# Patient Record
Sex: Female | Born: 1940
Health system: Southern US, Community
[De-identification: ages and names within clinical notes are randomized; demographics above are authoritative.]

## PROBLEM LIST (undated history)

## (undated) DIAGNOSIS — B019 Varicella without complication: Secondary | ICD-10-CM

## (undated) DIAGNOSIS — F411 Generalized anxiety disorder: Secondary | ICD-10-CM

## (undated) DIAGNOSIS — M329 Systemic lupus erythematosus, unspecified: Secondary | ICD-10-CM

## (undated) DIAGNOSIS — M1990 Unspecified osteoarthritis, unspecified site: Secondary | ICD-10-CM

## (undated) DIAGNOSIS — IMO0002 Reserved for concepts with insufficient information to code with codable children: Secondary | ICD-10-CM

## (undated) DIAGNOSIS — G43909 Migraine, unspecified, not intractable, without status migrainosus: Secondary | ICD-10-CM

## (undated) DIAGNOSIS — Z86718 Personal history of other venous thrombosis and embolism: Secondary | ICD-10-CM

## (undated) DIAGNOSIS — R7303 Prediabetes: Secondary | ICD-10-CM

## (undated) HISTORY — DX: Migraine, unspecified, not intractable, without status migrainosus: G43.909

## (undated) HISTORY — DX: Personal history of other venous thrombosis and embolism: Z86.718

## (undated) HISTORY — PX: CHOLECYSTECTOMY: SHX55

## (undated) HISTORY — DX: Varicella without complication: B01.9

## (undated) HISTORY — DX: Reserved for concepts with insufficient information to code with codable children: IMO0002

## (undated) HISTORY — DX: Generalized anxiety disorder: F41.1

## (undated) HISTORY — DX: Unspecified osteoarthritis, unspecified site: M19.90

## (undated) HISTORY — DX: Systemic lupus erythematosus, unspecified: M32.9

---

## 1971-03-09 HISTORY — PX: DILATION AND CURETTAGE OF UTERUS: SHX78

## 1972-03-08 HISTORY — PX: GALLBLADDER SURGERY: SHX652

## 1998-01-06 ENCOUNTER — Other Ambulatory Visit: Admission: RE | Admit: 1998-01-06 | Discharge: 1998-01-06 | Payer: Self-pay | Admitting: Obstetrics & Gynecology

## 1998-01-17 ENCOUNTER — Ambulatory Visit: Admission: RE | Admit: 1998-01-17 | Discharge: 1998-01-17 | Payer: Self-pay | Admitting: Internal Medicine

## 1998-03-08 HISTORY — PX: BACK SURGERY: SHX140

## 1998-04-08 ENCOUNTER — Ambulatory Visit: Admission: RE | Admit: 1998-04-08 | Discharge: 1998-04-09 | Payer: Self-pay | Admitting: Obstetrics & Gynecology

## 1998-07-01 ENCOUNTER — Other Ambulatory Visit: Admission: RE | Admit: 1998-07-01 | Discharge: 1998-07-01 | Payer: Self-pay | Admitting: Obstetrics & Gynecology

## 1998-12-22 ENCOUNTER — Ambulatory Visit: Admission: RE | Admit: 1998-12-22 | Discharge: 1998-12-22 | Payer: Self-pay | Admitting: Orthopaedic Surgery

## 1999-01-01 ENCOUNTER — Ambulatory Visit (HOSPITAL_COMMUNITY): Admission: RE | Admit: 1999-01-01 | Discharge: 1999-01-02 | Payer: Self-pay | Admitting: Orthopaedic Surgery

## 1999-04-07 ENCOUNTER — Other Ambulatory Visit: Admission: RE | Admit: 1999-04-07 | Discharge: 1999-04-07 | Payer: Self-pay | Admitting: Obstetrics & Gynecology

## 2000-05-23 ENCOUNTER — Other Ambulatory Visit: Admission: RE | Admit: 2000-05-23 | Discharge: 2000-05-23 | Payer: Self-pay | Admitting: Obstetrics & Gynecology

## 2001-07-06 ENCOUNTER — Encounter: Admission: RE | Admit: 2001-07-06 | Discharge: 2001-07-06 | Payer: Self-pay | Admitting: Internal Medicine

## 2001-07-06 ENCOUNTER — Encounter: Payer: Self-pay | Admitting: Internal Medicine

## 2001-07-13 ENCOUNTER — Encounter: Payer: Self-pay | Admitting: Internal Medicine

## 2001-07-13 ENCOUNTER — Encounter: Admission: RE | Admit: 2001-07-13 | Discharge: 2001-07-13 | Payer: Self-pay | Admitting: Internal Medicine

## 2001-10-27 ENCOUNTER — Other Ambulatory Visit: Admission: RE | Admit: 2001-10-27 | Discharge: 2001-10-27 | Payer: Self-pay | Admitting: Obstetrics & Gynecology

## 2002-01-01 ENCOUNTER — Encounter: Payer: Self-pay | Admitting: Rheumatology

## 2002-01-01 ENCOUNTER — Encounter: Admission: RE | Admit: 2002-01-01 | Discharge: 2002-01-01 | Payer: Self-pay | Admitting: Rheumatology

## 2002-04-30 ENCOUNTER — Emergency Department (HOSPITAL_COMMUNITY): Admission: EM | Admit: 2002-04-30 | Discharge: 2002-04-30 | Payer: Self-pay | Admitting: Emergency Medicine

## 2002-08-09 ENCOUNTER — Encounter: Admission: RE | Admit: 2002-08-09 | Discharge: 2002-08-09 | Payer: Self-pay | Admitting: Internal Medicine

## 2002-08-09 ENCOUNTER — Encounter: Payer: Self-pay | Admitting: Internal Medicine

## 2003-02-20 ENCOUNTER — Ambulatory Visit (HOSPITAL_COMMUNITY): Admission: RE | Admit: 2003-02-20 | Discharge: 2003-02-20 | Payer: Self-pay | Admitting: Gastroenterology

## 2003-03-21 ENCOUNTER — Other Ambulatory Visit: Admission: RE | Admit: 2003-03-21 | Discharge: 2003-03-21 | Payer: Self-pay | Admitting: Obstetrics & Gynecology

## 2004-08-11 ENCOUNTER — Other Ambulatory Visit: Admission: RE | Admit: 2004-08-11 | Discharge: 2004-08-11 | Payer: Self-pay | Admitting: Obstetrics & Gynecology

## 2004-08-14 ENCOUNTER — Ambulatory Visit: Payer: Self-pay | Admitting: Gastroenterology

## 2005-11-18 ENCOUNTER — Encounter: Admission: RE | Admit: 2005-11-18 | Discharge: 2005-11-18 | Payer: Self-pay | Admitting: Obstetrics & Gynecology

## 2005-11-25 ENCOUNTER — Ambulatory Visit: Payer: Self-pay | Admitting: Gastroenterology

## 2006-08-19 ENCOUNTER — Encounter: Admission: RE | Admit: 2006-08-19 | Discharge: 2006-08-19 | Payer: Self-pay | Admitting: *Deleted

## 2006-11-25 ENCOUNTER — Encounter: Admission: RE | Admit: 2006-11-25 | Discharge: 2006-11-25 | Payer: Self-pay | Admitting: Obstetrics & Gynecology

## 2007-12-11 ENCOUNTER — Encounter: Admission: RE | Admit: 2007-12-11 | Discharge: 2007-12-11 | Payer: Self-pay | Admitting: Obstetrics & Gynecology

## 2007-12-15 ENCOUNTER — Encounter: Admission: RE | Admit: 2007-12-15 | Discharge: 2007-12-15 | Payer: Self-pay | Admitting: Obstetrics & Gynecology

## 2007-12-20 ENCOUNTER — Emergency Department (HOSPITAL_COMMUNITY): Admission: EM | Admit: 2007-12-20 | Discharge: 2007-12-20 | Payer: Self-pay | Admitting: Internal Medicine

## 2009-01-20 ENCOUNTER — Encounter: Admission: RE | Admit: 2009-01-20 | Discharge: 2009-01-20 | Payer: Self-pay | Admitting: Obstetrics & Gynecology

## 2010-03-29 ENCOUNTER — Encounter: Payer: Self-pay | Admitting: Family Medicine

## 2010-03-29 ENCOUNTER — Encounter: Payer: Self-pay | Admitting: Obstetrics & Gynecology

## 2010-03-31 ENCOUNTER — Encounter
Admission: RE | Admit: 2010-03-31 | Discharge: 2010-03-31 | Payer: Self-pay | Source: Home / Self Care | Attending: Internal Medicine | Admitting: Internal Medicine

## 2010-04-01 ENCOUNTER — Ambulatory Visit: Admit: 2010-04-01 | Payer: Self-pay | Admitting: Family Medicine

## 2010-07-24 NOTE — Assessment & Plan Note (Signed)
[facility]                           GASTROENTEROLOGY OFFICE NOTE   Tara Martin, Tara Martin                     MRN:          [id number]  DATE:11/25/2005                            DOB:          13-Oct-1940    Tara Martin has gastroparesis which requires p.r.n. Reglan 5 mg before meals  and at bedtime use. She is doing extremely well and has no GI or general  medical complaints at this time. She is up to date on her colonoscopy and  does not need this done for at least 7-8 more years.  She is maintaining her  weight normally and does follow a gastroparesis diet.   Her weight today was 147 pounds.  Her blood pressure is 118/62.  Pulse was  72 and regular.   A general physical exam was not performed.   IMPRESSION:  Idiopathic gastroparesis well-controlled with p.r.n. prokinetic  therapy.  The patient is status post cholecystectomy.  Her gastric emptying  scan was last done in December 2004.   RECOMMENDATION:  Gastroparesis diet reviewed and Reglan renewed.  We will  see her on a p.r.n. basis as needed.  She has regular checkup and lab data  per Dr. Darius Bump.                                   Tara Rea. Jarold Motto, MD, [place], [place]   DRP/MedQ  DD:  11/25/2005  DT:  11/26/2005  Job #:  [id number]   cc:   Darius Bump, M.D.

## 2010-08-14 ENCOUNTER — Emergency Department (HOSPITAL_BASED_OUTPATIENT_CLINIC_OR_DEPARTMENT_OTHER)
Admission: EM | Admit: 2010-08-14 | Discharge: 2010-08-15 | Discharge status: Against Medical Advice | Payer: Medicare Other | Attending: Emergency Medicine | Admitting: Emergency Medicine

## 2010-08-14 DIAGNOSIS — R51 Headache: Secondary | ICD-10-CM | POA: Insufficient documentation

## 2010-08-20 ENCOUNTER — Inpatient Hospital Stay (INDEPENDENT_AMBULATORY_CARE_PROVIDER_SITE_OTHER)
Admission: RE | Admit: 2010-08-20 | Discharge: 2010-08-20 | Disposition: A | Discharge status: Home / Self Care | Payer: Medicare Other | Source: Ambulatory Visit | Attending: Emergency Medicine | Admitting: Emergency Medicine

## 2010-08-20 DIAGNOSIS — S0990XA Unspecified injury of head, initial encounter: Secondary | ICD-10-CM

## 2010-10-23 ENCOUNTER — Other Ambulatory Visit: Payer: Self-pay | Admitting: Family Medicine

## 2010-10-23 DIAGNOSIS — R51 Headache: Secondary | ICD-10-CM

## 2010-10-27 ENCOUNTER — Inpatient Hospital Stay: Admission: RE | Admit: 2010-10-27 | Payer: Medicare Other | Source: Ambulatory Visit

## 2010-10-27 ENCOUNTER — Ambulatory Visit
Admission: RE | Admit: 2010-10-27 | Discharge: 2010-10-27 | Disposition: A | Discharge status: Home / Self Care | Payer: PRIVATE HEALTH INSURANCE | Source: Ambulatory Visit | Attending: Family Medicine | Admitting: Family Medicine

## 2010-10-27 DIAGNOSIS — R51 Headache: Secondary | ICD-10-CM

## 2010-12-08 LAB — CBC
HCT: 38.8
MCHC: 34
MCV: 92
RBC: 4.21
RDW: 12.4
WBC: 4

## 2010-12-08 LAB — D-DIMER, QUANTITATIVE: D-Dimer, Quant: 0.29

## 2010-12-08 LAB — DIFFERENTIAL
Basophils Relative: 1
Eosinophils Relative: 3
Lymphocytes Relative: 31

## 2010-12-08 LAB — BASIC METABOLIC PANEL
BUN: 12
CO2: 22
Calcium: 9
GFR calc non Af Amer: >60
Glucose, Bld: 101 — ABNORMAL HIGH
Sodium: 136

## 2010-12-08 LAB — POCT CARDIAC MARKERS
CKMB, poc: <1 — ABNORMAL LOW
Myoglobin, poc: 49.8

## 2012-03-09 LAB — HM MAMMOGRAPHY

## 2012-03-09 LAB — HM PAP SMEAR

## 2012-04-08 LAB — HM COLONOSCOPY

## 2013-04-09 ENCOUNTER — Encounter: Payer: Self-pay | Admitting: Family Medicine

## 2013-04-09 ENCOUNTER — Ambulatory Visit (INDEPENDENT_AMBULATORY_CARE_PROVIDER_SITE_OTHER): Payer: Medicare HMO | Admitting: Family Medicine

## 2013-04-09 ENCOUNTER — Other Ambulatory Visit: Payer: Self-pay

## 2013-04-09 VITALS — BP 110/82 | Temp 97.8°F | Ht 63.75 in | Wt 147.0 lb

## 2013-04-09 DIAGNOSIS — L723 Sebaceous cyst: Secondary | ICD-10-CM

## 2013-04-09 DIAGNOSIS — F411 Generalized anxiety disorder: Secondary | ICD-10-CM

## 2013-04-09 DIAGNOSIS — L720 Epidermal cyst: Secondary | ICD-10-CM

## 2013-04-09 DIAGNOSIS — R82998 Other abnormal findings in urine: Secondary | ICD-10-CM

## 2013-04-09 DIAGNOSIS — Z7189 Other specified counseling: Secondary | ICD-10-CM

## 2013-04-09 DIAGNOSIS — R8290 Unspecified abnormal findings in urine: Secondary | ICD-10-CM

## 2013-04-09 DIAGNOSIS — Z7689 Persons encountering health services in other specified circumstances: Secondary | ICD-10-CM

## 2013-04-09 DIAGNOSIS — M329 Systemic lupus erythematosus, unspecified: Secondary | ICD-10-CM

## 2013-04-09 DIAGNOSIS — IMO0002 Reserved for concepts with insufficient information to code with codable children: Secondary | ICD-10-CM

## 2013-04-09 HISTORY — DX: Generalized anxiety disorder: F41.1

## 2013-04-09 MED ORDER — DOXYCYCLINE HYCLATE 100 MG PO TABS: 100.0000 mg | ORAL_TABLET | Freq: Two times a day (BID) | ORAL | Status: DC

## 2013-04-09 NOTE — Progress Notes (Addendum)
Chief Complaint  Patient presents with  . Establish Care  . lump on shoulder  . Urinary Tract Infection    HPI:  Tara Martin is here to establish care. Her prior PCP left [facility]. Last PCP and physical: 1 year ago  Bump on L shoulder: -[facility] at [facility] dermatology -started about 3 weeks ago, mild itching and pain  ? UTI: -had labs at rheum and told to recheck urine because had leuks and may have infection  Has the following chronic problems and concerns today:  Patient Active Problem List   Diagnosis Date Noted  . Lupus - followed by rheum 04/09/2013  . Anxiety state, unspecified - sees Dr. Nori Riis in gyn and benzo prescribed there 04/09/2013   ROS: negative for report of fevers, unintentional weight loss, vision changes, vision loss, hearing loss or change, chest pain, sob, hemoptysis, melena, hematochezia, hematuria, genital discharge or lesions, falls, bleeding or bruising, loc, thoughts of suicide or self harm, memory loss   Past Medical History  Diagnosis Date  . Chicken pox   . Arthritis   . Lupus   . Migraines     Family History  Problem Relation Age of Onset  . Alcoholism    . Arthritis Mother   . Arthritis    . Hypertension    . Sudden death    . Diabetes Mother     History   Social History  . Marital Status: Married    Spouse Name: N/A    Number of Children: N/A  . Years of Education: N/A   Social History Main Topics  . Smoking status: Never Smoker   . Smokeless tobacco: None  . Alcohol Use: Yes     Comment: wine occ   . Drug Use: None  . Sexual Activity: None   Other Topics Concern  . 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 Elease Hashimoto      Spiritual Beliefs:       Lifestyle: no regular exercise             Current outpatient prescriptions:ALPRAZolam (XANAX) 0.5 MG tablet, 1/2 tablet prn anxiety, Disp: , Rfl: ;  diclofenac sodium (VOLTAREN) 1  % GEL, Apply topically 4 (four) times daily., Disp: , Rfl: ;  FINACEA 15 % cream, , Disp: , Rfl: ;  hydroxychloroquine (PLAQUENIL) 200 MG tablet, Take 200 mg by mouth daily., Disp: , Rfl: ;  doxycycline (VIBRA-TABS) 100 MG tablet, Take 1 tablet (100 mg total) by mouth 2 (two) times daily., Disp: 20 tablet, Rfl: 0  EXAM:  Filed Vitals:   04/09/13 1124  BP: 110/82  Temp: 97.8 F (36.6 C)    Body mass index is 25.44 kg/(m^2).  GENERAL: vitals reviewed and listed above, alert, oriented, appears well hydrated and in no acute distress  HEENT: atraumatic, conjunttiva clear, no obvious abnormalities on inspection of external nose and ears  NECK: no obvious masses on inspection  LUNGS: clear to auscultation bilaterally, no wheezes, rales or rhonchi, good air movement  CV: HRRR, no peripheral edema  MS: moves all extremities without noticeable abnormality  SKIN: boil or cyst on back   PSYCH: pleasant and cooperative, no obvious depression or anxiety  ASSESSMENT AND PLAN:  Discussed the following assessment and plan:  Lupus - followed by rheum  Anxiety state, unspecified - sees Dr. Nori Riis in gyn and benzo prescribed there  Encounter to  establish care  Abnormal urinalysis - Plan: Culture, Urine  Epidermoid cyst - Plan: doxycycline (VIBRA-TABS) 100 MG tablet   -We reviewed the PMH, PSH, FH, SH, Meds and Allergies. -We provided refills for any medications we will prescribe as needed. -We addressed current concerns per orders and patient instructions. -We have asked for records for pertinent exams, studies, vaccines and notes from previous providers. -We have advised patient to follow up per instructions below. -for what looks like an inflamed epidermoid cysts advised compresses, doxy in case of infection though less likely and follow up with her dermatologist if worsens or persists -for abnormal urine dip will get culture to ensure no infection -follow up for annual preventive exam     -Patient advised to return or notify a doctor immediately if symptoms worsen or persist or new concerns arise.  Patient Instructions  -We have ordered labs or studies at this visit. It can take up to 1-2 weeks for results and processing. We will contact you with instructions IF your results are abnormal. Normal results will be released to your [facility]. If you have not heard from [place] or can not find your results in [facility] in 2 weeks please contact our office.  -PLEASE SIGN UP FOR MYCHART TODAY   We recommend the following healthy lifestyle measures: - eat a healthy diet consisting of lots of vegetables, fruits, beans, nuts, seeds, healthy meats such as white chicken and fish and whole grains.  - avoid fried foods, fast food, processed foods, sodas, red meet and other fattening foods.  - get a least 150 minutes of aerobic exercise per week.   Use warm compresses twice daily on cyst and take antibiotic. If does not resolve or worsens see your dermatologist.  Follow up in: next several months for your medicare preventive exam      Lucretia Kern.

## 2013-04-09 NOTE — Progress Notes (Signed)
Pre visit review using our clinic review tool, if applicable. No additional management support is needed unless otherwise documented below in the visit note. 

## 2013-04-09 NOTE — Patient Instructions (Signed)
-  We have ordered labs or studies at this visit. It can take up to 1-2 weeks for results and processing. We will contact you with instructions IF your results are abnormal. Normal results will be released to your [facility]. If you have not heard from [place] or can not find your results in [facility] in 2 weeks please contact our office.  -PLEASE SIGN UP FOR MYCHART TODAY   We recommend the following healthy lifestyle measures: - eat a healthy diet consisting of lots of vegetables, fruits, beans, nuts, seeds, healthy meats such as white chicken and fish and whole grains.  - avoid fried foods, fast food, processed foods, sodas, red meet and other fattening foods.  - get a least 150 minutes of aerobic exercise per week.   Use warm compresses twice daily on cyst and take antibiotic. If does not resolve or worsens see your dermatologist.  Follow up in: next several months for your medicare preventive exam

## 2013-04-10 LAB — URINE CULTURE
COLONY COUNT: NO GROWTH
ORGANISM ID, BACTERIA: NO GROWTH

## 2013-04-30 ENCOUNTER — Telehealth: Payer: Self-pay | Admitting: Family Medicine

## 2013-04-30 NOTE — Telephone Encounter (Signed)
Pt needs a receipts for her visit on 04/09/13 w/ Dr Maudie Mercury. P's needs a copy of the receipt for copay. fax copy  [phone] Or email to  [email or web address]

## 2013-05-21 NOTE — Telephone Encounter (Signed)
Emailed and faxed copy of receipt.

## 2013-09-12 ENCOUNTER — Encounter: Payer: Self-pay | Admitting: Gastroenterology

## 2013-09-18 ENCOUNTER — Encounter: Payer: Self-pay | Admitting: Family Medicine

## 2013-09-18 ENCOUNTER — Ambulatory Visit (INDEPENDENT_AMBULATORY_CARE_PROVIDER_SITE_OTHER): Payer: Medicare HMO | Admitting: Family Medicine

## 2013-09-18 VITALS — BP 102/70 | HR 66 | Temp 98.0°F | Ht 63.75 in | Wt 149.0 lb

## 2013-09-18 DIAGNOSIS — IMO0001 Reserved for inherently not codable concepts without codable children: Secondary | ICD-10-CM

## 2013-09-18 DIAGNOSIS — M791 Myalgia, unspecified site: Secondary | ICD-10-CM

## 2013-09-18 MED ORDER — CYCLOBENZAPRINE HCL 5 MG PO TABS: 5.0000 mg | ORAL_TABLET | Freq: Three times a day (TID) | ORAL | Status: DC | PRN

## 2013-09-18 NOTE — Progress Notes (Signed)
Pre visit review using our clinic review tool, if applicable. No additional management support is needed unless otherwise documented below in the visit note. 

## 2013-09-18 NOTE — Progress Notes (Signed)
No chief complaint on file.   HPI:  Acute visit for:  1) R shoulder pain: -started yesterday morning - just woke up with this after gardening a lot and shoveling -a new patient to me in 04/2013 with PMH lupus, arthritis and anxiety - she was supposed to follow up for her annual medicare exam after her last visit 5 months ago but did not - reports she does have a physical scheduled -she reports: pain is in R upper back and shoulder and did have some in R pec muscle yesterday too, intermittent, heat and stretching seemed to help it - mild now in upper R back -denies: SOB, L chest pain, cough, fevers  ROS: See pertinent positives and negatives per HPI.  Past Medical History  Diagnosis Date  . Chicken pox   . Arthritis   . Lupus   . Migraines   . Anxiety state, unspecified - sees Dr. Nori Riis in gyn and benzo prescribed there 04/09/2013    Past Surgical History  Procedure Laterality Date  . Gallbladder surgery  1974  . Back surgery  2000    Family History  Problem Relation Age of Onset  . Alcoholism    . Arthritis Mother   . Arthritis    . Hypertension    . Sudden death    . Diabetes Mother     History   Social History  . Marital Status: Married    Spouse Name: N/A    Number of Children: N/A  . Years of Education: N/A   Social History Main Topics  . Smoking status: Never Smoker   . Smokeless tobacco: Not on file  . Alcohol Use: Yes     Comment: wine occ   . Drug Use: Not on file  . Sexual Activity: Not on file   Other Topics Concern  . 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 Elease Hashimoto      Spiritual Beliefs:       Lifestyle: no regular exercise             Current outpatient prescriptions:ALPRAZolam (XANAX) 0.5 MG tablet, 1/2 tablet prn anxiety, Disp: , Rfl: ;  hydroxychloroquine (PLAQUENIL) 200 MG tablet, Take 200 mg by mouth daily., Disp: , Rfl: ;  cyclobenzaprine  (FLEXERIL) 5 MG tablet, Take 1 tablet (5 mg total) by mouth 3 (three) times daily as needed for muscle spasms., Disp: 15 tablet, Rfl: 0  EXAM:  Filed Vitals:   09/18/13 1617  BP: 102/70  Pulse: 66  Temp: 98 F (36.7 C)    Body mass index is 25.78 kg/(m^2).  GENERAL: vitals reviewed and listed above, alert, oriented, appears well hydrated and in no acute distress  HEENT: atraumatic, conjunttiva clear, no obvious abnormalities on inspection of external nose and ears  NECK: no obvious masses on inspection  LUNGS: clear to auscultation bilaterally, no wheezes, rales or rhonchi, good air movement  CV: HRRR, no peripheral edema  MS: moves all extremities without noticeable abnormality TTP in R rhomboid and trap and R pec muscles mild  PSYCH: pleasant and cooperative, no obvious depression or anxiety  ASSESSMENT AND PLAN:  Discussed the following assessment and plan:  Muscle pain - Plan: cyclobenzaprine (FLEXERIL) 5 MG tablet  -we discussed possible serious and likely etiologies, workup and treatment, treatment risks and return precautions - cardiac etiology very unlikely but offered ESE - she declined this -  after this discussion, Caelynn opted for flexeril, HEP, heat, tylenol -follow up as scheduled -of course, we advised Shelanda  to return or notify a doctor immediately if symptoms worsen or persist or new concerns arise.   -Patient advised to return or notify a doctor immediately if symptoms worsen or persist or new concerns arise.  There are no Patient Instructions on file for this visit.   Colin Benton R.

## 2013-10-02 ENCOUNTER — Encounter: Payer: Self-pay | Admitting: *Deleted

## 2013-10-11 ENCOUNTER — Encounter: Payer: Medicare HMO | Admitting: Gastroenterology

## 2013-10-15 ENCOUNTER — Other Ambulatory Visit: Payer: Medicare HMO

## 2013-10-22 ENCOUNTER — Encounter: Payer: Self-pay | Admitting: Family Medicine

## 2013-10-22 ENCOUNTER — Ambulatory Visit (INDEPENDENT_AMBULATORY_CARE_PROVIDER_SITE_OTHER): Payer: Medicare HMO | Admitting: Family Medicine

## 2013-10-22 VITALS — BP 112/70 | HR 65 | Temp 97.8°F | Ht 64.0 in | Wt 142.5 lb

## 2013-10-22 DIAGNOSIS — M329 Systemic lupus erythematosus, unspecified: Secondary | ICD-10-CM

## 2013-10-22 DIAGNOSIS — Z0000 Encounter for general adult medical examination without abnormal findings: Secondary | ICD-10-CM

## 2013-10-22 DIAGNOSIS — Z23 Encounter for immunization: Secondary | ICD-10-CM

## 2013-10-22 LAB — COMPREHENSIVE METABOLIC PANEL
ALT: 22 U/L (ref 0–35)
AST: 26 U/L (ref 0–37)
Albumin: 4 g/dL (ref 3.5–5.2)
Alkaline Phosphatase: 75 U/L (ref 39–117)
BUN: 11 mg/dL (ref 6–23)
CO2: 26 mEq/L (ref 19–32)
Calcium: 9.4 mg/dL (ref 8.4–10.5)
Chloride: 105 mEq/L (ref 96–112)
Creatinine, Ser: 1 mg/dL (ref 0.4–1.2)
GFR: 57.28 mL/min — ABNORMAL LOW (ref >60.00)
Glucose, Bld: 87 mg/dL (ref 70–99)
Potassium: 4.1 mEq/L (ref 3.5–5.1)
Sodium: 139 mEq/L (ref 135–145)
Total Bilirubin: 0.6 mg/dL (ref 0.2–1.2)
Total Protein: 7.2 g/dL (ref 6.0–8.3)

## 2013-10-22 LAB — URINALYSIS, ROUTINE W REFLEX MICROSCOPIC
Bilirubin Urine: NEGATIVE
HGB URINE DIPSTICK: NEGATIVE
KETONES UR: NEGATIVE
Leukocytes, UA: NEGATIVE
Nitrite: NEGATIVE
PH: 6.5 (ref 5.0–8.0)
RBC / HPF: NONE SEEN (ref 0–?)
SPECIFIC GRAVITY, URINE: 1.01 (ref 1.000–1.030)
Total Protein, Urine: NEGATIVE
URINE GLUCOSE: NEGATIVE
Urobilinogen, UA: 0.2 (ref 0.0–1.0)
WBC, UA: NONE SEEN (ref 0–?)

## 2013-10-22 LAB — CBC WITH DIFFERENTIAL/PLATELET
Basophils Absolute: 0 10*3/uL (ref 0.0–0.1)
Basophils Relative: 1 % (ref 0.0–3.0)
Eosinophils Absolute: 0 10*3/uL (ref 0.0–0.7)
Eosinophils Relative: 1.5 % (ref 0.0–5.0)
HCT: 37.8 % (ref 36.0–46.0)
HEMOGLOBIN: 12.5 g/dL (ref 12.0–15.0)
LYMPHS ABS: 1 10*3/uL (ref 0.7–4.0)
Lymphocytes Relative: 30.3 % (ref 12.0–46.0)
MCHC: 33.1 g/dL (ref 30.0–36.0)
MCV: 90.4 fl (ref 78.0–100.0)
Monocytes Absolute: 0.4 10*3/uL (ref 0.1–1.0)
Monocytes Relative: 11.1 % (ref 3.0–12.0)
NEUTROS ABS: 1.8 10*3/uL (ref 1.4–7.7)
Neutrophils Relative %: 56.1 % (ref 43.0–77.0)
Platelets: 185 10*3/uL (ref 150.0–400.0)
RBC: 4.18 Mil/uL (ref 3.87–5.11)
RDW: 12.6 % (ref 11.5–15.5)
WBC: 3.3 10*3/uL — ABNORMAL LOW (ref 4.0–10.5)

## 2013-10-22 LAB — LIPID PANEL
CHOLESTEROL: 181 mg/dL (ref 0–200)
HDL: 33.3 mg/dL — ABNORMAL LOW (ref >39.00)
LDL CALC: 122 mg/dL — AB (ref 0–99)
NonHDL: 147.7
Total CHOL/HDL Ratio: 5
Triglycerides: 128 mg/dL (ref 0.0–149.0)
VLDL: 25.6 mg/dL (ref 0.0–40.0)

## 2013-10-22 LAB — HEMOGLOBIN A1C: Hgb A1c MFr Bld: 6 % (ref 4.6–6.5)

## 2013-10-22 NOTE — Progress Notes (Signed)
Medicare Annual Preventive Care Visit  (initial annual wellness or annual wellness exam)  Arthritis/Lupus: -wants labs for rhuem drawn here - CBC, CMP, UA - fax to [facility], Dr. Luanna Salk -stable   ROS: negative for report of fevers, unintentional weight loss, vision changes, vision loss, hearing loss or change, chest pain, sob, hemoptysis, melena, hematochezia, hematuria, genital discharge or lesions, falls, bleeding or bruising, loc, thoughts of suicide or self harm, memory loss  1.) Patient-completed health risk assessment  - completed and reviewed, see scanned documentation  2.) Review of Medical History: -PMH, PSH, Family History and current specialty and care providers reviewed and updated and listed below  - see scanned in document in chart and below  Past Medical History  Diagnosis Date  . Chicken pox   . Arthritis   . Lupus   . Migraines   . Anxiety state, unspecified - sees Dr. Nori Riis in gyn and benzo prescribed there 04/09/2013    Past Surgical History  Procedure Laterality Date  . Gallbladder surgery  1974  . Back surgery  2000    History   Social History  . Marital Status: Married    Spouse Name: N/A    Number of Children: N/A  . Years of Education: N/A   Occupational History  . Not on file.   Social History Main Topics  . Smoking status: Never Smoker   . Smokeless tobacco: Not on file  . Alcohol Use: Yes     Comment: wine occ   . Drug Use: Not on file  . Sexual Activity: Not on file   Other Topics Concern  . 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 Elease Hashimoto      Spiritual Beliefs:       Lifestyle: no regular exercise             The patient has a family history of  3.) Review of functional ability and level of safety:  Any difficulty hearing? NO  History of falling?  NO  Any trouble with IADLs - using a phone, using transportation, grocery  shopping, preparing meals, doing housework, doing laundry, taking medications and managing money?  NO  Advance Directives? YES - living will  See summary of recommendations in Patient Instructions below.  4.) Physical Exam Filed Vitals:   10/22/13 0946  BP: 112/70  Pulse: 65  Temp: 97.8 F (36.6 C)   Estimated body mass index is 24.45 kg/(m^2) as calculated from the following:   Height as of this encounter: 5\' 4"  (1.626 m).   Weight as of this encounter: 142 lb 8 oz (64.638 kg).  EKG (optional): deferred  General: alert, appear well hydrated and in no acute distress  HEENT: visual acuity grossly intact  NECK: normal, no thyroid masses palpated  CV: HRRR  Lungs: CTA bilaterally  Psych: pleasant and cooperative, no obvious depression or anxiety  Mini Cog: 1. Patient instructed to listen carefully and repeat the following: [place]  2. Clock drawing test was administered: NORMAL       3. Recall of three words: 3/3  Number of words recalled? 3/3 Patient Score: NEG   See patient instructions for recommendations.  Education and counseling regarding the above review of health provided with a plan for the following: -see scanned patient completed form for further details -fall prevention strategies discussed  -healthy lifestyle discussed -  importance and resources for completing advanced directives discussed -see patient instructions below for any other recommendations provided  4)The following written screening schedule of preventive measures were reviewed with assessment and plan made per below, orders and patient instructions:      AAA screening: N/A     Alcohol screening: done     Obesity Screening and counseling: N/A     STI screening: declined     Tobacco Screening: done       Pneumococcal (PPSV23 -one dose after 64, one before if risk factors), influenza yearly and hepatitis B vaccines (if high risk - end stage renal disease, IV drugs,  homosexual men, live in home for mentally retarded, hemophilia receiving factors) ASSESSMENT/PLAN: done, prevnar 66 today      Screening mammograph (yearly if >40) ASSESSMENT/PLAN: done      Screening Pap smear/pelvic exam (q2 years) ASSESSMENT/PLAN: does with gyn      Prostate cancer screening ASSESSMENT/PLAN: N/A      Colorectal cancer screening (FOBT yearly or flex sig q4y or colonoscopy q10y or barium enema q4y) ASSESSMENT/PLAN: has appointment next week for her screening colonoscopy      Diabetes outpatient self-management training services ASSESSMENT/PLAN: n/a      Bone mass measurements(covered q2y if indicated - estrogen def, osteoporosis, hyperparathyroid, vertebral abnormalities, osteoporosis or steroids) ASSESSMENT/PLAN: DEXA - done with her Rheumatologist recently      Screening for glaucoma(q1y if high risk - diabetes, FH, AA and > 50 or hispanic and > 65) ASSESSMENT/PLAN: sees opthomologist      Medical nutritional therapy for individuals with diabetes or renal disease ASSESSMENT/PLAN: n/a      Cardiovascular screening blood tests (lipids q5y) ASSESSMENT/PLAN: doing today      Diabetes screening tests ASSESSMENT/PLAN: doing today   7.) Summary: -risk factors and conditions per above assessment were discussed and treatment, recommendations and referrals were offered per documentation above and orders and patient instructions.  Medicare annual wellness visit, initial - Plan: Lipid Panel, Hemoglobin A1c  Lupus - followed by rheum - Plan: CBC with Differential, CMP, Urinalysis with Reflex Microscopic -ordered per rheum orders and will fax results to her rheumatologist for management  Tdap today Has not had shingles vaccine - wants to check on this first with insurance prevnar 13 today    Patient Instructions  -We have ordered labs or studies at this visit. It can take up to 1-2 weeks for results and processing. We will contact you with instructions IF your  results are abnormal. Normal results will be released to your [facility]. If you have not heard from [place] or can not find your results in [facility] in 2 weeks please contact our office. -Please remind [place] to fax your labs per Dr. Marye Round orders to her -check on shingles vaccine  We recommend the following healthy lifestyle measures: - eat a healthy diet consisting of lots of vegetables, fruits, beans, nuts, seeds, healthy meats such as white chicken and fish and whole grains.  - avoid fried foods, fast food, processed foods, sodas, red meet and other fattening foods.  - get a least 150 minutes of aerobic exercise per week.   Follow up in: 1 year or as needed

## 2013-10-22 NOTE — Progress Notes (Signed)
Pre visit review using our clinic review tool, if applicable. No additional management support is needed unless otherwise documented below in the visit note. 

## 2013-10-22 NOTE — Addendum Note (Signed)
Addended by: Agnes Lawrence on: 10/22/2013 10:54 AM   Modules accepted: Orders

## 2013-10-22 NOTE — Patient Instructions (Signed)
-  We have ordered labs or studies at this visit. It can take up to 1-2 weeks for results and processing. We will contact you with instructions IF your results are abnormal. Normal results will be released to your [facility]. If you have not heard from [place] or can not find your results in [facility] in 2 weeks please contact our office. -Please remind [place] to fax your labs per Dr. Marye Round orders to her -check on shingles vaccine  We recommend the following healthy lifestyle measures: - eat a healthy diet consisting of lots of vegetables, fruits, beans, nuts, seeds, healthy meats such as white chicken and fish and whole grains.  - avoid fried foods, fast food, processed foods, sodas, red meet and other fattening foods.  - get a least 150 minutes of aerobic exercise per week.   Follow up in: 1 year or as needed

## 2013-10-23 ENCOUNTER — Ambulatory Visit (AMBULATORY_SURGERY_CENTER): Payer: Self-pay | Admitting: *Deleted

## 2013-10-23 VITALS — Ht 64.0 in | Wt 143.2 lb

## 2013-10-23 DIAGNOSIS — Z1211 Encounter for screening for malignant neoplasm of colon: Secondary | ICD-10-CM

## 2013-10-23 MED ORDER — MOVIPREP 100 G PO SOLR: ORAL | Status: DC

## 2013-10-23 NOTE — Progress Notes (Signed)
No allergies to eggs or soy. No problems with anesthesia.  Pt given Emmi instructions for colonoscopy  No oxygen use  No diet drug use  

## 2013-10-25 ENCOUNTER — Encounter: Payer: Self-pay | Admitting: *Deleted

## 2013-10-25 ENCOUNTER — Other Ambulatory Visit: Payer: Self-pay | Admitting: *Deleted

## 2013-10-25 DIAGNOSIS — D729 Disorder of white blood cells, unspecified: Secondary | ICD-10-CM

## 2013-10-30 ENCOUNTER — Encounter: Payer: Self-pay | Admitting: Gastroenterology

## 2013-11-13 ENCOUNTER — Ambulatory Visit (AMBULATORY_SURGERY_CENTER): Payer: Medicare HMO | Admitting: Gastroenterology

## 2013-11-13 ENCOUNTER — Encounter: Payer: Self-pay | Admitting: Gastroenterology

## 2013-11-13 VITALS — BP 109/62 | HR 57 | Temp 96.8°F | Resp 20 | Ht 64.0 in | Wt 143.0 lb

## 2013-11-13 DIAGNOSIS — Z1211 Encounter for screening for malignant neoplasm of colon: Secondary | ICD-10-CM

## 2013-11-13 MED ORDER — SODIUM CHLORIDE 0.9 % IV SOLN: 500.0000 mL | INTRAVENOUS | Status: DC

## 2013-11-13 NOTE — Patient Instructions (Signed)
YOU HAD AN ENDOSCOPIC PROCEDURE TODAY AT [facility]: Refer to the procedure report that was given to you for any specific questions about what was found during the examination.  If the procedure report does not answer your questions, please call your gastroenterologist to clarify.  If you requested that your care partner not be given the details of your procedure findings, then the procedure report has been included in a sealed envelope for you to review at your convenience later.  YOU SHOULD EXPECT: Some feelings of bloating in the abdomen. Passage of more gas than usual.  Walking can help get rid of the air that was put into your GI tract during the procedure and reduce the bloating. If you had a lower endoscopy (such as a colonoscopy or flexible sigmoidoscopy) you may notice spotting of blood in your stool or on the toilet paper. If you underwent a bowel prep for your procedure, then you may not have a normal bowel movement for a few days.  DIET: Your first meal following the procedure should be a light meal and then it is ok to progress to your normal diet.  A half-sandwich or bowl of soup is an example of a good first meal.  Heavy or fried foods are harder to digest and may make you feel nauseous or bloated.  Likewise meals heavy in dairy and vegetables can cause extra gas to form and this can also increase the bloating.  Drink plenty of fluids but you should avoid alcoholic beverages for 24 hours.  ACTIVITY: Your care partner should take you home directly after the procedure.  You should plan to take it easy, moving slowly for the rest of the day.  You can resume normal activity the day after the procedure however you should NOT DRIVE or use heavy machinery for 24 hours (because of the sedation medicines used during the test).    SYMPTOMS TO REPORT IMMEDIATELY: A gastroenterologist can be reached at any hour.  During normal business hours, 8:30 AM to 5:00 PM Monday through Friday,  call [phone].  After hours and on weekends, please call the GI answering service at [phone] who will take a message and have the physician on call contact you.   Following lower endoscopy (colonoscopy or flexible sigmoidoscopy):  Excessive amounts of blood in the stool  Significant tenderness or worsening of abdominal pains  Swelling of the abdomen that is new, acute  Fever of 100F or higher  FOLLOW UP: Our staff will call the home number listed on your records the next business day following your procedure to check on you and address any questions or concerns that you may have at that time regarding the information given to you following your procedure. This is a courtesy call and so if there is no answer at the home number and we have not heard from you through the emergency physician on call, we will assume that you have returned to your regular daily activities without incident.  SIGNATURES/CONFIDENTIALITY: You and/or your care partner have signed paperwork which will be entered into your electronic medical record.  These signatures attest to the fact that that the information above on your After Visit Summary has been reviewed and is understood.  Full responsibility of the confidentiality of this discharge information lies with you and/or your care-partner.  Continue your normal medications  Please read over handouts about diverticulosis and high fiber diets  Push fluids  Follow up colonoscopy only as needed

## 2013-11-13 NOTE — Progress Notes (Signed)
Report to PACU, RN, vss, BBS= Clear.  

## 2013-11-13 NOTE — Op Note (Signed)
[place]  [address]. [place], [address]   COLONOSCOPY PROCEDURE REPORT  PATIENT: Tara Martin, Tara Martin  MR#: [id number] BIRTHDATE: 16-Jun-1940 , 72  yrs. old GENDER: Female ENDOSCOPIST: Ladene Artist, MD, [facility] REFERRED GO:TLXBWI Maudie Mercury, D.O. PROCEDURE DATE:  11/13/2013 PROCEDURE:   Colonoscopy, screening First Screening Colonoscopy - Avg.  risk and is 50 yrs.  old or older - No.  Prior Negative Screening - Now for repeat screening. 10 or more years since last screening  History of Adenoma - Now for follow-up colonoscopy & has been > or = to 3 yrs.  N/A  Polyps Removed Today? No.  Recommend repeat exam, <10 yrs? No. ASA CLASS:   Class II INDICATIONS:average risk screening. MEDICATIONS: MAC sedation, administered by CRNA and propofol (Diprivan) 200mg  IV DESCRIPTION OF PROCEDURE:   After the risks benefits and alternatives of the procedure were thoroughly explained, informed consent was obtained.  A digital rectal exam revealed no abnormalities of the rectum.   The LB [id number]  endoscope was introduced through the anus and advanced to the cecum, which was identified by both the appendix and ileocecal valve. No adverse events experienced.   The quality of the prep was good, using MoviPrep  The instrument was then slowly withdrawn as the colon was fully examined.  COLON FINDINGS: Mild diverticulosis was noted in the sigmoid colon. The colon was otherwise normal.  There was no diverticulosis, inflammation, polyps or cancers unless previously stated. Retroflexed views revealed no abnormalities. The time to cecum=2 minutes 16 seconds.  Withdrawal time=8 minutes 38 seconds.  The scope was withdrawn and the procedure completed.  COMPLICATIONS: There were no complications.  ENDOSCOPIC IMPRESSION: 1.   Mild diverticulosis in the sigmoid colon 2.   The colon was otherwise normal  RECOMMENDATIONS: 1.  High fiber diet with liberal fluid intake. 2.  Given your  age, you will not need another colonoscopy for colon cancer screening or polyp surveillance.  These types of tests usually stop around the age 54.  eSigned:  Ladene Artist, MD, [facility] 11/13/2013 11:19 AM

## 2013-11-14 ENCOUNTER — Telehealth: Payer: Self-pay | Admitting: *Deleted

## 2013-11-14 NOTE — Telephone Encounter (Signed)
  Follow up Call-  Call back number 11/13/2013  Post procedure Call Back phone  # [phone]  Permission to leave phone message Yes     Patient questions:  Do you have a fever, pain , or abdominal swelling? Yes.   Pain Score  3 *  Have you tolerated food without any problems? Yes.    Have you been able to return to your normal activities? Yes.    Do you have any questions about your discharge instructions: Diet   No. Medications  No. Follow up visit  No.  Do you have questions or concerns about your Care? No.  Actions: * If pain score is 4 or above: No action needed, pain <4.  Pt. Feels that she is having discomfort related to trapped gas.  She is afebrile and able to doe her normal Activities.  Will try waklking to expel air. Wil also try lying on left side and drinking warm liquids.  Encouraged to call office if discomfort Increases if she feels the need.

## 2013-11-30 ENCOUNTER — Other Ambulatory Visit (INDEPENDENT_AMBULATORY_CARE_PROVIDER_SITE_OTHER): Payer: Medicare HMO

## 2013-11-30 ENCOUNTER — Encounter: Payer: Self-pay | Admitting: *Deleted

## 2013-11-30 DIAGNOSIS — D729 Disorder of white blood cells, unspecified: Secondary | ICD-10-CM

## 2013-11-30 LAB — CBC WITH DIFFERENTIAL/PLATELET
Basophils Absolute: 0 10*3/uL (ref 0.0–0.1)
Basophils Relative: 0.5 % (ref 0.0–3.0)
EOS PCT: 1.5 % (ref 0.0–5.0)
Eosinophils Absolute: 0.1 10*3/uL (ref 0.0–0.7)
HEMATOCRIT: 36.8 % (ref 36.0–46.0)
Hemoglobin: 12.4 g/dL (ref 12.0–15.0)
LYMPHS ABS: 1.2 10*3/uL (ref 0.7–4.0)
Lymphocytes Relative: 31.7 % (ref 12.0–46.0)
MCHC: 33.8 g/dL (ref 30.0–36.0)
MCV: 88.3 fl (ref 78.0–100.0)
MONOS PCT: 11.4 % (ref 3.0–12.0)
Monocytes Absolute: 0.4 10*3/uL (ref 0.1–1.0)
NEUTROS PCT: 54.9 % (ref 43.0–77.0)
Neutro Abs: 2 10*3/uL (ref 1.4–7.7)
Platelets: 191 10*3/uL (ref 150.0–400.0)
RBC: 4.16 Mil/uL (ref 3.87–5.11)
RDW: 12.9 % (ref 11.5–15.5)
WBC: 3.7 10*3/uL — AB (ref 4.0–10.5)

## 2014-01-01 ENCOUNTER — Telehealth: Payer: Self-pay | Admitting: Family Medicine

## 2014-01-01 NOTE — Telephone Encounter (Signed)
[employer] needs ndc code for tetnas. They will fax over request

## 2014-02-11 ENCOUNTER — Encounter (HOSPITAL_COMMUNITY): Payer: Self-pay | Admitting: *Deleted

## 2014-02-11 ENCOUNTER — Emergency Department (HOSPITAL_COMMUNITY): Payer: Medicare HMO

## 2014-02-11 ENCOUNTER — Inpatient Hospital Stay (HOSPITAL_COMMUNITY): Payer: Medicare HMO

## 2014-02-11 ENCOUNTER — Inpatient Hospital Stay (HOSPITAL_COMMUNITY)
Admission: EM | Admit: 2014-02-11 | Discharge: 2014-02-12 | DRG: 313 | Disposition: A | Discharge status: Home / Self Care | Payer: Medicare HMO | Attending: Cardiology | Admitting: Cardiology

## 2014-02-11 DIAGNOSIS — R208 Other disturbances of skin sensation: Secondary | ICD-10-CM

## 2014-02-11 DIAGNOSIS — R079 Chest pain, unspecified: Secondary | ICD-10-CM

## 2014-02-11 DIAGNOSIS — F419 Anxiety disorder, unspecified: Secondary | ICD-10-CM | POA: Diagnosis present

## 2014-02-11 DIAGNOSIS — Z885 Allergy status to narcotic agent status: Secondary | ICD-10-CM | POA: Diagnosis not present

## 2014-02-11 DIAGNOSIS — R7309 Other abnormal glucose: Secondary | ICD-10-CM

## 2014-02-11 DIAGNOSIS — F411 Generalized anxiety disorder: Secondary | ICD-10-CM | POA: Diagnosis present

## 2014-02-11 DIAGNOSIS — R0789 Other chest pain: Secondary | ICD-10-CM

## 2014-02-11 DIAGNOSIS — Z880 Allergy status to penicillin: Secondary | ICD-10-CM | POA: Diagnosis not present

## 2014-02-11 DIAGNOSIS — G43909 Migraine, unspecified, not intractable, without status migrainosus: Secondary | ICD-10-CM | POA: Diagnosis present

## 2014-02-11 DIAGNOSIS — M329 Systemic lupus erythematosus, unspecified: Secondary | ICD-10-CM | POA: Diagnosis present

## 2014-02-11 DIAGNOSIS — I209 Angina pectoris, unspecified: Secondary | ICD-10-CM | POA: Diagnosis present

## 2014-02-11 DIAGNOSIS — M1990 Unspecified osteoarthritis, unspecified site: Secondary | ICD-10-CM | POA: Diagnosis present

## 2014-02-11 DIAGNOSIS — Z882 Allergy status to sulfonamides status: Secondary | ICD-10-CM | POA: Diagnosis not present

## 2014-02-11 DIAGNOSIS — R6884 Jaw pain: Secondary | ICD-10-CM | POA: Diagnosis present

## 2014-02-11 DIAGNOSIS — R200 Anesthesia of skin: Secondary | ICD-10-CM

## 2014-02-11 DIAGNOSIS — Z9049 Acquired absence of other specified parts of digestive tract: Secondary | ICD-10-CM | POA: Diagnosis present

## 2014-02-11 DIAGNOSIS — M549 Dorsalgia, unspecified: Secondary | ICD-10-CM

## 2014-02-11 DIAGNOSIS — R7303 Prediabetes: Secondary | ICD-10-CM | POA: Diagnosis present

## 2014-02-11 HISTORY — DX: Prediabetes: R73.03

## 2014-02-11 LAB — COMPREHENSIVE METABOLIC PANEL
ALT: 23 U/L (ref 0–35)
ANION GAP: 13 (ref 5–15)
AST: 27 U/L (ref 0–37)
Albumin: 3.8 g/dL (ref 3.5–5.2)
Alkaline Phosphatase: 79 U/L (ref 39–117)
BUN: 11 mg/dL (ref 6–23)
CALCIUM: 9.5 mg/dL (ref 8.4–10.5)
CO2: 24 meq/L (ref 19–32)
CREATININE: 0.81 mg/dL (ref 0.50–1.10)
Chloride: 102 mEq/L (ref 96–112)
GFR calc non Af Amer: 71 mL/min — ABNORMAL LOW (ref >90)
GFR, EST AFRICAN AMERICAN: 82 mL/min — AB (ref >90)
Glucose, Bld: 106 mg/dL — ABNORMAL HIGH (ref 70–99)
Potassium: 4.2 mEq/L (ref 3.7–5.3)
Sodium: 139 mEq/L (ref 137–147)
Total Bilirubin: 0.3 mg/dL (ref 0.3–1.2)
Total Protein: 7.4 g/dL (ref 6.0–8.3)

## 2014-02-11 LAB — TROPONIN I
Troponin I: <0.3 ng/mL (ref <0.30)
Troponin I: <0.3 ng/mL (ref <0.30)
Troponin I: <0.3 ng/mL (ref <0.30)

## 2014-02-11 LAB — CBC
HCT: 36.8 % (ref 36.0–46.0)
Hemoglobin: 12.2 g/dL (ref 12.0–15.0)
MCH: 29.5 pg (ref 26.0–34.0)
MCHC: 33.2 g/dL (ref 30.0–36.0)
MCV: 89.1 fL (ref 78.0–100.0)
PLATELETS: 169 10*3/uL (ref 150–400)
RBC: 4.13 MIL/uL (ref 3.87–5.11)
RDW: 13.3 % (ref 11.5–15.5)
WBC: 3.7 10*3/uL — ABNORMAL LOW (ref 4.0–10.5)

## 2014-02-11 LAB — BASIC METABOLIC PANEL
ANION GAP: 13 (ref 5–15)
BUN: 12 mg/dL (ref 6–23)
CALCIUM: 9.3 mg/dL (ref 8.4–10.5)
CO2: 22 mEq/L (ref 19–32)
Chloride: 104 mEq/L (ref 96–112)
Creatinine, Ser: 0.92 mg/dL (ref 0.50–1.10)
GFR, EST AFRICAN AMERICAN: 70 mL/min — AB (ref >90)
GFR, EST NON AFRICAN AMERICAN: 61 mL/min — AB (ref >90)
Glucose, Bld: 102 mg/dL — ABNORMAL HIGH (ref 70–99)
POTASSIUM: 4.2 meq/L (ref 3.7–5.3)
SODIUM: 139 meq/L (ref 137–147)

## 2014-02-11 LAB — CBC WITH DIFFERENTIAL/PLATELET
BASOS ABS: 0 10*3/uL (ref 0.0–0.1)
Basophils Relative: 1 % (ref 0–1)
EOS PCT: 1 % (ref 0–5)
Eosinophils Absolute: 0 10*3/uL (ref 0.0–0.7)
HCT: 38 % (ref 36.0–46.0)
Hemoglobin: 12.8 g/dL (ref 12.0–15.0)
Lymphocytes Relative: 28 % (ref 12–46)
Lymphs Abs: 1.1 10*3/uL (ref 0.7–4.0)
MCH: 29.8 pg (ref 26.0–34.0)
MCHC: 33.7 g/dL (ref 30.0–36.0)
MCV: 88.4 fL (ref 78.0–100.0)
MONO ABS: 0.3 10*3/uL (ref 0.1–1.0)
Monocytes Relative: 6 % (ref 3–12)
Neutro Abs: 2.7 10*3/uL (ref 1.7–7.7)
Neutrophils Relative %: 64 % (ref 43–77)
Platelets: 184 10*3/uL (ref 150–400)
RBC: 4.3 MIL/uL (ref 3.87–5.11)
RDW: 13.6 % (ref 11.5–15.5)
WBC: 4.1 10*3/uL (ref 4.0–10.5)

## 2014-02-11 LAB — MAGNESIUM: MAGNESIUM: 2 mg/dL (ref 1.5–2.5)

## 2014-02-11 LAB — PROTIME-INR
INR: 1.06 (ref 0.00–1.49)
Prothrombin Time: 13.9 seconds (ref 11.6–15.2)

## 2014-02-11 LAB — TSH: TSH: 4.01 u[IU]/mL (ref 0.350–4.500)

## 2014-02-11 LAB — APTT: aPTT: 32 seconds (ref 24–37)

## 2014-02-11 MED ORDER — ACETAMINOPHEN 325 MG PO TABS: 650.0000 mg | ORAL_TABLET | ORAL | Status: DC | PRN

## 2014-02-11 MED ORDER — LABETALOL HCL 5 MG/ML IV SOLN: 10.0000 mg | Freq: Once | INTRAVENOUS | Status: DC

## 2014-02-11 MED ORDER — NITROGLYCERIN 0.4 MG SL SUBL: 0.4000 mg | SUBLINGUAL_TABLET | SUBLINGUAL | Status: DC | PRN

## 2014-02-11 MED ORDER — ALPRAZOLAM 0.25 MG PO TABS: 0.2500 mg | ORAL_TABLET | Freq: Two times a day (BID) | ORAL | Status: DC | PRN

## 2014-02-11 MED ORDER — ASPIRIN 81 MG PO TABS: 81.0000 mg | ORAL_TABLET | Freq: Every day | ORAL | Status: DC

## 2014-02-11 MED ORDER — ASPIRIN 81 MG PO CHEW: 324.0000 mg | CHEWABLE_TABLET | ORAL | Status: DC

## 2014-02-11 MED ORDER — ATORVASTATIN CALCIUM 40 MG PO TABS
40.0000 mg | ORAL_TABLET | Freq: Every day | ORAL | Status: DC
  Filled 2014-02-11: qty 1

## 2014-02-11 MED ORDER — HEPARIN (PORCINE) IN NACL 100-0.45 UNIT/ML-% IJ SOLN
800.0000 [IU]/h | INTRAMUSCULAR | Status: DC
  Administered 2014-02-11: 800 [IU]/h via INTRAVENOUS
  Filled 2014-02-11: qty 250

## 2014-02-11 MED ORDER — IOHEXOL 350 MG/ML SOLN
100.0000 mL | Freq: Once | INTRAVENOUS | Status: AC | PRN
  Administered 2014-02-11: 100 mL via INTRAVENOUS

## 2014-02-11 MED ORDER — HEPARIN BOLUS VIA INFUSION
3500.0000 [IU] | Freq: Once | INTRAVENOUS | Status: AC
  Administered 2014-02-11: 3500 [IU] via INTRAVENOUS
  Filled 2014-02-11: qty 3500

## 2014-02-11 MED ORDER — ASPIRIN EC 81 MG PO TBEC: 81.0000 mg | DELAYED_RELEASE_TABLET | Freq: Every day | ORAL | Status: DC

## 2014-02-11 MED ORDER — ASPIRIN 300 MG RE SUPP: 300.0000 mg | RECTAL | Status: DC

## 2014-02-11 MED ORDER — ONDANSETRON HCL 4 MG/2ML IJ SOLN: 4.0000 mg | Freq: Four times a day (QID) | INTRAMUSCULAR | Status: DC | PRN

## 2014-02-11 NOTE — ED Notes (Signed)
Dr. Turner at bedside.

## 2014-02-11 NOTE — Plan of Care (Signed)
Problem: Consults Goal: Chest Pain Patient Education (See Patient Education module for education specifics.) Outcome: Completed/Met Date Met:  02/11/14  Problem: Phase I Progression Outcomes Goal: Aspirin unless contraindicated Outcome: Completed/Met Date Met:  02/11/14 Goal: MD aware of Cardiac Marker results Outcome: Completed/Met Date Met:  02/11/14

## 2014-02-11 NOTE — H&P (Signed)
Admit date: 02/11/2014 Referring Physician:  Dr. Johnney Killian Primary Cardiologist: None Chief complaint/reason for admission:Chest pain  HPI: This is a 73yo WF with a history of lupus, migraine HA's and prediabetes who presents today to the ER with complains of left jaw pain and scapular pain.  She was in her USOH until yesterday when she developed left jaw pain that was initially mild but then became more severe this am.  This am around 7:30am she started having pain in her scapular area and started having numbness in her left face and mouth.  She presented to the ER.  She currently complains of 1/10 pain in her scapular area.  She has never had these symptoms before.  She was given SL NTG x1 and ASA with no relief.  She denies any SOB, DOE, diaphoresis or nausea.  She has had Chest pain in the past remotely with a normal stress test years ago. Cardiology is now asked to evaluate for back and jaw pain.      PMH:    Past Medical History  Diagnosis Date  . Chicken pox   . Arthritis   . Lupus   . Migraines   . Anxiety state, unspecified - sees Dr. Nori Riis in gyn and benzo prescribed there 04/09/2013  . Prediabetes     PSH:    Past Surgical History  Procedure Laterality Date  . Gallbladder surgery  1974  . Back surgery  2000  . Dilation and curettage of uterus  1973  . Cholecystectomy      ALLERGIES:   Codeine; Penicillins; and Sulfa antibiotics  Prior to Admit Meds:   (Not in a hospital admission) Family HX:    Family History  Problem Relation Age of Onset  . Alcoholism    . Arthritis    . Hypertension    . Sudden death    . Arthritis Mother   . Diabetes Mother   . Heart disease Mother   . Heart attack Mother   . Colon cancer Neg Hx   . Pneumonia Father   . COPD Sister   . Heart disease Brother   . Pulmonary embolism Brother   . Alzheimer's disease Brother   . Alzheimer's disease Brother    Social HX:    History   Social History  . Marital Status: Married    Spouse  Name: N/A    Number of Children: N/A  . Years of Education: N/A   Occupational History  . Not on file.   Social History Main Topics  . Smoking status: Never Smoker   . Smokeless tobacco: Never Used  . Alcohol Use: 1.2 oz/week    2 Glasses of wine per week  . Drug Use: No  . Sexual Activity: Not on file   Other Topics Concern  . 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 Elease Hashimoto      Spiritual Beliefs:       Lifestyle: no regular exercise              ROS:  All 11 ROS were addressed and are negative except what is stated in the HPI  PHYSICAL EXAM Filed Vitals:   02/11/14 1100  BP: 126/68  Pulse: 75  Temp:   Resp: 19   General: Well developed, well nourished, in no acute distress Head: Eyes PERRLA, No xanthomas.   Normal cephalic and atramatic  Lungs:   Clear bilaterally to auscultation and percussion. Heart:   HRRR S1 S2 Pulses are 2+ & equal.            No carotid bruit. No JVD.  No abdominal bruits. No femoral bruits. Abdomen: Bowel sounds are positive, abdomen soft and non-tender without masses Extremities:   No clubbing, cyanosis or edema.  DP +1 Neuro: Alert and oriented X 3. Psych:  Good affect, responds appropriately   Labs:   Lab Results  Component Value Date   WBC 3.7* 02/11/2014   HGB 12.2 02/11/2014   HCT 36.8 02/11/2014   MCV 89.1 02/11/2014   PLT 169 02/11/2014    Recent Labs Lab 02/11/14 0940  NA 139  K 4.2  CL 104  CO2 22  BUN 12  CREATININE 0.92  CALCIUM 9.3  GLUCOSE 102*   Lab Results  Component Value Date   TROPONINI <0.30 02/11/2014   No results found for: PTT No results found for: INR, PROTIME   Lab Results  Component Value Date   CHOL 181 10/22/2013   Lab Results  Component Value Date   HDL 33.30* 10/22/2013   Lab Results  Component Value Date   LDLCALC 122* 10/22/2013   Lab Results  Component Value Date   TRIG 128.0  10/22/2013   Lab Results  Component Value Date   CHOLHDL 5 10/22/2013   No results found for: LDLDIRECT    Radiology:  No results found.  EKG:  NSR with no ST changes  ASSESSMENT:  1.  Jaw and back pain worrisome for coronary ischemia although not improved with SL NTG.  She has equal pulses distally and BP is normal so unlikely to represent an aortic dissection.  Chest xray shows normal cardiac silhouette.  EKG is nonischemic.  She also has some facial and mouth numbness of ? Etiology.  She has no other associated symptoms of diaphoresis, nausea or SOB. 2.  Facial and mouth numbness ? Etiology. No neuro deficits noted 3.  SLE 4.  Migraine HA's  PLAN:   1.  Admit to tele bed 2.  Cycle cardiac enzymes 3.  Head CT without contrast given neuro symptoms of facial numbness 4.  Chest CT to rule out PE and assess aorta (pulses are equal distally) 5.  If head CT ok and chest CT neg for dissection then start IV Heparin gtt 6.  Protonix 40mg  daily 7.  Check 2D echo to assess LVF 8.  If enzymes neg and normal LVF on echo then NPO after MN for nuclear stress test in the am  Sueanne Margarita, MD  02/11/2014  11:55 AM

## 2014-02-11 NOTE — Progress Notes (Signed)
ANTICOAGULATION CONSULT NOTE - Initial Consult  Pharmacy Consult for heparin  Indication: chest pain/ACS  Allergies  Allergen Reactions  . Codeine     Per patient made her "flip out"  . Penicillins Hives  . Sulfa Antibiotics Hives    Patient Measurements: Height: 5\' 4"  (162.6 cm) Weight: 143 lb (64.864 kg) IBW/kg (Calculated) : 54.7 Heparin Dosing Weight: 65 kg  Vital Signs: Temp: 98 F (36.7 C) (12/07 1524) Temp Source: Oral (12/07 1524) BP: 107/81 mmHg (12/07 1524) Pulse Rate: 66 (12/07 1524)  Labs:  Recent Labs  02/11/14 0940  HGB 12.2  HCT 36.8  PLT 169  CREATININE 0.92  TROPONINI <0.30    Estimated Creatinine Clearance: 47.7 mL/min (by C-G formula based on Cr of 0.92).   Medical History: Past Medical History  Diagnosis Date  . Chicken pox   . Arthritis   . Lupus   . Migraines   . Anxiety state, unspecified - sees Dr. Nori Riis in gyn and benzo prescribed there 04/09/2013  . Prediabetes     Medications:  Prescriptions prior to admission  Medication Sig Dispense Refill Last Dose  . ALPRAZolam (XANAX) 0.5 MG tablet Take 0.5 mg by mouth as needed for anxiety.    02/09/2014 at Unknown time  . aspirin 81 MG tablet Take 81 mg by mouth daily.   02/10/2014 at Unknown time  . Cholecalciferol (VITAMIN D PO) Take by mouth daily.     Marland Kitchen co-enzyme Q-10 30 MG capsule Take 30 mg by mouth daily.   02/09/2014  . Multiple Vitamin (MULTIVITAMIN) tablet Take 1 tablet by mouth daily.   02/09/2014  . Omega-3 Fatty Acids (FISH OIL) 1200 MG CPDR Take by mouth daily.   02/09/2014    Assessment: 73 yo female with back and jaw pain. Initiating heparin gtt for rule out ACS.  CT head negative for hemorrhage or infarction, CT chest negative for dissection. CBC stable and wnl, eCrcl 40-50 ml/min. Pt is not on anticoagulants PTA.    Goal of Therapy:  Heparin level 0.3-0.7 units/ml Monitor platelets by anticoagulation protocol: Yes   Plan:  Heparin bolus 3500 units x1, followed by drip  at 800 units/hr Daily HL, CBC Heparin level this evening  Hughes Better, PharmD, BCPS Clinical Pharmacist Pager: [phone] 02/11/2014 3:44 PM

## 2014-02-11 NOTE — ED Notes (Signed)
Pt c/o L sided jaw pain that radiates to L neck since last night and back pain in-between shoulder blades since this am.  Per EMS ekg unremarkable and vs stable.  1 nitro given with no pain relief (dropped sbp from 130 - 104).  Pt also took 325 mg asa before ems arrived.

## 2014-02-11 NOTE — ED Notes (Signed)
Patient transported to CT 

## 2014-02-11 NOTE — ED Provider Notes (Signed)
CSN: [id number]     Arrival date & time 02/11/14  0901 History   First MD Initiated Contact with Patient 02/11/14 [phone]     Chief Complaint  Patient presents with  . Jaw Pain  . Back Pain     (Consider location/radiation/quality/duration/timing/severity/associated sxs/prior Treatment) HPI Comments: 73 year old female with a past medical history of anxiety, migraines, lupus and arthritis who presents to the emergency department via EMS from home complaining of pain between her shoulder blades beginning around 7:30 AM when she woke up from sleep. Pain in that area lasted about an hour and radiated towards the back of her left shoulder which is still present. Admits to associated left-sided jaw pain. Denies ever having pain like this in the past. She was given one sublingual nitroglycerin and 325 aspirin prior to arrival with no relief. States after receiving aspirin and nitroglycerin, her mouth became very dry. Denies nausea, vomiting, diaphoresis, chest pain, shortness of breath, fever or chills. Admits to having chest pain in the past, however all of her workups have been negative. She does not have a cardiologist. She has a significant family history for heart attacks. Nonsmoker.  Patient is a 73 y.o. female presenting with back pain. The history is provided by the patient, the spouse and the EMS personnel.  Back Pain   Past Medical History  Diagnosis Date  . Chicken pox   . Arthritis   . Lupus   . Migraines   . Anxiety state, unspecified - sees Dr. Nori Riis in gyn and benzo prescribed there 04/09/2013  . Prediabetes    Past Surgical History  Procedure Laterality Date  . Gallbladder surgery  1974  . Back surgery  2000  . Dilation and curettage of uterus  1973  . Cholecystectomy     Family History  Problem Relation Age of Onset  . Alcoholism    . Arthritis    . Hypertension    . Sudden death    . Arthritis Mother   . Diabetes Mother   . Heart disease Mother   . Heart attack Mother    . Colon cancer Neg Hx   . Pneumonia Father   . COPD Sister   . Heart disease Brother   . Pulmonary embolism Brother   . Alzheimer's disease Brother   . Alzheimer's disease Brother    History  Substance Use Topics  . Smoking status: Never Smoker   . Smokeless tobacco: Never Used  . Alcohol Use: 1.2 oz/week    2 Glasses of wine per week   OB History    No data available     Review of Systems  Musculoskeletal: Positive for back pain.      Allergies  Codeine; Penicillins; and Sulfa antibiotics  Home Medications   Prior to Admission medications   Medication Sig Start Date End Date Taking? Authorizing Provider  ALPRAZolam Duanne Moron) 0.5 MG tablet Take 0.5 mg by mouth as needed for anxiety.    Yes Maisie Fus, MD  aspirin 81 MG tablet Take 81 mg by mouth daily.   Yes Historical Provider, MD  Cholecalciferol (VITAMIN D PO) Take by mouth daily.   Yes Historical Provider, MD  co-enzyme Q-10 30 MG capsule Take 30 mg by mouth daily.   Yes Historical Provider, MD  Multiple Vitamin (MULTIVITAMIN) tablet Take 1 tablet by mouth daily.   Yes Historical Provider, MD  Omega-3 Fatty Acids (FISH OIL) 1200 MG CPDR Take by mouth daily.   Yes Historical Provider, MD  BP 130/71 mmHg  Pulse 66  Temp(Src) 98.5 F (36.9 C) (Oral)  Resp 15  Ht 5\' 4"  (1.626 m)  Wt 143 lb (64.864 kg)  BMI 24.53 kg/m2  SpO2 100% Physical Exam  Constitutional: She is oriented to person, place, and time. She appears well-developed and well-nourished. No distress.  HENT:  Head: Normocephalic and atraumatic.  Mouth/Throat: Oropharynx is clear and moist.  Eyes: Conjunctivae and EOM are normal. Pupils are equal, round, and reactive to light.  Neck: Normal range of motion. Neck supple. No JVD present.  Cardiovascular: Normal rate, regular rhythm, normal heart sounds and intact distal pulses.   No extremity edema.  Pulmonary/Chest: Effort normal and breath sounds normal. No respiratory distress.  Abdominal: Soft.  Bowel sounds are normal. There is no tenderness.  Musculoskeletal: Normal range of motion. She exhibits no edema.  Neurological: She is alert and oriented to person, place, and time. She has normal strength. No sensory deficit.  Speech fluent, goal oriented. Moves limbs without ataxia. Equal grip strength bilateral.  Skin: Skin is warm and dry. She is not diaphoretic.  Psychiatric: She has a normal mood and affect. Her behavior is normal.  Nursing note and vitals reviewed.   ED Course  Procedures (including critical care time) Labs Review Labs Reviewed  BASIC METABOLIC PANEL - Abnormal; Notable for the following:    Glucose, Bld 102 (*)    GFR calc non Af Amer 61 (*)    GFR calc Af Amer 70 (*)    All other components within normal limits  CBC - Abnormal; Notable for the following:    WBC 3.7 (*)    All other components within normal limits  TROPONIN I    Imaging Review Dg Chest 2 View  02/11/2014   CLINICAL DATA:  Chest pain radiating to shoulders and left jaw.  EXAM: CHEST  2 VIEW  COMPARISON:  None.  FINDINGS: The heart size and mediastinal contours are within normal limits. Both lungs are clear. No evidence of pleural effusion. Mild thoracic spine degenerative changes and dextroscoliosis appear stable.  IMPRESSION: No active cardiopulmonary disease.   Electronically Signed   By: Earle Gell M.D.   On: 02/11/2014 10:07     EKG Interpretation None      MDM   Final diagnoses:  Chest pain   Patient in no apparent distress. Vital signs stable. History of moderately suspicious for cardiac in origin. Workup negative. She does have a HEART score of 4. Recommended admission for observation/chest pain rule out, however pt would like to go home. Plan to check delta troponin and have cardiology evaluate patient.  Pt admitted by cardiology.  Carman Ching, PA-C 02/11/14 1219  Charlesetta Shanks, MD 02/12/14 [phone]

## 2014-02-12 ENCOUNTER — Other Ambulatory Visit: Payer: Self-pay | Admitting: Physician Assistant

## 2014-02-12 ENCOUNTER — Other Ambulatory Visit: Payer: Self-pay

## 2014-02-12 DIAGNOSIS — R6884 Jaw pain: Secondary | ICD-10-CM

## 2014-02-12 LAB — LIPID PANEL
Cholesterol: 180 mg/dL (ref 0–200)
HDL: 37 mg/dL — AB (ref >39)
LDL CALC: 119 mg/dL — AB (ref 0–99)
TRIGLYCERIDES: 119 mg/dL (ref <150)
Total CHOL/HDL Ratio: 4.9 RATIO
VLDL: 24 mg/dL (ref 0–40)

## 2014-02-12 LAB — BASIC METABOLIC PANEL
Anion gap: 14 (ref 5–15)
BUN: 11 mg/dL (ref 6–23)
CALCIUM: 8.9 mg/dL (ref 8.4–10.5)
CO2: 21 mEq/L (ref 19–32)
CREATININE: 0.78 mg/dL (ref 0.50–1.10)
Chloride: 104 mEq/L (ref 96–112)
GFR calc Af Amer: >90 mL/min (ref >90)
GFR, EST NON AFRICAN AMERICAN: 82 mL/min — AB (ref >90)
GLUCOSE: 94 mg/dL (ref 70–99)
Potassium: 3.9 mEq/L (ref 3.7–5.3)
Sodium: 139 mEq/L (ref 137–147)

## 2014-02-12 LAB — CBC
HEMATOCRIT: 37.4 % (ref 36.0–46.0)
HEMOGLOBIN: 12.2 g/dL (ref 12.0–15.0)
MCH: 29 pg (ref 26.0–34.0)
MCHC: 32.6 g/dL (ref 30.0–36.0)
MCV: 89 fL (ref 78.0–100.0)
Platelets: 180 10*3/uL (ref 150–400)
RBC: 4.2 MIL/uL (ref 3.87–5.11)
RDW: 13.6 % (ref 11.5–15.5)
WBC: 3.7 10*3/uL — ABNORMAL LOW (ref 4.0–10.5)

## 2014-02-12 LAB — TROPONIN I: Troponin I: <0.3 ng/mL (ref <0.30)

## 2014-02-12 LAB — HEPARIN LEVEL (UNFRACTIONATED)
HEPARIN UNFRACTIONATED: 0.57 [IU]/mL (ref 0.30–0.70)
HEPARIN UNFRACTIONATED: 0.58 [IU]/mL (ref 0.30–0.70)

## 2014-02-12 MED ORDER — ATORVASTATIN CALCIUM 40 MG PO TABS: 40.0000 mg | ORAL_TABLET | Freq: Every day | ORAL | Status: DC

## 2014-02-12 NOTE — Progress Notes (Signed)
1:35 AM   HL is therapeutic at 0.57 IU/ml. No bleeding noted. Continue at 800 units/hr and f/u am labs for adjustments if necessary  Curlene Dolphin

## 2014-02-12 NOTE — Discharge Summary (Signed)
Discharge Summary   Patient ID: Tara Martin,  MRN: [id number], DOB/AGE: 1940-12-28 73 y.o.  Admit date: 02/11/2014 Discharge date: 02/12/2014  Primary Care Provider: Lucretia Kern Primary Cardiologist: New- Dr. Radford Pax  Discharge Diagnoses Principal Problem:   Angina pectoris Active Problems:   Lupus - followed by rheum   Anxiety state   Prediabetes   Facial numbness   Allergies Allergies  Allergen Reactions  . Codeine     Per patient made her "flip out"  . Penicillins Hives  . Sulfa Antibiotics Hives    Procedures  CTA Chest and Abdomen 02/11/2014 IMPRESSION: 1. Normal appearing heart and thoracic and abdominal aorta. 2. Emphysematous blebs scattered throughout the lungs. 3. Benign appearing abdomen. Calcified fibroids in the uterus.    CT Head 12/7//2015 FINDINGS: The bony calvarium is intact. No gross soft tissue abnormality is noted. Mild atrophic changes are seen. No findings to suggest acute hemorrhage, acute infarction or space-occupying mass lesion are noted.  IMPRESSION: No acute abnormality noted.    Hospital Course  The patient is a 73 year old female with history of lupus, migraine headache, prediabetes presented to [facility] on 02/11/2014 with 1 day onset of left jaw pain and scapular pain. She was in her usual state of health prior to that. She also endorsed this having some numbness in her left face and mouth. She never had this type of symptom before and had no prior cardiac history. She took aspirin and nitroglycerin glycerin with no relief. She had chest pain in the past remotely with a normal stress test years ago. Cardiology was asked to evaluate for back and jaw pain.   Patient was admitted overnight for observation, serial troponin was negative. Given her left facial numbness, CT of the brain was obtained which was negative for acute process. CTA of the chest was negative for aortic dissection or PE however did note  emphysematous blebs scattered throughout the lung. CTA of abdomen and pelvis showed calcified fibroids in the uterus, otherwise no acute process in the abdomen. TSH wasn normal. Lipid panel was obtained on 12/80/2015. Her LDL was 119, HDL 37, cholesterol 180, triglycerides 119. She was placed on 40 mg of Lipitor. After discussing with the treatment options with the patient regarding either inpatient Myoview versus outpatient stress test, she wished to evaluate as outpatient. Outpatient treadmill myoview was scheduled on 02/16/2014. Prior to discharge, patient ambulated in the hallway without significant chest, back or jaw discomfort. She is deemed stable for discharge from cardiology perspective. If she has abnormal Myoview, she will need to follow-up with Dr. Radford Pax, otherwise if her Myoview is normal, she will Follow-up with her PCP regarding left jaw pain.   Discharge Vitals Blood pressure 97/43, pulse 63, temperature 97.9 F (36.6 C), temperature source Oral, resp. rate 16, height 5\' 4"  (1.626 m), weight 139 lb 14.4 oz (63.458 kg), SpO2 98 %.  Filed Weights   02/11/14 0905 02/12/14 0500  Weight: 143 lb (64.864 kg) 139 lb 14.4 oz (63.458 kg)    Labs  CBC  Recent Labs  02/11/14 1517 02/12/14 0326  WBC 4.1 3.7*  NEUTROABS 2.7  --   HGB 12.8 12.2  HCT 38.0 37.4  MCV 88.4 89.0  PLT 184 263   Basic Metabolic Panel  Recent Labs  02/11/14 1517 02/12/14 0326  NA 139 139  K 4.2 3.9  CL 102 104  CO2 24 21  GLUCOSE 106* 94  BUN 11 11  CREATININE 0.81 0.78  CALCIUM 9.5  8.9  MG 2.0  --    Liver Function Tests  Recent Labs  02/11/14 1517  AST 27  ALT 23  ALKPHOS 79  BILITOT 0.3  PROT 7.4  ALBUMIN 3.8   Cardiac Enzymes  Recent Labs  02/11/14 1517 02/11/14 2049 02/12/14 0326  TROPONINI <0.30 <0.30 <0.30   Fasting Lipid Panel  Recent Labs  02/12/14 0326  CHOL 180  HDL 37*  LDLCALC 119*  TRIG 119  CHOLHDL 4.9   Thyroid Function Tests  Recent Labs   02/11/14 1517  TSH 4.010    Disposition  Pt is being discharged home today in good condition.  Follow-up Plans & Appointments      Follow-up Information    Follow up with [facility] On 02/18/2014.   Specialty:  Cardiology   Why:  9:15am. For chemical stress test. Followup with Dr. Radford Pax only if result is abnormal. If normal, followup with your primary care physician. See Dr Theodosia Blender office phone # next our address   Contact information:   [address], [place] [phone]      Discharge Medications    Medication List    TAKE these medications        ALPRAZolam 0.5 MG tablet  Commonly known as:  XANAX  Take 0.5 mg by mouth as needed for anxiety.     aspirin 81 MG tablet  Take 81 mg by mouth daily.     atorvastatin 40 MG tablet  Commonly known as:  LIPITOR  Take 1 tablet (40 mg total) by mouth daily at 6 PM.     co-enzyme Q-10 30 MG capsule  Take 30 mg by mouth daily.     Fish Oil 1200 MG Cpdr  Take by mouth daily.     multivitamin tablet  Take 1 tablet by mouth daily.     VITAMIN D PO  Take by mouth daily.          Duration of Discharge Encounter   Greater than 30 minutes including physician time.  Hilbert Corrigan PA-C Pager: [phone] 02/12/2014, 12:27 PM

## 2014-02-12 NOTE — Progress Notes (Signed)
Pt. Ambulated in the hall approximately 750 ft. Pt remained asymptomatic with no complaints during ambulation. Pt tolerated well.

## 2014-02-12 NOTE — Progress Notes (Signed)
    Subjective:  The patient feels better today. No shortness of breath or chest pain. The pain in her scapular area is improved with movement and stretching. She has mild residual discomfort in the left TMJ area but no other associated symptoms.  Objective:  Vital Signs in the last 24 hours: Temp:  [97.9 F (36.6 C)-98.5 F (36.9 C)] 97.9 F (36.6 C) (12/08 0500) Pulse Rate:  [56-75] 63 (12/08 0500) Resp:  [13-20] 16 (12/08 0500) BP: (97-130)/(43-81) 97/43 mmHg (12/08 0500) SpO2:  [96 %-100 %] 98 % (12/08 0500) Weight:  [139 lb 14.4 oz (63.458 kg)] 139 lb 14.4 oz (63.458 kg) (12/08 0500)  Intake/Output from previous day: 12/07 0701 - 12/08 0700 In: 350 [P.O.:342; I.V.:8] Out: -   Physical Exam: Pt is alert and oriented, NAD HEENT: normal Neck: JVP - normal Lungs: CTA bilaterally CV: RRR without murmur or gallop Abd: soft, NT, Positive BS, no hepatomegaly Ext: no C/C/E, distal pulses intact and equal Skin: warm/dry no rash   Lab Results:  Recent Labs  02/11/14 1517 02/12/14 0326  WBC 4.1 3.7*  HGB 12.8 12.2  PLT 184 180    Recent Labs  02/11/14 1517 02/12/14 0326  NA 139 139  K 4.2 3.9  CL 102 104  CO2 24 21  GLUCOSE 106* 94  BUN 11 11  CREATININE 0.81 0.78    Recent Labs  02/11/14 2049 02/12/14 0326  TROPONINI <0.30 <0.30    Cardiac Studies: Twelve-lead EKG: Normal sinus rhythm, within normal limits.  Tele: Normal sinus rhythm, personally reviewed.  Assessment/Plan:  Jaw/back pain. CT angiogram of the chest, EKG, and serial troponins are all normal. Reviewed further diagnostic options with the patient, including an inpatient Myoview versus discharge home with outpatient follow-up. She prefers discharge home this morning and I think this is reasonable. Will arrange an outpatient stress nuclear scan. The patient will ambulate in the halls to make sure that she has no symptoms with exertion. As long as she is doing okay, will discharge her home  this morning. Other studies were reviewed including her CT scan of the brain and comprehensive lab work. The patient will f/u with Dr Radford Pax if any abnormalities are identified on her stress test.   Sherren Mocha, M.D. 02/12/2014, 9:58 AM

## 2014-02-14 ENCOUNTER — Encounter (HOSPITAL_COMMUNITY): Payer: Medicare HMO

## 2014-02-18 ENCOUNTER — Encounter (HOSPITAL_COMMUNITY): Payer: Medicare HMO

## 2014-02-19 ENCOUNTER — Ambulatory Visit (HOSPITAL_COMMUNITY): Payer: Medicare HMO | Attending: Cardiology | Admitting: Radiology

## 2014-02-19 VITALS — BP 125/70 | Ht 64.0 in | Wt 141.0 lb

## 2014-02-19 DIAGNOSIS — Z6824 Body mass index (BMI) 24.0-24.9, adult: Secondary | ICD-10-CM | POA: Diagnosis not present

## 2014-02-19 DIAGNOSIS — R079 Chest pain, unspecified: Secondary | ICD-10-CM

## 2014-02-19 DIAGNOSIS — R6884 Jaw pain: Secondary | ICD-10-CM | POA: Diagnosis present

## 2014-02-19 DIAGNOSIS — R7309 Other abnormal glucose: Secondary | ICD-10-CM | POA: Insufficient documentation

## 2014-02-19 DIAGNOSIS — R0602 Shortness of breath: Secondary | ICD-10-CM

## 2014-02-19 DIAGNOSIS — R52 Pain, unspecified: Secondary | ICD-10-CM | POA: Diagnosis present

## 2014-02-19 MED ORDER — TECHNETIUM TC 99M SESTAMIBI GENERIC - CARDIOLITE
11.0000 | Freq: Once | INTRAVENOUS | Status: AC | PRN
  Administered 2014-02-19: 11 via INTRAVENOUS

## 2014-02-19 MED ORDER — TECHNETIUM TC 99M SESTAMIBI GENERIC - CARDIOLITE
33.0000 | Freq: Once | INTRAVENOUS | Status: AC | PRN
  Administered 2014-02-19: 33 via INTRAVENOUS

## 2014-02-19 NOTE — Progress Notes (Signed)
[facility] [address] [place], [place] [address] [phone]    Cardiology Nuclear Med Study  Tara Martin is a 73 y.o. female     MRN : [id number]     DOB: 1940/10/17  Procedure Date: 02/19/2014  Nuclear Med Background Indication for Stress Test:  Evaluation for Ischemia and [facility] 02/11/14 scapular pain and left jaw pain,(-) enzymes History:  Normal stress test yrs ago Cardiac Risk Factors: Prediabetes  Symptoms:  Chest Pain   Nuclear Pre-Procedure Caffeine/Decaff Intake:  9:00pm NPO After: 9:00pm   Lungs:  clear O2 Sat: 97% on room air. IV 0.9% NS with Angio Cath:  22g  IV Site: R Antecubital x 1, tolerated well IV Started by:  Irven Baltimore, RN  Chest Size (in):  34 Cup Size: D  Height: 5\' 4"  (1.626 m)  Weight:  141 lb (63.957 kg)  BMI:  Body mass index is 24.19 kg/(m^2). Tech Comments:  n/a    Nuclear Med Study 1 or 2 day study: 1 day  Stress Test Type:  Stress  Reading MD: n/a  Order Authorizing Provider:  Fransico Him, MD  Resting Radionuclide: Technetium 55m Sestamibi  Resting Radionuclide Dose: 11.0 mCi   Stress Radionuclide:  Technetium 91m Sestamibi  Stress Radionuclide Dose: 33.0 mCi           Stress Protocol Rest HR: 60 Stress HR: 146  Rest BP: 125/70 Stress BP: 173/64  Exercise Time (min): 6:30 METS: 7.70   Predicted Max HR: 148 bpm % Max HR: 98.65 bpm Rate Pressure Product: 25258   Dose of Adenosine (mg):  n/a Dose of Lexiscan: n/a mg  Dose of Atropine (mg): n/a Dose of Dobutamine: n/a mcg/kg/min (at max HR)  Stress Test Technologist: Perrin Maltese, EMT-P  Nuclear Technologist:  Earl Many, CNMT     Rest Procedure:  Myocardial perfusion imaging was performed at rest 45 minutes following the intravenous administration of Technetium 72m Sestamibi. Rest ECG: NSR - Normal EKG  Stress Procedure:  The patient exercised on the treadmill utilizing the Bruce Protocol for 6:30 minutes. The patient stopped  due to fatigue and denied any chest pain.  Technetium 83m Sestamibi was injected at peak exercise and myocardial perfusion imaging was performed after a brief delay. Stress ECG: No significant ST segment change suggestive of ischemia.  QPS Raw Data Images:  Acquisition technically good; normal left ventricular size. Stress Images:  There is decreased uptake in the apex. Rest Images:  There is decreased uptake in the apex. Subtraction (SDS):  No evidence of ischemia. Transient Ischemic Dilatation (Normal <1.22):  1.05 Lung/Heart Ratio (Normal <0.45):  0.31  Quantitative Gated Spect Images QGS EDV:  69 ml QGS ESV:  22 ml  Impression Exercise Capacity:  Fair exercise capacity. BP Response:  Normal blood pressure response. Clinical Symptoms:  No chest pain or dyspnea. ECG Impression:  No significant ST segment change suggestive of ischemia. Comparison with Prior Nuclear Study: No previous nuclear study performed  Overall Impression:  Low risk stress nuclear study with a small, severe intensity, fixed apical defect consistent with thinning/soft tissue attenuation; no ischemia.  LV Ejection Fraction: 68%.  LV Wall Motion:  NL LV Function; NL Wall Motion  Kirk Ruths

## 2014-04-23 ENCOUNTER — Ambulatory Visit (INDEPENDENT_AMBULATORY_CARE_PROVIDER_SITE_OTHER): Payer: Medicare HMO | Admitting: Family Medicine

## 2014-04-23 ENCOUNTER — Encounter: Payer: Self-pay | Admitting: Family Medicine

## 2014-04-23 VITALS — BP 112/72 | HR 109 | Temp 97.4°F | Ht 64.0 in | Wt 141.0 lb

## 2014-04-23 DIAGNOSIS — R109 Unspecified abdominal pain: Secondary | ICD-10-CM

## 2014-04-23 DIAGNOSIS — R110 Nausea: Secondary | ICD-10-CM

## 2014-04-23 LAB — COMPREHENSIVE METABOLIC PANEL
ALBUMIN: 4.3 g/dL (ref 3.5–5.2)
ALT: 23 U/L (ref 0–35)
AST: 29 U/L (ref 0–37)
Alkaline Phosphatase: 78 U/L (ref 39–117)
BUN: 12 mg/dL (ref 6–23)
CALCIUM: 9.7 mg/dL (ref 8.4–10.5)
CHLORIDE: 100 meq/L (ref 96–112)
CO2: 27 mEq/L (ref 19–32)
Creatinine, Ser: 0.92 mg/dL (ref 0.40–1.20)
GFR: 63.7 mL/min (ref >60.00)
Glucose, Bld: 90 mg/dL (ref 70–99)
POTASSIUM: 4.2 meq/L (ref 3.5–5.1)
Sodium: 132 mEq/L — ABNORMAL LOW (ref 135–145)
Total Bilirubin: 0.5 mg/dL (ref 0.2–1.2)
Total Protein: 7.8 g/dL (ref 6.0–8.3)

## 2014-04-23 LAB — POCT URINALYSIS DIPSTICK
Bilirubin, UA: NEGATIVE
Glucose, UA: NEGATIVE
LEUKOCYTES UA: NEGATIVE
Nitrite, UA: NEGATIVE
PROTEIN UA: NEGATIVE
RBC UA: NEGATIVE
Spec Grav, UA: <=1.005
Urobilinogen, UA: 0.2
pH, UA: 6

## 2014-04-23 LAB — LIPASE: LIPASE: 36 U/L (ref 11.0–59.0)

## 2014-04-23 NOTE — Progress Notes (Signed)
Pre visit review using our clinic review tool, if applicable. No additional management support is needed unless otherwise documented below in the visit note. 

## 2014-04-23 NOTE — Progress Notes (Signed)
HPI:  Tara Martin is a 74 yo pleasant woman with a PMH sig for anxiety, migraines and lupus here for an acute visit for:  Abd pain: -started: 1 week ago -symptoms: soreness (mild in entire lower stomach), mild intermittent pain in bilat back, mild nausea yesterday -has tried: had been several days since BM so took laxative a few days ago nd had BM and felt a little better -on antibiotic (doxy) the last few weeks for her skin and probiotic the last week -denies: sig pain in abd, diarrhea, vomiting, dysuria, melena, blood in stools -hx of: mild constipation -hx fibroids, sees Dr. Nori Riis for this and bladder issues - CT abd and pelvis about 2 months ago   ROS: See pertinent positives and negatives per HPI.  Past Medical History  Diagnosis Date  . Chicken pox   . Arthritis   . Lupus   . Migraines   . Anxiety state, unspecified - sees Dr. Nori Riis in gyn and benzo prescribed there 04/09/2013  . Prediabetes     Past Surgical History  Procedure Laterality Date  . Gallbladder surgery  1974  . Back surgery  2000  . Dilation and curettage of uterus  1973  . Cholecystectomy      Family History  Problem Relation Age of Onset  . Alcoholism    . Arthritis    . Hypertension    . Sudden death    . Arthritis Mother   . Diabetes Mother   . Heart disease Mother   . Heart attack Mother   . Colon cancer Neg Hx   . Pneumonia Father   . COPD Sister   . Heart disease Brother   . Pulmonary embolism Brother   . Alzheimer's disease Brother   . Alzheimer's disease Brother     History   Social History  . Marital Status: Married    Spouse Name: N/A  . Number of Children: N/A  . Years of Education: N/A   Social History Main Topics  . Smoking status: Never Smoker   . Smokeless tobacco: Never Used  . Alcohol Use: 1.2 oz/week    2 Glasses of wine per week  . Drug Use: No  . Sexual Activity: Not on file   Other Topics Concern  . 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 Elease Hashimoto      Spiritual Beliefs:       Lifestyle: no regular exercise              Current outpatient prescriptions:  .  ALPRAZolam (XANAX) 0.5 MG tablet, Take 0.5 mg by mouth as needed for anxiety. , Disp: , Rfl:  .  aspirin 81 MG tablet, Take 81 mg by mouth daily., Disp: , Rfl:  .  Cholecalciferol (VITAMIN D PO), Take by mouth daily., Disp: , Rfl:  .  co-enzyme Q-10 30 MG capsule, Take 30 mg by mouth daily., Disp: , Rfl:  .  Multiple Vitamin (MULTIVITAMIN) tablet, Take 1 tablet by mouth daily., Disp: , Rfl:  .  Omega-3 Fatty Acids (FISH OIL) 1200 MG CPDR, Take by mouth daily., Disp: , Rfl:   EXAM:  Filed Vitals:   04/23/14 0926  BP: 112/72  Pulse: 109  Temp: 97.4 F (36.3 C)    Body mass index is 24.19 kg/(m^2).  GENERAL: vitals reviewed and listed above, alert, oriented, appears well hydrated and in no acute  distress  HEENT: atraumatic, conjunttiva clear, no obvious abnormalities on inspection of external nose and ears  NECK: no obvious masses on inspection  LUNGS: clear to auscultation bilaterally, no wheezes, rales or rhonchi, good air movement  CV: HRRR, no peripheral edema  ABD: BS+, soft, NTTP, no CVA TTP  MS: moves all extremities without noticeable abnormality  PSYCH: pleasant and cooperative, no obvious depression or anxiety  ASSESSMENT AND PLAN:  Discussed the following assessment and plan:  Abdominal pain, unspecified abdominal location - Plan: CMP, Lipase, CMP  Nausea without vomiting - Plan: CMP, Lipase, CMP   -benign exam, mild symptoms, query constipation vs abx side effect -discussed other potential etiologies -opted to do some basic labs and hold on imaging for now -close follow up, return and emergency precautions -Patient advised to return or notify a doctor immediately if symptoms worsen or persist or new concerns arise.  Patient Instructions  BEFORE YOU LEAVE:   -labs  Metameucil daily in liquid  If no bowel movement for > 1 days, straining with stool, hard stools --> mirilax 1 capful daily in liquid for several days until soft bowel movement daily  Avoid dairy  Stop probiotic  Follow up in 2 weeks if symptoms persist  If severe pain, unable to tolerate liquids, fevers seek care immediately     Colin Benton R.

## 2014-04-23 NOTE — Addendum Note (Signed)
Addended by: Agnes Lawrence on: 04/23/2014 10:17 AM   Modules accepted: Orders

## 2014-04-23 NOTE — Patient Instructions (Addendum)
BEFORE YOU LEAVE:  -urine dip -labs -follow up in 2 weeks  Metameucil daily in liquid  If no bowel movement for > 1 days, straining with stool, hard stools --> mirilax 1 capful daily in liquid for several days until soft bowel movement daily  Avoid dairy  Stop probiotic  Follow up in 2 weeks if symptoms persist; f/u with Dr. Nori Riis about your bladder and fibroids  If severe pain, unable to tolerate liquids, fevers seek care immediately

## 2014-04-24 ENCOUNTER — Telehealth: Payer: Self-pay

## 2014-04-24 NOTE — Telephone Encounter (Signed)
[facility] Night - Client TELEPHONE [facility] Patient Name: Tara Martin Gender: Female DOB: April 01, 1940 Age: 74 Y 9 M Return Phone Number: [phone] (Primary) Address: City/State/Zip: [place] [address] Client [facility] Night - Client Client Site [facility] - Night Physician Colin Benton Contact Type Call Call Type Triage / Clinical Relationship To Patient Self Return Phone Number [phone] (Primary) Chief Complaint URINATE - sudden inability to urinate Initial Comment Caller state she has only urinating once today. Has not be able to urinate since earlier, drank plenty of fluids not sure what is going on. PreDisposition Did not know what to do Nurse Assessment Nurse: Loletta Specter, RN, Wells Guiles Date/Time Eilene Ghazi Time): 04/23/2014 5:25:24 PM Confirm and document reason for call. If symptomatic, describe symptoms. ---Caller states last urine at noon and has been unable to urinate even after drinking lots of fluid. Seen in office today for abdominal pain and unable to give sample. She took sample back to office. Has the patient traveled out of the country within the last 30 days? ---Not Applicable Does the patient require triage? ---Yes Related visit to physician within the last 2 weeks? ---No Does the PT have any chronic conditions? (i.e. diabetes, asthma, etc.) ---No Guidelines Guideline Title Affirmed Question Affirmed Notes Nurse Date/Time Eilene Ghazi Time) Abdominal Pain - Female [1] MODERATE (e.g., interferes with normal activities) AND [2] pain comes and goes (cramps) AND [3] present > 24 hours (Exception: pain with Vomiting or Diarrhea - see that Guideline) Loletta Specter, RN, Wells Guiles 04/23/2014 5:27:18 PM Disp. Time Eilene Ghazi Time) Disposition Final User 04/23/2014 5:23:12 PM Send to Urgent Fonnie Jarvis 04/23/2014 5:30:14 PM See Physician within 24 Hours Yes Loletta Specter,  RN, Wells Guiles PLEASE NOTE: All timestamps contained within this report are represented as [place] Standard Time. CONFIDENTIALTY NOTICE: This fax transmission is intended only for the addressee. It contains information that is legally privileged, confidential or otherwise protected from use or disclosure. If you are not the intended recipient, you are strictly prohibited from reviewing, disclosing, copying using or disseminating any of this information or taking any action in reliance on or regarding this information. If you have received this fax in error, please notify us immediately by telephone so that we can arrange for its return to [place]. Phone: [phone], Toll-Free: [phone], Fax: [phone] Page: 2 of 2 Call Id: [id number] Caller Understands: Yes Disagree/Comply: Comply Care Advice Given Per Guideline SEE PHYSICIAN WITHIN 24 HOURS: * IF OFFICE WILL BE OPEN: You need to be examined within the next 24 hours. Call your doctor when the office opens, and make an appointment. CALL BACK IF: * Severe pain lasts over 1 hour * Constant pain lasts over 2 hours * You become worse. CARE ADVICE given per Abdominal Pain, Female (Adult) guideline. Advised caller to call back if no urine in 8-12 hours, or severe pain, or feels like she needs to urinate but is unable to. After Care

## 2014-05-07 ENCOUNTER — Encounter: Payer: Self-pay | Admitting: Family Medicine

## 2014-05-07 ENCOUNTER — Ambulatory Visit (INDEPENDENT_AMBULATORY_CARE_PROVIDER_SITE_OTHER): Payer: Medicare HMO | Admitting: Family Medicine

## 2014-05-07 VITALS — BP 108/80 | HR 82 | Temp 97.5°F | Ht 64.0 in | Wt 145.2 lb

## 2014-05-07 DIAGNOSIS — R1084 Generalized abdominal pain: Secondary | ICD-10-CM

## 2014-05-07 NOTE — Progress Notes (Signed)
HPI:  Follow up:  Abd pain: -seen 2 weeks ago -reports all symptoms have resolved -occ epigastric mild soreness, brief and rare - reports she remembers having eval in the past for this and told mild slow bowel transit -metameucil -denies: fevers, malaise, weight loss, nausea, vomiting, diarrhea, blood in bowels  ROS: See pertinent positives and negatives per HPI.  Past Medical History  Diagnosis Date  . Chicken pox   . Arthritis   . Lupus   . Migraines   . Anxiety state, unspecified - sees Dr. Nori Riis in gyn and benzo prescribed there 04/09/2013  . Prediabetes     Past Surgical History  Procedure Laterality Date  . Gallbladder surgery  1974  . Back surgery  2000  . Dilation and curettage of uterus  1973  . Cholecystectomy      Family History  Problem Relation Age of Onset  . Alcoholism    . Arthritis    . Hypertension    . Sudden death    . Arthritis Mother   . Diabetes Mother   . Heart disease Mother   . Heart attack Mother   . Colon cancer Neg Hx   . Pneumonia Father   . COPD Sister   . Heart disease Brother   . Pulmonary embolism Brother   . Alzheimer's disease Brother   . Alzheimer's disease Brother     History   Social History  . Marital Status: Married    Spouse Name: N/A  . Number of Children: N/A  . Years of Education: N/A   Social History Main Topics  . Smoking status: Never Smoker   . Smokeless tobacco: Never Used  . Alcohol Use: 1.2 oz/week    2 Glasses of wine per week  . Drug Use: No  . Sexual Activity: Not on file   Other Topics Concern  . 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 Elease Hashimoto      Spiritual Beliefs:       Lifestyle: no regular exercise              Current outpatient prescriptions:  .  ALPRAZolam (XANAX) 0.5 MG tablet, Take 0.5 mg by mouth as needed for anxiety. , Disp: , Rfl:  .  aspirin 81 MG tablet, Take 81 mg by mouth daily., Disp:  , Rfl:  .  Cholecalciferol (VITAMIN D PO), Take by mouth daily., Disp: , Rfl:  .  co-enzyme Q-10 30 MG capsule, Take 30 mg by mouth daily., Disp: , Rfl:  .  Multiple Vitamin (MULTIVITAMIN) tablet, Take 1 tablet by mouth daily., Disp: , Rfl:  .  Omega-3 Fatty Acids (FISH OIL) 1200 MG CPDR, Take by mouth daily., Disp: , Rfl:   EXAM:  Filed Vitals:   05/07/14 1027  BP: 108/80  Pulse: 82  Temp: 97.5 F (36.4 C)    Body mass index is 24.91 kg/(m^2).  GENERAL: vitals reviewed and listed above, alert, oriented, appears well hydrated and in no acute distress  HEENT: atraumatic, conjunttiva clear, no obvious abnormalities on inspection of external nose and ears  NECK: no obvious masses on inspection  LUNGS: clear to auscultation bilaterally, no wheezes, rales or rhonchi, good air movement  ABD: BS+, soft, NTTP - no rebound or guarding  CV: HRRR, no peripheral edema  MS: moves all extremities without noticeable abnormality  PSYCH: pleasant and cooperative, no obvious depression or anxiety  ASSESSMENT AND PLAN:  Discussed the following assessment and plan:  No diagnosis found.  -Patient advised to return or notify a doctor immediately if symptoms worsen or persist or new concerns arise.  Patient Instructions  Let us know if any persistent abdominal issues and would advise evaluation with the gastroenterologist.  Schedule your mammogram.  Schedule Medicare Wellness visit in August 2016     [place], [place] R.

## 2014-05-07 NOTE — Patient Instructions (Signed)
Let us know if any persistent abdominal issues and would advise evaluation with the gastroenterologist.  Schedule your mammogram.  Schedule Medicare Wellness visit in August 2016

## 2014-05-07 NOTE — Progress Notes (Signed)
Pre visit review using our clinic review tool, if applicable. No additional management support is needed unless otherwise documented below in the visit note. 

## 2014-11-05 ENCOUNTER — Ambulatory Visit (INDEPENDENT_AMBULATORY_CARE_PROVIDER_SITE_OTHER): Payer: Medicare HMO | Admitting: Family Medicine

## 2014-11-05 ENCOUNTER — Encounter: Payer: Self-pay | Admitting: Family Medicine

## 2014-11-05 VITALS — BP 108/78 | HR 74 | Temp 98.3°F | Ht 63.25 in | Wt 141.1 lb

## 2014-11-05 DIAGNOSIS — M329 Systemic lupus erythematosus, unspecified: Secondary | ICD-10-CM | POA: Diagnosis not present

## 2014-11-05 DIAGNOSIS — Z0000 Encounter for general adult medical examination without abnormal findings: Secondary | ICD-10-CM | POA: Diagnosis not present

## 2014-11-05 DIAGNOSIS — R7303 Prediabetes: Secondary | ICD-10-CM

## 2014-11-05 DIAGNOSIS — E785 Hyperlipidemia, unspecified: Secondary | ICD-10-CM

## 2014-11-05 DIAGNOSIS — R7309 Other abnormal glucose: Secondary | ICD-10-CM | POA: Diagnosis not present

## 2014-11-05 DIAGNOSIS — F411 Generalized anxiety disorder: Secondary | ICD-10-CM | POA: Diagnosis not present

## 2014-11-05 DIAGNOSIS — M5442 Lumbago with sciatica, left side: Secondary | ICD-10-CM | POA: Insufficient documentation

## 2014-11-05 LAB — LIPID PANEL
Cholesterol: 187 mg/dL (ref 0–200)
HDL: 35.2 mg/dL — AB (ref >39.00)
LDL CALC: 124 mg/dL — AB (ref 0–99)
NONHDL: 151.95
TRIGLYCERIDES: 138 mg/dL (ref 0.0–149.0)
Total CHOL/HDL Ratio: 5
VLDL: 27.6 mg/dL (ref 0.0–40.0)

## 2014-11-05 LAB — CBC WITH DIFFERENTIAL/PLATELET
Basophils Absolute: 0 10*3/uL (ref 0.0–0.1)
Basophils Relative: 0.7 % (ref 0.0–3.0)
Eosinophils Absolute: 0 10*3/uL (ref 0.0–0.7)
Eosinophils Relative: 1.5 % (ref 0.0–5.0)
HEMATOCRIT: 38.8 % (ref 36.0–46.0)
Hemoglobin: 13.2 g/dL (ref 12.0–15.0)
LYMPHS ABS: 1 10*3/uL (ref 0.7–4.0)
Lymphocytes Relative: 34.1 % (ref 12.0–46.0)
MCHC: 34.1 g/dL (ref 30.0–36.0)
MCV: 89.8 fl (ref 78.0–100.0)
MONOS PCT: 14.1 % — AB (ref 3.0–12.0)
Monocytes Absolute: 0.4 10*3/uL (ref 0.1–1.0)
NEUTROS ABS: 1.4 10*3/uL (ref 1.4–7.7)
Neutrophils Relative %: 49.6 % (ref 43.0–77.0)
PLATELETS: 193 10*3/uL (ref 150.0–400.0)
RBC: 4.32 Mil/uL (ref 3.87–5.11)
RDW: 13.4 % (ref 11.5–15.5)
WBC: 2.9 10*3/uL — ABNORMAL LOW (ref 4.0–10.5)

## 2014-11-05 LAB — POCT URINALYSIS DIPSTICK
BILIRUBIN UA: NEGATIVE
Blood, UA: NEGATIVE
GLUCOSE UA: NEGATIVE
KETONES UA: NEGATIVE
Leukocytes, UA: NEGATIVE
Nitrite, UA: NEGATIVE
Protein, UA: NEGATIVE
SPEC GRAV UA: 1.01
UROBILINOGEN UA: 0.2
pH, UA: 6

## 2014-11-05 LAB — COMPREHENSIVE METABOLIC PANEL
ALT: 25 U/L (ref 0–35)
AST: 26 U/L (ref 0–37)
Albumin: 4.2 g/dL (ref 3.5–5.2)
Alkaline Phosphatase: 74 U/L (ref 39–117)
BUN: 19 mg/dL (ref 6–23)
CALCIUM: 9.6 mg/dL (ref 8.4–10.5)
CHLORIDE: 105 meq/L (ref 96–112)
CO2: 27 mEq/L (ref 19–32)
Creatinine, Ser: 0.85 mg/dL (ref 0.40–1.20)
GFR: 69.69 mL/min (ref >60.00)
GLUCOSE: 93 mg/dL (ref 70–99)
POTASSIUM: 4.2 meq/L (ref 3.5–5.1)
Sodium: 137 mEq/L (ref 135–145)
Total Bilirubin: 0.4 mg/dL (ref 0.2–1.2)
Total Protein: 7.3 g/dL (ref 6.0–8.3)

## 2014-11-05 LAB — HEMOGLOBIN A1C: Hgb A1c MFr Bld: 6 % (ref 4.6–6.5)

## 2014-11-05 NOTE — Addendum Note (Signed)
Addended by: Agnes Lawrence on: 11/05/2014 10:22 AM   Modules accepted: Orders

## 2014-11-05 NOTE — Progress Notes (Signed)
Pre visit review using our clinic review tool, if applicable. No additional management support is needed unless otherwise documented below in the visit note. 

## 2014-11-05 NOTE — Progress Notes (Signed)
Medicare Annual Preventive Care Visit  (initial annual wellness or annual wellness exam)  Concerns and/or follow up today:  Moving to [place] to be near grandchildren.  Arthritis/Lupus: -managed by rheumatologist, Dr. Luanna Salk and seeing Dr. Ronnie Derby in ortho for hip and knee OA -stable - reports just saw Dr. Ples Specter and since coming here opted to do CBC, UA and CMP here and wants [place] to fax these labs to rheum -meds: none  Prediabetes/Mild Dyslipidemia: -denies: polyuria, polydipsia, vision changes -getting some regular exercise and trying to be careful with sodas and sweetened beverages -takes fish oil and asa daily  Anxiety: -sees her gynecologist for this -meds: xanax -denies:  ROS: negative for report of fevers, unintentional weight loss, vision changes, vision loss, hearing loss or change, chest pain, sob, hemoptysis, melena, hematochezia, hematuria, genital discharge or lesions, falls, bleeding or bruising, loc, thoughts of suicide or self harm, memory loss  1.) Patient-completed health risk assessment  - completed and reviewed, see scanned documentation  2.) Review of Medical History: -PMH, PSH, Family History and current specialty and care providers reviewed and updated and listed below  - see scanned in document in chart and below  Past Medical History  Diagnosis Date  . Chicken pox   . Arthritis   . Lupus   . Migraines   . Anxiety state, unspecified - sees Dr. Nori Riis in gyn and benzo prescribed there 04/09/2013  . Prediabetes     Past Surgical History  Procedure Laterality Date  . Gallbladder surgery  1974  . Back surgery  2000  . Dilation and curettage of uterus  1973  . Cholecystectomy      Social History   Social History  . Marital Status: Married    Spouse Name: N/A  . Number of Children: N/A  . Years of Education: N/A   Occupational History  . Not on file.   Social History Main Topics  . Smoking status: Never Smoker   . Smokeless tobacco:  Never Used  . Alcohol Use: 1.2 oz/week    2 Glasses of wine per week  . Drug Use: No  . Sexual Activity: Not on file   Other Topics Concern  . 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 Elease Hashimoto      Spiritual Beliefs:       Lifestyle: no regular exercise             The patient has a family history of  3.) Review of functional ability and level of safety:  Any difficulty hearing? NO  History of falling? NO  Any trouble with IADLs - using a phone, using transportation, grocery shopping, preparing meals, doing housework, doing laundry, taking medications and managing money?  NO  Advance Directives? YES  See summary of recommendations in Patient Instructions below.  4.) Physical Exam Filed Vitals:   11/05/14 0807  BP: 108/78  Pulse: 74  Temp: 98.3 F (36.8 C)   Estimated body mass index is 24.78 kg/(m^2) as calculated from the following:   Height as of this encounter: 5' 3.25" (1.607 m).   Weight as of this encounter: 141 lb 1.6 oz (64.003 kg).  EKG (optional): deferred  General: alert, appear well hydrated and in no acute distress  HEENT: visual acuity grossly intact  CV: HRRR, no peripheral edema  Lungs: CTA bilaterally  Psych: pleasant and cooperative, no obvious depression or anxiety  ABD: BS+, soft, NTTP  Cognitive function grossly intact  See patient instructions for recommendations.  Education and counseling regarding the above review of health provided with a plan for the following: -see scanned patient completed form for further details -fall prevention strategies discussed  -healthy lifestyle discussed -importance and resources for completing advanced directives discussed -see patient instructions below for any other recommendations provided  4)The following written screening schedule of preventive measures were reviewed with assessment and plan made per  below, orders and patient instructions:      AAA screening: n/a     Alcohol screening: done - see scanned     Obesity Screening and counseling: n/a     STI screening (Hep C if born [birth year]): declined     Tobacco Screening: done - see scanned       Pneumococcal: ASSESSMENT/PLAN: done      Screening mammograph (yearly if >40) ASSESSMENT/PLAN: sees gyn - mammo due      Screening Pap smear/pelvic exam (q2 years) ASSESSMENT/PLAN: sees gyn      Prostate cancer screening ASSESSMENT/PLAN: n/a      Colorectal cancer screening (FOBT yearly or flex sig q4y or colonoscopy q10y or barium enema q4y) ASSESSMENT/PLAN: done      Diabetes outpatient self-management training services ASSESSMENT/PLAN: n/a      Bone mass measurements(covered q2y if indicated - estrogen def, osteoporosis, hyperparathyroid, vertebral abnormalities, osteoporosis or steroids) ASSESSMENT/PLAN: done last year with rheumatologist per notes      Screening for glaucoma(q1y if high risk - diabetes, FH, AA and > 50 or hispanic and > 65) ASSESSMENT/PLAN: sees opthomologist      Medical nutritional therapy for individuals with diabetes or renal disease ASSESSMENT/PLAN: n/a      Cardiovascular screening blood tests (lipids q5y) ASSESSMENT/PLAN: done last year      Diabetes screening tests ASSESSMENT/PLAN: done last year   7.) Summary:   Medicare annual wellness visit, subsequent -see scanned documentation --risk factors and conditions per above assessment were discussed and treatment, recommendations and referrals were offered per documentation above and orders and patient instructions.  Lupus - followed by rheum - Plan: CBC with Differential, CMP - will fax labs to rheum  Prediabetes - Plan: Hemoglobin A1c Hyperlipemia - Plan: Lipid Panel -lifestyle recs, labs  Anxiety state  Patient Instructions  BEFORE YOU LEAVE: Labs Urine dip  Please get you flu shot before October  We recommend the following  healthy lifestyle measures: - eat a healthy diet consisting of lots of vegetables, fruits, beans, nuts, seeds, healthy meats such as white chicken and fish and whole grains.  - avoid sweets, starches, fried foods, fast food, processed foods, sodas, red meet and other fattening foods.  - get a least 150 minutes of aerobic exercise per week.   Please call us once you have moved and are setting up care with a new doctor

## 2014-11-05 NOTE — Patient Instructions (Addendum)
BEFORE YOU LEAVE: Labs Urine dip  Please get you flu shot before October  We recommend the following healthy lifestyle measures: - eat a healthy diet consisting of lots of vegetables, fruits, beans, nuts, seeds, healthy meats such as white chicken and fish and whole grains.  - avoid sweets, starches, fried foods, fast food, processed foods, sodas, red meet and other fattening foods.  - get a least 150 minutes of aerobic exercise per week.   Please call us once you have moved and are setting up care with a new doctor

## 2014-11-07 ENCOUNTER — Other Ambulatory Visit: Payer: Self-pay | Admitting: Orthopedic Surgery

## 2014-11-07 DIAGNOSIS — M545 Low back pain: Secondary | ICD-10-CM

## 2014-11-12 ENCOUNTER — Ambulatory Visit
Admission: RE | Admit: 2014-11-12 | Discharge: 2014-11-12 | Disposition: A | Discharge status: Home / Self Care | Payer: Medicare HMO | Source: Ambulatory Visit | Attending: Orthopedic Surgery | Admitting: Orthopedic Surgery

## 2014-11-12 DIAGNOSIS — M545 Low back pain: Secondary | ICD-10-CM

## 2014-11-13 ENCOUNTER — Encounter: Payer: Self-pay | Admitting: *Deleted

## 2014-11-16 ENCOUNTER — Other Ambulatory Visit: Payer: Medicare HMO

## 2014-11-29 ENCOUNTER — Telehealth: Payer: Self-pay | Admitting: Family Medicine

## 2014-11-29 ENCOUNTER — Other Ambulatory Visit: Payer: Self-pay | Admitting: *Deleted

## 2014-11-29 ENCOUNTER — Other Ambulatory Visit: Payer: Medicare HMO

## 2014-11-29 DIAGNOSIS — D729 Disorder of white blood cells, unspecified: Secondary | ICD-10-CM

## 2014-11-29 NOTE — Telephone Encounter (Signed)
I called the pt and informed her of the message below and scheduled a lab appt for today at 3:45pm.

## 2014-11-29 NOTE — Telephone Encounter (Signed)
I advise she start calling now to set up a new PCP as she needs ongoing care. Ok to recheck cbc here once - but she must follow with PCP in new location within 3 months.

## 2014-11-29 NOTE — Telephone Encounter (Signed)
Patient came in today stating that she was supposed to have a lab drawn 30 days from last appointment. Patient stated that she wanted to have the blood drawn here before moving away cause it could be a while before getting a new PCP. Patient states that she will be here for a few more days.

## 2014-12-02 ENCOUNTER — Other Ambulatory Visit (INDEPENDENT_AMBULATORY_CARE_PROVIDER_SITE_OTHER): Payer: Medicare HMO

## 2014-12-02 DIAGNOSIS — D729 Disorder of white blood cells, unspecified: Secondary | ICD-10-CM

## 2014-12-02 LAB — CBC WITH DIFFERENTIAL/PLATELET
Basophils Absolute: 0 10*3/uL (ref 0.0–0.1)
Basophils Relative: 0.6 % (ref 0.0–3.0)
EOS ABS: 0 10*3/uL (ref 0.0–0.7)
Eosinophils Relative: 0.7 % (ref 0.0–5.0)
HCT: 37.3 % (ref 36.0–46.0)
Hemoglobin: 12.4 g/dL (ref 12.0–15.0)
Lymphocytes Relative: 24 % (ref 12.0–46.0)
Lymphs Abs: 1.1 10*3/uL (ref 0.7–4.0)
MCHC: 33.1 g/dL (ref 30.0–36.0)
MCV: 90.6 fl (ref 78.0–100.0)
MONO ABS: 0.3 10*3/uL (ref 0.1–1.0)
Monocytes Relative: 7.8 % (ref 3.0–12.0)
NEUTROS ABS: 3 10*3/uL (ref 1.4–7.7)
Neutrophils Relative %: 66.9 % (ref 43.0–77.0)
PLATELETS: 189 10*3/uL (ref 150.0–400.0)
RBC: 4.12 Mil/uL (ref 3.87–5.11)
RDW: 13.6 % (ref 11.5–15.5)
WBC: 4.5 10*3/uL (ref 4.0–10.5)

## 2016-11-25 ENCOUNTER — Encounter: Payer: Self-pay | Admitting: Family Medicine

## 2017-01-07 ENCOUNTER — Ambulatory Visit (INDEPENDENT_AMBULATORY_CARE_PROVIDER_SITE_OTHER): Payer: Medicare HMO

## 2017-01-07 DIAGNOSIS — Z23 Encounter for immunization: Secondary | ICD-10-CM

## 2017-01-24 NOTE — Progress Notes (Signed)
HPI:  Here for CPE: Due for labs, mammogram  -Concerns and/or follow up today: Not seen in several years.  Seeing Manuela Schwartz for her annual wellness visit today.  Arthritis/Lupus: -managed by rheumatologist, Dr. Luanna Salk and seeing Dr. Ronnie Derby in ortho for hip and knee OA -plans to see rheumatologist soon for follow up - was out of state for a few years helping with grandchildren, now back in [place]  Prediabetes/Mild Dyslipidemia: -denies: polyuria, polydipsia, vision changes -takes fish oil and asa daily  Anxiety: -sees her gynecologist for this -meds: xanax - uses about 3 days per week for anxiety, tingling sensation in upper back that resolves with xanax - but wishes to stop as heard causes dementia -denies: SI, thoughts of self harm -not doing counseling  -Diet: variety of foods, balance and well rounded, larger portion sizes -Exercise: no regular exercise but stays active -Taking folic acid, vitamin D or calcium: no -Diabetes and Dyslipidemia Screening: fasting for labs -Vaccines: see vaccine section EPIC -pap history: sees gyn - plans to schedule visit -FDLMP: see nursing notes -sexual activity: yes, female partner, no new partners -wants STI testing (Hep C if born [birth year]): no -FH breast, colon or ovarian ca: see FH Last mammogram: sees gyn for this Last colon cancer screening: Colonoscopy was in September 2015, normal, Dr. Fuller Plan, no further screening advised Breast Ca Risk Assessment: see family history and pt history DEXA (>/= 84): see health coach notes  -Alcohol, Tobacco, drug use: see social history  Review of Systems - no fevers, unintentional weight loss, vision loss, hearing loss, chest pain, sob, hemoptysis, melena, hematochezia, hematuria, genital discharge, changing or concerning skin lesions, bleeding, bruising, loc, thoughts of self harm or SI  Past Medical History:  Diagnosis Date  . Anxiety state, unspecified - sees Dr. Nori Riis in gyn and benzo  prescribed there 04/09/2013  . Arthritis   . Chicken pox   . Lupus   . Migraines   . Prediabetes     Past Surgical History:  Procedure Laterality Date  . BACK SURGERY  2000  . CHOLECYSTECTOMY    . DILATION AND CURETTAGE OF UTERUS  1973  . GALLBLADDER SURGERY  1974    Family History  Problem Relation Age of Onset  . Alcoholism Unknown   . Arthritis Unknown   . Hypertension Unknown   . Sudden death Unknown   . Arthritis Mother   . Diabetes Mother   . Heart disease Mother   . Heart attack Mother   . Colon cancer Neg Hx   . Pneumonia Father   . COPD Sister   . Heart disease Brother   . Pulmonary embolism Brother   . Alzheimer's disease Brother   . Alzheimer's disease Brother     Social History   Socioeconomic History  . Marital status: Married    Spouse name: None  . Number of children: None  . Years of education: None  . Highest education level: None  Social Needs  . Financial resource strain: None  . Food insecurity - worry: None  . Food insecurity - inability: None  . Transportation needs - medical: None  . Transportation needs - non-medical: None  Occupational History  . None  Tobacco Use  . Smoking status: Never Smoker  . Smokeless tobacco: Never Used  Substance and Sexual Activity  . Alcohol use: Yes    Alcohol/week: 1.2 oz    Types: 2 Glasses of wine per week  . Drug use: No  . Sexual activity: None  Other Topics Concern  . None  Social History Narrative   Work or School: Product manager Situation: lives with husband, husband sees Dr. Elease Hashimoto      Spiritual Beliefs:       Lifestyle: no regular exercise              Current Outpatient Medications:  .  ALPRAZolam (XANAX) 0.5 MG tablet, Take 0.5 mg by mouth as needed for anxiety. , Disp: , Rfl:  .  aspirin 81 MG tablet, Take 81 mg by mouth daily., Disp: , Rfl:  .  Cholecalciferol (VITAMIN D PO), Take by mouth daily., Disp: , Rfl:  .  co-enzyme Q-10 30 MG capsule, Take 30 mg by mouth  daily., Disp: , Rfl:  .  doxycycline (VIBRAMYCIN) 100 MG capsule, Take 100 mg by mouth 2 (two) times daily. , Disp: , Rfl:  .  Multiple Vitamin (MULTIVITAMIN) tablet, Take 1 tablet by mouth daily., Disp: , Rfl:  .  Omega-3 Fatty Acids (FISH OIL) 1200 MG CPDR, Take by mouth daily., Disp: , Rfl:   EXAM:  Vitals:   01/25/17 1013  BP: 98/68  Pulse: 69  Temp: 98.7 F (37.1 C)    GENERAL: vitals reviewed and listed below, alert, oriented, appears well hydrated and in no acute distress  HEENT: head atraumatic, PERRLA, normal appearance of eyes, ears, nose and mouth. moist mucus membranes.  NECK: supple, no masses or lymphadenopathy  LUNGS: clear to auscultation bilaterally, no rales, rhonchi or wheeze  CV: HRRR, no peripheral edema or cyanosis, normal pedal pulses  ABDOMEN: bowel sounds normal, soft, non tender to palpation, no masses, no rebound or guarding  GU/BREAST: declined, sees gyn  SKIN: no rash or abnormal lesions  MS: normal gait, moves all extremities normally  NEURO: normal gait, speech and thought processing grossly intact, muscle tone grossly intact throughout  PSYCH: normal affect, pleasant and cooperative  ASSESSMENT AND PLAN:  Discussed the following assessment and plan:  PREVENTIVE EXAM: -Discussed and advised all [place] preventive services health task force level A and B recommendations for age, sex and risks. -Advised at least 150 minutes of exercise per week and a healthy diet with avoidance of (less then 1 serving per week) processed foods, white starches, red meat, fast foods and sweets and consisting of: * 5-9 servings of fresh fruits and vegetables (not corn or potatoes) *nuts and seeds, beans *olives and olive oil *lean meats such as fish and white chicken  *whole grains -labs, studies and vaccines per orders this encounter  2. Screening for depression -see scanned document  3. Anxiety -discussed treatment options -she has concerns about xanax,  discussed risks benefits -advise trying CBT, consider lexapro, wean of xanax -follow up as needed here if wants to start SSRI  4. Prediabetes -lifestyle recs - Hemoglobin A1c  5. Lupus erythematosus, unspecified form -sees rheumatologist  Patient advised to return to clinic immediately if symptoms worsen or persist or new concerns.  Patient Instructions   BEFORE YOU LEAVE: -labs -AWV with Manuela Schwartz -follow up: yearly and as needed in interim  Call to schedule Cognitive Behavioral therapy for the Anxiety - then try to come off of the xanax. Call to schedule appointment here with me if you decide you would like to try a different type of medicine for this.  Get regular gentle exercise. Eat a healthy diet.  Schedule appointments with your gynecologist and your rheumatologist.   We recommend the following healthy lifestyle for LIFE:  1) Small portions. But, make sure to get regular (at least 3 per day), healthy meals and small healthy snacks if needed.  2) Eat a healthy clean diet.   TRY TO EAT: -at least 5-7 servings of low sugar, colorful, and nutrient rich vegetables per day (not corn, potatoes or bananas.) -berries are the best choice if you wish to eat fruit (only eat small amounts if trying to reduce weight)  -lean meets (fish, white meat of chicken or [place]) -vegan proteins for some meals - beans or tofu, whole grains, nuts and seeds -Replace bad fats with good fats - good fats include: fish, nuts and seeds, canola oil, olive oil -small amounts of low fat or non fat dairy -small amounts of100 % whole grains - check the lables -drink plenty of water  AVOID: -SUGAR, sweets, anything with added sugar, corn syrup or sweeteners - must read labels as even foods advertised as "healthy" often are loaded with sugar -if you must have a sweetener, small amounts of stevia may be best -sweetened beverages and artificially sweetened beverages -simple starches (rice, bread, potatoes,  pasta, chips, etc - small amounts of 100% whole grains are ok) -red meat, pork, butter -fried foods, fast food, processed food, excessive dairy, eggs and coconut.  3)Get at least 150 minutes of sweaty aerobic exercise per week.  4)Reduce stress - consider counseling, meditation and relaxation to balance other aspects of your life.   Ms. Argo , Thank you for taking time to come for your Medicare Wellness Visit. I appreciate your ongoing commitment to your health goals. Please review the following plan we discussed and let me know if I can assist you in the future.   Ad8 score reviewed for issues:  Issues making decisions:  Less interest in hobbies / activities:  Repeats questions, stories (family complaining):  Trouble using ordinary gadgets (microwave, computer, phone):  Forgets the month or year:   Mismanaging finances:   Remembering appts:  Daily problems with thinking and/or memory: Ad8 score is= 2 or more you may have an issue   Will fup with GYN in time Schedule Mammogram as directed by your doctors  Dexa; 05/2013 and it was normal  When you see your GYN, please get your results   Hx of singles;  Shingrix is a vaccine for the prevention of Shingles in Adults 50 and older.  If you are on Medicare, you can request a prescription from your doctor to be filled at a pharmacy.  Please check with your benefits regarding applicable copays or out of pocket expenses.  The Shingrix is given in 2 vaccines approx 8 weeks apart. You must receive the 2nd dose prior to 6 months from receipt of the first.   Educated regarding prediabetes and numbers;  A1c ranges from 5.8 to 6.5 or fasting Blood sugar > 115 -126; (126 is diabetic)   Risk: >45yo; family hx; overweight or obese; African American; Hispanic; Latino; American [place] American; [place]; history of diabetes when pregnant; or birth to a baby weighing over 9 lbs. Being less physically active than 30 minutes; 3  times a week;   Prevention; Losing a modest 7 to 8 lbs; If over 200 lbs; 10 to 14 lbs;  Choose healthier foods; colorful veggies; fish or lean meats; drinks water Reduce portion size Start exercising; 30 minutes of fast walking x 30 minutes per day/ 60 min for weight loss     These are the goals we discussed: Goals    .  DIET - REDUCE SUGAR INTAKE     And exercise Did yoga a few times and will get back to this to take time for yourself        This is a list of the screening recommended for you and due dates:  Health Maintenance  Topic Date Due  . Mammogram  04/27/2017*  . Tetanus Vaccine  10/23/2023  . Colon Cancer Screening  11/14/2023  . Flu Shot  Completed  . Pneumonia vaccines  Completed  . DEXA scan (bone density measurement)  Addressed  *Topic was postponed. The date shown is not the original due date.   Falls can be the main reason people lose their independence Think before you CLIMB  4 things you can do to prevent falls 1. Begin an exercise program to improve your leg strength and balance 2. Ask the doctor to review medicines for fall risk 3. Get an annual eye check up and update your eye glasses;  ([employer] still assist with eyewear:  Reviewed for annual vision exam;[facility] assistance for eyewear is coordinated through [facility]; Please call Cherlyn Labella at [phone] 4. Make your home safer by: Removing clutter and tripping hazzards Putting railing on stairs and adding grab bars to the bathroom  Have good lighting; especially on stairs and at night when getting up to the bathroom   Exercise; including walking can assist with maintaining tone and balance and help prevent falls as you age.           Health Maintenance for Postmenopausal Women Menopause is a normal process in which your reproductive ability comes to an end. This process happens gradually over a span of months to years, usually between the ages of 73 and 44. Menopause is complete  when you have missed 12 consecutive menstrual periods. It is important to talk with your health care provider about some of the most common conditions that affect postmenopausal women, such as heart disease, cancer, and bone loss (osteoporosis). Adopting a healthy lifestyle and getting preventive care can help to promote your health and wellness. Those actions can also lower your chances of developing some of these common conditions. What should I know about menopause? During menopause, you may experience a number of symptoms, such as:  Moderate-to-severe hot flashes.  Night sweats.  Decrease in sex drive.  Mood swings.  Headaches.  Tiredness.  Irritability.  Memory problems.  Insomnia.  Choosing to treat or not to treat menopausal changes is an individual decision that you make with your health care provider. What should I know about hormone replacement therapy and supplements? Hormone therapy products are effective for treating symptoms that are associated with menopause, such as hot flashes and night sweats. Hormone replacement carries certain risks, especially as you become older. If you are thinking about using estrogen or estrogen with progestin treatments, discuss the benefits and risks with your health care provider. What should I know about heart disease and stroke? Heart disease, heart attack, and stroke become more likely as you age. This may be due, in part, to the hormonal changes that your body experiences during menopause. These can affect how your body processes dietary fats, triglycerides, and cholesterol. Heart attack and stroke are both medical emergencies. There are many things that you can do to help prevent heart disease and stroke:  Have your blood pressure checked at least every 1-2 years. High blood pressure causes heart disease and increases the risk of stroke.  If you are 42-58 years old, ask  your health care provider if you should take aspirin to prevent a  heart attack or a stroke.  Do not use any tobacco products, including cigarettes, chewing tobacco, or electronic cigarettes. If you need help quitting, ask your health care provider.  It is important to eat a healthy diet and maintain a healthy weight. ? Be sure to include plenty of vegetables, fruits, low-fat dairy products, and lean protein. ? Avoid eating foods that are high in solid fats, added sugars, or salt (sodium).  Get regular exercise. This is one of the most important things that you can do for your health. ? Try to exercise for at least 150 minutes each week. The type of exercise that you do should increase your heart rate and make you sweat. This is known as moderate-intensity exercise. ? Try to do strengthening exercises at least twice each week. Do these in addition to the moderate-intensity exercise.  Know your numbers.Ask your health care provider to check your cholesterol and your blood glucose. Continue to have your blood tested as directed by your health care provider.  What should I know about cancer screening? There are several types of cancer. Take the following steps to reduce your risk and to catch any cancer development as early as possible. Breast Cancer  Practice breast self-awareness. ? This means understanding how your breasts normally appear and feel. ? It also means doing regular breast self-exams. Let your health care provider know about any changes, no matter how small.  If you are 4 or older, have a clinician do a breast exam (clinical breast exam or CBE) every year. Depending on your age, family history, and medical history, it may be recommended that you also have a yearly breast X-ray (mammogram).  If you have a family history of breast cancer, talk with your health care provider about genetic screening.  If you are at high risk for breast cancer, talk with your health care provider about having an MRI and a mammogram every year.  Breast cancer  (BRCA) gene test is recommended for women who have family members with BRCA-related cancers. Results of the assessment will determine the need for genetic counseling and BRCA1 and for BRCA2 testing. BRCA-related cancers include these types: ? Breast. This occurs in males or females. ? Ovarian. ? Tubal. This may also be called fallopian tube cancer. ? Cancer of the abdominal or pelvic lining (peritoneal cancer). ? Prostate. ? Pancreatic.  Cervical, Uterine, and Ovarian Cancer Your health care provider may recommend that you be screened regularly for cancer of the pelvic organs. These include your ovaries, uterus, and vagina. This screening involves a pelvic exam, which includes checking for microscopic changes to the surface of your cervix (Pap test).  For women ages 21-65, health care providers may recommend a pelvic exam and a Pap test every three years. For women ages 78-65, they may recommend the Pap test and pelvic exam, combined with testing for human papilloma virus (HPV), every five years. Some types of HPV increase your risk of cervical cancer. Testing for HPV may also be done on women of any age who have unclear Pap test results.  Other health care providers may not recommend any screening for nonpregnant women who are considered low risk for pelvic cancer and have no symptoms. Ask your health care provider if a screening pelvic exam is right for you.  If you have had past treatment for cervical cancer or a condition that could lead to cancer, you need Pap tests  and screening for cancer for at least 20 years after your treatment. If Pap tests have been discontinued for you, your risk factors (such as having a new sexual partner) need to be reassessed to determine if you should start having screenings again. Some women have medical problems that increase the chance of getting cervical cancer. In these cases, your health care provider may recommend that you have screening and Pap tests more  often.  If you have a family history of uterine cancer or ovarian cancer, talk with your health care provider about genetic screening.  If you have vaginal bleeding after reaching menopause, tell your health care provider.  There are currently no reliable tests available to screen for ovarian cancer.  Lung Cancer Lung cancer screening is recommended for adults 98-39 years old who are at high risk for lung cancer because of a history of smoking. A yearly low-dose CT scan of the lungs is recommended if you:  Currently smoke.  Have a history of at least 30 pack-years of smoking and you currently smoke or have quit within the past 15 years. A pack-year is smoking an average of one pack of cigarettes per day for one year.  Yearly screening should:  Continue until it has been 15 years since you quit.  Stop if you develop a health problem that would prevent you from having lung cancer treatment.  Colorectal Cancer  This type of cancer can be detected and can often be prevented.  Routine colorectal cancer screening usually begins at age 25 and continues through age 5.  If you have risk factors for colon cancer, your health care provider may recommend that you be screened at an earlier age.  If you have a family history of colorectal cancer, talk with your health care provider about genetic screening.  Your health care provider may also recommend using home test kits to check for hidden blood in your stool.  A small camera at the end of a tube can be used to examine your colon directly (sigmoidoscopy or colonoscopy). This is done to check for the earliest forms of colorectal cancer.  Direct examination of the colon should be repeated every 5-10 years until age 34. However, if early forms of precancerous polyps or small growths are found or if you have a family history or genetic risk for colorectal cancer, you may need to be screened more often.  Skin Cancer  Check your skin from head  to toe regularly.  Monitor any moles. Be sure to tell your health care provider: ? About any new moles or changes in moles, especially if there is a change in a mole's shape or color. ? If you have a mole that is larger than the size of a pencil eraser.  If any of your family members has a history of skin cancer, especially at a young age, talk with your health care provider about genetic screening.  Always use sunscreen. Apply sunscreen liberally and repeatedly throughout the day.  Whenever you are outside, protect yourself by wearing long sleeves, pants, a wide-brimmed hat, and sunglasses.  What should I know about osteoporosis? Osteoporosis is a condition in which bone destruction happens more quickly than new bone creation. After menopause, you may be at an increased risk for osteoporosis. To help prevent osteoporosis or the bone fractures that can happen because of osteoporosis, the following is recommended:  If you are 52-50 years old, get at least 1,000 mg of calcium and at least 600 mg of  vitamin D per day.  If you are older than age 5 but younger than age 66, get at least 1,200 mg of calcium and at least 600 mg of vitamin D per day.  If you are older than age 53, get at least 1,200 mg of calcium and at least 800 mg of vitamin D per day.  Smoking and excessive alcohol intake increase the risk of osteoporosis. Eat foods that are rich in calcium and vitamin D, and do weight-bearing exercises several times each week as directed by your health care provider. What should I know about how menopause affects my mental health? Depression may occur at any age, but it is more common as you become older. Common symptoms of depression include:  Low or sad mood.  Changes in sleep patterns.  Changes in appetite or eating patterns.  Feeling an overall lack of motivation or enjoyment of activities that you previously enjoyed.  Frequent crying spells.  Talk with your health care provider if  you think that you are experiencing depression. What should I know about immunizations? It is important that you get and maintain your immunizations. These include:  Tetanus, diphtheria, and pertussis (Tdap) booster vaccine.  Influenza every year before the flu season begins.  Pneumonia vaccine.  Shingles vaccine.  Your health care provider may also recommend other immunizations. This information is not intended to replace advice given to you by your health care provider. Make sure you discuss any questions you have with your health care provider. Document Released: 04/16/2005 Document Revised: 09/12/2015 Document Reviewed: 11/26/2014 Elsevier Interactive Patient Education  2018 [employer].      No Follow-up on file.  Colin Benton R., DO

## 2017-01-25 ENCOUNTER — Ambulatory Visit (INDEPENDENT_AMBULATORY_CARE_PROVIDER_SITE_OTHER): Payer: Medicare HMO | Admitting: Family Medicine

## 2017-01-25 ENCOUNTER — Encounter: Payer: Self-pay | Admitting: Family Medicine

## 2017-01-25 VITALS — BP 98/68 | HR 69 | Temp 98.7°F | Ht 63.0 in | Wt 140.0 lb

## 2017-01-25 DIAGNOSIS — L930 Discoid lupus erythematosus: Secondary | ICD-10-CM

## 2017-01-25 DIAGNOSIS — F419 Anxiety disorder, unspecified: Secondary | ICD-10-CM | POA: Diagnosis not present

## 2017-01-25 DIAGNOSIS — Z1331 Encounter for screening for depression: Secondary | ICD-10-CM | POA: Diagnosis not present

## 2017-01-25 DIAGNOSIS — Z0000 Encounter for general adult medical examination without abnormal findings: Secondary | ICD-10-CM

## 2017-01-25 DIAGNOSIS — R7303 Prediabetes: Secondary | ICD-10-CM

## 2017-01-25 DIAGNOSIS — R69 Illness, unspecified: Secondary | ICD-10-CM | POA: Diagnosis not present

## 2017-01-25 LAB — HEMOGLOBIN A1C: HEMOGLOBIN A1C: 5.9 % (ref 4.6–6.5)

## 2017-01-25 LAB — LIPID PANEL
CHOLESTEROL: 187 mg/dL (ref 0–200)
HDL: 34.9 mg/dL — ABNORMAL LOW (ref >39.00)
LDL Cholesterol: 128 mg/dL — ABNORMAL HIGH (ref 0–99)
NONHDL: 152.49
Total CHOL/HDL Ratio: 5
Triglycerides: 124 mg/dL (ref 0.0–149.0)
VLDL: 24.8 mg/dL (ref 0.0–40.0)

## 2017-01-25 NOTE — Progress Notes (Signed)
Subjective:   Tara Martin is a 76 y.o. female who presents for Medicare Annual (Subsequent) preventive examination.  The Patient was informed that the wellness visit is to identify future health risk and educate and initiate measures that can reduce risk for increased disease through the lifespan.    Annual Wellness Assessment  Reports health as good overall  Preventive Screening -Counseling & Management  Medicare Annual Preventive Care Visit - Subsequent Last OV today  Stress is her spouse  losing his vision x 12 years Ist still fairly independent  Dtr is 18; [place] x 2 years Just came back to the home in [place]  76 yo and [place] as poor, fair, good or great? Good   VS reviewed;   Diet  Eats well, but will cut back on sugar  Educated regarding pre-diabetes   BMI 24   Exercise - will go back to Yoga May try other classes as well  Hearing Screening Comments: Hearing issues none Vision Screening Comments: Vision issues Have a cataract  Dr. Pricilla [facility]    Dental - has check up regularly   Stressors: 1-5 is a 2 today   Sleep patterns: sleeps well   Pain- no    Cardiac Risk Factors Addressed Hyperlipidemia - hdl 35 (discussed exercise Pre-diabetes -A1c 6 - recheck today  Obesity- neg  Advanced Directives completed but will need to update   Patient Care Team: Lucretia Kern, DO as PCP - General (Family Medicine) Bo Merino, MD as Consulting Physician (Rheumatology) Maisie Fus, MD as Consulting Physician (Obstetrics and Gynecology)   Dr. Nori Riis most likely following Dexa;  The patient will schedule GYN and confirm Dexa was normal        Objective:     Vitals: BP 98/68 (BP Location: Left Arm, Patient Position: Sitting, Cuff Size: Normal)   Pulse 69   Temp 98.7 F (37.1 C) (Oral)   Ht 5\' 3"  (1.6 m)   Wt 140 lb (63.5 kg)   BMI 24.80 kg/m   Body mass index is 24.8 kg/m.   Tobacco Social History     Tobacco Use  Smoking Status Never Smoker  Smokeless Tobacco Never Used     Counseling given: Yes   Past Medical History:  Diagnosis Date  . Anxiety state, unspecified - sees Dr. Nori Riis in gyn and benzo prescribed there 04/09/2013  . Arthritis   . Chicken pox   . Lupus   . Migraines   . Prediabetes    Past Surgical History:  Procedure Laterality Date  . BACK SURGERY  2000  . CHOLECYSTECTOMY    . DILATION AND CURETTAGE OF UTERUS  1973  . GALLBLADDER SURGERY  1974   Family History  Problem Relation Age of Onset  . Alcoholism Unknown   . Arthritis Unknown   . Hypertension Unknown   . Sudden death Unknown   . Arthritis Mother   . Diabetes Mother   . Heart disease Mother   . Heart attack Mother   . Colon cancer Neg Hx   . Pneumonia Father   . COPD Sister   . Heart disease Brother   . Pulmonary embolism Brother   . Alzheimer's disease Brother   . Alzheimer's disease Brother    Social History   Substance and Sexual Activity  Sexual Activity Not on file    Outpatient Encounter Medications as of 01/25/2017  Medication Sig  . ALPRAZolam (XANAX) 0.5 MG tablet Take 0.5  mg by mouth as needed for anxiety.   Marland Kitchen aspirin 81 MG tablet Take 81 mg by mouth daily.  . Cholecalciferol (VITAMIN D PO) Take by mouth daily.  Marland Kitchen co-enzyme Q-10 30 MG capsule Take 30 mg by mouth daily.  Marland Kitchen doxycycline (VIBRAMYCIN) 100 MG capsule Take 100 mg by mouth 2 (two) times daily.   . Multiple Vitamin (MULTIVITAMIN) tablet Take 1 tablet by mouth daily.  . Omega-3 Fatty Acids (FISH OIL) 1200 MG CPDR Take by mouth daily.   No facility-administered encounter medications on file as of 01/25/2017.     Activities of Daily Living In your present state of health, do you have any difficulty performing the following activities: 01/25/2017  Hearing? N  Vision? N  Difficulty concentrating or making decisions? N  Walking or climbing stairs? N  Dressing or bathing? N  Doing errands, shopping? N  Preparing  Food and eating ? N  Using the Toilet? N  In the past six months, have you accidently leaked urine? N  Do you have problems with loss of bowel control? N  Managing your Medications? N  Managing your Finances? N  Housekeeping or managing your Housekeeping? N  Some recent data might be hidden    Patient Care Team: Lucretia Kern, DO as PCP - General (Family Medicine) Bo Merino, MD as Consulting Physician (Rheumatology) Maisie Fus, MD as Consulting Physician (Obstetrics and Gynecology)    Assessment:     Exercise Activities and Dietary recommendations Current Exercise Habits: Home exercise routine;Structured exercise class  Goals    . DIET - REDUCE SUGAR INTAKE     And exercise Did yoga a few times and will get back to this to take time for yourself       Fall Risk Fall Risk  01/25/2017 04/23/2014  Falls in the past year? No No   Depression Screen PHQ 2/9 Scores 01/25/2017 04/23/2014  PHQ - 2 Score 0 0     Cognitive Function MMSE - Mini Mental State Exam 01/25/2017  Not completed: (No Data)     Ad8 score reviewed for issues:  Issues making decisions:  Less interest in hobbies / activities:  Repeats questions, stories (family complaining):  Trouble using ordinary gadgets (microwave, computer, phone):  Forgets the month or year:   Mismanaging finances:   Remembering appts:  Daily problems with thinking and/or memory: Ad8 score is=0        Immunization History  Administered Date(s) Administered  . Influenza, High Dose Seasonal PF 01/07/2017  . Pneumococcal Conjugate-13 10/22/2013  . Pneumococcal Polysaccharide-23 08/06/2008  . Tdap 08/07/2003, 10/22/2013   Screening Tests Health Maintenance  Topic Date Due  . MAMMOGRAM  04/27/2017 (Originally 09/06/2015)  . TETANUS/TDAP  10/23/2023  . COLONOSCOPY  11/14/2023  . INFLUENZA VACCINE  Completed  . PNA vac Low Risk Adult  Completed  . DEXA SCAN  Addressed      Plan:       PCP  Notes   Health Maintenance Had had flu Vaccine  Had mild episode of shingles; Educated regarding the shingrix  Mammogram; 09/2014; states she was told by the radiologist that due to dense breast, she can do SBE and these were effective. Reviewed guidelines; She is to schedule with her GYN and will fup there on recommendations on Mammograms Also will fup with GYN on DEXA which she feels was normal   Abnormal Screens  HDL low and A1c 6; both to be rechecked   Referrals  None  Patient concerns; Stress but education on meditation techniques which help to reduce stress in the long term. Is also thinking about yoga   Nurse Concerns; Educated regarding pre-diabetes; BS level to be drawn today  Recommended walking briskly x 30 minutes 5 days a week or cutting back on sugar   Next PCP apt  Seen today;     I have personally reviewed and noted the following in the patient's chart:   . Medical and social history . Use of alcohol, tobacco or illicit drugs  . Current medications and supplements . Functional ability and status . Nutritional status . Physical activity . Advanced directives . List of other physicians . Hospitalizations, surgeries, and ER visits in previous 12 months . Vitals . Screenings to include cognitive, depression, and falls . Referrals and appointments  In addition, I have reviewed and discussed with patient certain preventive protocols, quality metrics, and best practice recommendations. A written personalized care plan for preventive services as well as general preventive health recommendations were provided to patient.     Wynetta Fines, RN  01/25/2017

## 2017-01-25 NOTE — Patient Instructions (Addendum)
BEFORE YOU LEAVE: -labs -AWV with Manuela Schwartz -follow up: yearly and as needed in interim  Call to schedule Cognitive Behavioral therapy for the Anxiety - then try to come off of the xanax. Call to schedule appointment here with me if you decide you would like to try a different type of medicine for this.  Get regular gentle exercise. Eat a healthy diet.  Schedule appointments with your gynecologist and your rheumatologist.   We recommend the following healthy lifestyle for LIFE: 1) Small portions. But, make sure to get regular (at least 3 per day), healthy meals and small healthy snacks if needed.  2) Eat a healthy clean diet.   TRY TO EAT: -at least 5-7 servings of low sugar, colorful, and nutrient rich vegetables per day (not corn, potatoes or bananas.) -berries are the best choice if you wish to eat fruit (only eat small amounts if trying to reduce weight)  -lean meets (fish, white meat of chicken or [place]) -vegan proteins for some meals - beans or tofu, whole grains, nuts and seeds -Replace bad fats with good fats - good fats include: fish, nuts and seeds, canola oil, olive oil -small amounts of low fat or non fat dairy -small amounts of100 % whole grains - check the lables -drink plenty of water  AVOID: -SUGAR, sweets, anything with added sugar, corn syrup or sweeteners - must read labels as even foods advertised as "healthy" often are loaded with sugar -if you must have a sweetener, small amounts of stevia may be best -sweetened beverages and artificially sweetened beverages -simple starches (rice, bread, potatoes, pasta, chips, etc - small amounts of 100% whole grains are ok) -red meat, pork, butter -fried foods, fast food, processed food, excessive dairy, eggs and coconut.  3)Get at least 150 minutes of sweaty aerobic exercise per week.  4)Reduce stress - consider counseling, meditation and relaxation to balance other aspects of your life.   Tara Martin , Thank you for  taking time to come for your Medicare Wellness Visit. I appreciate your ongoing commitment to your health goals. Please review the following plan we discussed and let me know if I can assist you in the future.   Ad8 score reviewed for issues:  Issues making decisions:  Less interest in hobbies / activities:  Repeats questions, stories (family complaining):  Trouble using ordinary gadgets (microwave, computer, phone):  Forgets the month or year:   Mismanaging finances:   Remembering appts:  Daily problems with thinking and/or memory: Ad8 score is= 2 or more you may have an issue   Will fup with GYN in time Schedule Mammogram as directed by your doctors  Dexa; 05/2013 and it was normal  When you see your GYN, please get your results   Hx of singles;  Shingrix is a vaccine for the prevention of Shingles in Adults 50 and older.  If you are on Medicare, you can request a prescription from your doctor to be filled at a pharmacy.  Please check with your benefits regarding applicable copays or out of pocket expenses.  The Shingrix is given in 2 vaccines approx 8 weeks apart. You must receive the 2nd dose prior to 6 months from receipt of the first.   Educated regarding prediabetes and numbers;  A1c ranges from 5.8 to 6.5 or fasting Blood sugar > 115 -126; (126 is diabetic)   Risk: >45yo; family hx; overweight or obese; African American; Hispanic; Latino; American [place] American; [place]; history of diabetes when pregnant; or  birth to a baby weighing over 9 lbs. Being less physically active than 30 minutes; 3 times a week;   Prevention; Losing a modest 7 to 8 lbs; If over 200 lbs; 10 to 14 lbs;  Choose healthier foods; colorful veggies; fish or lean meats; drinks water Reduce portion size Start exercising; 30 minutes of fast walking x 30 minutes per day/ 60 min for weight loss     These are the goals we discussed: Goals    . DIET - REDUCE SUGAR INTAKE     And  exercise Did yoga a few times and will get back to this to take time for yourself        This is a list of the screening recommended for you and due dates:  Health Maintenance  Topic Date Due  . Mammogram  04/27/2017*  . Tetanus Vaccine  10/23/2023  . Colon Cancer Screening  11/14/2023  . Flu Shot  Completed  . Pneumonia vaccines  Completed  . DEXA scan (bone density measurement)  Addressed  *Topic was postponed. The date shown is not the original due date.   Falls can be the main reason people lose their independence Think before you CLIMB  4 things you can do to prevent falls 1. Begin an exercise program to improve your leg strength and balance 2. Ask the doctor to review medicines for fall risk 3. Get an annual eye check up and update your eye glasses;  (the [employer] still assist with eyewear:  Reviewed for annual vision exam;The [facility] assistance for eyewear is coordinated through [facility]; Please call Cherlyn Labella at [phone] 4. Make your home safer by: Removing clutter and tripping hazzards Putting railing on stairs and adding grab bars to the bathroom  Have good lighting; especially on stairs and at night when getting up to the bathroom   Exercise; including walking can assist with maintaining tone and balance and help prevent falls as you age.           Health Maintenance for Postmenopausal Women Menopause is a normal process in which your reproductive ability comes to an end. This process happens gradually over a span of months to years, usually between the ages of 66 and 50. Menopause is complete when you have missed 12 consecutive menstrual periods. It is important to talk with your health care provider about some of the most common conditions that affect postmenopausal women, such as heart disease, cancer, and bone loss (osteoporosis). Adopting a healthy lifestyle and getting preventive care can help to promote your health and wellness. Those actions can also  lower your chances of developing some of these common conditions. What should I know about menopause? During menopause, you may experience a number of symptoms, such as:  Moderate-to-severe hot flashes.  Night sweats.  Decrease in sex drive.  Mood swings.  Headaches.  Tiredness.  Irritability.  Memory problems.  Insomnia.  Choosing to treat or not to treat menopausal changes is an individual decision that you make with your health care provider. What should I know about hormone replacement therapy and supplements? Hormone therapy products are effective for treating symptoms that are associated with menopause, such as hot flashes and night sweats. Hormone replacement carries certain risks, especially as you become older. If you are thinking about using estrogen or estrogen with progestin treatments, discuss the benefits and risks with your health care provider. What should I know about heart disease and stroke? Heart disease, heart attack, and stroke become more likely  as you age. This may be due, in part, to the hormonal changes that your body experiences during menopause. These can affect how your body processes dietary fats, triglycerides, and cholesterol. Heart attack and stroke are both medical emergencies. There are many things that you can do to help prevent heart disease and stroke:  Have your blood pressure checked at least every 1-2 years. High blood pressure causes heart disease and increases the risk of stroke.  If you are 17-54 years old, ask your health care provider if you should take aspirin to prevent a heart attack or a stroke.  Do not use any tobacco products, including cigarettes, chewing tobacco, or electronic cigarettes. If you need help quitting, ask your health care provider.  It is important to eat a healthy diet and maintain a healthy weight. ? Be sure to include plenty of vegetables, fruits, low-fat dairy products, and lean protein. ? Avoid eating foods  that are high in solid fats, added sugars, or salt (sodium).  Get regular exercise. This is one of the most important things that you can do for your health. ? Try to exercise for at least 150 minutes each week. The type of exercise that you do should increase your heart rate and make you sweat. This is known as moderate-intensity exercise. ? Try to do strengthening exercises at least twice each week. Do these in addition to the moderate-intensity exercise.  Know your numbers.Ask your health care provider to check your cholesterol and your blood glucose. Continue to have your blood tested as directed by your health care provider.  What should I know about cancer screening? There are several types of cancer. Take the following steps to reduce your risk and to catch any cancer development as early as possible. Breast Cancer  Practice breast self-awareness. ? This means understanding how your breasts normally appear and feel. ? It also means doing regular breast self-exams. Let your health care provider know about any changes, no matter how small.  If you are 62 or older, have a clinician do a breast exam (clinical breast exam or CBE) every year. Depending on your age, family history, and medical history, it may be recommended that you also have a yearly breast X-ray (mammogram).  If you have a family history of breast cancer, talk with your health care provider about genetic screening.  If you are at high risk for breast cancer, talk with your health care provider about having an MRI and a mammogram every year.  Breast cancer (BRCA) gene test is recommended for women who have family members with BRCA-related cancers. Results of the assessment will determine the need for genetic counseling and BRCA1 and for BRCA2 testing. BRCA-related cancers include these types: ? Breast. This occurs in males or females. ? Ovarian. ? Tubal. This may also be called fallopian tube cancer. ? Cancer of the  abdominal or pelvic lining (peritoneal cancer). ? Prostate. ? Pancreatic.  Cervical, Uterine, and Ovarian Cancer Your health care provider may recommend that you be screened regularly for cancer of the pelvic organs. These include your ovaries, uterus, and vagina. This screening involves a pelvic exam, which includes checking for microscopic changes to the surface of your cervix (Pap test).  For women ages 21-65, health care providers may recommend a pelvic exam and a Pap test every three years. For women ages 24-65, they may recommend the Pap test and pelvic exam, combined with testing for human papilloma virus (HPV), every five years. Some types of HPV  increase your risk of cervical cancer. Testing for HPV may also be done on women of any age who have unclear Pap test results.  Other health care providers may not recommend any screening for nonpregnant women who are considered low risk for pelvic cancer and have no symptoms. Ask your health care provider if a screening pelvic exam is right for you.  If you have had past treatment for cervical cancer or a condition that could lead to cancer, you need Pap tests and screening for cancer for at least 20 years after your treatment. If Pap tests have been discontinued for you, your risk factors (such as having a new sexual partner) need to be reassessed to determine if you should start having screenings again. Some women have medical problems that increase the chance of getting cervical cancer. In these cases, your health care provider may recommend that you have screening and Pap tests more often.  If you have a family history of uterine cancer or ovarian cancer, talk with your health care provider about genetic screening.  If you have vaginal bleeding after reaching menopause, tell your health care provider.  There are currently no reliable tests available to screen for ovarian cancer.  Lung Cancer Lung cancer screening is recommended for adults  76-79 years old who are at high risk for lung cancer because of a history of smoking. A yearly low-dose CT scan of the lungs is recommended if you:  Currently smoke.  Have a history of at least 30 pack-years of smoking and you currently smoke or have quit within the past 15 years. A pack-year is smoking an average of one pack of cigarettes per day for one year.  Yearly screening should:  Continue until it has been 15 years since you quit.  Stop if you develop a health problem that would prevent you from having lung cancer treatment.  Colorectal Cancer  This type of cancer can be detected and can often be prevented.  Routine colorectal cancer screening usually begins at age 6 and continues through age 45.  If you have risk factors for colon cancer, your health care provider may recommend that you be screened at an earlier age.  If you have a family history of colorectal cancer, talk with your health care provider about genetic screening.  Your health care provider may also recommend using home test kits to check for hidden blood in your stool.  A small camera at the end of a tube can be used to examine your colon directly (sigmoidoscopy or colonoscopy). This is done to check for the earliest forms of colorectal cancer.  Direct examination of the colon should be repeated every 5-10 years until age 56. However, if early forms of precancerous polyps or small growths are found or if you have a family history or genetic risk for colorectal cancer, you may need to be screened more often.  Skin Cancer  Check your skin from head to toe regularly.  Monitor any moles. Be sure to tell your health care provider: ? About any new moles or changes in moles, especially if there is a change in a mole's shape or color. ? If you have a mole that is larger than the size of a pencil eraser.  If any of your family members has a history of skin cancer, especially at a young age, talk with your health  care provider about genetic screening.  Always use sunscreen. Apply sunscreen liberally and repeatedly throughout the day.  Whenever you  are outside, protect yourself by wearing long sleeves, pants, a wide-brimmed hat, and sunglasses.  What should I know about osteoporosis? Osteoporosis is a condition in which bone destruction happens more quickly than new bone creation. After menopause, you may be at an increased risk for osteoporosis. To help prevent osteoporosis or the bone fractures that can happen because of osteoporosis, the following is recommended:  If you are 73-38 years old, get at least 1,000 mg of calcium and at least 600 mg of vitamin D per day.  If you are older than age 21 but younger than age 96, get at least 1,200 mg of calcium and at least 600 mg of vitamin D per day.  If you are older than age 20, get at least 1,200 mg of calcium and at least 800 mg of vitamin D per day.  Smoking and excessive alcohol intake increase the risk of osteoporosis. Eat foods that are rich in calcium and vitamin D, and do weight-bearing exercises several times each week as directed by your health care provider. What should I know about how menopause affects my mental health? Depression may occur at any age, but it is more common as you become older. Common symptoms of depression include:  Low or sad mood.  Changes in sleep patterns.  Changes in appetite or eating patterns.  Feeling an overall lack of motivation or enjoyment of activities that you previously enjoyed.  Frequent crying spells.  Talk with your health care provider if you think that you are experiencing depression. What should I know about immunizations? It is important that you get and maintain your immunizations. These include:  Tetanus, diphtheria, and pertussis (Tdap) booster vaccine.  Influenza every year before the flu season begins.  Pneumonia vaccine.  Shingles vaccine.  Your health care provider may also  recommend other immunizations. This information is not intended to replace advice given to you by your health care provider. Make sure you discuss any questions you have with your health care provider. Document Released: 04/16/2005 Document Revised: 09/12/2015 Document Reviewed: 11/26/2014 Elsevier Interactive Patient Education  2018 [employer].

## 2017-02-03 ENCOUNTER — Telehealth: Payer: Self-pay | Admitting: *Deleted

## 2017-02-03 NOTE — Telephone Encounter (Signed)
I called the pt and informed her of the results. Tara Martin      Copied from [place]. Topic: General - Other >> Feb 03, 2017 12:45 PM Synthia Innocent wrote: Reason for CRM: Requesting lab results

## 2017-02-09 DIAGNOSIS — L723 Sebaceous cyst: Secondary | ICD-10-CM | POA: Diagnosis not present

## 2017-02-09 DIAGNOSIS — C44622 Squamous cell carcinoma of skin of right upper limb, including shoulder: Secondary | ICD-10-CM | POA: Diagnosis not present

## 2017-02-09 DIAGNOSIS — D485 Neoplasm of uncertain behavior of skin: Secondary | ICD-10-CM | POA: Diagnosis not present

## 2017-02-17 NOTE — Progress Notes (Signed)
Office Visit Note  Patient: Tara Martin             Date of Birth: [birth year]           MRN: [id number]             PCP: Tara Kern, DO Referring: Tara Kern, DO Visit Date: 02/18/2017 Occupation: @GUAROCC @    Subjective:  Other (doing good )   History of Present Illness: Tara Martin is a 76 y.o. female with history of systemic lupus or dermatosis and osteoarthritis. She returns today after her last visit in July 2016. She states she's been living in [place] and was seeing a rheumatologist there. She was having sharp bone pain in her bilateral lower extremities. She was taken off the Plaquenil by her rheumatologist. She continues to do well. She states she's been having some crawling sensation between her shoulder blades. She has noticed increasing stiffness in her hands. She denies any joint swelling. Her knee joints are doing well. She states she has occasional lower back pain. She denies any recurrence of rash.  Activities of Daily Living:  Patient reports morning stiffness for 0 minute.   Patient Denies nocturnal pain.  Difficulty dressing/grooming: Denies Difficulty climbing stairs: Denies Difficulty getting out of chair: Denies Difficulty using hands for taps, buttons, cutlery, and/or writing: Denies   Review of Systems  Constitutional: Negative for fatigue, night sweats, weight gain, weight loss and weakness.  HENT: Positive for mouth dryness. Negative for mouth sores, trouble swallowing, trouble swallowing and nose dryness.   Eyes: Negative for pain, redness, visual disturbance and dryness.  Respiratory: Negative for cough, shortness of breath and difficulty breathing.   Cardiovascular: Negative for chest pain, palpitations, hypertension, irregular heartbeat and swelling in legs/feet.  Gastrointestinal: Negative for blood in stool, constipation and diarrhea.  Endocrine: Negative for increased urination.  Genitourinary: Negative for vaginal dryness.    Musculoskeletal: Positive for arthralgias and joint pain. Negative for joint swelling, myalgias, muscle weakness, morning stiffness, muscle tenderness and myalgias.  Skin: Negative for color change, rash, hair loss, skin tightness, ulcers and sensitivity to sunlight.  Allergic/Immunologic: Negative for susceptible to infections.  Neurological: Negative for dizziness, memory loss and night sweats.  Hematological: Negative for swollen glands.  Psychiatric/Behavioral: Negative for depressed mood and sleep disturbance. The patient is not nervous/anxious.     PMFS History:  Patient Active Problem List   Diagnosis Date Noted  . Facial numbness 02/11/2014  . Prediabetes   . Lupus - followed by rheum 04/09/2013  . Anxiety state 04/09/2013    Past Medical History:  Diagnosis Date  . Anxiety state, unspecified - sees Dr. Nori Riis in gyn and benzo prescribed there 04/09/2013  . Arthritis   . Chicken pox   . Lupus   . Migraines   . Prediabetes     Family History  Problem Relation Age of Onset  . Arthritis Mother   . Diabetes Mother   . Heart disease Mother   . Heart attack Mother   . Pneumonia Father   . COPD Sister   . Heart disease Brother   . Pulmonary embolism Brother   . Alzheimer's disease Brother   . Alzheimer's disease Brother   . Alcoholism Unknown   . Arthritis Unknown   . Hypertension Unknown   . Sudden death Unknown   . Migraines Daughter   . Colon cancer Neg Hx    Past Surgical History:  Procedure Laterality Date  . BACK SURGERY  2000  . CHOLECYSTECTOMY    . DILATION AND CURETTAGE OF UTERUS  1973  . [place]   Social History   Social History Narrative   Work or School: Product manager Situation: lives with husband, husband sees Dr. Elease Hashimoto      Spiritual Beliefs:       Lifestyle: no regular exercise              Objective: Vital Signs: BP 124/71 (BP Location: Right Arm, Patient Position: Sitting, Cuff Size: Normal)   Pulse 77    Resp 18   Ht 5\' 4"  (1.626 m)   Wt 143 lb (64.9 kg)   BMI 24.55 kg/m    Physical Exam  Constitutional: She is oriented to person, place, and time. She appears well-developed and well-nourished.  HENT:  Head: Normocephalic and atraumatic.  Eyes: Conjunctivae and EOM are normal.  Neck: Normal range of motion.  Cardiovascular: Normal rate, regular rhythm, normal heart sounds and intact distal pulses.  Pulmonary/Chest: Effort normal and breath sounds normal.  Abdominal: Soft. Bowel sounds are normal.  Lymphadenopathy:    She has no cervical adenopathy.  Neurological: She is alert and oriented to person, place, and time.  Skin: Skin is warm and dry. Capillary refill takes less than 2 seconds.  Psychiatric: She has a normal mood and affect. Her behavior is normal.  Nursing note and vitals reviewed.    Musculoskeletal Exam: C-spine and lumbar spine some limitation and stiffness with range of motion. Shoulder joints elbow joints wrist joints are good range of motion. She has DIP PIP thickening in her hands with no synovitis. Hip joints knee joints ankles MTPs PIPs with good range of motion with no synovitis.  CDAI Exam: No CDAI exam completed.    Investigation: No additional findings.   Imaging: No results found.  Speciality Comments: No specialty comments available.    Procedures:  No procedures performed Allergies: Codeine; Penicillins; and Sulfa antibiotics   Assessment / Plan:     Visit Diagnoses: Other systemic lupus erythematosus with other organ involvement (HCC) - +ANA, +Ro, photo, raynauds, MR, neutropenia  - she has been off Plaquenil for the last couple of years. She's had no flares. The medication was stopped due to lower extremity pain by her rheumatologist. She's been experiencing increased joint pain recently. I do not see any synovitis on examination. I believe the discomfort is coming from underlying osteoarthritis. I will obtain following labs today. Plan: CBC  with Differential/Platelet, COMPLETE METABOLIC PANEL WITH GFR, Urinalysis, Routine w reflex microscopic, C3 and C4, Anti-DNA antibody, double-stranded, Sedimentation rate, Sjogrens syndrome-A extractable nuclear antibody, ANA  DLE (discoid lupus erythematosus): She has no active lesions.  Primary osteoarthritis of both hands: She has severe osteoarthritis in her hands which cause discomfort. Joint protection and muscle strengthening discussed.  Primary osteoarthritis of both knees: The pain is tolerable.  DDD (degenerative disc disease), cervical: She has some cervical and thoracic pain. She plans to see a neurologist.  DDD (degenerative disc disease), lumbar - s/p fusion : She is limited range of motion without much discomfort.  Psoriasis: She has no active lesions currently.  History of scoliosis  History of asthma  History of cataract  Height loss - Plan: VITAMIN D 25 Hydroxy (Vit-D Deficiency, Fractures), DG DXA FRACTURE ASSESSMENT    Orders: Orders Placed This Encounter  Procedures  . DG DXA FRACTURE ASSESSMENT  . CBC with Differential/Platelet  . COMPLETE METABOLIC PANEL WITH GFR  .  Urinalysis, Routine w reflex microscopic  . C3 and C4  . Anti-DNA antibody, double-stranded  . Sedimentation rate  . Sjogrens syndrome-A extractable nuclear antibody  . VITAMIN D 25 Hydroxy (Vit-D Deficiency, Fractures)  . ANA   Meds ordered this encounter  Medications  . diclofenac sodium (VOLTAREN) 1 % GEL    Sig: Apply 3 gm to 3 large joints up to 3 times a day.Dispense 3 tubes with 3 refills.    Dispense:  3 Tube    Refill:  1    Face-to-face time spent with patient was 30 minutes. Greater than 50% of time was spent in counseling and coordination of care.  Follow-Up Instructions: Return in about 1 year (around 02/18/2018) for Systemic lupus, Osteoarthritis.   Bo Merino, MD  Note - This record has been created using Editor, commissioning.  Chart creation errors have been  sought, but may not always  have been located. Such creation errors do not reflect on  the standard of medical care.

## 2017-02-18 ENCOUNTER — Ambulatory Visit: Payer: Medicare HMO | Admitting: Rheumatology

## 2017-02-18 ENCOUNTER — Encounter: Payer: Self-pay | Admitting: Rheumatology

## 2017-02-18 VITALS — BP 124/71 | HR 77 | Resp 18 | Ht 64.0 in | Wt 143.0 lb

## 2017-02-18 DIAGNOSIS — Z8669 Personal history of other diseases of the nervous system and sense organs: Secondary | ICD-10-CM | POA: Diagnosis not present

## 2017-02-18 DIAGNOSIS — Z8739 Personal history of other diseases of the musculoskeletal system and connective tissue: Secondary | ICD-10-CM

## 2017-02-18 DIAGNOSIS — L409 Psoriasis, unspecified: Secondary | ICD-10-CM

## 2017-02-18 DIAGNOSIS — M19042 Primary osteoarthritis, left hand: Secondary | ICD-10-CM

## 2017-02-18 DIAGNOSIS — M170 Bilateral primary osteoarthritis of knee: Secondary | ICD-10-CM | POA: Diagnosis not present

## 2017-02-18 DIAGNOSIS — L930 Discoid lupus erythematosus: Secondary | ICD-10-CM

## 2017-02-18 DIAGNOSIS — M5136 Other intervertebral disc degeneration, lumbar region: Secondary | ICD-10-CM | POA: Diagnosis not present

## 2017-02-18 DIAGNOSIS — M3219 Other organ or system involvement in systemic lupus erythematosus: Secondary | ICD-10-CM

## 2017-02-18 DIAGNOSIS — M19041 Primary osteoarthritis, right hand: Secondary | ICD-10-CM | POA: Diagnosis not present

## 2017-02-18 DIAGNOSIS — Z8709 Personal history of other diseases of the respiratory system: Secondary | ICD-10-CM | POA: Diagnosis not present

## 2017-02-18 DIAGNOSIS — M5030 Other cervical disc degeneration, unspecified cervical region: Secondary | ICD-10-CM | POA: Diagnosis not present

## 2017-02-18 DIAGNOSIS — R29890 Loss of height: Secondary | ICD-10-CM | POA: Diagnosis not present

## 2017-02-18 MED ORDER — DICLOFENAC SODIUM 1 % TD GEL: TRANSDERMAL | 1 refills | Status: DC

## 2017-02-18 NOTE — Patient Instructions (Signed)
Natural anti-inflammatories  You can purchase these at [employer], [employer] or online.  . Turmeric (capsules)  . Ginger (ginger root or capsules)  . Omega 3 (Fish, flax seeds, chia seeds, walnuts, almonds)  . Tart cherry (dried or extract)   Patient should be under the care of a physician while taking these supplements. This may not be reproduced without the permission of Dr. Macallan Ord.  

## 2017-02-21 LAB — COMPLETE METABOLIC PANEL WITH GFR
AG Ratio: 1.5 (calc) (ref 1.0–2.5)
ALKALINE PHOSPHATASE (APISO): 79 U/L (ref 33–130)
ALT: 20 U/L (ref 6–29)
AST: 24 U/L (ref 10–35)
Albumin: 4.1 g/dL (ref 3.6–5.1)
BUN: 17 mg/dL (ref 7–25)
CO2: 24 mmol/L (ref 20–32)
CREATININE: 0.86 mg/dL (ref 0.60–0.93)
Calcium: 9.2 mg/dL (ref 8.6–10.4)
Chloride: 106 mmol/L (ref 98–110)
GFR, Est African American: 77 mL/min/{1.73_m2} (ref >=60)
GFR, Est Non African American: 66 mL/min/{1.73_m2} (ref >=60)
GLOBULIN: 2.7 g/dL (ref 1.9–3.7)
GLUCOSE: 89 mg/dL (ref 65–99)
Potassium: 4.1 mmol/L (ref 3.5–5.3)
Sodium: 137 mmol/L (ref 135–146)
Total Bilirubin: 0.3 mg/dL (ref 0.2–1.2)
Total Protein: 6.8 g/dL (ref 6.1–8.1)

## 2017-02-21 LAB — ANTI-NUCLEAR AB-TITER (ANA TITER)

## 2017-02-21 LAB — CBC WITH DIFFERENTIAL/PLATELET
Basophils Absolute: 42 cells/uL (ref 0–200)
Basophils Relative: 1.1 %
Eosinophils Absolute: 91 cells/uL (ref 15–500)
Eosinophils Relative: 2.4 %
HCT: 35.7 % (ref 35.0–45.0)
Hemoglobin: 11.9 g/dL (ref 11.7–15.5)
Lymphs Abs: 901 cells/uL (ref 850–3900)
MCH: 29.8 pg (ref 27.0–33.0)
MCHC: 33.3 g/dL (ref 32.0–36.0)
MCV: 89.3 fL (ref 80.0–100.0)
MPV: 10.7 fL (ref 7.5–12.5)
Monocytes Relative: 8.3 %
NEUTROS PCT: 64.5 %
Neutro Abs: 2451 cells/uL (ref 1500–7800)
PLATELETS: 214 10*3/uL (ref 140–400)
RBC: 4 10*6/uL (ref 3.80–5.10)
RDW: 12.8 % (ref 11.0–15.0)
TOTAL LYMPHOCYTE: 23.7 %
WBC mixed population: 315 cells/uL (ref 200–950)
WBC: 3.8 10*3/uL (ref 3.8–10.8)

## 2017-02-21 LAB — URINALYSIS, ROUTINE W REFLEX MICROSCOPIC
BACTERIA UA: NONE SEEN /HPF
Bilirubin Urine: NEGATIVE
Glucose, UA: NEGATIVE
Hgb urine dipstick: NEGATIVE
Hyaline Cast: NONE SEEN /LPF
KETONES UR: NEGATIVE
Nitrite: NEGATIVE
Protein, ur: NEGATIVE
RBC / HPF: NONE SEEN /HPF (ref 0–2)
Specific Gravity, Urine: 1.011 (ref 1.001–1.03)
pH: <=5 (ref 5.0–8.0)

## 2017-02-21 LAB — SJOGRENS SYNDROME-A EXTRACTABLE NUCLEAR ANTIBODY: SSA (Ro) (ENA) Antibody, IgG: >8 AI — AB

## 2017-02-21 LAB — ANA: ANA: POSITIVE — AB

## 2017-02-21 LAB — SEDIMENTATION RATE: Sed Rate: 28 mm/h (ref 0–30)

## 2017-02-21 LAB — C3 AND C4
C3 COMPLEMENT: 130 mg/dL (ref 83–193)
C4 Complement: 25 mg/dL (ref 15–57)

## 2017-02-21 LAB — ANTI-DNA ANTIBODY, DOUBLE-STRANDED: ds DNA Ab: 2 IU/mL

## 2017-02-21 LAB — VITAMIN D 25 HYDROXY (VIT D DEFICIENCY, FRACTURES): Vit D, 25-Hydroxy: 32 ng/mL (ref 30–100)

## 2017-03-17 ENCOUNTER — Encounter: Payer: Self-pay | Admitting: Family Medicine

## 2017-03-23 DIAGNOSIS — Z780 Asymptomatic menopausal state: Secondary | ICD-10-CM | POA: Diagnosis not present

## 2017-03-23 LAB — HM DEXA SCAN

## 2017-03-31 DIAGNOSIS — R69 Illness, unspecified: Secondary | ICD-10-CM | POA: Diagnosis not present

## 2017-04-04 ENCOUNTER — Encounter: Payer: Self-pay | Admitting: Family Medicine

## 2017-04-05 DIAGNOSIS — R69 Illness, unspecified: Secondary | ICD-10-CM | POA: Diagnosis not present

## 2017-04-06 DIAGNOSIS — C44622 Squamous cell carcinoma of skin of right upper limb, including shoulder: Secondary | ICD-10-CM | POA: Diagnosis not present

## 2017-04-06 DIAGNOSIS — L988 Other specified disorders of the skin and subcutaneous tissue: Secondary | ICD-10-CM | POA: Diagnosis not present

## 2017-04-07 ENCOUNTER — Telehealth: Payer: Self-pay | Admitting: *Deleted

## 2017-04-07 NOTE — Telephone Encounter (Signed)
Bone Density T-Score -0.6   WNL

## 2017-04-08 NOTE — Telephone Encounter (Signed)
Patient advised of results.

## 2017-05-24 DIAGNOSIS — M4150 Other secondary scoliosis, site unspecified: Secondary | ICD-10-CM | POA: Diagnosis not present

## 2017-05-24 DIAGNOSIS — R030 Elevated blood-pressure reading, without diagnosis of hypertension: Secondary | ICD-10-CM | POA: Diagnosis not present

## 2017-05-24 DIAGNOSIS — M4316 Spondylolisthesis, lumbar region: Secondary | ICD-10-CM | POA: Diagnosis not present

## 2017-05-24 DIAGNOSIS — M48061 Spinal stenosis, lumbar region without neurogenic claudication: Secondary | ICD-10-CM | POA: Diagnosis not present

## 2017-06-01 ENCOUNTER — Ambulatory Visit (INDEPENDENT_AMBULATORY_CARE_PROVIDER_SITE_OTHER): Payer: Medicare HMO | Admitting: Family Medicine

## 2017-06-01 ENCOUNTER — Ambulatory Visit: Payer: Self-pay | Admitting: *Deleted

## 2017-06-01 VITALS — BP 130/80 | HR 61 | Temp 97.8°F | Wt 137.9 lb

## 2017-06-01 DIAGNOSIS — R0789 Other chest pain: Secondary | ICD-10-CM | POA: Diagnosis not present

## 2017-06-01 DIAGNOSIS — R197 Diarrhea, unspecified: Secondary | ICD-10-CM

## 2017-06-01 NOTE — Telephone Encounter (Signed)
Pt called with having 2 episodes of diarrhea this morning. She was feeling weak and having chills. She checked her b/p and it was 98/58. She also stated that she had a pain in her middle back up to her right shoulder blade.  She felt sweaty and felt like a hot flash. She put an ASA under her tongue. She felt a nauseous but no vomitus. No other c/o. She fasted yesterday. On Monday she had a chef salad and did not have any problems with that.  Just before she called, she had checked her b/p again and it was 120/61 and just before hanging up, she was 132/65. Appointment made for today with Dr. Elease Hashimoto, per pt request.  Reason for Disposition . [1] MILD diarrhea (e.g., 1-3 or more stools than normal in past 24 hours) without known cause AND [2] present >  7 days  Answer Assessment - Initial Assessment Questions 1. DIARRHEA SEVERITY: "How bad is the diarrhea?" "How many extra stools have you had in the past 24 hours than normal?"    - MILD: Few loose or mushy BMs; increase of 1-3 stools over normal daily number of stools; mild increase in ostomy output.   - MODERATE: Increase of 4-6 stools daily over normal; moderate increase in ostomy output.   - SEVERE (or Worst Possible): Increase of 7 or more stools daily over normal; moderate increase in ostomy output; incontinence.     2 episodes of diarrhea today 2. ONSET: "When did the diarrhea begin?"      This morning 3. BM CONSISTENCY: "How loose or watery is the diarrhea?"      loose 4. VOMITING: "Are you also vomiting?" If so, ask: "How many times in the past 24 hours?"      A little nauseated no vomitus 5. ABDOMINAL PAIN: "Are you having any abdominal pain?" If yes: "What does it feel like?" (e.g., crampy, dull, intermittent, constant)      No pain 6. ABDOMINAL PAIN SEVERITY: If present, ask: "How bad is the pain?"  (e.g., Scale 1-10; mild, moderate, or severe)    - MILD (1-3): doesn't interfere with normal activities, abdomen soft and not tender to  touch     - MODERATE (4-7): interferes with normal activities or awakens from sleep, tender to touch     - SEVERE (8-10): excruciating pain, doubled over, unable to do any normal activities       none 7. ORAL INTAKE: If vomiting, "Have you been able to drink liquids?" "How much fluids have you had in the past 24 hours?"     Had hot tea this morning and a drink of water before this started 8. HYDRATION: "Any signs of dehydration?" (e.g., dry mouth [not just dry lips], too weak to stand, dizziness, new weight loss) "When did you last urinate?"     no 9. EXPOSURE: "Have you traveled to a foreign country recently?" "Have you been exposed to anyone with diarrhea?" "Could you have eaten any food that was spoiled?"     no 10. OTHER SYMPTOMS: "Do you have any other symptoms?" (e.g., fever, blood in stool)       no 11. PREGNANCY: "Is there any chance you are pregnant?" "When was your last menstrual period?"       no  Protocols used: DIARRHEA-A-AH

## 2017-06-01 NOTE — Patient Instructions (Signed)
Food Choices to Help Relieve Diarrhea, Adult  When you have diarrhea, the foods you eat and your eating habits are very important. Choosing the right foods and drinks can help:  · Relieve diarrhea.  · Replace lost fluids and nutrients.  · Prevent dehydration.    What general guidelines should I follow?  Relieving diarrhea  · Choose foods with less than 2 g or .07 oz. of fiber per serving.  · Limit fats to less than 8 tsp (38 g or 1.34 oz.) a day.  · Avoid the following:  ? Foods and beverages sweetened with high-fructose corn syrup, honey, or sugar alcohols such as xylitol, sorbitol, and mannitol.  ? Foods that contain a lot of fat or sugar.  ? Fried, greasy, or spicy foods.  ? High-fiber grains, breads, and cereals.  ? Raw fruits and vegetables.  · Eat foods that are rich in probiotics. These foods include dairy products such as yogurt and fermented milk products. They help increase healthy bacteria in the stomach and intestines (gastrointestinal tract, or GI tract).  · If you have lactose intolerance, avoid dairy products. These may make your diarrhea worse.  · Take medicine to help stop diarrhea (antidiarrheal medicine) only as told by your health care provider.  Replacing nutrients  · Eat small meals or snacks every 3–4 hours.  · Eat bland foods, such as white rice, toast, or baked potato, until your diarrhea starts to get better. Gradually reintroduce nutrient-rich foods as tolerated or as told by your health care provider. This includes:  ? Well-cooked protein foods.  ? Peeled, seeded, and soft-cooked fruits and vegetables.  ? Low-fat dairy products.  · Take vitamin and mineral supplements as told by your health care provider.  Preventing dehydration    · Start by sipping water or a special solution to prevent dehydration (oral rehydration solution, ORS). Urine that is clear or pale yellow means that you are getting enough fluid.  · Try to drink at least 8–10 cups of fluid each day to help replace lost  fluids.  · You may add other liquids in addition to water, such as clear juice or decaffeinated sports drinks, as tolerated or as told by your health care provider.  · Avoid drinks with caffeine, such as coffee, tea, or soft drinks.  · Avoid alcohol.  What foods are recommended?  The items listed may not be a complete list. Talk with your health care provider about what dietary choices are best for you.  Grains  White rice. White, French, or pita breads (fresh or toasted), including plain rolls, buns, or bagels. White pasta. Saltine, soda, or graham crackers. Pretzels. Low-fiber cereal. Cooked cereals made with water (such as cornmeal, farina, or cream cereals). Plain muffins. Matzo. Melba toast. Zwieback.  Vegetables  Potatoes (without the skin). Most well-cooked and canned vegetables without skins or seeds. Tender lettuce.  Fruits  Apple sauce. Fruits canned in juice. Cooked apricots, cherries, grapefruit, peaches, pears, or plums. Fresh bananas and cantaloupe.  Meats and other protein foods  Baked or boiled chicken. Eggs. Tofu. Fish. Seafood. Smooth nut butters. Ground or well-cooked tender beef, ham, veal, lamb, pork, or poultry.  Dairy  Plain yogurt, kefir, and unsweetened liquid yogurt. Lactose-free milk, buttermilk, skim milk, or soy milk. Low-fat or nonfat hard cheese.  Beverages  Water. Low-calorie sports drinks. Fruit juices without pulp. Strained tomato and vegetable juices. Decaffeinated teas. Sugar-free beverages not sweetened with sugar alcohols. Oral rehydration solutions, if approved by your health care   provider.  Seasoning and other foods  Bouillon, broth, or soups made from recommended foods.  What foods are not recommended?  The items listed may not be a complete list. Talk with your health care provider about what dietary choices are best for you.  Grains  Whole grain, whole wheat, bran, or rye breads, rolls, pastas, and crackers. Wild or brown rice. Whole grain or bran cereals. Barley. Oats  and oatmeal. Corn tortillas or taco shells. Granola. Popcorn.  Vegetables  Raw vegetables. Fried vegetables. Cabbage, broccoli, Brussels sprouts, artichokes, baked beans, beet greens, corn, kale, legumes, peas, sweet potatoes, and yams. Potato skins. Cooked spinach and cabbage.  Fruits  Dried fruit, including raisins and dates. Raw fruits. Stewed or dried prunes. Canned fruits with syrup.  Meat and other protein foods  Fried or fatty meats. Deli meats. Chunky nut butters. Nuts and seeds. Beans and lentils. Bacon. Hot dogs. Sausage.  Dairy  High-fat cheeses. Whole milk, chocolate milk, and beverages made with milk, such as milk shakes. Half-and-half. Cream. sour cream. Ice cream.  Beverages  Caffeinated beverages (such as coffee, tea, soda, or energy drinks). Alcoholic beverages. Fruit juices with pulp. Prune juice. Soft drinks sweetened with high-fructose corn syrup or sugar alcohols. High-calorie sports drinks.  Fats and oils  Butter. Cream sauces. Margarine. Salad oils. Plain salad dressings. Olives. Avocados. Mayonnaise.  Sweets and desserts  Sweet rolls, doughnuts, and sweet breads. Sugar-free desserts sweetened with sugar alcohols such as xylitol and sorbitol.  Seasoning and other foods  Honey. Hot sauce. Chili powder. Gravy. Cream-based or milk-based soups. Pancakes and waffles.  Summary  · When you have diarrhea, the foods you eat and your eating habits are very important.  · Make sure you get at least 8–10 cups of fluid each day, or enough to keep your urine clear or pale yellow.  · Eat bland foods and gradually reintroduce healthy, nutrient-rich foods as tolerated, or as told by your health care provider.  · Avoid high-fiber, fried, greasy, or spicy foods.  This information is not intended to replace advice given to you by your health care provider. Make sure you discuss any questions you have with your health care provider.  Document Released: 05/15/2003 Document Revised: 02/20/2016 Document  Reviewed: 02/20/2016  Elsevier Interactive Patient Education © 2018 [employer]

## 2017-06-01 NOTE — Progress Notes (Signed)
Subjective:     Patient ID: Tara Martin, female   DOB: 1940-12-01, 77 y.o.   MRN: [id number]  HPI Patient is seen with episode of diarrhea this morning. She states that yesterday she had a 1 day fast of foods but drank plenty of liquids. This morning after having some tea went to bathroom and had episode of diarrhea. This was nonbloody. No associated abdominal pain. No fevers or chills. She had some slight nausea but no vomiting. No abdominal pain. This is followed by some lightheadedness. She felt slightly anxious. Her blood pressure at home was 98/50.  No recent antibiotics or travels.  Her lightheadedness and low blood pressure episode was followed by some mild shortness of breath and chest pressure which is somewhat diffuse across her chest. No radiation to the back or neck or arm. Patient has no known cardiac history. Patient had myocardial perfusion study 12/15 which was normal. She is nonsmoker. She has history of reported prediabetes but not diabetes. Last A1c 5.9%. She had 2 brothers with coronary disease but they were up in age-70s and 80s.  Past Medical History:  Diagnosis Date  . Anxiety state, unspecified - sees Dr. Nori Riis in gyn and benzo prescribed there 04/09/2013  . Arthritis   . Chicken pox   . Lupus   . Migraines   . Prediabetes    Past Surgical History:  Procedure Laterality Date  . BACK SURGERY  2000  . CHOLECYSTECTOMY    . DILATION AND CURETTAGE OF UTERUS  1973  . [place]    reports that she has never smoked. She has never used smokeless tobacco. She reports that she drinks about 1.2 oz of alcohol per week. She reports that she does not use drugs. family history includes Alcoholism in her unknown relative; Alzheimer's disease in her brother and brother; Arthritis in her mother and unknown relative; COPD in her sister; Diabetes in her mother; Heart attack in her mother; Heart disease in her brother and mother; Hypertension in her unknown relative;  Migraines in her daughter; Pneumonia in her father; Pulmonary embolism in her brother; Sudden death in her unknown relative. Allergies  Allergen Reactions  . Codeine     Per patient made her "flip out"  . Penicillins Hives  . Sulfa Antibiotics Hives     Review of Systems  Constitutional: Positive for fatigue. Negative for chills, fever and unexpected weight change.  Respiratory: Negative for shortness of breath.   Cardiovascular: Negative for palpitations and leg swelling.  Gastrointestinal: Positive for diarrhea and nausea. Negative for abdominal pain, blood in stool and vomiting.  Genitourinary: Negative for dysuria.  Neurological: Negative for syncope.  Hematological: Negative for adenopathy.  Psychiatric/Behavioral: Negative for dysphoric mood. The patient is nervous/anxious.        Objective:   Physical Exam  Constitutional: She appears well-developed and well-nourished.  HENT:  Mouth/Throat: Oropharynx is clear and moist.  Cardiovascular: Normal rate and regular rhythm.  No murmur heard. Pulmonary/Chest: Effort normal and breath sounds normal. No respiratory distress. She has no wheezes. She has no rales.  Abdominal: Soft. Bowel sounds are normal. She exhibits no mass. There is no tenderness. There is no rebound and no guarding.  Musculoskeletal: She exhibits no edema.       Assessment:     #1 transient diarrhea-2 episodes as above but none since then  #2 transient hypotension-blood pressure here today 122/60 left arm seated and then standing 115/60  #3 mild sensation of chest pressure.  She's had similar episodes in the past with unremarkable evaluation 2015.  She feels this may have been anxiety related. EKG shows sinus rhythm and no significant changes compared with prior tracing 2015    Plan:     -We recommended she try to push fluids and try some bland foods to make sure keeping down first. Gradually advance diet as tolerated -Follow-up for any recurrent  hypotension or other concerns -follow up immediately for any recurrent or progressive chest symptoms.  Eulas Post MD [facility]

## 2017-06-02 DIAGNOSIS — R69 Illness, unspecified: Secondary | ICD-10-CM | POA: Diagnosis not present

## 2017-06-23 DIAGNOSIS — Z6824 Body mass index (BMI) 24.0-24.9, adult: Secondary | ICD-10-CM | POA: Diagnosis not present

## 2017-06-23 DIAGNOSIS — N8189 Other female genital prolapse: Secondary | ICD-10-CM | POA: Diagnosis not present

## 2017-06-23 DIAGNOSIS — Z124 Encounter for screening for malignant neoplasm of cervix: Secondary | ICD-10-CM | POA: Diagnosis not present

## 2017-07-14 DIAGNOSIS — D259 Leiomyoma of uterus, unspecified: Secondary | ICD-10-CM | POA: Diagnosis not present

## 2017-07-14 DIAGNOSIS — R69 Illness, unspecified: Secondary | ICD-10-CM | POA: Diagnosis not present

## 2017-07-14 DIAGNOSIS — R87619 Unspecified abnormal cytological findings in specimens from cervix uteri: Secondary | ICD-10-CM | POA: Diagnosis not present

## 2017-07-25 DIAGNOSIS — R69 Illness, unspecified: Secondary | ICD-10-CM | POA: Diagnosis not present

## 2017-07-27 DIAGNOSIS — L820 Inflamed seborrheic keratosis: Secondary | ICD-10-CM | POA: Diagnosis not present

## 2017-07-27 DIAGNOSIS — L723 Sebaceous cyst: Secondary | ICD-10-CM | POA: Diagnosis not present

## 2017-07-27 DIAGNOSIS — L237 Allergic contact dermatitis due to plants, except food: Secondary | ICD-10-CM | POA: Diagnosis not present

## 2017-07-27 DIAGNOSIS — L719 Rosacea, unspecified: Secondary | ICD-10-CM | POA: Diagnosis not present

## 2017-08-26 DIAGNOSIS — W57XXXA Bitten or stung by nonvenomous insect and other nonvenomous arthropods, initial encounter: Secondary | ICD-10-CM | POA: Diagnosis not present

## 2017-08-26 DIAGNOSIS — L820 Inflamed seborrheic keratosis: Secondary | ICD-10-CM | POA: Diagnosis not present

## 2017-08-26 DIAGNOSIS — S80862A Insect bite (nonvenomous), left lower leg, initial encounter: Secondary | ICD-10-CM | POA: Diagnosis not present

## 2017-09-29 DIAGNOSIS — H25011 Cortical age-related cataract, right eye: Secondary | ICD-10-CM | POA: Diagnosis not present

## 2017-09-29 DIAGNOSIS — H524 Presbyopia: Secondary | ICD-10-CM | POA: Diagnosis not present

## 2017-09-29 DIAGNOSIS — H2511 Age-related nuclear cataract, right eye: Secondary | ICD-10-CM | POA: Diagnosis not present

## 2017-09-29 DIAGNOSIS — H25041 Posterior subcapsular polar age-related cataract, right eye: Secondary | ICD-10-CM | POA: Diagnosis not present

## 2017-09-30 ENCOUNTER — Telehealth: Payer: Self-pay | Admitting: Family Medicine

## 2017-09-30 NOTE — Telephone Encounter (Signed)
Copied from [place] [phone]. Topic: General - Other >> Sep 30, 2017 10:15 AM Carolyn Stare wrote:  Pt call to say she had a conversations with Dr Maudie Mercury about ADD and would like a referral to see someone

## 2017-10-02 NOTE — Telephone Encounter (Signed)
For testing and non-medication treatment Dr. Glennon Hamilton is the best.  If she prefers prescription medication,  I would recommend [facility] or [facility]. She can call to schedule directly - please provide her then numbers.

## 2017-10-03 NOTE — Telephone Encounter (Signed)
I called the pt and informed her of the message below.  She was given phone numbers for [facility] and [facility].

## 2017-11-04 DIAGNOSIS — Z79899 Other long term (current) drug therapy: Secondary | ICD-10-CM | POA: Diagnosis not present

## 2017-11-04 DIAGNOSIS — R41840 Attention and concentration deficit: Secondary | ICD-10-CM | POA: Diagnosis not present

## 2018-02-06 ENCOUNTER — Ambulatory Visit: Payer: Medicare HMO

## 2018-02-06 NOTE — Progress Notes (Deleted)
Office Visit Note  Patient: Tara Martin             Date of Birth: 12-23-1940           MRN: [id number]             PCP: Lucretia Kern, DO Referring: Lucretia Kern, DO Visit Date: 02/20/2018 Occupation: @GUAROCC @  Subjective:  No chief complaint on file.   History of Present Illness: Tara Martin is a 77 y.o. female ***   Activities of Daily Living:  Patient reports morning stiffness for *** {minute/hour:19697}.   Patient {ACTIONS;DENIES/REPORTS:21021675::"Denies"} nocturnal pain.  Difficulty dressing/grooming: {ACTIONS;DENIES/REPORTS:21021675::"Denies"} Difficulty climbing stairs: {ACTIONS;DENIES/REPORTS:21021675::"Denies"} Difficulty getting out of chair: {ACTIONS;DENIES/REPORTS:21021675::"Denies"} Difficulty using hands for taps, buttons, cutlery, and/or writing: {ACTIONS;DENIES/REPORTS:21021675::"Denies"}  No Rheumatology ROS completed.   PMFS History:  Patient Active Problem List   Diagnosis Date Noted  . Facial numbness 02/11/2014  . Prediabetes   . Lupus - followed by rheum 04/09/2013  . Anxiety state 04/09/2013    Past Medical History:  Diagnosis Date  . Anxiety state, unspecified - sees Dr. Nori Riis in gyn and benzo prescribed there 04/09/2013  . Arthritis   . Chicken pox   . Lupus   . Migraines   . Prediabetes     Family History  Problem Relation Age of Onset  . Arthritis Mother   . Diabetes Mother   . Heart disease Mother   . Heart attack Mother   . Pneumonia Father   . COPD Sister   . Heart disease Brother   . Pulmonary embolism Brother   . Alzheimer's disease Brother   . Alzheimer's disease Brother   . Alcoholism Unknown   . Arthritis Unknown   . Hypertension Unknown   . Sudden death Unknown   . Migraines Daughter   . Colon cancer Neg Hx    Past Surgical History:  Procedure Laterality Date  . BACK SURGERY  2000  . CHOLECYSTECTOMY    . DILATION AND CURETTAGE OF UTERUS  1973  . [place]   Social History   Social  History Narrative   Work or School: Product manager Situation: lives with husband, husband sees Dr. Elease Hashimoto      Spiritual Beliefs:       Lifestyle: no regular exercise             Objective: Vital Signs: There were no vitals taken for this visit.   Physical Exam   Musculoskeletal Exam: ***  CDAI Exam: CDAI Score: Not documented Patient Global Assessment: Not documented; Provider Global Assessment: Not documented Swollen: Not documented; Tender: Not documented Joint Exam   Not documented   There is currently no information documented on the homunculus. Go to the Rheumatology activity and complete the homunculus joint exam.  Investigation: No additional findings.  Imaging: No results found.  Recent Labs: Lab Results  Component Value Date   WBC 3.8 02/18/2017   HGB 11.9 02/18/2017   PLT 214 02/18/2017   NA 137 02/18/2017   K 4.1 02/18/2017   CL 106 02/18/2017   CO2 24 02/18/2017   GLUCOSE 89 02/18/2017   BUN 17 02/18/2017   CREATININE 0.86 02/18/2017   BILITOT 0.3 02/18/2017   ALKPHOS 74 11/05/2014   AST 24 02/18/2017   ALT 20 02/18/2017   PROT 6.8 02/18/2017   ALBUMIN 4.2 11/05/2014   CALCIUM 9.2 02/18/2017   GFRAA 77 02/18/2017    Speciality Comments: No specialty comments  available.  Procedures:  No procedures performed Allergies: Codeine; Penicillins; and Sulfa antibiotics   Assessment / Plan:     Visit Diagnoses: No diagnosis found.   Orders: No orders of the defined types were placed in this encounter.  No orders of the defined types were placed in this encounter.   Face-to-face time spent with patient was *** minutes. Greater than 50% of time was spent in counseling and coordination of care.  Follow-Up Instructions: No follow-ups on file.   Earnestine Mealing, CMA  Note - This record has been created using Editor, commissioning.  Chart creation errors have been sought, but may not always  have been located. Such creation errors do not  reflect on  the standard of medical care.

## 2018-02-08 ENCOUNTER — Telehealth: Payer: Self-pay | Admitting: Neurology

## 2018-02-08 ENCOUNTER — Ambulatory Visit: Payer: Medicare HMO | Admitting: Neurology

## 2018-02-08 ENCOUNTER — Encounter: Payer: Self-pay | Admitting: Neurology

## 2018-02-08 ENCOUNTER — Other Ambulatory Visit: Payer: Self-pay

## 2018-02-08 VITALS — BP 135/76 | HR 70 | Resp 16 | Ht 64.0 in | Wt 143.0 lb

## 2018-02-08 DIAGNOSIS — E538 Deficiency of other specified B group vitamins: Secondary | ICD-10-CM

## 2018-02-08 DIAGNOSIS — R413 Other amnesia: Secondary | ICD-10-CM

## 2018-02-08 NOTE — Progress Notes (Signed)
Reason for visit: Memory disturbance  Referring physician: Dr. Delton Prairie is a 77 y.o. female  History of present illness:  Ms. Leopard is a 77 year old right-handed white female with a history of an attention deficit disorder.  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  She has retired 4 or 5 years ago.  The patient has never been good at remembering names for people.  After retirement, she feels that her ability to focus has worsened, she has had increasing problems with short-term memory.  The patient has occasionally bumped her head when she has stooped over to pick up something, she has never been rendered unconscious.  She has not had any severe concussion issues with a motor vehicle accident or fall.  The patient does have some occasional neck pain.  She reports no focal numbness or weakness of the face, arms, legs.  She denies balance issues or difficulty controlling the bowels or the bladder.  She is able to keep up with her medications and appointments, she is able to do the finances without difficulty.  She denies any issues with driving with safety or with directions.  She however does have a very prominent family history of Alzheimer's disease, 2 brothers died with Alzheimer's and she has a sister who is living with Alzheimer's disease.  The patient is sent to this office for an evaluation.  Past Medical History:  Diagnosis Date  . Anxiety state, unspecified - sees Dr. Nori Riis in gyn and benzo prescribed there 04/09/2013  . Arthritis   . Chicken pox   . Lupus ([place])   . Migraines   . Prediabetes     Past Surgical History:  Procedure Laterality Date  . BACK SURGERY  2000  . CHOLECYSTECTOMY    . DILATION AND CURETTAGE OF UTERUS  1973  . GALLBLADDER SURGERY  1974    Family History  Problem Relation Age of Onset  . Arthritis Mother   . Diabetes Mother     . Heart disease Mother   . Heart attack Mother   . Pneumonia Father   . COPD Sister   . Heart disease Brother   . Pulmonary embolism Brother   . Alzheimer's disease Brother   . Alzheimer's disease Brother   . Alcoholism Unknown   . Arthritis Unknown   . Hypertension Unknown   . Sudden death Unknown   . Migraines Daughter   . Colon cancer Neg Hx     Social history:  reports that she has never smoked. She has never used smokeless tobacco. She reports that she drinks about 2.0 standard drinks of alcohol per week. She reports that she does not use drugs.  Medications:  Prior to Admission medications   Medication Sig Start Date End Date Taking? Authorizing Provider  ALPRAZolam Duanne Moron) 0.5 MG tablet Take 0.5 mg by mouth as needed for anxiety.    Yes Maisie Fus, MD  aspirin 81 MG tablet Take 81 mg by mouth daily.   Yes [provider]  Cholecalciferol (VITAMIN D PO) Take by mouth daily.   Yes [provider]  diclofenac sodium (VOLTAREN) 1 % GEL Apply 3 gm to 3 large joints up to 3 times a day.Dispense 3 tubes with 3 refills. 02/18/17  Yes Deveshwar, Abel Presto, MD  Multiple Vitamin (MULTIVITAMIN) tablet Take 1 tablet by mouth daily.   Yes [provider]  Omega-3 Fatty Acids (FISH OIL) 1200 MG CPDR Take by mouth daily.   Yes [provider]      Allergies  Allergen Reactions  . Codeine     Per patient made her "flip out"  . Penicillins Hives  . Sulfa Antibiotics Hives    ROS:  Out of a complete 14 system review of symptoms, the patient complains only of the following symptoms, and all other reviewed systems are negative.  Memory loss, concentration problems  Blood pressure 135/76, pulse 70, resp. rate 16, height 5\' 4"  (1.626 m), weight 143 lb (64.9 kg).  Physical Exam  General: The patient is alert and cooperative at the time of the examination.  Eyes: Pupils are equal, round, and reactive to light. Discs are flat bilaterally.  Neck:  The neck is supple, no carotid bruits are noted.  Respiratory: The respiratory examination is clear.  Cardiovascular: The cardiovascular examination reveals a regular rate and rhythm, no obvious murmurs or rubs are noted.  Skin: Extremities are without significant edema.  Neurologic Exam  Mental status: The patient is alert and oriented x 3 at the time of the examination. The patient has apparent normal recent and remote memory, with an apparently normal attention span and concentration ability.  Mini-Mental status examination done today shows a total score of 27/30.  The patient is able to name 15 four-legged animals in 1 minute.  Cranial nerves: Facial symmetry is present. There is good sensation of the face to pinprick and soft touch bilaterally. The strength of the facial muscles and the muscles to head turning and shoulder shrug are normal bilaterally. Speech is well enunciated, no aphasia or dysarthria is noted. Extraocular movements are full. Visual fields are full. The tongue is midline, and the patient has symmetric elevation of the soft palate. No obvious hearing deficits are noted.  Motor: The motor testing reveals 5 over 5 strength of all 4 extremities. Good symmetric motor tone is noted throughout.  Sensory: Sensory testing is intact to pinprick, soft touch, vibration sensation, and position sense on all 4 extremities. No evidence of extinction is noted.  Coordination: Cerebellar testing reveals good finger-nose-finger and heel-to-shin bilaterally.  Gait and station: Gait is normal. Tandem gait is normal. Romberg is negative. No drift is seen.  Reflexes: Deep tendon reflexes are symmetric and normal bilaterally. Toes are downgoing bilaterally.   Assessment/Plan:  1.  Mild memory disturbance  2.  History of ADD  The patient will be sent for further blood work today and she will have MRI of the brain.  The patient will be sent for neuropsychological evaluation.  The patient  has a very prominent family history of Alzheimer's disease, she may be at high risk because of this.  She will follow-up in 6 months.  Jill Alexanders MD 02/08/2018 12:36 PM  [facility] [address] [place], [place] [address]  Phone [phone] Fax [phone]

## 2018-02-08 NOTE — Telephone Encounter (Signed)
[employer] medicare order sent to [facility] they will obtain the auth and will reach out to the pt to schedule.

## 2018-02-09 LAB — RPR: RPR Ser Ql: NONREACTIVE

## 2018-02-09 LAB — VITAMIN B12: Vitamin B-12: 520 pg/mL (ref 232–1245)

## 2018-02-09 LAB — SEDIMENTATION RATE: SED RATE: 16 mm/h (ref 0–40)

## 2018-02-20 ENCOUNTER — Ambulatory Visit: Payer: Medicare HMO | Admitting: Rheumatology

## 2018-02-20 ENCOUNTER — Ambulatory Visit
Admission: RE | Admit: 2018-02-20 | Discharge: 2018-02-20 | Disposition: A | Discharge status: Home / Self Care | Payer: Medicare HMO | Source: Ambulatory Visit | Attending: Neurology | Admitting: Neurology

## 2018-02-20 DIAGNOSIS — R413 Other amnesia: Secondary | ICD-10-CM | POA: Diagnosis not present

## 2018-02-21 ENCOUNTER — Telehealth: Payer: Self-pay | Admitting: Neurology

## 2018-02-21 NOTE — Telephone Encounter (Signed)
I called the patient.  MRI of the brain is unremarkable exception of very minimal generalized cortical atrophy.  She will have formal neuropsychological testing in March, we will follow the memory issues over time.    MRI brain 02/20/18:  IMPRESSION: This MRI of the brain without contrast shows the following: 1.   Mild generalized cortical atrophy that has progressed compared to the 2015 CT scan. 2.   No evidence of chronic microvascular ischemic change  3.   There are no acute findings.

## 2018-03-13 ENCOUNTER — Ambulatory Visit (INDEPENDENT_AMBULATORY_CARE_PROVIDER_SITE_OTHER): Payer: Medicare HMO

## 2018-03-13 DIAGNOSIS — Z23 Encounter for immunization: Secondary | ICD-10-CM

## 2018-03-13 NOTE — Progress Notes (Signed)
Patient given influenza vaccine in left arm and tolerated well.

## 2018-03-14 DIAGNOSIS — N760 Acute vaginitis: Secondary | ICD-10-CM | POA: Diagnosis not present

## 2018-03-14 DIAGNOSIS — N8189 Other female genital prolapse: Secondary | ICD-10-CM | POA: Diagnosis not present

## 2018-04-11 DIAGNOSIS — R69 Illness, unspecified: Secondary | ICD-10-CM | POA: Diagnosis not present

## 2018-04-25 DIAGNOSIS — R69 Illness, unspecified: Secondary | ICD-10-CM | POA: Diagnosis not present

## 2018-05-18 DIAGNOSIS — L821 Other seborrheic keratosis: Secondary | ICD-10-CM | POA: Diagnosis not present

## 2018-05-18 DIAGNOSIS — L719 Rosacea, unspecified: Secondary | ICD-10-CM | POA: Diagnosis not present

## 2018-05-18 DIAGNOSIS — Z23 Encounter for immunization: Secondary | ICD-10-CM | POA: Diagnosis not present

## 2018-05-18 DIAGNOSIS — N760 Acute vaginitis: Secondary | ICD-10-CM | POA: Diagnosis not present

## 2018-05-18 DIAGNOSIS — R69 Illness, unspecified: Secondary | ICD-10-CM | POA: Diagnosis not present

## 2018-05-18 DIAGNOSIS — L723 Sebaceous cyst: Secondary | ICD-10-CM | POA: Diagnosis not present

## 2018-05-18 DIAGNOSIS — D225 Melanocytic nevi of trunk: Secondary | ICD-10-CM | POA: Diagnosis not present

## 2018-05-18 DIAGNOSIS — Z85828 Personal history of other malignant neoplasm of skin: Secondary | ICD-10-CM | POA: Diagnosis not present

## 2018-05-30 ENCOUNTER — Encounter: Payer: Medicare HMO | Admitting: Family Medicine

## 2018-06-08 ENCOUNTER — Ambulatory Visit: Payer: Self-pay | Admitting: *Deleted

## 2018-06-08 NOTE — Telephone Encounter (Signed)
Patient also advised the note was reviewed by Dr Maudie Mercury.

## 2018-06-08 NOTE — Telephone Encounter (Signed)
I called the pt and informed her we do not have any openings here in the office and Dr Jerilee Hoh reviewed the message below and advised that she go to the ER immediately.  Patient agreed.

## 2018-06-08 NOTE — Telephone Encounter (Signed)
Pt reports SOB, onset 3 days ago. States "A little worse today, like I can't fill up my lungs." Also reports chest tightness, intermittent but "Pretty much there all day today." Unsure if febrile, denies chills, no diaphoresis, no nausea. Denies CP. 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 Speech non-halting during call. Pt has iphone , number verified, email verified. Reviewed process for virtual appt if appropriate. Directed pt to ED if SOB, chest tightness worsen. Pt verbalizes understanding.  Please advise: [phone]  Reason for Disposition . MILD difficulty breathing (e.g., minimal/no SOB at rest, SOB with walking, pulse <100)  Answer Assessment - Initial Assessment Questions 1. COVID-19 DIAGNOSIS: "Who made your Coronavirus (COVID-19) diagnosis?" "Was it confirmed by a positive lab test?" If not diagnosed by a HCP, ask "Are there lots of cases (community spread) where you live?" (See public health department website, if unsure)   * MAJOR community spread: high number of cases; numbers of cases are increasing; many people hospitalized.   * MINOR community spread: low number of cases; not increasing; few or no people hospitalized     N/A 2. ONSET: "When did the COVID-19 symptoms start?"      SOB 2-3 days ago 3. WORST SYMPTOM: "What is your worst symptom?" (e.g., cough, fever, shortness of breath, muscle aches)     Chest tightness, intermittent, no pain 4. COUGH: "How bad is the cough?"      no 5. FEVER: "Do you have a fever?" If so, ask: "What is your temperature, how was it measured, and when did it start?"    Not sure 6. RESPIRATORY STATUS: "Describe your breathing?" (e.g., shortness of breath, wheezing, unable to speak)    SOB, "Have to yawn to get a deep breath." 7. BETTER-SAME-WORSE: "Are you getting better, staying the same or getting worse compared to yesterday?"  If getting worse, ask, "In what  way?"     "Little worse" more constant 8. HIGH RISK DISEASE: "Do you have any chronic medical problems?" (e.g., asthma, heart or lung disease, weak immune system, etc.)     no  10. OTHER SYMPTOMS: "Do you have any other symptoms?"  (e.g., runny nose, headache, sore throat, loss of smell)      no  Protocols used: CORONAVIRUS (COVID-19) DIAGNOSED OR SUSPECTED-A-AH

## 2018-06-08 NOTE — Telephone Encounter (Signed)
Needs webex asap - please place on another provider's schedule if available or would need to go to [facility] or ER as I am with pts and booked solid. I believe Dr. Volanda Napoleon has openings? But if note needs to be evaluated today at [facility] or ER.

## 2018-06-09 ENCOUNTER — Other Ambulatory Visit: Payer: Self-pay

## 2018-06-09 ENCOUNTER — Ambulatory Visit (INDEPENDENT_AMBULATORY_CARE_PROVIDER_SITE_OTHER): Payer: Medicare HMO | Admitting: Family Medicine

## 2018-06-09 DIAGNOSIS — R0989 Other specified symptoms and signs involving the circulatory and respiratory systems: Secondary | ICD-10-CM | POA: Diagnosis not present

## 2018-06-09 NOTE — Progress Notes (Signed)
Patient ID: Tara Martin, female   DOB: 05-08-1940, 78 y.o.   MRN: [id number]  Virtual Visit via Video Note  I connected with Tara Martin on 06/09/18 at  2:00 PM EDT by a video enabled telemedicine application and verified that I am speaking with the correct person using two identifiers.  Location patient: home Location provider:work or home office Persons participating in the virtual visit: patient, provider  I discussed the limitations of evaluation and management by telemedicine and the availability of in person appointments. The patient expressed understanding and agreed to proceed.   HPI: Patient had called yesterday with complaints of some shortness of breath.  She did not have any cough or fever.  She was initially directed to go to the emergency department by triage but we ended up talking to her on the phone.  After further questions.  It was clear that patient was having what sounded more like anxiety related shortness of breath with some air hunger.  She states that only when she yawned she could get a full deep breath.  She had no dyspnea whatsoever with activity and no chest pain.  No pleuritic pain.  In fact, she states that her symptoms were consistently improved when she was out walking her dog with her husband.  She has not had any recent lower extremity swelling or pain.  No recent travels.  No sick contacts.  She has had similar anxiety symptoms in the past.  She takes Xanax infrequently that also seems to relieve her symptoms.  She has been watching the news much recently and we had advised to turn off the news for a while and and stay engaged in hobbies and she feels somewhat better today.   ROS: See pertinent positives and negatives per HPI.  Past Medical History:  Diagnosis Date  . Anxiety state, unspecified - sees Dr. Nori Riis in gyn and benzo prescribed there 04/09/2013  . Arthritis   . Chicken pox   . Lupus ([place])   . Migraines   . Prediabetes     Past Surgical  History:  Procedure Laterality Date  . BACK SURGERY  2000  . CHOLECYSTECTOMY    . DILATION AND CURETTAGE OF UTERUS  1973  . GALLBLADDER SURGERY  1974    Family History  Problem Relation Age of Onset  . Arthritis Mother   . Diabetes Mother   . Heart disease Mother   . Heart attack Mother   . Pneumonia Father   . COPD Sister   . Heart disease Brother   . Pulmonary embolism Brother   . Alzheimer's disease Brother   . Alzheimer's disease Brother   . Alcoholism Unknown   . Arthritis Unknown   . Hypertension Unknown   . Sudden death Unknown   . Migraines Daughter   . Colon cancer Neg Hx     SOCIAL HX: No history of smoking   Current Outpatient Medications:  .  ALPRAZolam (XANAX) 0.5 MG tablet, Take 0.5 mg by mouth as needed for anxiety. , Disp: , Rfl:  .  aspirin 81 MG tablet, Take 81 mg by mouth daily., Disp: , Rfl:  .  Cholecalciferol (VITAMIN D PO), Take by mouth daily., Disp: , Rfl:  .  diclofenac sodium (VOLTAREN) 1 % GEL, Apply 3 gm to 3 large joints up to 3 times a day.Dispense 3 tubes with 3 refills., Disp: 3 Tube, Rfl: 1 .  Multiple Vitamin (MULTIVITAMIN) tablet, Take 1 tablet by mouth daily., Disp: , Rfl:  .  Omega-3 Fatty Acids (FISH OIL) 1200 MG CPDR, Take by mouth daily., Disp: , Rfl:   EXAM:  VITALS per patient if applicable:  GENERAL: alert, oriented, appears well and in no acute distress  HEENT: atraumatic, conjunttiva clear, no obvious abnormalities on inspection of external nose and ears  NECK: normal movements of the head and neck  LUNGS: on inspection no signs of respiratory distress, breathing rate appears normal, no obvious gross SOB, gasping or wheezing  CV: no obvious cyanosis  MS: moves all visible extremities without noticeable abnormality  PSYCH/NEURO: pleasant and cooperative, no obvious depression or anxiety, speech and thought processing grossly intact  ASSESSMENT AND PLAN:  Discussed the following assessment and plan:  Air  hunger/anxiety related.  Patient does not have any worrisome features such as fever, cough, chest pain, pleuritic pain, or any exertional symptoms.  Her symptoms resolved with activity such as walking outdoors  -Discussed stress and anxiety management -We recommend they limit news and TV time currently and stay engaged in hobbies and other activities -Follow-up immediately for any fever, cough, chest pain, or other concerns or other change of symptoms    I discussed the assessment and treatment plan with the patient. The patient was provided an opportunity to ask questions and all were answered. The patient agreed with the plan and demonstrated an understanding of the instructions.   The patient was advised to call back or seek an in-person evaluation if the symptoms worsen or if the condition fails to improve as anticipated.    Carolann Littler, MD

## 2018-07-21 DIAGNOSIS — Z4689 Encounter for fitting and adjustment of other specified devices: Secondary | ICD-10-CM | POA: Diagnosis not present

## 2018-07-21 DIAGNOSIS — Z01419 Encounter for gynecological examination (general) (routine) without abnormal findings: Secondary | ICD-10-CM | POA: Diagnosis not present

## 2018-07-21 DIAGNOSIS — N819 Female genital prolapse, unspecified: Secondary | ICD-10-CM | POA: Diagnosis not present

## 2018-07-21 DIAGNOSIS — Z6825 Body mass index (BMI) 25.0-25.9, adult: Secondary | ICD-10-CM | POA: Diagnosis not present

## 2018-08-29 DIAGNOSIS — Z1231 Encounter for screening mammogram for malignant neoplasm of breast: Secondary | ICD-10-CM | POA: Diagnosis not present

## 2018-09-06 ENCOUNTER — Ambulatory Visit: Payer: Medicare HMO | Admitting: Neurology

## 2018-10-18 ENCOUNTER — Ambulatory Visit (INDEPENDENT_AMBULATORY_CARE_PROVIDER_SITE_OTHER): Payer: Medicare HMO | Admitting: Family Medicine

## 2018-10-18 ENCOUNTER — Encounter: Payer: Self-pay | Admitting: Family Medicine

## 2018-10-18 ENCOUNTER — Other Ambulatory Visit: Payer: Self-pay

## 2018-10-18 VITALS — BP 118/78 | HR 80 | Temp 98.4°F | Ht 63.0 in | Wt 146.7 lb

## 2018-10-18 DIAGNOSIS — M79604 Pain in right leg: Secondary | ICD-10-CM | POA: Diagnosis not present

## 2018-10-18 DIAGNOSIS — R7303 Prediabetes: Secondary | ICD-10-CM | POA: Diagnosis not present

## 2018-10-18 DIAGNOSIS — R5383 Other fatigue: Secondary | ICD-10-CM

## 2018-10-18 NOTE — Progress Notes (Signed)
Subjective:     Patient ID: Tara Martin, female   DOB: 08-23-1940, 78 y.o.   MRN: [id number]  HPI Patient is seen today to transition care.  She is new to me as a provider but not new to this practice.  I see her husband regularly.  She has history of prediabetes and reported history of lupus although apparently diagnosis was in dispute.  She went to a rheumatologist in [place] and they state that they did not think she ever had lupus.  Not clear how her diagnosis was made.  She has battled some anxiety symptoms in the past but currently stable.  She states she is had some intermittent anterior leg pain right greater than left for past 2 years.  Not limiting walking.  Distribution is anterior tibia involving about the middle third.  No visible swelling.  No specific injury.  She apparently seen a couple specialist in the past.  She thinks she had x-rays and thinks these were unremarkable.  No claudication symptoms.  She has had some nonspecific fatigue recently.  Sometimes has poor sleep.  History of prediabetes.  Not monitoring blood sugars.  No polyuria or polydipsia.  Appetite and weight stable.  Last A1c was below 6  Past Medical History:  Diagnosis Date  . Anxiety state, unspecified - sees Dr. Nori Riis in gyn and benzo prescribed there 04/09/2013  . Arthritis   . Chicken pox   . Lupus ([place])   . Migraines   . Prediabetes    Past Surgical History:  Procedure Laterality Date  . BACK SURGERY  2000  . CHOLECYSTECTOMY    . DILATION AND CURETTAGE OF UTERUS  1973  . [place]    reports that she has never smoked. She has never used smokeless tobacco. She reports current alcohol use of about 2.0 standard drinks of alcohol per week. She reports that she does not use drugs. family history includes Alcoholism in an other family member; Alzheimer's disease in her brother and brother; Arthritis in her mother and another family member; COPD in her sister; Diabetes in her mother;  Heart attack in her mother; Heart disease in her brother and mother; Hypertension in an other family member; Migraines in her daughter; Pneumonia in her father; Pulmonary embolism in her brother; Sudden death in an other family member. Allergies  Allergen Reactions  . Codeine     Per patient made her "flip out"  . Penicillins Hives  . Sulfa Antibiotics Hives     Review of Systems  Constitutional: Positive for fever. Negative for appetite change, chills, fatigue and unexpected weight change.  Eyes: Negative for visual disturbance.  Respiratory: Negative for cough, chest tightness, shortness of breath and wheezing.   Cardiovascular: Negative for chest pain, palpitations and leg swelling.  Gastrointestinal: Negative for abdominal pain.  Endocrine: Negative for polydipsia and polyuria.  Genitourinary: Negative for dysuria.  Neurological: Negative for dizziness, seizures, syncope, weakness, light-headedness and headaches.       Objective:   Physical Exam Constitutional:      Appearance: Normal appearance.  Cardiovascular:     Rate and Rhythm: Normal rate and regular rhythm.  Pulmonary:     Effort: Pulmonary effort is normal.     Breath sounds: Normal breath sounds.  Musculoskeletal:     Right lower leg: No edema.     Left lower leg: No edema.     Comments: She has some scattered varicosities.  No focal tenderness involving her tibia.  Lymphadenopathy:  Cervical: No cervical adenopathy.  Neurological:     Mental Status: She is alert.     Comments: 2+ reflexes knee and ankle bilaterally.  Full strength lower extremities throughout        Assessment:     #1 bilateral leg pain right greater than left anterior tibia.  Question mild shin splints.  She has not had any limitation with walking.  Doubt stress fracture.  No reproducible tenderness  #2 history of prediabetes  #3 some recent mild fatigue.    Plan:     -Check further labs with A1c, TSH, CBC, chemistries -For  any progressive leg pain consider trial of diclofenac gel.  If leg pain worsening consider plain films  Eulas Post MD [facility]

## 2018-10-19 LAB — BASIC METABOLIC PANEL
BUN: 14 mg/dL (ref 6–23)
CO2: 25 mEq/L (ref 19–32)
Calcium: 9.4 mg/dL (ref 8.4–10.5)
Chloride: 103 mEq/L (ref 96–112)
Creatinine, Ser: 0.92 mg/dL (ref 0.40–1.20)
GFR: 59.21 mL/min — ABNORMAL LOW (ref >60.00)
Glucose, Bld: 90 mg/dL (ref 70–99)
Potassium: 4.9 mEq/L (ref 3.5–5.1)
Sodium: 135 mEq/L (ref 135–145)

## 2018-10-19 LAB — CBC WITH DIFFERENTIAL/PLATELET
Basophils Absolute: 0.1 10*3/uL (ref 0.0–0.1)
Basophils Relative: 1.3 % (ref 0.0–3.0)
Eosinophils Absolute: 0.1 10*3/uL (ref 0.0–0.7)
Eosinophils Relative: 1.7 % (ref 0.0–5.0)
HCT: 38.6 % (ref 36.0–46.0)
Hemoglobin: 12.8 g/dL (ref 12.0–15.0)
Lymphocytes Relative: 26.4 % (ref 12.0–46.0)
Lymphs Abs: 1.1 10*3/uL (ref 0.7–4.0)
MCHC: 33.3 g/dL (ref 30.0–36.0)
MCV: 92.2 fl (ref 78.0–100.0)
Monocytes Absolute: 0.5 10*3/uL (ref 0.1–1.0)
Monocytes Relative: 11.8 % (ref 3.0–12.0)
Neutro Abs: 2.3 10*3/uL (ref 1.4–7.7)
Neutrophils Relative %: 58.8 % (ref 43.0–77.0)
Platelets: 192 10*3/uL (ref 150.0–400.0)
RBC: 4.18 Mil/uL (ref 3.87–5.11)
RDW: 13.5 % (ref 11.5–15.5)
WBC: 4 10*3/uL (ref 4.0–10.5)

## 2018-10-19 LAB — TSH: TSH: 3.92 u[IU]/mL (ref 0.35–4.50)

## 2018-10-19 LAB — HEMOGLOBIN A1C: Hgb A1c MFr Bld: 6.2 % (ref 4.6–6.5)

## 2018-11-14 DIAGNOSIS — N8182 Incompetence or weakening of pubocervical tissue: Secondary | ICD-10-CM | POA: Diagnosis not present

## 2018-11-14 DIAGNOSIS — Z4689 Encounter for fitting and adjustment of other specified devices: Secondary | ICD-10-CM | POA: Diagnosis not present

## 2018-11-20 DIAGNOSIS — R69 Illness, unspecified: Secondary | ICD-10-CM | POA: Diagnosis not present

## 2018-11-27 ENCOUNTER — Ambulatory Visit (INDEPENDENT_AMBULATORY_CARE_PROVIDER_SITE_OTHER): Payer: Medicare HMO

## 2018-11-27 ENCOUNTER — Other Ambulatory Visit: Payer: Self-pay

## 2018-11-27 DIAGNOSIS — Z23 Encounter for immunization: Secondary | ICD-10-CM

## 2018-11-27 NOTE — Progress Notes (Signed)
Per orders of Dr. Elease Hashimoto, injection of High Dose Fluad Quadrivalent given by Wyvonne Lenz. Patient tolerated injection well.

## 2018-12-11 ENCOUNTER — Other Ambulatory Visit: Payer: Self-pay

## 2018-12-11 ENCOUNTER — Telehealth (INDEPENDENT_AMBULATORY_CARE_PROVIDER_SITE_OTHER): Payer: Medicare HMO | Admitting: Family Medicine

## 2018-12-11 DIAGNOSIS — Z20828 Contact with and (suspected) exposure to other viral communicable diseases: Secondary | ICD-10-CM

## 2018-12-11 DIAGNOSIS — Z20822 Contact with and (suspected) exposure to covid-19: Secondary | ICD-10-CM

## 2018-12-11 NOTE — Progress Notes (Signed)
This visit type was conducted due to national recommendations for restrictions regarding the COVID-19 pandemic in an effort to limit this patient's exposure and mitigate transmission in our community.   Virtual Visit via Telephone Note  I connected with Tara Martin on 12/11/18 at  2:15 PM EDT by telephone and verified that I am speaking with the correct person using two identifiers.   I discussed the limitations, risks, security and privacy concerns of performing an evaluation and management service by telephone and the availability of in person appointments. I also discussed with the patient that there may be a patient responsible charge related to this service. The patient expressed understanding and agreed to proceed.  Location patient: home Location provider: work or home office Participants present for the call: patient, provider Patient did not have a visit in the prior 7 days to address this/these issue(s).   History of Present Illness: Patient had some questions regarding whether she should proceed with COVID testing.  She states that last Tuesday she was around a friend who for the most part had mask use but for probably 15 minutes they were about 5 to 6 feet apart without mask.  Her friend went to the beach and last Thursday started having some symptoms of body aches and low-grade fever.  He came back and went to a clinic today and had a rapid test that came back positive.  Robby's exposure would have been 6 days ago.  She has no symptoms whatsoever.  No fever, chills, body aches, nasal congestion, cough, dyspnea, diarrhea, or any loss of taste or smell.   Observations/Objective: Patient sounds cheerful and well on the phone. I do not appreciate any SOB. Speech and thought processing are grossly intact. Patient reported vitals:  Assessment and Plan:  Possible COVID exposure.  Patient totally asymptomatic.  Exposure would have been 6 days ago  -She is advised to stay  quarantined at this time. -She will consider going tomorrow to get tested for COVID. -Follow-up immediately for any fever, cough, shortness of breath, etc.  We reviewed signs and symptoms of COVID infection  Follow Up Instructions:  -As above   [id number] [id number] [id number] 11-20 [id number] 21-30 I did not refer this patient for an OV in the next 24 hours for this/these issue(s).  I discussed the assessment and treatment plan with the patient. The patient was provided an opportunity to ask questions and all were answered. The patient agreed with the plan and demonstrated an understanding of the instructions.   The patient was advised to call back or seek an in-person evaluation if the symptoms worsen or if the condition fails to improve as anticipated.  I provided 18 minutes of non-face-to-face time during this encounter.   Carolann Littler, MD

## 2019-02-06 DIAGNOSIS — M5441 Lumbago with sciatica, right side: Secondary | ICD-10-CM | POA: Diagnosis not present

## 2019-02-06 DIAGNOSIS — M5442 Lumbago with sciatica, left side: Secondary | ICD-10-CM | POA: Diagnosis not present

## 2019-02-26 DIAGNOSIS — Z03818 Encounter for observation for suspected exposure to other biological agents ruled out: Secondary | ICD-10-CM | POA: Diagnosis not present

## 2019-03-14 DIAGNOSIS — M5442 Lumbago with sciatica, left side: Secondary | ICD-10-CM | POA: Diagnosis not present

## 2019-03-14 DIAGNOSIS — M5441 Lumbago with sciatica, right side: Secondary | ICD-10-CM | POA: Diagnosis not present

## 2019-03-14 DIAGNOSIS — M5416 Radiculopathy, lumbar region: Secondary | ICD-10-CM | POA: Diagnosis not present

## 2019-04-18 DIAGNOSIS — R350 Frequency of micturition: Secondary | ICD-10-CM | POA: Diagnosis not present

## 2019-04-18 DIAGNOSIS — N390 Urinary tract infection, site not specified: Secondary | ICD-10-CM | POA: Diagnosis not present

## 2019-04-18 DIAGNOSIS — Z4689 Encounter for fitting and adjustment of other specified devices: Secondary | ICD-10-CM | POA: Diagnosis not present

## 2019-05-03 DIAGNOSIS — L720 Epidermal cyst: Secondary | ICD-10-CM | POA: Diagnosis not present

## 2019-05-03 DIAGNOSIS — L821 Other seborrheic keratosis: Secondary | ICD-10-CM | POA: Diagnosis not present

## 2019-05-03 DIAGNOSIS — D485 Neoplasm of uncertain behavior of skin: Secondary | ICD-10-CM | POA: Diagnosis not present

## 2019-05-03 DIAGNOSIS — D0439 Carcinoma in situ of skin of other parts of face: Secondary | ICD-10-CM | POA: Diagnosis not present

## 2019-05-06 ENCOUNTER — Ambulatory Visit: Payer: Medicare HMO | Attending: Internal Medicine

## 2019-05-06 DIAGNOSIS — Z23 Encounter for immunization: Secondary | ICD-10-CM

## 2019-05-06 NOTE — Progress Notes (Signed)
   Covid-19 Vaccination Clinic  Name:  RATEEL PLINE    MRN: [id number] DOB: 1941-02-16  05/06/2019  Ms. Sholl was observed post Covid-19 immunization for 15 minutes without incidence. She was provided with Vaccine Information Sheet and instruction to access the V-Safe system.   Ms. Vile was instructed to call 911 with any severe reactions post vaccine: Marland Kitchen Difficulty breathing  . Swelling of your face and throat  . A fast heartbeat  . A bad rash all over your body  . Dizziness and weakness    Immunizations Administered    Name Date Dose VIS Date Route   Pfizer COVID-19 Vaccine 05/06/2019  9:07 AM 0.3 mL 02/16/2019 Intramuscular   Manufacturer: [place]   Lot: [id number]   [place]: [id number]

## 2019-05-23 DIAGNOSIS — R69 Illness, unspecified: Secondary | ICD-10-CM | POA: Diagnosis not present

## 2019-05-30 ENCOUNTER — Ambulatory Visit: Payer: Medicare HMO | Attending: Internal Medicine

## 2019-05-30 DIAGNOSIS — Z23 Encounter for immunization: Secondary | ICD-10-CM

## 2019-05-30 NOTE — Progress Notes (Signed)
   Covid-19 Vaccination Clinic  Name:  Tara Martin    MRN: [id number] DOB: 1940/05/28  05/30/2019  Tara Martin was observed post Covid-19 immunization for 15 minutes without incident. She was provided with Vaccine Information Sheet and instruction to access the V-Safe system.   Tara Martin was instructed to call 911 with any severe reactions post vaccine: Marland Kitchen Difficulty breathing  . Swelling of face and throat  . A fast heartbeat  . A bad rash all over body  . Dizziness and weakness   Immunizations Administered    Name Date Dose VIS Date Route   Pfizer COVID-19 Vaccine 05/30/2019 11:22 AM 0.3 mL 02/16/2019 Intramuscular   Manufacturer: [place]   Lot: [id number]   [place]: [id number]

## 2019-05-31 DIAGNOSIS — R69 Illness, unspecified: Secondary | ICD-10-CM | POA: Diagnosis not present

## 2019-08-01 ENCOUNTER — Ambulatory Visit: Payer: Medicare HMO | Admitting: Family Medicine

## 2019-08-01 DIAGNOSIS — Z0289 Encounter for other administrative examinations: Secondary | ICD-10-CM

## 2019-08-21 ENCOUNTER — Encounter: Payer: Self-pay | Admitting: Family Medicine

## 2019-08-21 ENCOUNTER — Ambulatory Visit (INDEPENDENT_AMBULATORY_CARE_PROVIDER_SITE_OTHER): Payer: Medicare HMO | Admitting: Family Medicine

## 2019-08-21 ENCOUNTER — Other Ambulatory Visit: Payer: Self-pay

## 2019-08-21 VITALS — BP 122/80 | HR 75 | Temp 97.6°F | Wt 143.7 lb

## 2019-08-21 DIAGNOSIS — G3184 Mild cognitive impairment, so stated: Secondary | ICD-10-CM

## 2019-08-21 DIAGNOSIS — R0789 Other chest pain: Secondary | ICD-10-CM

## 2019-08-21 MED ORDER — DONEPEZIL HCL 5 MG PO TABS: 5.0000 mg | ORAL_TABLET | Freq: Every day | ORAL | 1 refills | Status: DC

## 2019-08-21 NOTE — Patient Instructions (Signed)
Follow up for any exertional chest pressure or increased shortness of breath.   Set up one month follow up.

## 2019-08-21 NOTE — Progress Notes (Signed)
Established Patient Office Visit  Subjective:  Patient ID: Tara Martin, female    DOB: 27-Jun-1940  Age: 79 y.o. MRN: [id number]  CC:  Chief Complaint  Patient presents with  . Chest Pain    Pt states she has chest pressure that comes and goes for about a month now     HPI Tara Martin presents for 3-week history of intermittent and very transient atypical chest symptoms.  She has history of prediabetes range blood sugars.  Currently takes no regular medications.  She states about 3 weeks ago she started having some very localized left substernal pressure symptoms lasting about 10 seconds and she states this is an area about the size of a nickel.  No associated dyspnea.  No nausea or vomiting.  No diaphoresis.  No radiation to the neck or arm.  No cough.  Appetite and weight stable.  No palpitations.  She estimates she has had 10-15 episodes over the past few weeks with most recent earlier today.  She has never had any exertional type symptoms.  Symptoms are never lasted more than about 10 seconds.  No reflux symptoms.  She had a brother with vague history of possible coronary disease in his 47s.  [name] is a non-smoker.  No hypertension history.  Last lipids were over 2 years ago with cholesterol 187, triglycerides 124, HDL 34, LDL 128  She also expresses concerns about very strong family history of Alzheimer's dementia.  There is some question of whether her mom may have had Alzheimer's.  She states she had 2 brothers and 1 sister with Alzheimer's.  Patient actually saw neurologist 2019.  Lab work unremarkable.  MRI from 02/20/2018 showed mild generalized cortical atrophy progressed compared to 2015 scan.  No evidence for chronic microvascular ischemic changes.  She states her husband has particularly reminded her constantly of her short-term memory issues.  Past Medical History:  Diagnosis Date  . Anxiety state, unspecified - sees Dr. Nori Riis in gyn and benzo prescribed there  04/09/2013  . Arthritis   . Chicken pox   . Lupus ([place])   . Migraines   . Prediabetes     Past Surgical History:  Procedure Laterality Date  . BACK SURGERY  2000  . CHOLECYSTECTOMY    . DILATION AND CURETTAGE OF UTERUS  1973  . GALLBLADDER SURGERY  1974    Family History  Problem Relation Age of Onset  . Arthritis Mother   . Diabetes Mother   . Heart disease Mother   . Heart attack Mother   . Pneumonia Father   . COPD Sister   . Heart disease Brother   . Pulmonary embolism Brother   . Alzheimer's disease Brother   . Alzheimer's disease Brother   . Alcoholism Other   . Arthritis Other   . Hypertension Other   . Sudden death Other   . Migraines Daughter   . Colon cancer Neg Hx     Social History   Socioeconomic History  . Marital status: Married    Spouse name: Not on file  . Number of children: Not on file  . Years of education: Not on file  . Highest education level: Not on file  Occupational History  . Not on file  Tobacco Use  . Smoking status: Never Smoker  . Smokeless tobacco: Never Used  Vaping Use  . Vaping Use: Never used  Substance and Sexual Activity  . Alcohol use: Yes    Alcohol/week: 2.0 standard drinks  Types: 2 Glasses of wine per week  . Drug use: No  . Sexual activity: Not on file  Other Topics Concern  . Not on file  Social History Narrative   Work or School: Product manager Situation: lives with husband, husband sees Dr. Elease Hashimoto      Spiritual Beliefs:       Lifestyle: no regular exercise            Social Determinants of Health   Financial Resource Strain:   . Difficulty of Paying Living Expenses:   Food Insecurity:   . Worried About Charity fundraiser in the Last Year:   . Arboriculturist in the Last Year:   Transportation Needs:   . Film/video editor (Medical):   Marland Kitchen Lack of Transportation (Non-Medical):   Physical Activity:   . Days of Exercise per Week:   . Minutes of Exercise per Session:   Stress:   .  Feeling of Stress :   Social Connections:   . Frequency of Communication with Friends and Family:   . Frequency of Social Gatherings with Friends and Family:   . Attends Religious Services:   . Active Member of Clubs or Organizations:   . Attends Archivist Meetings:   Marland Kitchen Marital Status:   Intimate Partner Violence:   . Fear of Current or Ex-Partner:   . Emotionally Abused:   Marland Kitchen Physically Abused:   . Sexually Abused:     Outpatient Medications Prior to Visit  Medication Sig Dispense Refill  . ALPRAZolam (XANAX) 0.5 MG tablet Take 0.5 mg by mouth as needed for anxiety.     Marland Kitchen amphetamine-dextroamphetamine (ADDERALL XR) 20 MG 24 hr capsule dextroamphetamine-amphetamine ER 20 mg 24hr capsule,extend release  TAKE 2 CAPSULES BY MOUTH EVERY DAY    . aspirin 81 MG tablet Take 81 mg by mouth daily.    . Cholecalciferol (VITAMIN D PO) Take by mouth daily.    . diclofenac sodium (VOLTAREN) 1 % GEL Apply 3 gm to 3 large joints up to 3 times a day.Dispense 3 tubes with 3 refills. 3 Tube 1  . doxycycline (VIBRAMYCIN) 100 MG capsule Take 100 mg by mouth 2 (two) times daily.    . Multiple Vitamin (MULTIVITAMIN) tablet Take 1 tablet by mouth daily.    . Omega-3 Fatty Acids (FISH OIL) 1200 MG CPDR Take by mouth daily.     No facility-administered medications prior to visit.    Allergies  Allergen Reactions  . Codeine     Per patient made her "flip out"  . Penicillins Hives  . Sulfa Antibiotics Hives    ROS Review of Systems  Constitutional: Negative for appetite change, chills, fever and unexpected weight change.  Respiratory: Negative for cough and shortness of breath.   Cardiovascular: Negative for palpitations and leg swelling.  Gastrointestinal: Negative for abdominal pain, nausea and vomiting.      Objective:    Physical Exam Vitals reviewed.  Constitutional:      Appearance: She is well-developed.  Neck:     Thyroid: No thyromegaly.  Cardiovascular:     Rate and  Rhythm: Normal rate and regular rhythm.     Heart sounds: Normal heart sounds.  Pulmonary:     Effort: Pulmonary effort is normal.     Breath sounds: Normal breath sounds. No decreased breath sounds.  Chest:     Chest wall: No tenderness.  Musculoskeletal:     Cervical back:  Neck supple.     Right lower leg: No edema.     Left lower leg: No edema.  Neurological:     General: No focal deficit present.     Mental Status: She is alert.     Cranial Nerves: No cranial nerve deficit.     Comments: Patient able to name 12 four-legged animals in 1 minute compared to 15 back in 2019  She was oriented to season, day, month but not date or year  3 out of 3 word recall Normal serial 7 subtractions     BP 122/80 (BP Location: Left Arm, Patient Position: Sitting, Cuff Size: Normal)   Pulse 75   Temp 97.6 F (36.4 C) (Temporal)   Wt 143 lb 11.2 oz (65.2 kg)   SpO2 97%   BMI 25.46 kg/m  Wt Readings from Last 3 Encounters:  08/21/19 143 lb 11.2 oz (65.2 kg)  10/18/18 146 lb 11.2 oz (66.5 kg)  02/08/18 143 lb (64.9 kg)     Health Maintenance Due  Topic Date Due  . Hepatitis C Screening  Never done  . MAMMOGRAM  09/06/2015    There are no preventive care reminders to display for this patient.  Lab Results  Component Value Date   TSH 3.92 10/18/2018   Lab Results  Component Value Date   WBC 4.0 10/18/2018   HGB 12.8 10/18/2018   HCT 38.6 10/18/2018   MCV 92.2 10/18/2018   PLT 192.0 10/18/2018   Lab Results  Component Value Date   NA 135 10/18/2018   K 4.9 10/18/2018   CO2 25 10/18/2018   GLUCOSE 90 10/18/2018   BUN 14 10/18/2018   CREATININE 0.92 10/18/2018   BILITOT 0.3 02/18/2017   ALKPHOS 74 11/05/2014   AST 24 02/18/2017   ALT 20 02/18/2017   PROT 6.8 02/18/2017   ALBUMIN 4.2 11/05/2014   CALCIUM 9.4 10/18/2018   ANIONGAP 14 02/12/2014   GFR 59.21 (L) 10/18/2018   Lab Results  Component Value Date   CHOL 187 01/25/2017   Lab Results  Component Value  Date   HDL 34.90 (L) 01/25/2017   Lab Results  Component Value Date   LDLCALC 128 (H) 01/25/2017   Lab Results  Component Value Date   TRIG 124.0 01/25/2017   Lab Results  Component Value Date   CHOLHDL 5 01/25/2017   Lab Results  Component Value Date   HGBA1C 6.2 10/18/2018      Assessment & Plan:  #1 atypical chest symptoms.  Patient presents with 3-week history of very atypical chest symptoms with extremely localized transient lasting about 10 seconds chest pressure which is not exertional.  EKG shows sinus rhythm with no acute ST-T changes.  Likelihood of this being cardiac seems slim given brief duration of seconds.  -Instructed to follow-up promptly for any dyspnea, more prolonged symptoms, or exertional symptoms  #2 mild cognitive impairment.  Strong family history of Alzheimer's dementia.  -We discussed consideration for starting Aricept 5 mg daily.  Reviewed potential side effects. -Set up 1 month follow-up.  If tolerating well at that time consider titration to 10 mg.  Recommend full MMSE at follow-up  No orders of the defined types were placed in this encounter.   Follow-up: No follow-ups on file.    Carolann Littler, MD

## 2019-08-28 ENCOUNTER — Encounter: Payer: Medicare HMO | Admitting: Family Medicine

## 2019-08-28 DIAGNOSIS — N644 Mastodynia: Secondary | ICD-10-CM | POA: Diagnosis not present

## 2019-08-28 DIAGNOSIS — Z4689 Encounter for fitting and adjustment of other specified devices: Secondary | ICD-10-CM | POA: Diagnosis not present

## 2019-08-29 ENCOUNTER — Other Ambulatory Visit: Payer: Self-pay | Admitting: Obstetrics & Gynecology

## 2019-08-29 DIAGNOSIS — N644 Mastodynia: Secondary | ICD-10-CM

## 2019-09-13 ENCOUNTER — Ambulatory Visit
Admission: RE | Admit: 2019-09-13 | Discharge: 2019-09-13 | Disposition: A | Discharge status: Home / Self Care | Payer: Medicare HMO | Source: Ambulatory Visit | Attending: Obstetrics & Gynecology | Admitting: Obstetrics & Gynecology

## 2019-09-13 ENCOUNTER — Other Ambulatory Visit: Payer: Self-pay

## 2019-09-13 DIAGNOSIS — N644 Mastodynia: Secondary | ICD-10-CM

## 2019-09-13 DIAGNOSIS — R922 Inconclusive mammogram: Secondary | ICD-10-CM | POA: Diagnosis not present

## 2019-09-13 DIAGNOSIS — N6489 Other specified disorders of breast: Secondary | ICD-10-CM | POA: Diagnosis not present

## 2019-09-21 ENCOUNTER — Encounter: Payer: Medicare HMO | Admitting: Family Medicine

## 2019-10-04 ENCOUNTER — Telehealth: Payer: Self-pay | Admitting: Family Medicine

## 2019-10-04 NOTE — Telephone Encounter (Signed)
Spoke with patient she stated to call her back the next day.

## 2019-10-15 ENCOUNTER — Telehealth: Payer: Self-pay | Admitting: Family Medicine

## 2019-10-15 NOTE — Telephone Encounter (Signed)
Left message for patient to schedule Annual Wellness Visit.  Please schedule with Nurse Health Advisor Shannon Crews, RN at [facility]  

## 2019-10-30 ENCOUNTER — Other Ambulatory Visit: Payer: Self-pay | Admitting: Family Medicine

## 2019-12-03 DIAGNOSIS — Z6825 Body mass index (BMI) 25.0-25.9, adult: Secondary | ICD-10-CM | POA: Diagnosis not present

## 2019-12-03 DIAGNOSIS — Z01419 Encounter for gynecological examination (general) (routine) without abnormal findings: Secondary | ICD-10-CM | POA: Diagnosis not present

## 2019-12-11 ENCOUNTER — Encounter: Payer: Self-pay | Admitting: Family Medicine

## 2019-12-11 ENCOUNTER — Other Ambulatory Visit: Payer: Self-pay

## 2019-12-11 ENCOUNTER — Ambulatory Visit (INDEPENDENT_AMBULATORY_CARE_PROVIDER_SITE_OTHER): Payer: Medicare HMO | Admitting: Family Medicine

## 2019-12-11 VITALS — BP 122/64 | HR 68 | Temp 98.6°F | Ht 63.0 in | Wt 140.0 lb

## 2019-12-11 DIAGNOSIS — Z0000 Encounter for general adult medical examination without abnormal findings: Secondary | ICD-10-CM

## 2019-12-11 DIAGNOSIS — Z131 Encounter for screening for diabetes mellitus: Secondary | ICD-10-CM | POA: Diagnosis not present

## 2019-12-11 DIAGNOSIS — Z13220 Encounter for screening for lipoid disorders: Secondary | ICD-10-CM | POA: Diagnosis not present

## 2019-12-11 DIAGNOSIS — Z23 Encounter for immunization: Secondary | ICD-10-CM

## 2019-12-11 NOTE — Patient Instructions (Signed)
Preventive Care 38 Years and Older, Female Preventive care refers to lifestyle choices and visits with your health care provider that can promote health and wellness. This includes:  A yearly physical exam. This is also called an annual well check.  Regular dental and eye exams.  Immunizations.  Screening for certain conditions.  Healthy lifestyle choices, such as diet and exercise. What can I expect for my preventive care visit? Physical exam Your health care provider will check:  Height and weight. These may be used to calculate body mass index (BMI), which is a measurement that tells if you are at a healthy weight.  Heart rate and blood pressure.  Your skin for abnormal spots. Counseling Your health care provider may ask you questions about:  Alcohol, tobacco, and drug use.  Emotional well-being.  Home and relationship well-being.  Sexual activity.  Eating habits.  History of falls.  Memory and ability to understand (cognition).  Work and work Statistician.  Pregnancy and menstrual history. What immunizations do I need?  Influenza (flu) vaccine  This is recommended every year. Tetanus, diphtheria, and pertussis (Tdap) vaccine  You may need a Td booster every 10 years. Varicella (chickenpox) vaccine  You may need this vaccine if you have not already been vaccinated. Zoster (shingles) vaccine  You may need this after age 33. Pneumococcal conjugate (PCV13) vaccine  One dose is recommended after age 33. Pneumococcal polysaccharide (PPSV23) vaccine  One dose is recommended after age 72. Measles, mumps, and rubella (MMR) vaccine  You may need at least one dose of MMR if you were born in 1957 or later. You may also need a second dose. Meningococcal conjugate (MenACWY) vaccine  You may need this if you have certain conditions. Hepatitis A vaccine  You may need this if you have certain conditions or if you travel or work in places where you may be exposed  to hepatitis A. Hepatitis B vaccine  You may need this if you have certain conditions or if you travel or work in places where you may be exposed to hepatitis B. Haemophilus influenzae type b (Hib) vaccine  You may need this if you have certain conditions. You may receive vaccines as individual doses or as more than one vaccine together in one shot (combination vaccines). Talk with your health care provider about the risks and benefits of combination vaccines. What tests do I need? Blood tests  Lipid and cholesterol levels. These may be checked every 5 years, or more frequently depending on your overall health.  Hepatitis C test.  Hepatitis B test. Screening  Lung cancer screening. You may have this screening every year starting at age 39 if you have a 30-pack-year history of smoking and currently smoke or have quit within the past 15 years.  Colorectal cancer screening. All adults should have this screening starting at age 36 and continuing until age 15. Your health care provider may recommend screening at age 23 if you are at increased risk. You will have tests every 1-10 years, depending on your results and the type of screening test.  Diabetes screening. This is done by checking your blood sugar (glucose) after you have not eaten for a while (fasting). You may have this done every 1-3 years.  Mammogram. This may be done every 1-2 years. Talk with your health care provider about how often you should have regular mammograms.  BRCA-related cancer screening. This may be done if you have a family history of breast, ovarian, tubal, or peritoneal cancers.  Other tests  Sexually transmitted disease (STD) testing.  Bone density scan. This is done to screen for osteoporosis. You may have this done starting at age 39. Follow these instructions at home: Eating and drinking  Eat a diet that includes fresh fruits and vegetables, whole grains, lean protein, and low-fat dairy products. Limit  your intake of foods with high amounts of sugar, saturated fats, and salt.  Take vitamin and mineral supplements as recommended by your health care provider.  Do not drink alcohol if your health care provider tells you not to drink.  If you drink alcohol: ? Limit how much you have to 0-1 drink a day. ? Be aware of how much alcohol is in your drink. In the [place] one drink equals one 12 oz bottle of beer (355 mL), one 5 oz glass of wine (148 mL), or one 1 oz glass of hard liquor (44 mL). Lifestyle  Take daily care of your teeth and gums.  Stay active. Exercise for at least 30 minutes on 5 or more days each week.  Do not use any products that contain nicotine or tobacco, such as cigarettes, e-cigarettes, and chewing tobacco. If you need help quitting, ask your health care provider.  If you are sexually active, practice safe sex. Use a condom or other form of protection in order to prevent STIs (sexually transmitted infections).  Talk with your health care provider about taking a low-dose aspirin or statin. What's next?  Go to your health care provider once a year for a well check visit.  Ask your health care provider how often you should have your eyes and teeth checked.  Stay up to date on all vaccines. This information is not intended to replace advice given to you by your health care provider. Make sure you discuss any questions you have with your health care provider. Document Revised: 02/16/2018 Document Reviewed: 02/16/2018 Elsevier Patient Education  [place].  Consider Shingrix vaccine

## 2019-12-11 NOTE — Progress Notes (Signed)
Established Patient Office Visit  Subjective:  Patient ID: Tara Martin, female    DOB: [birth year]  Age: 79 y.o. MRN: [id number]  CC: No chief complaint on file.   HPI DENNIS KILLILEA presents for physical exam.  Past medical history significant for history of migraine headaches and prediabetes.  There has been some question of ADD in past   She saw some other provider in the past and at one point was on Adderall but is not taking this for quite some time.  She does feel she still has some issues with focus.  She has had previous surgical history for cholecystectomy.  She still sees GYN regularly.  Social history she is married and has 1 daughter and son-in-law and 2 grandchildren.  Her daughter lives in [place].  No history of smoking.  No alcohol use.  Husband had lymphoma last year and has just recently finished up treatment  Family history-mother with history of type 2 diabetes and CAD history.  Brother with history of CAD.  Her father died around age 98 of complications from an accident.  Health maintenance -Needs flu vaccine -No history of hepatitis C testing -Pneumonia vaccines complete -Has received Covid vaccines -No history of shingles vaccine -Tetanus due 2025 -Mammogram up-to-date  Past Medical History:  Diagnosis Date  . Anxiety state, unspecified - sees Dr. Nori Riis in gyn and benzo prescribed there 04/09/2013  . Arthritis   . Chicken pox   . Lupus ([place])   . Migraines   . Prediabetes     Past Surgical History:  Procedure Laterality Date  . BACK SURGERY  2000  . CHOLECYSTECTOMY    . DILATION AND CURETTAGE OF UTERUS  1973  . GALLBLADDER SURGERY  1974    Family History  Problem Relation Age of Onset  . Arthritis Mother   . Diabetes Mother   . Heart disease Mother   . Heart attack Mother   . Pneumonia Father   . COPD Sister   . Heart disease Brother   . Pulmonary embolism Brother   . Alzheimer's disease Brother   . Alzheimer's disease Brother   .  Alcoholism Other   . Arthritis Other   . Hypertension Other   . Sudden death Other   . Migraines Daughter   . Colon cancer Neg Hx     Social History   Socioeconomic History  . Marital status: Married    Spouse name: Not on file  . Number of children: Not on file  . Years of education: Not on file  . Highest education level: Not on file  Occupational History  . Not on file  Tobacco Use  . Smoking status: Never Smoker  . Smokeless tobacco: Never Used  Vaping Use  . Vaping Use: Never used  Substance and Sexual Activity  . Alcohol use: Yes    Alcohol/week: 2.0 standard drinks    Types: 2 Glasses of wine per week  . Drug use: No  . Sexual activity: Not on file  Other Topics Concern  . Not on file  Social History Narrative   Work or School: Product manager Situation: lives with husband, husband sees Dr. Elease Hashimoto      Spiritual Beliefs:       Lifestyle: no regular exercise            Social Determinants of Health   Financial Resource Strain:   . Difficulty of Paying Living Expenses: Not on file  Food  Insecurity:   . Worried About Charity fundraiser in the Last Year: Not on file  . Ran Out of Food in the Last Year: Not on file  Transportation Needs:   . Lack of Transportation (Medical): Not on file  . Lack of Transportation (Non-Medical): Not on file  Physical Activity:   . Days of Exercise per Week: Not on file  . Minutes of Exercise per Session: Not on file  Stress:   . Feeling of Stress : Not on file  Social Connections:   . Frequency of Communication with Friends and Family: Not on file  . Frequency of Social Gatherings with Friends and Family: Not on file  . Attends Religious Services: Not on file  . Active Member of Clubs or Organizations: Not on file  . Attends Archivist Meetings: Not on file  . Marital Status: Not on file  Intimate Partner Violence:   . Fear of Current or Ex-Partner: Not on file  . Emotionally Abused: Not on file  .  Physically Abused: Not on file  . Sexually Abused: Not on file    Outpatient Medications Prior to Visit  Medication Sig Dispense Refill  . ALPRAZolam (XANAX) 0.5 MG tablet Take 0.5 mg by mouth as needed for anxiety.     Marland Kitchen aspirin 81 MG tablet Take 81 mg by mouth daily.    . Cholecalciferol (VITAMIN D PO) Take by mouth daily.    . Multiple Vitamin (MULTIVITAMIN) tablet Take 1 tablet by mouth daily.    Marland Kitchen amphetamine-dextroamphetamine (ADDERALL XR) 20 MG 24 hr capsule dextroamphetamine-amphetamine ER 20 mg 24hr capsule,extend release  TAKE 2 CAPSULES BY MOUTH EVERY DAY (Patient not taking: Reported on 12/11/2019)    . diclofenac sodium (VOLTAREN) 1 % GEL Apply 3 gm to 3 large joints up to 3 times a day.Dispense 3 tubes with 3 refills. (Patient not taking: Reported on 12/11/2019) 3 Tube 1  . donepezil (ARICEPT) 5 MG tablet TAKE 1 TABLET BY MOUTH EVERYDAY AT BEDTIME (Patient not taking: Reported on 12/11/2019) 30 tablet 1  . Omega-3 Fatty Acids (FISH OIL) 1200 MG CPDR Take by mouth daily. (Patient not taking: Reported on 12/11/2019)     No facility-administered medications prior to visit.    Allergies  Allergen Reactions  . Codeine     Per patient made her "flip out"  . Penicillins Hives  . Sulfa Antibiotics Hives    ROS Review of Systems  Constitutional: Negative for activity change, appetite change, fatigue, fever and unexpected weight change.  HENT: Negative for ear pain, hearing loss, sore throat and trouble swallowing.   Eyes: Negative for visual disturbance.  Respiratory: Negative for cough and shortness of breath.   Cardiovascular: Negative for chest pain and palpitations.  Gastrointestinal: Negative for abdominal pain, blood in stool, constipation and diarrhea.  Genitourinary: Negative for dysuria and hematuria.  Musculoskeletal: Negative for arthralgias, back pain and myalgias.  Skin: Negative for rash.  Neurological: Negative for dizziness, syncope and headaches.    Hematological: Negative for adenopathy.  Psychiatric/Behavioral: Negative for confusion and dysphoric mood.      Objective:    Physical Exam Constitutional:      Appearance: She is well-developed.  HENT:     Head: Normocephalic and atraumatic.  Eyes:     Pupils: Pupils are equal, round, and reactive to light.  Neck:     Thyroid: No thyromegaly.  Cardiovascular:     Rate and Rhythm: Normal rate and regular rhythm.  Heart sounds: Normal heart sounds. No murmur heard.   Pulmonary:     Effort: No respiratory distress.     Breath sounds: Normal breath sounds. No wheezing or rales.  Abdominal:     General: Bowel sounds are normal. There is no distension.     Palpations: Abdomen is soft. There is no mass.     Tenderness: There is no abdominal tenderness. There is no guarding or rebound.  Musculoskeletal:        General: Normal range of motion.     Cervical back: Normal range of motion and neck supple.  Lymphadenopathy:     Cervical: No cervical adenopathy.  Skin:    Findings: No rash.  Neurological:     Mental Status: She is alert and oriented to person, place, and time.     Cranial Nerves: No cranial nerve deficit.     Deep Tendon Reflexes: Reflexes normal.  Psychiatric:        Behavior: Behavior normal.        Thought Content: Thought content normal.        Judgment: Judgment normal.     BP 122/64 (BP Location: Left Arm, Patient Position: Sitting, Cuff Size: Normal)   Pulse 68   Temp 98.6 F (37 C) (Oral)   Ht 5\' 3"  (1.6 m)   Wt 140 lb (63.5 kg)   SpO2 98%   BMI 24.80 kg/m  Wt Readings from Last 3 Encounters:  12/11/19 140 lb (63.5 kg)  08/21/19 143 lb 11.2 oz (65.2 kg)  10/18/18 146 lb 11.2 oz (66.5 kg)     Health Maintenance Due  Topic Date Due  . Hepatitis C Screening  Never done    There are no preventive care reminders to display for this patient.  Lab Results  Component Value Date   TSH 3.92 10/18/2018   Lab Results  Component Value Date    WBC 4.0 10/18/2018   HGB 12.8 10/18/2018   HCT 38.6 10/18/2018   MCV 92.2 10/18/2018   PLT 192.0 10/18/2018   Lab Results  Component Value Date   NA 135 10/18/2018   K 4.9 10/18/2018   CO2 25 10/18/2018   GLUCOSE 90 10/18/2018   BUN 14 10/18/2018   CREATININE 0.92 10/18/2018   BILITOT 0.3 02/18/2017   ALKPHOS 74 11/05/2014   AST 24 02/18/2017   ALT 20 02/18/2017   PROT 6.8 02/18/2017   ALBUMIN 4.2 11/05/2014   CALCIUM 9.4 10/18/2018   ANIONGAP 14 02/12/2014   GFR 59.21 (L) 10/18/2018   Lab Results  Component Value Date   CHOL 187 01/25/2017   Lab Results  Component Value Date   HDL 34.90 (L) 01/25/2017   Lab Results  Component Value Date   LDLCALC 128 (H) 01/25/2017   Lab Results  Component Value Date   TRIG 124.0 01/25/2017   Lab Results  Component Value Date   CHOLHDL 5 01/25/2017   Lab Results  Component Value Date   HGBA1C 6.2 10/18/2018      Assessment & Plan:   Problem List Items Addressed This Visit    None    Visit Diagnoses    Routine general medical examination at a health care facility    -  Primary   Relevant Orders   Basic metabolic panel   Lipid panel   CBC with Differential/Platelet   TSH   Hepatic function panel   Hemoglobin A1c   Need for influenza vaccination       Relevant  Orders   Flu Vaccine QUAD High Dose(Fluad) (Completed)    Flu vaccine given  We discussed shingles vaccine and she will check at pharmacy and check with insurance for coverage  Obtain screening labs including A1c with her history of prediabetes range blood sugars  No orders of the defined types were placed in this encounter.   Follow-up: No follow-ups on file.    Carolann Littler, MD

## 2019-12-12 LAB — CBC WITH DIFFERENTIAL/PLATELET
Absolute Monocytes: 356 cells/uL (ref 200–950)
Basophils Absolute: 59 cells/uL (ref 0–200)
Basophils Relative: 1.8 %
Eosinophils Absolute: 69 cells/uL (ref 15–500)
Eosinophils Relative: 2.1 %
HCT: 36.7 % (ref 35.0–45.0)
Hemoglobin: 12.1 g/dL (ref 11.7–15.5)
Lymphs Abs: 772 cells/uL — ABNORMAL LOW (ref 850–3900)
MCH: 29.4 pg (ref 27.0–33.0)
MCHC: 33 g/dL (ref 32.0–36.0)
MCV: 89.3 fL (ref 80.0–100.0)
MPV: 9.7 fL (ref 7.5–12.5)
Monocytes Relative: 10.8 %
Neutro Abs: 2043 cells/uL (ref 1500–7800)
Neutrophils Relative %: 61.9 %
Platelets: 297 10*3/uL (ref 140–400)
RBC: 4.11 10*6/uL (ref 3.80–5.10)
RDW: 13.3 % (ref 11.0–15.0)
Total Lymphocyte: 23.4 %
WBC: 3.3 10*3/uL — ABNORMAL LOW (ref 3.8–10.8)

## 2019-12-12 LAB — HEPATIC FUNCTION PANEL
AG Ratio: 1.3 (calc) (ref 1.0–2.5)
ALT: 15 U/L (ref 6–29)
AST: 22 U/L (ref 10–35)
Albumin: 4.1 g/dL (ref 3.6–5.1)
Alkaline phosphatase (APISO): 74 U/L (ref 37–153)
Bilirubin, Direct: 0.1 mg/dL (ref 0.0–0.2)
Globulin: 3.2 g/dL (calc) (ref 1.9–3.7)
Indirect Bilirubin: 0.3 mg/dL (calc) (ref 0.2–1.2)
Total Bilirubin: 0.4 mg/dL (ref 0.2–1.2)
Total Protein: 7.3 g/dL (ref 6.1–8.1)

## 2019-12-12 LAB — LIPID PANEL
Cholesterol: 190 mg/dL (ref <200)
HDL: 33 mg/dL — ABNORMAL LOW (ref >=50)
LDL Cholesterol (Calc): 129 mg/dL (calc) — ABNORMAL HIGH
Non-HDL Cholesterol (Calc): 157 mg/dL (calc) — ABNORMAL HIGH (ref <130)
Total CHOL/HDL Ratio: 5.8 (calc) — ABNORMAL HIGH (ref <5.0)
Triglycerides: 166 mg/dL — ABNORMAL HIGH (ref <150)

## 2019-12-12 LAB — BASIC METABOLIC PANEL
BUN: 13 mg/dL (ref 7–25)
CO2: 24 mmol/L (ref 20–32)
Calcium: 9.5 mg/dL (ref 8.6–10.4)
Chloride: 105 mmol/L (ref 98–110)
Creat: 0.84 mg/dL (ref 0.60–0.93)
Glucose, Bld: 109 mg/dL — ABNORMAL HIGH (ref 65–99)
Potassium: 4.3 mmol/L (ref 3.5–5.3)
Sodium: 137 mmol/L (ref 135–146)

## 2019-12-12 LAB — TSH: TSH: 4.16 mIU/L (ref 0.40–4.50)

## 2019-12-12 LAB — HEMOGLOBIN A1C
Hgb A1c MFr Bld: 6 % of total Hgb — ABNORMAL HIGH (ref <5.7)
Mean Plasma Glucose: 126 (calc)
eAG (mmol/L): 7 (calc)

## 2020-02-02 ENCOUNTER — Encounter (HOSPITAL_COMMUNITY): Payer: Self-pay

## 2020-02-02 ENCOUNTER — Other Ambulatory Visit: Payer: Self-pay

## 2020-02-02 ENCOUNTER — Emergency Department (HOSPITAL_COMMUNITY): Payer: Medicare HMO

## 2020-02-02 ENCOUNTER — Emergency Department (HOSPITAL_COMMUNITY)
Admission: EM | Admit: 2020-02-02 | Discharge: 2020-02-02 | Disposition: A | Discharge status: Home / Self Care | Payer: Medicare HMO | Attending: Emergency Medicine | Admitting: Emergency Medicine

## 2020-02-02 DIAGNOSIS — M542 Cervicalgia: Secondary | ICD-10-CM | POA: Diagnosis not present

## 2020-02-02 DIAGNOSIS — M84373A Stress fracture, unspecified ankle, initial encounter for fracture: Secondary | ICD-10-CM | POA: Diagnosis not present

## 2020-02-02 DIAGNOSIS — S8252XA Displaced fracture of medial malleolus of left tibia, initial encounter for closed fracture: Secondary | ICD-10-CM | POA: Diagnosis not present

## 2020-02-02 DIAGNOSIS — D329 Benign neoplasm of meninges, unspecified: Secondary | ICD-10-CM | POA: Insufficient documentation

## 2020-02-02 DIAGNOSIS — S82852A Displaced trimalleolar fracture of left lower leg, initial encounter for closed fracture: Secondary | ICD-10-CM

## 2020-02-02 DIAGNOSIS — S0232XA Fracture of orbital floor, left side, initial encounter for closed fracture: Secondary | ICD-10-CM | POA: Diagnosis not present

## 2020-02-02 DIAGNOSIS — Z7982 Long term (current) use of aspirin: Secondary | ICD-10-CM | POA: Diagnosis not present

## 2020-02-02 DIAGNOSIS — W108XXA Fall (on) (from) other stairs and steps, initial encounter: Secondary | ICD-10-CM | POA: Diagnosis not present

## 2020-02-02 DIAGNOSIS — R519 Headache, unspecified: Secondary | ICD-10-CM | POA: Diagnosis not present

## 2020-02-02 DIAGNOSIS — S8992XA Unspecified injury of left lower leg, initial encounter: Secondary | ICD-10-CM | POA: Diagnosis not present

## 2020-02-02 DIAGNOSIS — M25572 Pain in left ankle and joints of left foot: Secondary | ICD-10-CM | POA: Diagnosis not present

## 2020-02-02 DIAGNOSIS — S82855A Nondisplaced trimalleolar fracture of left lower leg, initial encounter for closed fracture: Secondary | ICD-10-CM | POA: Diagnosis not present

## 2020-02-02 DIAGNOSIS — S99912A Unspecified injury of left ankle, initial encounter: Secondary | ICD-10-CM | POA: Diagnosis present

## 2020-02-02 MED ORDER — HYDROCODONE-ACETAMINOPHEN 5-325 MG PO TABS
1.0000 | ORAL_TABLET | Freq: Four times a day (QID) | ORAL | 0 refills | Status: DC | PRN
Start: 2020-02-02 — End: 2020-05-12

## 2020-02-02 MED ORDER — HYDROCODONE-ACETAMINOPHEN 5-325 MG PO TABS
1.0000 | ORAL_TABLET | Freq: Once | ORAL | Status: AC
  Administered 2020-02-02: 1 via ORAL
  Filled 2020-02-02: qty 1

## 2020-02-02 NOTE — ED Triage Notes (Signed)
Pt reports falling down several stairs at home. C/o left ankle pain.

## 2020-02-02 NOTE — ED Provider Notes (Signed)
[facility] DEPT Provider Note   CSN: [id number] Arrival date & time: 02/02/20  2119     History Chief Complaint  Patient presents with  . Ankle Pain    Tara Martin is a 79 y.o. female with a past medical history significant for anxiety and lupus who presents to the ED after mechanical fall.  Patient states she was moving a rolling cart and slipped down 4 flights of stairs.  Patient denies head injury and loss of consciousness; however, has ecchymosis surrounding her left eye.  Patient is not currently on any blood thinners. Patient admits to severe, 10/10 left ankle pain worse with movement.  She has been unable to bear weight following the accident.  Left ankle pain associated with edema, decreased ROM, and ecchymosis. No previous left ankle injury.  Patient has taken Tylenol with moderate relief. Patient denies headache, neck pain, back pain, visual changes, speech changes, unilateral weakness, abdominal pain, nausea and vomiting.   History obtained from patient and past medical records. No interpreter used during encounter.      Past Medical History:  Diagnosis Date  . Anxiety state, unspecified - sees Dr. Nori Riis in gyn and benzo prescribed there 04/09/2013  . Arthritis   . Chicken pox   . Lupus ([place])   . Migraines   . Prediabetes     Patient Active Problem List   Diagnosis Date Noted  . Acute midline low back pain with bilateral sciatica 11/05/2014  . Facial numbness 02/11/2014  . Prediabetes   . Lupus - followed by rheum 04/09/2013  . Anxiety state 04/09/2013    Past Surgical History:  Procedure Laterality Date  . BACK SURGERY  2000  . CHOLECYSTECTOMY    . DILATION AND CURETTAGE OF UTERUS  1973  . GALLBLADDER SURGERY  1974     OB History   No obstetric history on file.     Family History  Problem Relation Age of Onset  . Arthritis Mother   . Diabetes Mother   . Heart disease Mother   . Heart attack Mother   . Pneumonia  Father   . COPD Sister   . Heart disease Brother   . Pulmonary embolism Brother   . Alzheimer's disease Brother   . Alzheimer's disease Brother   . Alcoholism Other   . Arthritis Other   . Hypertension Other   . Sudden death Other   . Migraines Daughter   . Colon cancer Neg Hx     Social History   Tobacco Use  . Smoking status: Never Smoker  . Smokeless tobacco: Never Used  Vaping Use  . Vaping Use: Never used  Substance Use Topics  . Alcohol use: Yes    Alcohol/week: 2.0 standard drinks    Types: 2 Glasses of wine per week  . Drug use: No    Home Medications Prior to Admission medications   Medication Sig Start Date End Date Taking? Authorizing Provider  ALPRAZolam Duanne Moron) 0.5 MG tablet Take 0.5 mg by mouth as needed for anxiety.    Yes Maisie Fus, MD  amphetamine-dextroamphetamine (ADDERALL XR) 20 MG 24 hr capsule Take 20 mg by mouth daily.    Yes [provider]  aspirin 81 MG tablet Take 81 mg by mouth daily.   Yes [provider]  Multiple Vitamins-Minerals (MULTIVITAMIN WITH MINERALS) tablet Take 1 tablet by mouth daily.   Yes [provider]  diclofenac sodium (VOLTAREN) 1 % GEL Apply 3 gm to  3 large joints up to 3 times a day.Dispense 3 tubes with 3 refills. Patient not taking: Reported on 12/11/2019 02/18/17   Bo Merino, MD  donepezil (ARICEPT) 5 MG tablet TAKE 1 TABLET BY MOUTH EVERYDAY AT BEDTIME Patient not taking: Reported on 12/11/2019 10/30/19   Eulas Post, MD  HYDROcodone-acetaminophen (NORCO/VICODIN) 5-325 MG tablet Take 1 tablet by mouth every 6 (six) hours as needed. 02/02/20   Suzy Bouchard, PA-C    Allergies    Codeine, Penicillins, and Sulfa antibiotics  Review of Systems   Review of Systems  Constitutional: Negative for chills and fever.  Gastrointestinal: Negative for abdominal pain, nausea and vomiting.  Musculoskeletal: Positive for arthralgias (left ankle) and joint swelling. Negative for  back pain and neck pain.  Neurological: Negative for dizziness, facial asymmetry, speech difficulty, weakness and headaches.  Psychiatric/Behavioral: Negative for confusion.  All other systems reviewed and are negative.   Physical Exam Updated Vital Signs BP 119/85   Pulse (!) 58   Temp 98.2 F (36.8 C) (Oral)   Resp (!) 26   Ht 5' 4.5" (1.638 m)   Wt 63.5 kg   SpO2 99%   BMI 23.66 kg/m   Physical Exam Vitals and nursing note reviewed.  Constitutional:      General: She is not in acute distress.    Appearance: She is not ill-appearing.  HENT:     Head: Normocephalic.      Comments: Ecchymosis surrounding the lateral portion of left eye.  Eyes:     Extraocular Movements: Extraocular movements intact.     Pupils: Pupils are equal, round, and reactive to light.     Comments: No nystagmus.   Cardiovascular:     Rate and Rhythm: Normal rate and regular rhythm.     Pulses: Normal pulses.     Heart sounds: Normal heart sounds. No murmur heard.  No friction rub. No gallop.   Pulmonary:     Effort: Pulmonary effort is normal.     Breath sounds: Normal breath sounds.  Abdominal:     General: Abdomen is flat. Bowel sounds are normal. There is no distension.     Palpations: Abdomen is soft.     Tenderness: There is no abdominal tenderness. There is no guarding or rebound.  Musculoskeletal:     Cervical back: Neck supple.     Comments: No cervical, thoracic, or lumbar midline tenderness. Tenderness over lateral malleolus with surrounding edema and ecchymosis.  Limited range of motion of left ankle.  Full range of motion of all left toes. Tenderness and ecchymosis surrounding proximal fibula. Full ROM of left knee and hip.   Skin:    General: Skin is warm and dry.  Neurological:     General: No focal deficit present.     Mental Status: She is alert.     Comments: Speech is clear, able to follow commands CN III-XII intact Normal strength in upper and lower extremities  bilaterally including dorsiflexion and plantar flexion, strong and equal grip strength Sensation grossly intact throughout Moves extremities without ataxia, coordination intact   Psychiatric:        Mood and Affect: Mood normal.        Behavior: Behavior normal.     ED Results / Procedures / Treatments   Labs (all labs ordered are listed, but only abnormal results are displayed) Labs Reviewed - No data to display  EKG None  Radiology DG Tibia/Fibula Left  Result Date: 02/02/2020 CLINICAL DATA:  Fall. EXAM: LEFT TIBIA AND FIBULA - 2 VIEW COMPARISON:  None. FINDINGS: Please see concurrent ankle radiographs for characterization of distal tibial and fibular fractures. No other fractures identified. IMPRESSION: Please see concurrent ankle radiographs for characterization of distal tibial and fibular fractures. No other fractures identified. Electronically Signed   By: Margaretha Sheffield MD   On: 02/02/2020 07:49   DG Ankle Complete Left  Result Date: 02/02/2020 CLINICAL DATA:  Fall, pain. EXAM: LEFT ANKLE COMPLETE - 3+ VIEW COMPARISON:  None. FINDINGS: Acute mildly displaced fracture of the distal fibula at the level of the tibiotalar joint. Nondisplaced fracture of the medial malleolus. Possible nondisplaced fracture of the posterior malleolus. There is borderline widening of the medial clear space. Partially imaged surgical changes of the first toe. Soft tissue swelling, greatest along the lateral ankle. IMPRESSION: 1. Acute mildly displaced fracture of the distal fibula at the level the tibiotalar joint. 2. Nondisplaced fracture of the medial malleolus. 3. Possible nondisplaced fracture of the posterior malleolus. 4. Borderline widening of the medial clear space. Electronically Signed   By: Margaretha Sheffield MD   On: 02/02/2020 07:48   CT Head Wo Contrast  Result Date: 02/02/2020 CLINICAL DATA:  Pain following fall EXAM: CT HEAD WITHOUT CONTRAST CT MAXILLOFACIAL WITHOUT CONTRAST CT  CERVICAL SPINE WITHOUT CONTRAST TECHNIQUE: Multidetector CT imaging of the head, cervical spine, and maxillofacial structures were performed using the standard protocol without intravenous contrast. Multiplanar CT image reconstructions of the cervical spine and maxillofacial structures were also generated. COMPARISON:  Brain MRI February 20, 2018 and head CT February 11, 2014 FINDINGS: CT HEAD FINDINGS Brain: There is age related volume loss, stable. There is a focal apparent meningioma immediately to the left of midline in the superior left parietal region measuring 1.1 x 10.9 cm. No other evident mass. There is no hemorrhage, extra-axial fluid collection, or midline shift. Brain parenchyma otherwise appears unremarkable. No evident acute infarct. Vascular: Slight calcification in the carotid siphon regions noted. Skull: Bony calvarium appears intact. Other: Mastoid air cells are clear. CT MAXILLOFACIAL FINDINGS Osseous: There is a subtle incomplete fracture along the anterior left orbital floor without appreciable displacement. No evident proptosis of fat or muscle from the left orbit. No other fracture is evident. No dislocation. Orbits: No intraorbital lesions are evident. Evidence of cataract removal on the left. No muscle or fat asymmetry. Sinuses: There is mild mucosal thickening in several ethmoid air cells. Other paranasal sinuses are clear. No air-fluid level. No bony destruction or expansion. Ostiomeatal unit complexes are patent bilaterally. Nares are patent bilaterally. There is mild rightward deviation of the nasal septum. Soft tissues: Soft tissue swelling over left upper face without well-defined hematoma. No evident facial abscess. Salivary glands appear symmetric and normal bilaterally. No adenopathy appreciable. Tongue and tongue base regions appear normal. Visualized pharynx appears normal. CT CERVICAL SPINE FINDINGS Alignment: There is 1 mm of retrolisthesis of C6 on C7. There is 2 mm of  anterolisthesis of C7 on T1. No other spondylolisthesis. Skull base and vertebrae: Skull base and craniocervical junction regions appear normal. There is moderate pannus posterior to the odontoid, not causing significant impression on the craniocervical junction. There is underlying osteoporosis. No appreciable fracture. No blastic or lytic bone lesions. Soft tissues and spinal canal: Prevertebral soft tissues and predental space regions are normal. There is no evident cord or canal hematoma. No paraspinous lesions are evident. Disc levels: There is moderately severe disc space narrowing at C4-5, C5-6 and C6-7 with moderate disc  space narrowing at C7-T1. There is multilevel facet osteoarthritic change. There is facet hypertrophy causing exit foraminal narrowing at C3-4 on the right, at C4-5 bilaterally, at C5-6 bilaterally, and at C6-7 bilaterally, more severe on the left than on the right. No frank disc extrusion or stenosis. Upper chest: Visualized upper lung regions are clear. Other: None IMPRESSION: Head CT: Stable 1.1 x 0.9 cm superior, medial left parietal meningioma without appreciable mass effect or edema. No other mass. No hemorrhage. No acute infarct. Slight arterial vascular calcification noted. CT maxillofacial: 1. Subtle nondisplaced fracture anterior left orbital floor without evidence of proptosis. No other fracture. No dislocation. 2. Status post cataract removal on the left. No orbital asymmetry otherwise. No intraorbital mass. 3. Mild mucosal thickening of several ethmoid air cells. Other paranasal sinuses clear. Ostiomeatal complexes patent bilaterally. 4.  Mild soft tissue swelling upper left face. 5.  Rightward deviation nasal septum. CT cervical spine: 1. No fracture. Slight spondylolisthesis at C6-7 and C7-T1 felt to be due to underlying spondylosis. 2. Multilevel arthropathy with exit foraminal narrowing at multiple levels due to bony hypertrophy/osteoarthritis. No frank disc extrusion or  stenosis. Electronically Signed   By: Lowella Grip III M.D.   On: 02/02/2020 08:11   CT Cervical Spine Wo Contrast  Result Date: 02/02/2020 CLINICAL DATA:  Pain following fall EXAM: CT HEAD WITHOUT CONTRAST CT MAXILLOFACIAL WITHOUT CONTRAST CT CERVICAL SPINE WITHOUT CONTRAST TECHNIQUE: Multidetector CT imaging of the head, cervical spine, and maxillofacial structures were performed using the standard protocol without intravenous contrast. Multiplanar CT image reconstructions of the cervical spine and maxillofacial structures were also generated. COMPARISON:  Brain MRI February 20, 2018 and head CT February 11, 2014 FINDINGS: CT HEAD FINDINGS Brain: There is age related volume loss, stable. There is a focal apparent meningioma immediately to the left of midline in the superior left parietal region measuring 1.1 x 10.9 cm. No other evident mass. There is no hemorrhage, extra-axial fluid collection, or midline shift. Brain parenchyma otherwise appears unremarkable. No evident acute infarct. Vascular: Slight calcification in the carotid siphon regions noted. Skull: Bony calvarium appears intact. Other: Mastoid air cells are clear. CT MAXILLOFACIAL FINDINGS Osseous: There is a subtle incomplete fracture along the anterior left orbital floor without appreciable displacement. No evident proptosis of fat or muscle from the left orbit. No other fracture is evident. No dislocation. Orbits: No intraorbital lesions are evident. Evidence of cataract removal on the left. No muscle or fat asymmetry. Sinuses: There is mild mucosal thickening in several ethmoid air cells. Other paranasal sinuses are clear. No air-fluid level. No bony destruction or expansion. Ostiomeatal unit complexes are patent bilaterally. Nares are patent bilaterally. There is mild rightward deviation of the nasal septum. Soft tissues: Soft tissue swelling over left upper face without well-defined hematoma. No evident facial abscess. Salivary glands  appear symmetric and normal bilaterally. No adenopathy appreciable. Tongue and tongue base regions appear normal. Visualized pharynx appears normal. CT CERVICAL SPINE FINDINGS Alignment: There is 1 mm of retrolisthesis of C6 on C7. There is 2 mm of anterolisthesis of C7 on T1. No other spondylolisthesis. Skull base and vertebrae: Skull base and craniocervical junction regions appear normal. There is moderate pannus posterior to the odontoid, not causing significant impression on the craniocervical junction. There is underlying osteoporosis. No appreciable fracture. No blastic or lytic bone lesions. Soft tissues and spinal canal: Prevertebral soft tissues and predental space regions are normal. There is no evident cord or canal hematoma. No paraspinous lesions are evident.  Disc levels: There is moderately severe disc space narrowing at C4-5, C5-6 and C6-7 with moderate disc space narrowing at C7-T1. There is multilevel facet osteoarthritic change. There is facet hypertrophy causing exit foraminal narrowing at C3-4 on the right, at C4-5 bilaterally, at C5-6 bilaterally, and at C6-7 bilaterally, more severe on the left than on the right. No frank disc extrusion or stenosis. Upper chest: Visualized upper lung regions are clear. Other: None IMPRESSION: Head CT: Stable 1.1 x 0.9 cm superior, medial left parietal meningioma without appreciable mass effect or edema. No other mass. No hemorrhage. No acute infarct. Slight arterial vascular calcification noted. CT maxillofacial: 1. Subtle nondisplaced fracture anterior left orbital floor without evidence of proptosis. No other fracture. No dislocation. 2. Status post cataract removal on the left. No orbital asymmetry otherwise. No intraorbital mass. 3. Mild mucosal thickening of several ethmoid air cells. Other paranasal sinuses clear. Ostiomeatal complexes patent bilaterally. 4.  Mild soft tissue swelling upper left face. 5.  Rightward deviation nasal septum. CT cervical  spine: 1. No fracture. Slight spondylolisthesis at C6-7 and C7-T1 felt to be due to underlying spondylosis. 2. Multilevel arthropathy with exit foraminal narrowing at multiple levels due to bony hypertrophy/osteoarthritis. No frank disc extrusion or stenosis. Electronically Signed   By: Lowella Grip III M.D.   On: 02/02/2020 08:11   CT Ankle Left Wo Contrast  Result Date: 02/02/2020 CLINICAL DATA:  Left ankle fracture EXAM: CT OF THE LEFT ANKLE WITHOUT CONTRAST TECHNIQUE: Multidetector CT imaging of the left ankle was performed according to the standard protocol. Multiplanar CT image reconstructions were also generated. COMPARISON:  X-ray 02/02/2020 FINDINGS: Bones/Joint/Cartilage Acute comminuted fracture of the distal fibular metaphysis and lateral malleolus with intra-articular extension into the distal tibiofibular joint and ankle mortise. Fracture line is predominantly obliquely oriented (series 8, image 58). There is minimal posterior displacement. Nondisplaced fracture through the base of the medial malleolus (series 7, image 84). Subtle nondisplaced fracture of the posterior cortex of the posterior malleolus (series 8, images 48-49). Alignment at the ankle mortise is congruent without widening. No dislocation. No evidence of talar dome osteochondral defect. Subtalar joint alignment is maintained. Mild degenerative changes throughout the midfoot, most pronounced at the second and third TMT joints. Os navicular noted. Ligaments Suboptimally assessed by CT. Muscles and Tendons No musculotendinous abnormality by CT. Soft tissues Soft tissue swelling most pronounced over the lateral ankle. No well-defined soft tissue hematoma. IMPRESSION: 1. Acute trimalleolar fracture of the left ankle, as described above. 2. Ankle mortise remains congruent without malalignment. 3. Prominent soft tissue swelling over the lateral ankle. Electronically Signed   By: Davina Poke D.O.   On: 02/02/2020 10:40   CT  Maxillofacial Wo Contrast  Result Date: 02/02/2020 CLINICAL DATA:  Pain following fall EXAM: CT HEAD WITHOUT CONTRAST CT MAXILLOFACIAL WITHOUT CONTRAST CT CERVICAL SPINE WITHOUT CONTRAST TECHNIQUE: Multidetector CT imaging of the head, cervical spine, and maxillofacial structures were performed using the standard protocol without intravenous contrast. Multiplanar CT image reconstructions of the cervical spine and maxillofacial structures were also generated. COMPARISON:  Brain MRI February 20, 2018 and head CT February 11, 2014 FINDINGS: CT HEAD FINDINGS Brain: There is age related volume loss, stable. There is a focal apparent meningioma immediately to the left of midline in the superior left parietal region measuring 1.1 x 10.9 cm. No other evident mass. There is no hemorrhage, extra-axial fluid collection, or midline shift. Brain parenchyma otherwise appears unremarkable. No evident acute infarct. Vascular: Slight calcification in the  carotid siphon regions noted. Skull: Bony calvarium appears intact. Other: Mastoid air cells are clear. CT MAXILLOFACIAL FINDINGS Osseous: There is a subtle incomplete fracture along the anterior left orbital floor without appreciable displacement. No evident proptosis of fat or muscle from the left orbit. No other fracture is evident. No dislocation. Orbits: No intraorbital lesions are evident. Evidence of cataract removal on the left. No muscle or fat asymmetry. Sinuses: There is mild mucosal thickening in several ethmoid air cells. Other paranasal sinuses are clear. No air-fluid level. No bony destruction or expansion. Ostiomeatal unit complexes are patent bilaterally. Nares are patent bilaterally. There is mild rightward deviation of the nasal septum. Soft tissues: Soft tissue swelling over left upper face without well-defined hematoma. No evident facial abscess. Salivary glands appear symmetric and normal bilaterally. No adenopathy appreciable. Tongue and tongue base regions  appear normal. Visualized pharynx appears normal. CT CERVICAL SPINE FINDINGS Alignment: There is 1 mm of retrolisthesis of C6 on C7. There is 2 mm of anterolisthesis of C7 on T1. No other spondylolisthesis. Skull base and vertebrae: Skull base and craniocervical junction regions appear normal. There is moderate pannus posterior to the odontoid, not causing significant impression on the craniocervical junction. There is underlying osteoporosis. No appreciable fracture. No blastic or lytic bone lesions. Soft tissues and spinal canal: Prevertebral soft tissues and predental space regions are normal. There is no evident cord or canal hematoma. No paraspinous lesions are evident. Disc levels: There is moderately severe disc space narrowing at C4-5, C5-6 and C6-7 with moderate disc space narrowing at C7-T1. There is multilevel facet osteoarthritic change. There is facet hypertrophy causing exit foraminal narrowing at C3-4 on the right, at C4-5 bilaterally, at C5-6 bilaterally, and at C6-7 bilaterally, more severe on the left than on the right. No frank disc extrusion or stenosis. Upper chest: Visualized upper lung regions are clear. Other: None IMPRESSION: Head CT: Stable 1.1 x 0.9 cm superior, medial left parietal meningioma without appreciable mass effect or edema. No other mass. No hemorrhage. No acute infarct. Slight arterial vascular calcification noted. CT maxillofacial: 1. Subtle nondisplaced fracture anterior left orbital floor without evidence of proptosis. No other fracture. No dislocation. 2. Status post cataract removal on the left. No orbital asymmetry otherwise. No intraorbital mass. 3. Mild mucosal thickening of several ethmoid air cells. Other paranasal sinuses clear. Ostiomeatal complexes patent bilaterally. 4.  Mild soft tissue swelling upper left face. 5.  Rightward deviation nasal septum. CT cervical spine: 1. No fracture. Slight spondylolisthesis at C6-7 and C7-T1 felt to be due to underlying  spondylosis. 2. Multilevel arthropathy with exit foraminal narrowing at multiple levels due to bony hypertrophy/osteoarthritis. No frank disc extrusion or stenosis. Electronically Signed   By: Lowella Grip III M.D.   On: 02/02/2020 08:11    Procedures Procedures (including critical care time)  Medications Ordered in ED Medications  HYDROcodone-acetaminophen (NORCO/VICODIN) 5-325 MG per tablet 1 tablet (1 tablet Oral Given 02/02/20 1236)    ED Course  I have reviewed the triage vital signs and the nursing notes.  Pertinent labs & imaging results that were available during my care of the patient were reviewed by me and considered in my medical decision making (see chart for details).  Clinical Course as of Feb 01 1510  Sat Feb 02, 2020  0919 Discussed case with Dr. Lyla Glassing with orthopedics who recommends CT scan and posterior splint with U.    [CA]  1110 Discussed case with Colletta Maryland with social work who is working on  getting a scooter for the patient to help with ambulation.   [CA]    Clinical Course User Index [CA] Karie Kirks   MDM Rules/Calculators/A&P                         79 year old female presents to the ED after mechanical fall down numerous flights of stairs.  Patient denies head injury and loss of consciousness however, has ecchymosis surrounding her left eye. She is not currently on any blood thinners. Patient complains of left ankle pain.  Upon arrival, vitals all within normal limits.  Patient in no acute distress and nonill appearing.  Physical exam reassuring.  No cervical, thoracic, or lumbar midline tenderness.  Normal neurological exam.  Ecchymosis surrounding lateral portion of left thigh.  EOMs intact. Tenderness over lateral malleolus with surrounding edema and ecchymosis.  Limited range of motion of left ankle. Left lower extremity neurovascularly intact. Full range of motion of all left toes. Tenderness and ecchymosis surrounding proximal  fibula. Full ROM of left knee and hip. Patient deferred pain medication at this time. Will obtain x-rays of left ankle and tib/fib to rule out bony fracture. Given ecchymosis around the left eye and patient's age, will obtain CT head, maxillofacial, and cervical spine to rule out any intracranial abnormalities and bony fractures.  X-ray personally reviewed which demonstrates: IMPRESSION:  1. Acute mildly displaced fracture of the distal fibula at the level  the tibiotalar joint.  2. Nondisplaced fracture of the medial malleolus.  3. Possible nondisplaced fracture of the posterior malleolus.  4. Borderline widening of the medial clear space.   CT head, neck, and maxillofacial personally reviewed which demonstrates: Head CT: Stable 1.1 x 0.9 cm superior, medial left parietal  meningioma without appreciable mass effect or edema. No other mass.  No hemorrhage. No acute infarct.    Slight arterial vascular calcification noted.    CT maxillofacial:    1. Subtle nondisplaced fracture anterior left orbital floor without  evidence of proptosis. No other fracture. No dislocation.    2. Status post cataract removal on the left. No orbital asymmetry  otherwise. No intraorbital mass.    3. Mild mucosal thickening of several ethmoid air cells. Other  paranasal sinuses clear. Ostiomeatal complexes patent bilaterally.    4. Mild soft tissue swelling upper left face.    5. Rightward deviation nasal septum.    CT cervical spine:    1. No fracture. Slight spondylolisthesis at C6-7 and C7-T1 felt to  be due to underlying spondylosis.    2. Multilevel arthropathy with exit foraminal narrowing at multiple  levels due to bony hypertrophy/osteoarthritis. No frank disc  extrusion or stenosis.   No entrapment and no visual changes. Will have patient follow-up with ENT to further evaluation of orbital floor fracture. Dr. Redmond Baseman number given to patient at discharge. Advised patient to call  Monday to schedule an appointment for further evaluation. Neurosurgery number given to patient to follow-up on meningioma.   CT ankle personally reviewed which demonstrates: IMPRESSION:  1. Acute trimalleolar fracture of the left ankle, as described  above.  2. Ankle mortise remains congruent without malalignment.  3. Prominent soft tissue swelling over the lateral ankle.    Discussed case with Dr. Roderic Palau who evaluated patient at bedside and agrees with assessment and plan. Patient discharged with hydrocodone for severe pain. Database reviewed. Orthopedics number given at discharged. Strict ED precautions discussed with patient. Patient states understanding and agrees to  plan. Patient discharged home in no acute distress and stable vitals.  Final Clinical Impression(s) / ED Diagnoses Final diagnoses:  Closed trimalleolar fracture of left ankle, initial encounter  Closed fracture of left orbital floor, initial encounter [facility])  Meningioma [facility])    Rx / DC Orders ED Discharge Orders         Ordered    HYDROcodone-acetaminophen (NORCO/VICODIN) 5-325 MG tablet  Every 6 hours PRN        02/02/20 1508           Suzy Bouchard, PA-C 02/02/20 1511    Milton Ferguson, MD 02/03/20 0710

## 2020-02-02 NOTE — TOC Initial Note (Addendum)
Transition of Care [facility]) - Initial/Assessment Note    Patient Details  Name: Tara Martin MRN: [id number] Date of Birth: 1940/08/12  Transition of Care [facility]) CM/SW Contact:    Verdell Carmine, RN Phone Number: 02/02/2020, 11:29 AM  Clinical Narrative:                 Patient came to ED after a fall, ankle fracture will be nonweight bearing with surgery scheduled for next week. PT paged to give input on correct equipment for this patient who cannot use crutches.  Lightweight wheelchair will give the patient the safest way to navigate, this was ordered as well as the3:1hrough adapt. The patient stated to CSW that she has a bedroom suite on the main floor and is having a friend who is  On the squad help her this week.  Consulted with the team who states she does not require home health at this point. Patient will be discharged after receiving DME.    Barriers to Discharge: Equipment Delay   Patient Goals and CMS Choice        Expected Discharge Plan and Services                                                Prior Living Arrangements/Services                       Activities of Daily Living      Permission Sought/Granted                  Emotional Assessment              Admission diagnosis:  Left ankle injury Patient Active Problem List   Diagnosis Date Noted  . Acute midline low back pain with bilateral sciatica 11/05/2014  . Facial numbness 02/11/2014  . Prediabetes   . Lupus - followed by rheum 04/09/2013  . Anxiety state 04/09/2013   PCP:  Eulas Post, MD Pharmacy:   [id number] [address], [place] [address] Phone: [phone] Fax: [phone]     Social Determinants of Health (SDOH) Interventions    Readmission Risk Interventions No flowsheet data found.

## 2020-02-02 NOTE — Discharge Instructions (Addendum)
As discussed, I am sending you home with some pain medication.  Take as needed for severe pain.  Your scans showed you have a broken ankle, broken left eye bone, and a meningioma in your brain. I have also given you the number of the orthopedic surgeon, ear nose and throat doctor, and the neurosurgeon.  Please call Monday to schedule appointment for further evaluation.  Return to the ER for new or worsening symptoms.

## 2020-02-02 NOTE — ED Notes (Signed)
  Pt transported to ct 

## 2020-02-02 NOTE — Progress Notes (Signed)
Orthopedic Tech Progress Note Patient Details:  Tara Martin 02/21/1941 [id number]  Ortho Devices Type of Ortho Device: Ace wrap, Short leg splint, Stirrup splint Ortho Device/Splint Location: left Ortho Device/Splint Interventions: Application   Post Interventions Patient Tolerated: Well Instructions Provided: Care of device   Tara Martin 02/02/2020, 9:34 AM

## 2020-02-06 DIAGNOSIS — S82855A Nondisplaced trimalleolar fracture of left lower leg, initial encounter for closed fracture: Secondary | ICD-10-CM | POA: Diagnosis not present

## 2020-02-07 DIAGNOSIS — S0230XA Fracture of orbital floor, unspecified side, initial encounter for closed fracture: Secondary | ICD-10-CM | POA: Diagnosis not present

## 2020-02-19 ENCOUNTER — Telehealth: Payer: Self-pay | Admitting: Family Medicine

## 2020-02-19 DIAGNOSIS — R4189 Other symptoms and signs involving cognitive functions and awareness: Secondary | ICD-10-CM

## 2020-02-19 DIAGNOSIS — D329 Benign neoplasm of meninges, unspecified: Secondary | ICD-10-CM | POA: Diagnosis not present

## 2020-02-19 NOTE — Telephone Encounter (Signed)
Spoke with husband.  Patient had unfortunately recent follow-up with complicated ankle fracture.  He is concerned that she is starting to lose her memory more more.  She had seen neurologist previously with [facility].  Husband would like to see them again.  She had CT head which showed approximately 1.1 x 0.9 cm meningioma which is likely incidental finding.  We had started her recently on Aricept but she has not been taking this consistently.

## 2020-02-20 DIAGNOSIS — S82855A Nondisplaced trimalleolar fracture of left lower leg, initial encounter for closed fracture: Secondary | ICD-10-CM | POA: Diagnosis not present

## 2020-02-20 DIAGNOSIS — S82855D Nondisplaced trimalleolar fracture of left lower leg, subsequent encounter for closed fracture with routine healing: Secondary | ICD-10-CM | POA: Diagnosis not present

## 2020-03-03 DIAGNOSIS — M84373A Stress fracture, unspecified ankle, initial encounter for fracture: Secondary | ICD-10-CM | POA: Diagnosis not present

## 2020-03-14 DIAGNOSIS — S82855D Nondisplaced trimalleolar fracture of left lower leg, subsequent encounter for closed fracture with routine healing: Secondary | ICD-10-CM | POA: Diagnosis not present

## 2020-03-29 ENCOUNTER — Other Ambulatory Visit: Payer: Self-pay | Admitting: Family Medicine

## 2020-04-02 ENCOUNTER — Other Ambulatory Visit: Payer: Self-pay | Admitting: Family Medicine

## 2020-04-03 DIAGNOSIS — M84373A Stress fracture, unspecified ankle, initial encounter for fracture: Secondary | ICD-10-CM | POA: Diagnosis not present

## 2020-04-14 DIAGNOSIS — S82855D Nondisplaced trimalleolar fracture of left lower leg, subsequent encounter for closed fracture with routine healing: Secondary | ICD-10-CM | POA: Diagnosis not present

## 2020-04-16 DIAGNOSIS — S82855D Nondisplaced trimalleolar fracture of left lower leg, subsequent encounter for closed fracture with routine healing: Secondary | ICD-10-CM | POA: Diagnosis not present

## 2020-04-21 DIAGNOSIS — S82855D Nondisplaced trimalleolar fracture of left lower leg, subsequent encounter for closed fracture with routine healing: Secondary | ICD-10-CM | POA: Diagnosis not present

## 2020-04-25 ENCOUNTER — Other Ambulatory Visit: Payer: Self-pay

## 2020-04-28 ENCOUNTER — Ambulatory Visit: Payer: Medicare HMO | Admitting: Family Medicine

## 2020-04-28 DIAGNOSIS — S82855D Nondisplaced trimalleolar fracture of left lower leg, subsequent encounter for closed fracture with routine healing: Secondary | ICD-10-CM | POA: Diagnosis not present

## 2020-04-29 ENCOUNTER — Ambulatory Visit: Payer: Medicare HMO | Admitting: Family Medicine

## 2020-04-29 DIAGNOSIS — Z0289 Encounter for other administrative examinations: Secondary | ICD-10-CM

## 2020-04-30 DIAGNOSIS — S82855D Nondisplaced trimalleolar fracture of left lower leg, subsequent encounter for closed fracture with routine healing: Secondary | ICD-10-CM | POA: Diagnosis not present

## 2020-05-05 DIAGNOSIS — S82855D Nondisplaced trimalleolar fracture of left lower leg, subsequent encounter for closed fracture with routine healing: Secondary | ICD-10-CM | POA: Diagnosis not present

## 2020-05-07 DIAGNOSIS — Z4689 Encounter for fitting and adjustment of other specified devices: Secondary | ICD-10-CM | POA: Diagnosis not present

## 2020-05-09 DIAGNOSIS — S82855D Nondisplaced trimalleolar fracture of left lower leg, subsequent encounter for closed fracture with routine healing: Secondary | ICD-10-CM | POA: Diagnosis not present

## 2020-05-12 ENCOUNTER — Other Ambulatory Visit: Payer: Self-pay

## 2020-05-12 ENCOUNTER — Ambulatory Visit (INDEPENDENT_AMBULATORY_CARE_PROVIDER_SITE_OTHER): Payer: Medicare HMO | Admitting: Family Medicine

## 2020-05-12 ENCOUNTER — Encounter: Payer: Self-pay | Admitting: Family Medicine

## 2020-05-12 VITALS — BP 112/70 | HR 74 | Temp 98.2°F | Ht 64.0 in | Wt 143.3 lb

## 2020-05-12 DIAGNOSIS — R7303 Prediabetes: Secondary | ICD-10-CM | POA: Diagnosis not present

## 2020-05-12 DIAGNOSIS — G603 Idiopathic progressive neuropathy: Secondary | ICD-10-CM | POA: Diagnosis not present

## 2020-05-12 LAB — VITAMIN B12: Vitamin B-12: >1506 pg/mL — ABNORMAL HIGH (ref 211–911)

## 2020-05-12 LAB — HEMOGLOBIN A1C: Hgb A1c MFr Bld: 6.1 % (ref 4.6–6.5)

## 2020-05-12 NOTE — Addendum Note (Signed)
Addended by: Tessie Fass D on: 05/12/2020 02:56 PM   Modules accepted: Orders

## 2020-05-12 NOTE — Progress Notes (Signed)
Established Patient Office Visit  Subjective:  Patient ID: Tara Martin, female    DOB: 1940-07-19  Age: 80 y.o. MRN: [id number]  CC:  Chief Complaint  Patient presents with  . pain in leg    Pain in both legs , burnings, no numbness or tingling, having pain in jaw rt started this morning , pain in rt ear , no headache    HPI Tara Martin presents for painful burning sensation in both legs from the knee down toward the ankle really for several months.  She did have fall and injury with fracture left ankle which required surgery.  Her pain preceded that.  She denies any low back pain.  She estimates her pain has been for about 6 months.  Pain is somewhat intermittent.  Daytime actually seems slightly worse than nighttime.  No pain with ambulation.  No significant alcohol use.  No family history of neuropathy.  No past history of diabetes.  Last A1c was 6.0%.  Does have strong family history of diabetes.  No prior chemotherapy.  Estimates that her pain is generally 4-5 out of 10 in severity at its worse.  Denies any upper extremity symptoms.  No associated weakness.  No clear exacerbating factors.  She does have some mild memory disturbance and has seen neurology in the past.  Most recent visit was over 2 years ago.  Previous B12 level normal.  She had thyroid function in the fall which were normal.  Past Medical History:  Diagnosis Date  . Anxiety state, unspecified - sees Dr. Nori Riis in gyn and benzo prescribed there 04/09/2013  . Arthritis   . Chicken pox   . Lupus ([place])   . Migraines   . Prediabetes     Past Surgical History:  Procedure Laterality Date  . BACK SURGERY  2000  . CHOLECYSTECTOMY    . DILATION AND CURETTAGE OF UTERUS  1973  . GALLBLADDER SURGERY  1974    Family History  Problem Relation Age of Onset  . Arthritis Mother   . Diabetes Mother   . Heart disease Mother   . Heart attack Mother   . Pneumonia Father   . COPD Sister   . Heart disease Brother    . Pulmonary embolism Brother   . Alzheimer's disease Brother   . Alzheimer's disease Brother   . Alcoholism Other   . Arthritis Other   . Hypertension Other   . Sudden death Other   . Migraines Daughter   . Colon cancer Neg Hx     Social History   Socioeconomic History  . Marital status: Married    Spouse name: Not on file  . Number of children: Not on file  . Years of education: Not on file  . Highest education level: Not on file  Occupational History  . Not on file  Tobacco Use  . Smoking status: Never Smoker  . Smokeless tobacco: Never Used  Vaping Use  . Vaping Use: Never used  Substance and Sexual Activity  . Alcohol use: Yes    Alcohol/week: 2.0 standard drinks    Types: 2 Glasses of wine per week  . Drug use: No  . Sexual activity: Not on file  Other Topics Concern  . Not on file  Social History Narrative   Work or School: Product manager Situation: lives with husband, husband sees Dr. Elease Hashimoto      Spiritual Beliefs:       Lifestyle:  no regular exercise            Social Determinants of Health   Financial Resource Strain: Not on file  Food Insecurity: Not on file  Transportation Needs: Not on file  Physical Activity: Not on file  Stress: Not on file  Social Connections: Not on file  Intimate Partner Violence: Not on file    Outpatient Medications Prior to Visit  Medication Sig Dispense Refill  . ALPRAZolam (XANAX) 0.5 MG tablet Take 0.5 mg by mouth as needed for anxiety.     Marland Kitchen aspirin 81 MG tablet Take 81 mg by mouth daily.    Marland Kitchen donepezil (ARICEPT) 5 MG tablet TAKE 1 TABLET BY MOUTH EVERYDAY AT BEDTIME 90 tablet 1  . Multiple Vitamins-Minerals (MULTIVITAMIN WITH MINERALS) tablet Take 1 tablet by mouth daily.    Marland Kitchen amphetamine-dextroamphetamine (ADDERALL XR) 20 MG 24 hr capsule Take 20 mg by mouth daily.  (Patient not taking: Reported on 05/12/2020)    . diclofenac sodium (VOLTAREN) 1 % GEL Apply 3 gm to 3 large joints up to 3 times a  day.Dispense 3 tubes with 3 refills. (Patient not taking: No sig reported) 3 Tube 1  . HYDROcodone-acetaminophen (NORCO/VICODIN) 5-325 MG tablet Take 1 tablet by mouth every 6 (six) hours as needed. 10 tablet 0   No facility-administered medications prior to visit.    Allergies  Allergen Reactions  . Codeine     Per patient made her "flip out"  . Penicillins Hives  . Sulfa Antibiotics Hives    ROS Review of Systems  Constitutional: Negative for appetite change, chills, fever and unexpected weight change.  Respiratory: Negative for shortness of breath.   Cardiovascular: Negative for chest pain, palpitations and leg swelling.  Genitourinary: Negative for dysuria.  Neurological: Negative for dizziness, seizures, weakness and headaches.      Objective:    Physical Exam Vitals reviewed.  Constitutional:      Appearance: Normal appearance.  Cardiovascular:     Rate and Rhythm: Normal rate and regular rhythm.  Pulmonary:     Effort: Pulmonary effort is normal.     Breath sounds: Normal breath sounds.  Musculoskeletal:     Cervical back: Neck supple.     Right lower leg: No edema.     Left lower leg: No edema.  Neurological:     Mental Status: She is alert.     Comments: Normal sensory function throughout both legs.  She has full strength with plantarflexion and dorsiflexion bilaterally.  Deep tendon reflexes are 2+ knee bilaterally and 2+ ankle bilaterally.  Gait is normal     BP 112/70 (BP Location: Left Arm, Patient Position: Sitting, Cuff Size: Small)   Pulse 74   Temp 98.2 F (36.8 C) (Oral)   Ht 5\' 4"  (1.626 m)   Wt 143 lb 4.8 oz (65 kg)   SpO2 98%   BMI 24.60 kg/m  Wt Readings from Last 3 Encounters:  05/12/20 143 lb 4.8 oz (65 kg)  02/02/20 140 lb (63.5 kg)  12/11/19 140 lb (63.5 kg)     Health Maintenance Due  Topic Date Due  . Hepatitis C Screening  Never done    There are no preventive care reminders to display for this patient.  Lab Results   Component Value Date   TSH 4.16 12/11/2019   Lab Results  Component Value Date   WBC 3.3 (L) 12/11/2019   HGB 12.1 12/11/2019   HCT 36.7 12/11/2019   MCV 89.3 12/11/2019  PLT 297 12/11/2019   Lab Results  Component Value Date   NA 137 12/11/2019   K 4.3 12/11/2019   CO2 24 12/11/2019   GLUCOSE 109 (H) 12/11/2019   BUN 13 12/11/2019   CREATININE 0.84 12/11/2019   BILITOT 0.4 12/11/2019   ALKPHOS 74 11/05/2014   AST 22 12/11/2019   ALT 15 12/11/2019   PROT 7.3 12/11/2019   ALBUMIN 4.2 11/05/2014   CALCIUM 9.5 12/11/2019   ANIONGAP 14 02/12/2014   GFR 59.21 (L) 10/18/2018   Lab Results  Component Value Date   CHOL 190 12/11/2019   Lab Results  Component Value Date   HDL 33 (L) 12/11/2019   Lab Results  Component Value Date   LDLCALC 129 (H) 12/11/2019   Lab Results  Component Value Date   TRIG 166 (H) 12/11/2019   Lab Results  Component Value Date   CHOLHDL 5.8 (H) 12/11/2019   Lab Results  Component Value Date   HGBA1C 6.0 (H) 12/11/2019      Assessment & Plan:   Problem List Items Addressed This Visit   None   Visit Diagnoses    Idiopathic progressive neuropathy    -  Primary   Relevant Orders   Vitamin B12   Hemoglobin A1c   Electrophoresis, Protein and Immunofixation    Patient relates several month history of somewhat progressive bilateral sensory neuropathy symptoms.  No focal weakness.  Etiology unclear.  No past history of diabetes.  Previous B12 levels been normal.  No family history of neuropathy.  No alcohol use.  -Check labs above including repeat B12, repeat A1c, electrophoresis.  She had recent TSH a few months ago that was normal. -We discussed possible treatments including gabapentin or Lyrica but at this point will check labs first. -We have offered neurology evaluation but will wait and get labs back first  No orders of the defined types were placed in this encounter.   Follow-up: No follow-ups on file.    Carolann Littler, MD

## 2020-05-12 NOTE — Addendum Note (Signed)
Addended by: Tessie Fass D on: 05/12/2020 02:58 PM   Modules accepted: Orders

## 2020-05-12 NOTE — Patient Instructions (Signed)
Neuropathic Pain Neuropathic pain is pain caused by damage to the nerves that are responsible for certain sensations in your body (sensory nerves). The pain can be caused by:  Damage to the sensory nerves that send signals to your spinal cord and brain (peripheral nervous system).  Damage to the sensory nerves in your brain or spinal cord (central nervous system). Neuropathic pain can make you more sensitive to pain. Even a minor sensation can feel very painful. This is usually a long-term condition that can be difficult to treat. The type of pain differs from person to person. It may:  Start suddenly (acute), or it may develop slowly and last for a long time (chronic).  Come and go as damaged nerves heal, or it may stay at the same level for years.  Cause emotional distress, loss of sleep, and a lower quality of life. What are the causes? The most common cause of this condition is diabetes. Many other diseases and conditions can also cause neuropathic pain. Causes of neuropathic pain can be classified as:  Toxic. This is caused by medicines and chemicals. The most common cause of toxic neuropathic pain is damage from cancer treatments (chemotherapy).  Metabolic. This can be caused by: ? Diabetes. This is the most common disease that damages the nerves. ? Lack of vitamin B from long-term alcohol abuse.  Traumatic. Any injury that cuts, crushes, or stretches a nerve can cause damage and pain. A common example is feeling pain after losing an arm or leg (phantom limb pain).  Compression-related. If a sensory nerve gets trapped or compressed for a long period of time, the blood supply to the nerve can be cut off.  Vascular. Many blood vessel diseases can cause neuropathic pain by decreasing blood supply and oxygen to nerves.  Autoimmune. This type of pain results from diseases in which the body's defense system (immune system) mistakenly attacks sensory nerves. Examples of autoimmune diseases  that can cause neuropathic pain include lupus and multiple sclerosis.  Infectious. Many types of viral infections can damage sensory nerves and cause pain. Shingles infection is a common cause of this type of pain.  Inherited. Neuropathic pain can be a symptom of many diseases that are passed down through families (genetic). What increases the risk? You are more likely to develop this condition if:  You have diabetes.  You smoke.  You drink too much alcohol.  You are taking certain medicines, including medicines that kill cancer cells (chemotherapy) or that treat immune system disorders. What are the signs or symptoms? The main symptom is pain. Neuropathic pain is often described as:  Burning.  Shock-like.  Stinging.  Hot or cold.  Itching. How is this diagnosed? No single test can diagnose neuropathic pain. It is diagnosed based on:  Physical exam and your symptoms. Your health care provider will ask you about your pain. You may be asked to use a pain scale to describe how bad your pain is.  Tests. These may be done to see if you have a high sensitivity to pain and to help find the cause and location of any sensory nerve damage. They include: ? Nerve conduction studies to test how well nerve signals travel through your sensory nerves (electrodiagnostic testing). ? Stimulating your sensory nerves through electrodes on your skin and measuring the response in your spinal cord and brain (somatosensory evoked potential).  Imaging studies, such as: ? X-rays. ? CT scan. ? MRI. How is this treated? Treatment for neuropathic pain may change   over time. You may need to try different treatment options or a combination of treatments. Some options include:  Treating the underlying cause of the neuropathy, such as diabetes, kidney disease, or vitamin deficiencies.  Stopping medicines that can cause neuropathy, such as chemotherapy.  Medicine to relieve pain. Medicines may  include: ? Prescription or over-the-counter pain medicine. ? Anti-seizure medicine. ? Antidepressant medicines. ? Pain-relieving patches that are applied to painful areas of skin. ? A medicine to numb the area (local anesthetic), which can be injected as a nerve block.  Transcutaneous nerve stimulation. This uses electrical currents to block painful nerve signals. The treatment is painless.  Alternative treatments, such as: ? Acupuncture. ? Meditation. ? Massage. ? Physical therapy. ? Pain management programs. ? Counseling. Follow these instructions at home: Medicines  Take over-the-counter and prescription medicines only as told by your health care provider.  Do not drive or use heavy machinery while taking prescription pain medicine.  If you are taking prescription pain medicine, take actions to prevent or treat constipation. Your health care provider may recommend that you: ? Drink enough fluid to keep your urine pale yellow. ? Eat foods that are high in fiber, such as fresh fruits and vegetables, whole grains, and beans. ? Limit foods that are high in fat and processed sugars, such as fried or sweet foods. ? Take an over-the-counter or prescription medicine for constipation.   Lifestyle  Have a good support system at home.  Consider joining a chronic pain support group.  Do not use any products that contain nicotine or tobacco, such as cigarettes and e-cigarettes. If you need help quitting, ask your health care provider.  Do not drink alcohol.   General instructions  Learn as much as you can about your condition.  Work closely with all your health care providers to find the treatment plan that works best for you.  Ask your health care provider what activities are safe for you.  Keep all follow-up visits as told by your health care provider. This is important. Contact a health care provider if:  Your pain treatments are not working.  You are having side effects  from your medicines.  You are struggling with tiredness (fatigue), mood changes, depression, or anxiety. Summary  Neuropathic pain is pain caused by damage to the nerves that are responsible for certain sensations in your body (sensory nerves).  Neuropathic pain may come and go as damaged nerves heal, or it may stay at the same level for years.  Neuropathic pain is usually a long-term condition that can be difficult to treat. Consider joining a chronic pain support group. This information is not intended to replace advice given to you by your health care provider. Make sure you discuss any questions you have with your health care provider. Document Revised: 06/15/2018 Document Reviewed: 03/11/2017 Elsevier Patient Education  2021 [employer].

## 2020-05-14 DIAGNOSIS — S82855D Nondisplaced trimalleolar fracture of left lower leg, subsequent encounter for closed fracture with routine healing: Secondary | ICD-10-CM | POA: Diagnosis not present

## 2020-05-14 LAB — PROTEIN,TOTAL AND ELECTROPHOR W/IFE
Albumin ELP: 4.1 g/dL (ref 3.8–4.8)
Alpha 1: 0.2 g/dL (ref 0.2–0.3)
Alpha 2: 0.9 g/dL (ref 0.5–0.9)
Beta 2: 0.4 g/dL (ref 0.2–0.5)
Beta Globulin: 0.4 g/dL (ref 0.4–0.6)
Gamma Globulin: 1.3 g/dL (ref 0.8–1.7)
Immunofix Electr Int: NOT DETECTED
Total Protein: 7.3 g/dL (ref 6.1–8.1)

## 2020-05-15 ENCOUNTER — Telehealth: Payer: Self-pay | Admitting: Family Medicine

## 2020-05-15 NOTE — Telephone Encounter (Signed)
Pt is returning the call to the office to get her lab results

## 2020-05-15 NOTE — Telephone Encounter (Signed)
Called discussed results with patient

## 2020-05-16 NOTE — Telephone Encounter (Signed)
Pt called the office back stated that she is waiting to speak to someone about her lab results and was advised about the below msg and she stated that she has not spoke with anyone and would like to have a call back please.

## 2020-05-16 NOTE — Telephone Encounter (Signed)
Called pt to discuss the below results she stated she would call next week to request a neurology referral if the pain persisted.

## 2020-05-20 ENCOUNTER — Encounter: Payer: Self-pay | Admitting: Neurology

## 2020-05-20 ENCOUNTER — Ambulatory Visit: Payer: Medicare HMO | Admitting: Neurology

## 2020-05-20 DIAGNOSIS — G3184 Mild cognitive impairment, so stated: Secondary | ICD-10-CM

## 2020-05-20 DIAGNOSIS — R4189 Other symptoms and signs involving cognitive functions and awareness: Secondary | ICD-10-CM | POA: Insufficient documentation

## 2020-05-20 HISTORY — DX: Mild cognitive impairment of uncertain or unknown etiology: G31.84

## 2020-05-20 MED ORDER — DONEPEZIL HCL 10 MG PO TABS: 10.0000 mg | ORAL_TABLET | Freq: Every day | ORAL | 1 refills | Status: DC

## 2020-05-20 NOTE — Patient Instructions (Signed)
We will go up on the aricept to 10 mg at night.  Begin Aricept (donepezil) at 5 mg at night for one month. If this medication is well-tolerated, please call our office and we will call in a prescription for the 10 mg tablets. Look out for side effects that may include nausea, diarrhea, weight loss, or stomach cramps. This medication will also cause a runny nose, therefore there is no need for allergy medications for this purpose.

## 2020-05-20 NOTE — Progress Notes (Signed)
Reason for visit: Memory disturbance  Referring physician: Dr. Eden Emms is a 80 y.o. female  History of present illness:  [name] is a 80 year old right-handed white female with a history of a mild memory disturbance, she was seen here previously in 2019.  Unfortunately, she was lost to follow-up.  MRI of the brain was done at that time was relatively unremarkable, blood work was unremarkable.  She was set up for formal neuropsychological testing as she had a prior history of ADD throughout her life.  She never underwent the testing and was lost to follow-up.  She returns at this point with ongoing issues with memory, she comes in with her husband today.  She indicates that she has 2 brothers and 2 sisters who are suffering from Alzheimer's disease.  The patient has been placed on Aricept taking 5 mg at night, she claimed that she has been on this for about a month.  She tolerates the drug well.  She continues to have some short-term memory issues, her husband indicates that she repeats herself frequently.  She is operating a motor vehicle but occasionally will have some mild difficulty with directions.  She has difficulty with losing lists that she has made to remind herself, and sometimes she will get mixed up with her medications.  She claims that she is sleeping well but her husband says that she oftentimes will have to take a nap during the day.  She denies any weakness or numbness of the extremities, she denies any significant balance issues.  She did fall in November 2021 and sustained a left orbital fracture.  CT of the head otherwise was unremarkable at that time.  The patient returns to this office for further evaluation.  Past Medical History:  Diagnosis Date  . Anxiety state, unspecified - sees Dr. Nori Riis in gyn and benzo prescribed there 04/09/2013  . Arthritis   . Chicken pox   . Lupus ([place])   . Migraines   . Prediabetes     Past Surgical History:  Procedure  Laterality Date  . BACK SURGERY  2000  . CHOLECYSTECTOMY    . DILATION AND CURETTAGE OF UTERUS  1973  . GALLBLADDER SURGERY  1974    Family History  Problem Relation Age of Onset  . Arthritis Mother   . Diabetes Mother   . Heart disease Mother   . Heart attack Mother   . Pneumonia Father   . COPD Sister   . Heart disease Brother   . Pulmonary embolism Brother   . Alzheimer's disease Brother   . Alzheimer's disease Brother   . Alcoholism Other   . Arthritis Other   . Hypertension Other   . Sudden death Other   . Migraines Daughter   . Colon cancer Neg Hx     Social history:  reports that she has never smoked. She has never used smokeless tobacco. She reports current alcohol use of about 2.0 standard drinks of alcohol per week. She reports that she does not use drugs.  Medications:  Prior to Admission medications   Medication Sig Start Date End Date Taking? Authorizing Provider  ALPRAZolam Duanne Moron) 0.5 MG tablet Take 0.5 mg by mouth as needed for anxiety.    Yes Maisie Fus, MD  aspirin 81 MG tablet Take 81 mg by mouth daily.   Yes [provider]  diclofenac sodium (VOLTAREN) 1 % GEL Apply 3 gm to 3 large joints up to 3 times  a day.Dispense 3 tubes with 3 refills. 02/18/17  Yes Deveshwar, Abel Presto, MD  donepezil (ARICEPT) 5 MG tablet TAKE 1 TABLET BY MOUTH EVERYDAY AT BEDTIME 04/02/20  Yes Burchette, Alinda Sierras, MD  Multiple Vitamins-Minerals (MULTIVITAMIN WITH MINERALS) tablet Take 1 tablet by mouth daily.   Yes [provider]  amphetamine-dextroamphetamine (ADDERALL XR) 20 MG 24 hr capsule Take 20 mg by mouth daily.    [provider]      Allergies  Allergen Reactions  . Codeine     Per patient made her "flip out"  . Penicillins Hives  . Sulfa Antibiotics Hives    ROS:  Out of a complete 14 system review of symptoms, the patient complains only of the following symptoms, and all other reviewed systems are negative.  Memory problems Left  ankle discomfort following fracture  Blood pressure 140/66, pulse 70, height 5\' 4"  (1.626 m), weight 145 lb 9.6 oz (66 kg).  Physical Exam  General: The patient is alert and cooperative at the time of the examination.  Eyes: Pupils are equal, round, and reactive to light. Discs are flat bilaterally.  Neck: The neck is supple, no carotid bruits are noted.  Respiratory: The respiratory examination is clear.  Cardiovascular: The cardiovascular examination reveals a regular rate and rhythm, no obvious murmurs or rubs are noted.  Skin: Extremities are without significant edema.  Neurologic Exam  Mental status: The patient is alert and oriented x 3 at the time of the examination. The Mini-Mental status examination done today shows a total score 25/30.  Cranial nerves: Facial symmetry is present. There is good sensation of the face to pinprick and soft touch bilaterally. The strength of the facial muscles and the muscles to head turning and shoulder shrug are normal bilaterally. Speech is well enunciated, no aphasia or dysarthria is noted. Extraocular movements are full. Visual fields are full. The tongue is midline, and the patient has symmetric elevation of the soft palate. No obvious hearing deficits are noted.  Motor: The motor testing reveals 5 over 5 strength of all 4 extremities. Good symmetric motor tone is noted throughout.  Sensory: Sensory testing is intact to pinprick, soft touch, vibration sensation, and position sense on all 4 extremities. No evidence of extinction is noted.  Coordination: Cerebellar testing reveals good finger-nose-finger and heel-to-shin bilaterally.  Gait and station: Gait is normal. Tandem gait is normal. Romberg is negative. No drift is seen.  Reflexes: Deep tendon reflexes are symmetric and normal bilaterally. Toes are downgoing bilaterally.   Assessment/Plan:  1.  Mild cognitive impairment  The patient has a very prominent family history for  Alzheimer disease and she likely is developing a dementia.  The patient is on Aricept which is appropriate at this point, we will go up on the dose to 10 mg at night.  She will follow up here in 6 months.  The driving issue will need to be scrutinized closely.  She will call our office if she wishes to have a speech therapy evaluation for cognitive training.  Jill Alexanders MD 05/20/2020 8:53 AM  [facility] [address] [place],  [address]  Phone [phone] Fax [phone]

## 2020-05-22 ENCOUNTER — Telehealth: Payer: Self-pay | Admitting: Neurology

## 2020-05-22 NOTE — Telephone Encounter (Signed)
Left voice mail for patient to return call for additional information.

## 2020-05-22 NOTE — Telephone Encounter (Signed)
Pt called wanting to know if provider can start prescribing her the Adderall she used to take. Please advise.

## 2020-05-23 ENCOUNTER — Telehealth: Payer: Self-pay | Admitting: Family Medicine

## 2020-05-23 NOTE — Telephone Encounter (Signed)
I spoke with pt's husband. They will call and make the appointment with Dr. Jannifer Franklin.

## 2020-05-23 NOTE — Telephone Encounter (Signed)
pts spouse is calling in stating that the pt has had her labs done and now she is ready for a referral to Dr. Posey Pronto for her leg pain.  Pts spouse would like to have a call back.

## 2020-05-23 NOTE — Telephone Encounter (Signed)
She is already seeing Dr Jannifer Franklin (neurologist) and would discuss with him first   Her symptoms do sound neuropathic and agree with seeing neurology, but would not get both groups simultaneously involved (ie [facility])

## 2020-05-26 NOTE — Telephone Encounter (Signed)
Reached patient by phone, stated she was prescribed adderall around 2 years ago and took it for about 6 months.  Was prescribed by another provider.    Is calling PCP to discuss prescribing Adderall.  Stated will call back if she is unable to get it filled through PCP.

## 2020-05-26 NOTE — Telephone Encounter (Signed)
Patient stated she had a pain in the top of her head, she couldn't go to sleep, she was awake for a couple of hours.  Took some advil and was able to sleep in a recliner, she feels it was because she was elevated.   She thought it could be related to her nerves.  She is aware Dr. Jannifer Franklin is out of the office this week and is willing to wait for his return for a response.

## 2020-05-27 MED ORDER — AMPHETAMINE-DEXTROAMPHETAMINE 10 MG PO TABS: 10.0000 mg | ORAL_TABLET | Freq: Every day | ORAL | 0 refills | Status: DC

## 2020-05-27 NOTE — Addendum Note (Signed)
Addended by: Kathrynn Ducking on: 05/27/2020 11:03 AM   Modules accepted: Orders

## 2020-05-27 NOTE — Telephone Encounter (Signed)
I called the patient.  The patient had been on Adderall in the past for ADD, I will send in a small prescription for her for 10 mg Adderall.  She may have both problems with focusing and with memory.  Since going up on the Aricept, she has had several nights with headache on the top of the head, she will wake up in the middle the night with this.   I will asked her to take the Aricept in the morning rather than the evening to see if this alleviates the headache issue.

## 2020-05-28 DIAGNOSIS — L708 Other acne: Secondary | ICD-10-CM | POA: Diagnosis not present

## 2020-07-15 DIAGNOSIS — Z8249 Family history of ischemic heart disease and other diseases of the circulatory system: Secondary | ICD-10-CM | POA: Diagnosis not present

## 2020-07-15 DIAGNOSIS — R69 Illness, unspecified: Secondary | ICD-10-CM | POA: Diagnosis not present

## 2020-07-15 DIAGNOSIS — Z87891 Personal history of nicotine dependence: Secondary | ICD-10-CM | POA: Diagnosis not present

## 2020-07-15 DIAGNOSIS — Z833 Family history of diabetes mellitus: Secondary | ICD-10-CM | POA: Diagnosis not present

## 2020-07-15 DIAGNOSIS — R030 Elevated blood-pressure reading, without diagnosis of hypertension: Secondary | ICD-10-CM | POA: Diagnosis not present

## 2020-07-15 DIAGNOSIS — F909 Attention-deficit hyperactivity disorder, unspecified type: Secondary | ICD-10-CM | POA: Diagnosis not present

## 2020-07-21 ENCOUNTER — Telehealth: Payer: Self-pay | Admitting: Neurology

## 2020-07-21 DIAGNOSIS — G40209 Localization-related (focal) (partial) symptomatic epilepsy and epileptic syndromes with complex partial seizures, not intractable, without status epilepticus: Secondary | ICD-10-CM

## 2020-07-21 NOTE — Telephone Encounter (Signed)
I called the patient.  Within the last 2 to 3 weeks, she has begun having very frequent episodes of dj vu.  She started Aricept in mid March 2022.  Not sure if this may be a medication side effect of the Aricept.  The patient is having several episodes a week at this point.  I will check an EEG study.  The patient denies any other alteration of consciousness or loss time.

## 2020-07-21 NOTE — Telephone Encounter (Signed)
Pt called and LVM needing to speak to the RN regarding symptoms she has been feeling. Pt states she has been feeling pain in her head that she has not had before and it mainly happens in the evening, she also states that she has been having dejavu feelings. She will think she has had a conversation with someone but she really hasn't. Please advise.

## 2020-07-21 NOTE — Telephone Encounter (Signed)
The patient called back indicating that she is having some issues with leg discomfort as well that has been going on for several months.  This is different from what we had initially evaluated her for, I did not focus on this issue when I saw her for her memory problems.  I will try to get a revisit to see if I can address this issue.

## 2020-07-22 NOTE — Telephone Encounter (Signed)
Called patient and offered 08/27/20 @ 0730 with arrival time of 715.  Patient agreed to appointment.    Patient denied further questions, verbalized understanding and expressed appreciation for the phone call.

## 2020-08-05 ENCOUNTER — Ambulatory Visit: Payer: Medicare HMO | Admitting: Neurology

## 2020-08-05 ENCOUNTER — Telehealth: Payer: Self-pay | Admitting: Neurology

## 2020-08-05 DIAGNOSIS — G40209 Localization-related (focal) (partial) symptomatic epilepsy and epileptic syndromes with complex partial seizures, not intractable, without status epilepticus: Secondary | ICD-10-CM | POA: Diagnosis not present

## 2020-08-05 NOTE — Procedures (Signed)
    History:  Tara Martin is a 80 year old patient with a history of a memory disorder.  The patient's had frequent events of dj vu, she is being evaluated for the possibility of subclinical seizures.  This is a routine EEG.  No skull defects are noted.  Medications include Xanax, aspirin, Adderall, donepezil, and multivitamins.  EEG classification: Normal awake  Description of the recording: The background rhythms of this recording consists of a fairly well modulated medium amplitude alpha rhythm of 8 Hz that is reactive to eye opening and closure. As the record progresses, the patient appears to remain in the waking state throughout the recording. Photic stimulation was performed, resulting in a bilateral and symmetric photic driving response. Hyperventilation was also performed, resulting in a minimal buildup of the background rhythm activities without significant slowing seen. At no time during the recording does there appear to be evidence of spike or spike wave discharges or evidence of focal slowing. EKG monitor shows no evidence of cardiac rhythm abnormalities with a heart rate of 66.  Impression: This is a normal EEG recording in the waking state. No evidence of ictal or interictal discharges are seen.

## 2020-08-05 NOTE — Telephone Encounter (Signed)
I called the patient.  The EEG study was unremarkable.

## 2020-08-27 ENCOUNTER — Encounter: Payer: Self-pay | Admitting: Neurology

## 2020-08-27 ENCOUNTER — Ambulatory Visit (INDEPENDENT_AMBULATORY_CARE_PROVIDER_SITE_OTHER): Payer: Medicare HMO | Admitting: Neurology

## 2020-08-27 VITALS — BP 121/72 | HR 62 | Ht 64.0 in | Wt 142.0 lb

## 2020-08-27 DIAGNOSIS — G3184 Mild cognitive impairment, so stated: Secondary | ICD-10-CM | POA: Diagnosis not present

## 2020-08-27 MED ORDER — AMPHETAMINE-DEXTROAMPHETAMINE 10 MG PO TABS
10.0000 mg | ORAL_TABLET | Freq: Every day | ORAL | 0 refills | Status: DC
Start: 2020-08-27 — End: 2022-08-09

## 2020-08-27 MED ORDER — DONEPEZIL HCL 10 MG PO TABS
10.0000 mg | ORAL_TABLET | Freq: Every day | ORAL | 1 refills | Status: DC
Start: 2020-08-27 — End: 2021-10-05

## 2020-08-27 NOTE — Progress Notes (Signed)
Reason for visit: Mild cognitive impairment, leg pain  Tara Martin is an 80 y.o. female  History of present illness:  Tara Martin is a 80 year old right-handed white female with a history of a mild memory disturbance.  The patient has a very prominent family history of Alzheimer's disease, she has 8 siblings and 24 of them suffer from Alzheimer's disease.  The patient has had episodes of dj vu, an EEG study was unremarkable.  The patient is on Aricept taking 10 mg at night.  She takes low-dose Adderall for ADD issues.  The patient returns to the office today for evaluation of a new problem.  She indicates that over the last 2 years she has had problems with tibial discomfort from the knee to the ankle that is primarily on the right leg but may affect the left leg slightly.  There is no activating factor in the pain, she may have discomfort sitting or with walking.  The pain may last all day long.  Surprisingly, she has had no pain essentially over the last week prior to this evaluation.  She denies any low back pain or foot discomfort.  She denies numbness of the lower extremities or hands.  She denies any neck pain or arm pain.  She has not had any weakness.  She reports that the pain itself is a burning sensation and that massage therapy may improve the discomfort.  She feels better when she is up walking.  Walking does not initiate the pain.  She denies issues controlling the bowels or the bladder.  Tylenol or Advil may help.  She does have a prior history of a left ankle fracture.  The patient denies any overt muscle cramps in the legs.  Past Medical History:  Diagnosis Date   Anxiety state, unspecified - sees Dr. Nori Riis in gyn and benzo prescribed there 04/09/2013   Arthritis    Chicken pox    Lupus ([place])    MCI (mild cognitive impairment) 05/20/2020   Migraines    Prediabetes     Past Surgical History:  Procedure Laterality Date   BACK SURGERY  2000   CHOLECYSTECTOMY     DILATION  AND CURETTAGE OF UTERUS  1973   GALLBLADDER SURGERY  1974    Family History  Problem Relation Age of Onset   Arthritis Mother    Diabetes Mother    Heart disease Mother    Heart attack Mother    Pneumonia Father    COPD Sister    Heart disease Brother    Pulmonary embolism Brother    Alzheimer's disease Brother    Alzheimer's disease Brother    Alcoholism Other    Arthritis Other    Hypertension Other    Sudden death Other    Migraines Daughter    Colon cancer Neg Hx     Social history:  reports that she has never smoked. She has never used smokeless tobacco. She reports current alcohol use of about 2.0 standard drinks of alcohol per week. She reports that she does not use drugs.    Allergies  Allergen Reactions   Codeine     Per patient made her "flip out"   Penicillins Hives   Sulfa Antibiotics Hives    Medications:  Prior to Admission medications   Medication Sig Start Date End Date Taking? Authorizing Provider  ALPRAZolam Duanne Moron) 0.5 MG tablet Take 0.5 mg by mouth as needed for anxiety.    Yes Maisie Fus, MD  amphetamine-dextroamphetamine (ADDERALL) 10 MG tablet Take 1 tablet (10 mg total) by mouth daily with breakfast. 05/27/20  Yes Kathrynn Ducking, MD  aspirin 81 MG tablet Take 81 mg by mouth daily.   Yes [provider]  diclofenac sodium (VOLTAREN) 1 % GEL Apply 3 gm to 3 large joints up to 3 times a day.Dispense 3 tubes with 3 refills. 02/18/17  Yes Deveshwar, Abel Presto, MD  donepezil (ARICEPT) 10 MG tablet Take 1 tablet (10 mg total) by mouth at bedtime. 05/20/20  Yes Kathrynn Ducking, MD  Multiple Vitamins-Minerals (MULTIVITAMIN WITH MINERALS) tablet Take 1 tablet by mouth daily.   Yes [provider]    ROS:  Out of a complete 14 system review of symptoms, the patient complains only of the following symptoms, and all other reviewed systems are negative.  Leg pain Episodic dj vu Head pain Memory problem  Blood pressure 120/80,  height 5\' 4"  (1.626 m), weight 142 lb (64.4 kg).  Physical Exam  General: The patient is alert and cooperative at the time of the examination.  Neuromuscular: The patient has excellent range of movement of the low back.  Skin: No significant peripheral edema is noted.   Neurologic Exam  Mental status: The patient is alert and oriented x 3 at the time of the examination. The patient has apparent normal recent and remote memory, with an apparently normal attention span and concentration ability.   Cranial nerves: Facial symmetry is present. Speech is normal, no aphasia or dysarthria is noted. Extraocular movements are full. Visual fields are full.  Motor: The patient has good strength in all 4 extremities.  Sensory examination: Soft touch sensation is symmetric on the face, arms, and legs.  Coordination: The patient has good finger-nose-finger and heel-to-shin bilaterally.  Gait and station: The patient has a normal gait. Tandem gait is normal. Romberg is negative. No drift is seen.  The patient is able to walk on the heels and the toes bilaterally.  Reflexes: Deep tendon reflexes are symmetric, with exception of absence of the left ankle jerk reflex, trace reflex on the right ankle.   EEG 08/05/20:  Impression: This is a normal EEG recording in the waking state. No evidence of ictal or interictal discharges are seen.     Assessment/Plan:  1.  Leg discomfort, etiology unclear  2.  Mild cognitive impairment  3.  Episodic dj vu  The patient will remain on Aricept for now.  She reports some occasional sharp brief head pains that seem to simulate ice pick pains, they will migrate about the head.  The patient denies a history of migraine.  The patient has a very prominent family history of Alzheimer's disease, this clearly increases her risk for similar issues.  The patient reports leg pain that he has not clearly associated with activity, and appears to be in a tibial  distribution.  The patient has had no pain over the last week or so, we will follow the patient conservatively, if the pain returns she will call our office and we will set her up for EMG and nerve conduction study evaluation.  She may treat the pain if it returns with ibuprofen, taking 600 mg 3 times daily for 7 to 10 days.  She will follow-up in 6 months for evaluation of the memory issue.  A prescription for Adderall was sent in.  Jill Alexanders MD 08/27/2020 7:12 AM  [facility] [address] [place], [place] [address]  Phone [phone]  Fax [phone]

## 2020-09-01 DIAGNOSIS — Z4689 Encounter for fitting and adjustment of other specified devices: Secondary | ICD-10-CM | POA: Diagnosis not present

## 2020-10-21 ENCOUNTER — Other Ambulatory Visit: Payer: Self-pay | Admitting: Family Medicine

## 2020-10-22 NOTE — Telephone Encounter (Signed)
Please advise. Last filled by Jorge Mandril MD. Ok to refill?

## 2020-11-06 DIAGNOSIS — L718 Other rosacea: Secondary | ICD-10-CM | POA: Diagnosis not present

## 2020-11-24 ENCOUNTER — Telehealth: Payer: Self-pay | Admitting: *Deleted

## 2020-11-24 NOTE — Telephone Encounter (Signed)
Pt states that she is not near her calender and will call back to make an appt for AWV

## 2020-11-25 DIAGNOSIS — Z4689 Encounter for fitting and adjustment of other specified devices: Secondary | ICD-10-CM | POA: Diagnosis not present

## 2020-12-03 DIAGNOSIS — H2511 Age-related nuclear cataract, right eye: Secondary | ICD-10-CM | POA: Diagnosis not present

## 2020-12-03 DIAGNOSIS — H25011 Cortical age-related cataract, right eye: Secondary | ICD-10-CM | POA: Diagnosis not present

## 2020-12-03 DIAGNOSIS — H5212 Myopia, left eye: Secondary | ICD-10-CM | POA: Diagnosis not present

## 2020-12-03 DIAGNOSIS — H25041 Posterior subcapsular polar age-related cataract, right eye: Secondary | ICD-10-CM | POA: Diagnosis not present

## 2021-01-20 DIAGNOSIS — H25811 Combined forms of age-related cataract, right eye: Secondary | ICD-10-CM | POA: Diagnosis not present

## 2021-01-20 DIAGNOSIS — H2511 Age-related nuclear cataract, right eye: Secondary | ICD-10-CM | POA: Diagnosis not present

## 2021-01-20 DIAGNOSIS — H2512 Age-related nuclear cataract, left eye: Secondary | ICD-10-CM | POA: Diagnosis not present

## 2021-01-20 DIAGNOSIS — H25041 Posterior subcapsular polar age-related cataract, right eye: Secondary | ICD-10-CM | POA: Diagnosis not present

## 2021-01-20 DIAGNOSIS — H25011 Cortical age-related cataract, right eye: Secondary | ICD-10-CM | POA: Diagnosis not present

## 2021-01-27 ENCOUNTER — Telehealth: Payer: Self-pay

## 2021-01-27 NOTE — Telephone Encounter (Signed)
Last OV for preventative care 12/11/19.  Pt notified of above. States she will call back to schedule an appt when she has her calendar.

## 2021-02-19 DIAGNOSIS — Z4689 Encounter for fitting and adjustment of other specified devices: Secondary | ICD-10-CM | POA: Diagnosis not present

## 2021-02-24 DIAGNOSIS — D329 Benign neoplasm of meninges, unspecified: Secondary | ICD-10-CM | POA: Diagnosis not present

## 2021-02-24 DIAGNOSIS — G9389 Other specified disorders of brain: Secondary | ICD-10-CM | POA: Diagnosis not present

## 2021-02-24 DIAGNOSIS — R413 Other amnesia: Secondary | ICD-10-CM | POA: Diagnosis not present

## 2021-02-25 NOTE — Progress Notes (Signed)
PATIENT: Tara [place] DOB: Jan 19, 1941  REASON FOR VISIT: Follow up for memory  HISTORY FROM: Patient PRIMARY NEUROLOGIST: Dr. Leta Baptist since Dr. Jannifer Franklin has retired   HISTORY OF PRESENT ILLNESS: Today 02/26/21 [name] here today for follow-up for history of mild cognitive impairment with strong family history of Alzheimer's disease.  Last seen in June 2022 by Dr. Jannifer Franklin for bilateral tibial discomfort.  Is on Aricept. Episodes of dj vu, EEG has been unremarkable.  Was originally seen in 2019 for memory disturbance, was set up for neuropsychological evaluation, never underwent the testing and was lost to follow-up.  Has lifelong history of ADD. Is on Adderall as needed. Today, thinks memory is doing well, she makes notes. Has no concerns about this, is just concerned about her family history. Lives with her husband, drives, manages her household well. Her family is coming into town for Christmas from [place], she is mentioning some baking she needs to do. Switched down to Aricept 5 mg from 10 2 weeks ago due to trouble sleeping, but otherwise tolerated 10 mg okay. Claims had MRI of brain earlier this week ordered by Dr. Arnoldo Morale, she doesn't know why or the results? Still having episodes of dj vu, started around May 2022, few weeks after Aricept. Watching TV, "I've seen this before" or talking "I've heard this before".  Here today alone.  HISTORY 08/27/2020 Dr. Jannifer Franklin: Tara Martin is a 80 year old right-handed white female with a history of a mild memory disturbance.  The patient has a very prominent family history of Alzheimer's disease, she has 8 siblings and 33 of them suffer from Alzheimer's disease.  The patient has had episodes of dj vu, an EEG study was unremarkable.  The patient is on Aricept taking 10 mg at night.  She takes low-dose Adderall for ADD issues.  The patient returns to the office today for evaluation of a new problem.  She indicates that over the last 2 years she  has had problems with tibial discomfort from the knee to the ankle that is primarily on the right leg but may affect the left leg slightly.  There is no activating factor in the pain, she may have discomfort sitting or with walking.  The pain may last all day long.  Surprisingly, she has had no pain essentially over the last week prior to this evaluation.  She denies any low back pain or foot discomfort.  She denies numbness of the lower extremities or hands.  She denies any neck pain or arm pain.  She has not had any weakness.  She reports that the pain itself is a burning sensation and that massage therapy may improve the discomfort.  She feels better when she is up walking.  Walking does not initiate the pain.  She denies issues controlling the bowels or the bladder.  Tylenol or Advil may help.  She does have a prior history of a left ankle fracture.  The patient denies any overt muscle cramps in the legs.  REVIEW OF SYSTEMS: Out of a complete 14 system review of symptoms, the patient complains only of the following symptoms, and all other reviewed systems are negative.  See HPI  ALLERGIES: Allergies  Allergen Reactions   Codeine     Per patient made her "flip out"   Penicillins Hives   Sulfa Antibiotics Hives    HOME MEDICATIONS: Outpatient Medications Prior to Visit  Medication Sig Dispense Refill   ALPRAZolam (XANAX) 0.5 MG tablet Take 0.5 mg by  mouth as needed for anxiety.      amphetamine-dextroamphetamine (ADDERALL) 10 MG tablet Take 1 tablet (10 mg total) by mouth daily with breakfast. 30 tablet 0   aspirin 81 MG tablet Take 81 mg by mouth daily.     diclofenac sodium (VOLTAREN) 1 % GEL Apply 3 gm to 3 large joints up to 3 times a day.Dispense 3 tubes with 3 refills. 3 Tube 1   donepezil (ARICEPT) 10 MG tablet Take 1 tablet (10 mg total) by mouth at bedtime. 90 tablet 1   Multiple Vitamins-Minerals (MULTIVITAMIN WITH MINERALS) tablet Take 1 tablet by mouth daily.     No  facility-administered medications prior to visit.    PAST MEDICAL HISTORY: Past Medical History:  Diagnosis Date   Anxiety state, unspecified - sees Dr. Nori Riis in gyn and benzo prescribed there 04/09/2013   Arthritis    Chicken pox    Lupus ([place])    MCI (mild cognitive impairment) 05/20/2020   Migraines    Prediabetes     PAST SURGICAL HISTORY: Past Surgical History:  Procedure Laterality Date   BACK SURGERY  2000   CHOLECYSTECTOMY     DILATION AND CURETTAGE OF UTERUS  1973   GALLBLADDER SURGERY  1974    FAMILY HISTORY: Family History  Problem Relation Age of Onset   Arthritis Mother    Diabetes Mother    Heart disease Mother    Heart attack Mother    Pneumonia Father    COPD Sister    Heart disease Brother    Pulmonary embolism Brother    Alzheimer's disease Brother    Alzheimer's disease Brother    Alcoholism Other    Arthritis Other    Hypertension Other    Sudden death Other    Migraines Daughter    Colon cancer Neg Hx     SOCIAL HISTORY: Social History   Socioeconomic History   Marital status: Married    Spouse name: chuck   Number of children: Not on file   Years of education: Not on file   Highest education level: Not on file  Occupational History   Occupation: retired  Tobacco Use   Smoking status: Never   Smokeless tobacco: Never  Vaping Use   Vaping Use: Never used  Substance and Sexual Activity   Alcohol use: Yes    Alcohol/week: 2.0 standard drinks    Types: 2 Glasses of wine per week   Drug use: No   Sexual activity: Not on file  Other Topics Concern   Not on file  Social History Narrative   Lives with husband   Right Handed   Drinks 1 cup maybe every other day   Social Determinants of Health   Financial Resource Strain: Not on file  Food Insecurity: Not on file  Transportation Needs: Not on file  Physical Activity: Not on file  Stress: Not on file  Social Connections: Not on file  Intimate Partner Violence: Not on file    PHYSICAL EXAM  Vitals:   02/26/21 0737  BP: 130/74  Pulse: 62  Weight: 140 lb (63.5 kg)  Height: 5\' 4"  (1.626 m)   Body mass index is 24.03 kg/m.  Generalized: Well developed, in no acute distress  MMSE - Mini Mental State Exam 02/26/2021 05/20/2020 02/08/2018  Not completed: - - -  Orientation to time 5 2 5   Orientation to Place 5 5 4   Registration 3 3 3   Attention/ Calculation 2 5 5   Recall 0 1  1  Language- name 2 objects 2 2 2   Language- repeat 1 1 1   Language- follow 3 step command 3 3 3   Language- read & follow direction 1 1 1   Write a sentence 1 1 1   Copy design 1 1 1   Total score 24 25 27    Neurological examination  Mentation: Alert oriented to time, place, history taking. Follows all commands speech and language fluent Cranial nerve II-XII: Pupils were equal round reactive to light. Extraocular movements were full, visual field were full on confrontational test. Facial sensation and strength were normal.  Head turning and shoulder shrug  were normal and symmetric. Motor: The motor testing reveals 5 over 5 strength of all 4 extremities. Good symmetric motor tone is noted throughout.  Sensory: Sensory testing is intact to soft touch on all 4 extremities. No evidence of extinction is noted.  Coordination: Cerebellar testing reveals good finger-nose-finger and heel-to-shin bilaterally.  Gait and station: Gait is normal, but slight limp on the left. Tandem gait is normal.  Reflexes: Deep tendon reflexes are symmetric and normal bilaterally.   DIAGNOSTIC DATA (LABS, IMAGING, TESTING) - I reviewed patient records, labs, notes, testing and imaging myself where available.  Lab Results  Component Value Date   WBC 3.3 (L) 12/11/2019   HGB 12.1 12/11/2019   HCT 36.7 12/11/2019   MCV 89.3 12/11/2019   PLT 297 12/11/2019      Component Value Date/Time   NA 137 12/11/2019 0908   K 4.3 12/11/2019 0908   CL 105 12/11/2019 0908   CO2 24 12/11/2019 0908   GLUCOSE 109 (H)  12/11/2019 0908   BUN 13 12/11/2019 0908   CREATININE 0.84 12/11/2019 0908   CALCIUM 9.5 12/11/2019 0908   PROT 7.3 05/12/2020 1457   ALBUMIN 4.2 11/05/2014 0853   AST 22 12/11/2019 0908   ALT 15 12/11/2019 0908   ALKPHOS 74 11/05/2014 0853   BILITOT 0.4 12/11/2019 0908   GFRNONAA 66 02/18/2017 1532   GFRAA 77 02/18/2017 1532   Lab Results  Component Value Date   CHOL 190 12/11/2019   HDL 33 (L) 12/11/2019   LDLCALC 129 (H) 12/11/2019   TRIG 166 (H) 12/11/2019   CHOLHDL 5.8 (H) 12/11/2019   Lab Results  Component Value Date   HGBA1C 6.1 05/12/2020   Lab Results  Component Value Date   VITAMINB12 >1506 (H) 05/12/2020   Lab Results  Component Value Date   TSH 4.16 12/11/2019   ASSESSMENT AND PLAN 80 y.o. year old female  has a past medical history of Anxiety state, unspecified - sees Dr. Nori Riis in gyn and benzo prescribed there (04/09/2013), Arthritis, Chicken pox, Lupus ([place]), MCI (mild cognitive impairment) (05/20/2020), Migraines, and Prediabetes. here with:  1.  Mild cognitive impairment 2.  Episodic dj vu -Reportedly stable, MMSE 24/30 today, was 25/30 in June -Will stop Aricept for several weeks to see if any effect on the dj vu episodes (these started a few weeks after starting Aricept earlier this year) -Claims Dr. Arnoldo Morale sent her for MRI of the brain this week, but has no recollection or why or who Dr. Arnoldo Morale is; she is going to follow up on this and let me know, results are not available in Epic -Prior EEG for evaluation of the dj vu was normal -Will consider formal neuropsychological testing in the future, given strong family history of Alzheimer's disease, lifelong history of ADD -She will call for update off the Aricept, if there is no change to the  dj vu episodes, we can restart Aricept, also consider Namenda in the future, she will follow-up with me in 6 months or sooner if needed, I have asked at next visit she bring her husband, in the future she will be  followed by Dr. Leta Baptist since Dr. Jannifer Franklin has retired  3.  Leg discomfort -Resolved   I spent 32 minutes of face-to-face and non-face-to-face time with patient.  This included previsit chart review, lab review, study review, order entry, electronic health record documentation, discussing Aricept, spells, and follow up.   Butler Denmark, AGNP-C, DNP 02/26/2021, 7:53 AM [facility] [address], [place],  [address] [phone]

## 2021-02-26 ENCOUNTER — Ambulatory Visit: Payer: Medicare HMO | Admitting: Neurology

## 2021-02-26 ENCOUNTER — Encounter: Payer: Self-pay | Admitting: Neurology

## 2021-02-26 ENCOUNTER — Other Ambulatory Visit: Payer: Self-pay

## 2021-02-26 VITALS — BP 130/74 | HR 62 | Ht 64.0 in | Wt 140.0 lb

## 2021-02-26 DIAGNOSIS — G3184 Mild cognitive impairment, so stated: Secondary | ICD-10-CM

## 2021-02-26 NOTE — Patient Instructions (Addendum)
Stop the Aricept for several weeks to see how the deju vu spells respond Let me know about the MRI results, so I can view them Call with updates, will consider neuropsychological testing in future Next time you come please bring your husband See you back in 6 months

## 2021-03-04 IMAGING — MG DIGITAL DIAGNOSTIC BILAT W/ TOMO W/ CAD
8 series · 8 of 24 positions shown · non-contrast
Comparison: Previous exam(s).

CLINICAL DATA: 78-year-old patient presents for annual mammogram
evaluation of the left breast. Recently she has had an intermittent
thumping sensation in the upper left breast or chest. This has not
happened in over a week. She states that she had an EKG evaluation
that was normal. There is also a possible density palpated in the 2
o'clock axis of the left breast on physical exam.

EXAM:
DIGITAL DIAGNOSTIC BILATERAL MAMMOGRAM WITH CAD AND TOMO
ULTRASOUND LEFT BREAST

[R MLO synth-2D]
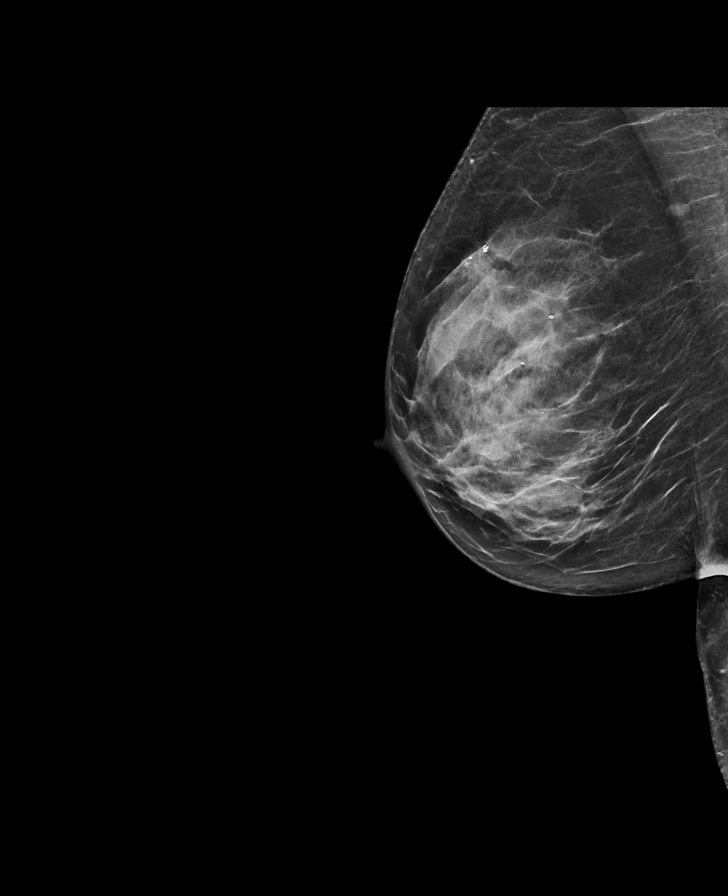

[L MLO synth-2D]
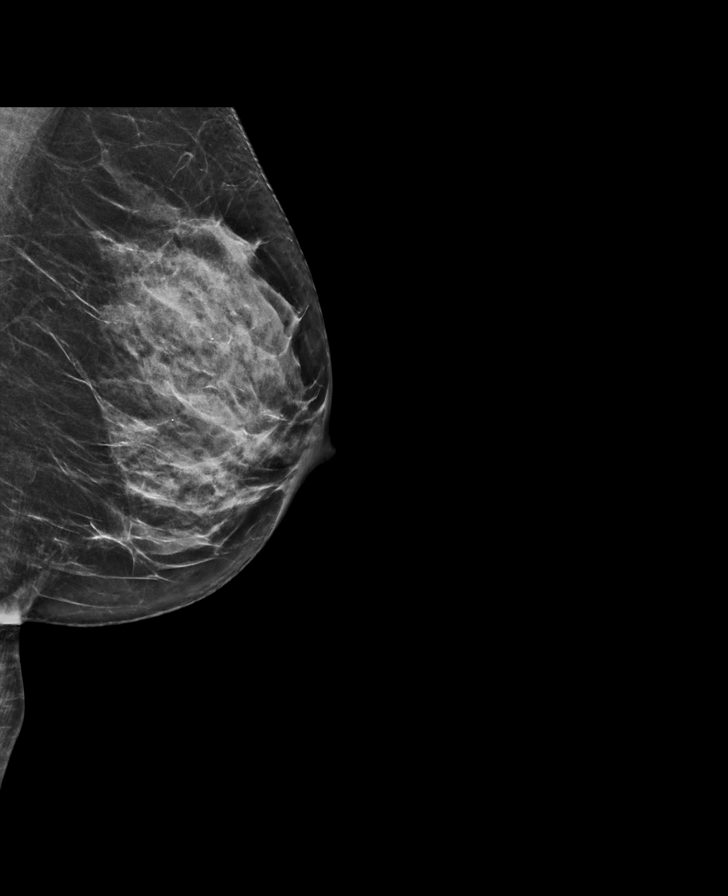

[L CC synth-2D]
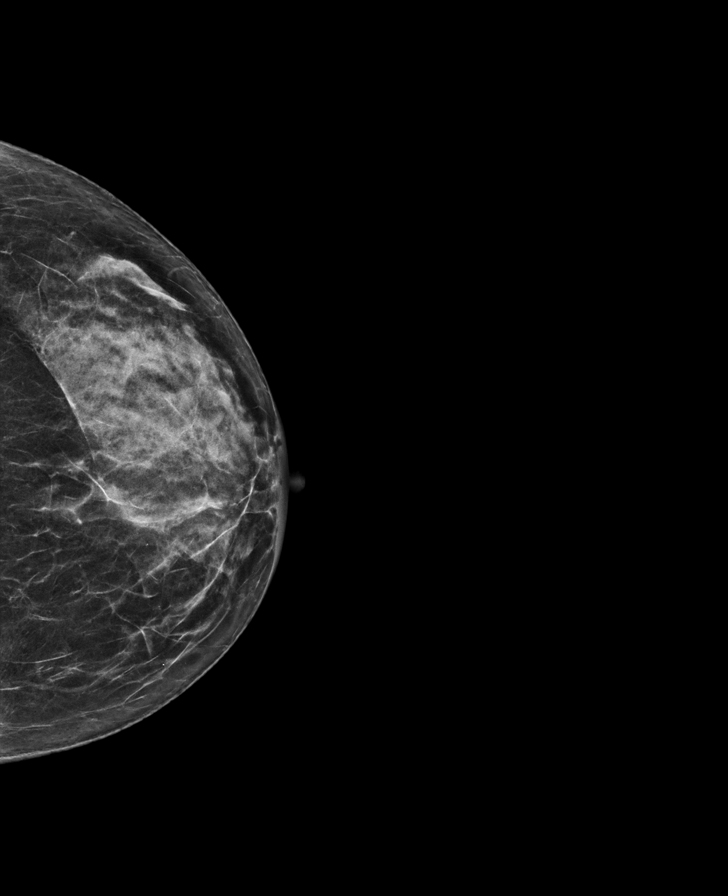

[R CC synth-2D]
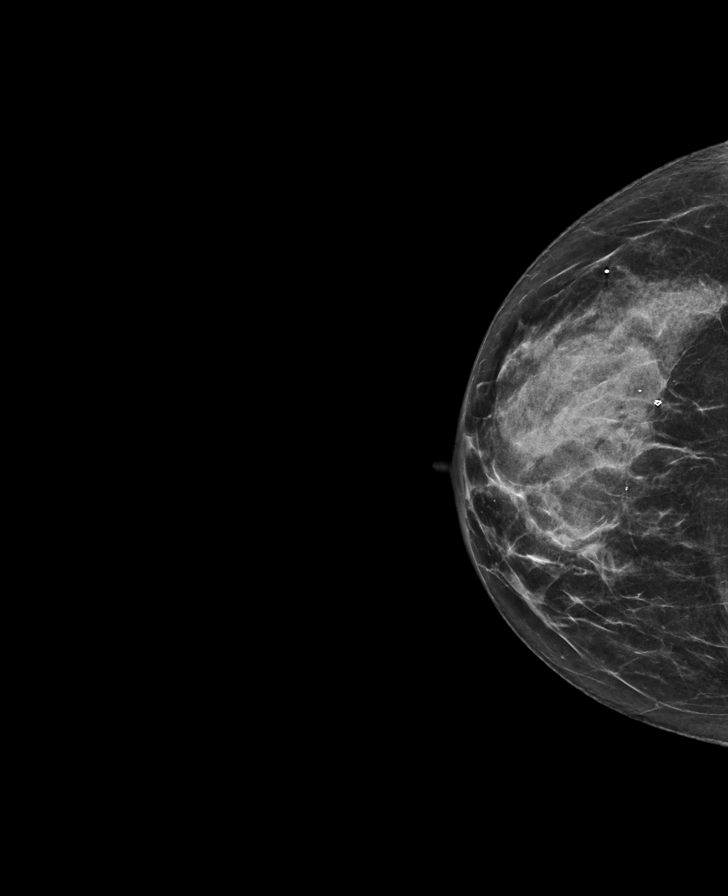

[L MLO tomo · tomo slice 32/63.0]
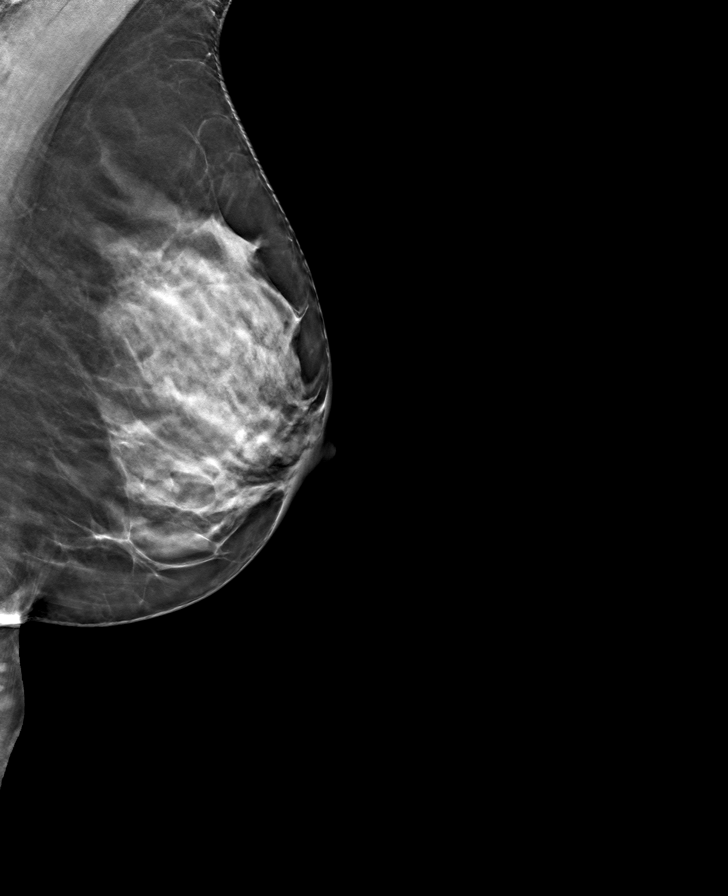

[R CC tomo · tomo slice 31/60.0]
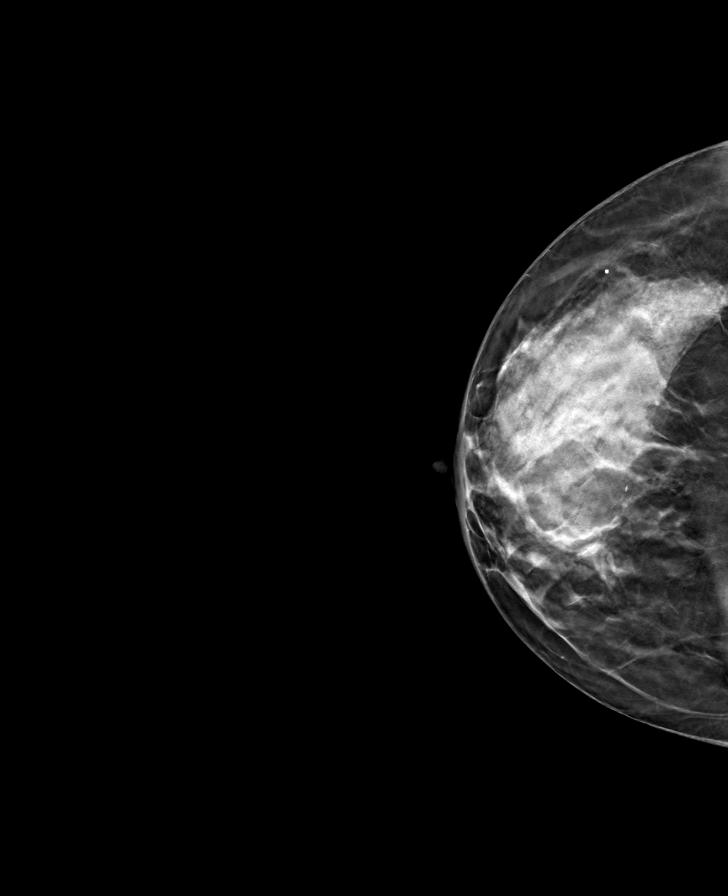

[L CC tomo · tomo slice 30/59.0]
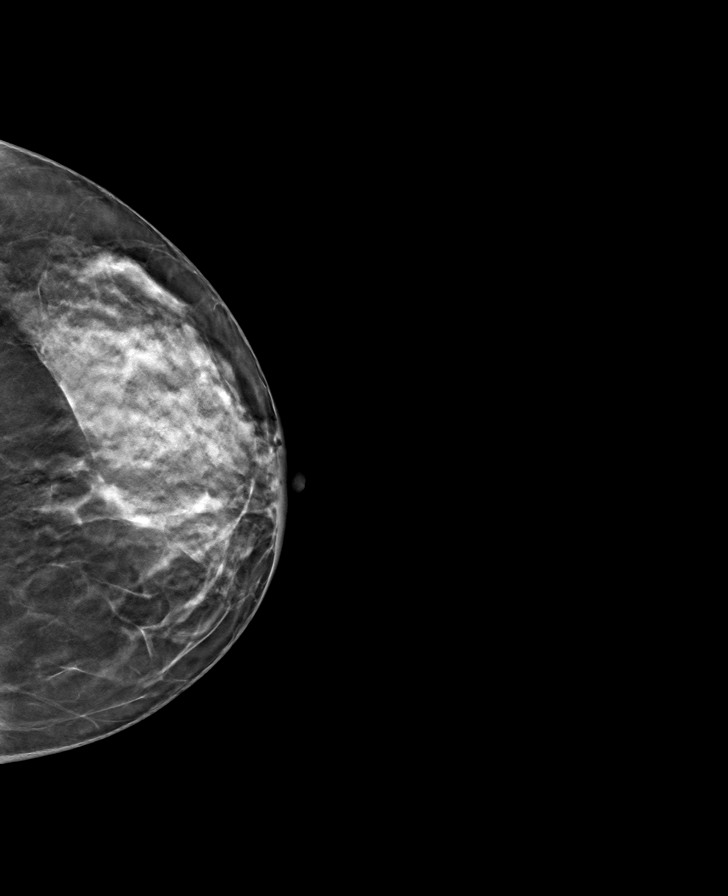

[R MLO tomo · tomo slice 35/69.0]
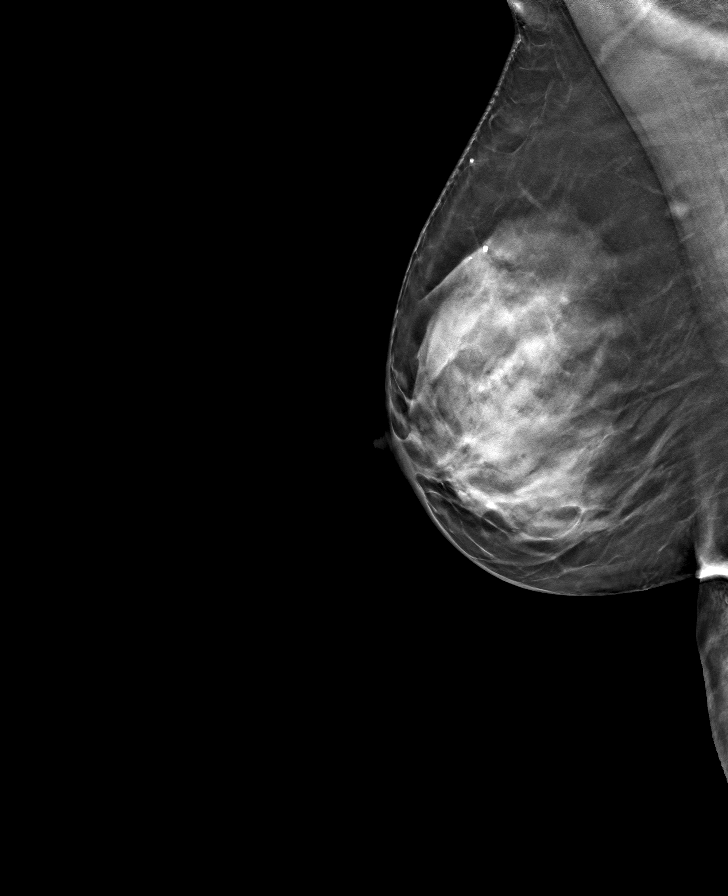

[8 of 24 positions shown; findings below may reference images not displayed]

ACR Breast Density Category c: The breast tissue is heterogeneously
dense, which may obscure small masses.
FINDINGS: No mass, distortion, or suspicious microcalcifications in either
breast to suggest malignancy.

Mammographic images were processed with CAD.

Targeted ultrasound is performed, showing normal heterogeneously
dense tissue in the upper central and upper outer left breast. No
solid or cystic mass or abnormal shadowing is identified to suggest
malignancy.
IMPRESSION: No evidence of malignancy in either breast.

RECOMMENDATION:
Screening mammogram in one year.(Code:[id number])

I have discussed the findings and recommendations with the patient.
If applicable, a reminder letter will be sent to the patient
regarding the next appointment.

BI-RADS CATEGORY  1: Negative.

## 2021-04-06 ENCOUNTER — Telehealth: Payer: Self-pay | Admitting: Family Medicine

## 2021-04-06 NOTE — Telephone Encounter (Signed)
Spoke to patient to schedule Medicare Annual Wellness Visit (AWV) either virtually or in office.  Patient wanted call back mid feb    Last AWV 01/25/17  please schedule at anytime with [facility] Nurse Health Advisor 1 or 2   This should be a 45 minute visit.

## 2021-04-27 ENCOUNTER — Telehealth: Payer: Self-pay | Admitting: Family Medicine

## 2021-04-27 NOTE — Telephone Encounter (Signed)
Spoke with patient to schedule Medicare Annual Wellness Visit (AWV) either virtually or in office.  Pt wanted a call back in a couple days she needs to look at  calendar   Last AWV ;01/25/17 please schedule at anytime with [facility] Nurse Health Advisor 1 or 2   This should be a 45 minute visit.

## 2021-05-04 ENCOUNTER — Telehealth: Payer: Self-pay | Admitting: Family Medicine

## 2021-05-04 NOTE — Telephone Encounter (Signed)
Left message for patient to call back and schedule Medicare Annual Wellness Visit (AWV) either virtually or in office. Left  my Tara Martin number [phone]   Last AWV 01/2017 please schedule at anytime with [facility] Nurse Health Advisor 1 or 2   This should be a 45 minute visit.

## 2021-05-06 DIAGNOSIS — D225 Melanocytic nevi of trunk: Secondary | ICD-10-CM | POA: Diagnosis not present

## 2021-05-06 DIAGNOSIS — L820 Inflamed seborrheic keratosis: Secondary | ICD-10-CM | POA: Diagnosis not present

## 2021-05-06 DIAGNOSIS — D2239 Melanocytic nevi of other parts of face: Secondary | ICD-10-CM | POA: Diagnosis not present

## 2021-05-06 DIAGNOSIS — L821 Other seborrheic keratosis: Secondary | ICD-10-CM | POA: Diagnosis not present

## 2021-05-21 DIAGNOSIS — Z4689 Encounter for fitting and adjustment of other specified devices: Secondary | ICD-10-CM | POA: Diagnosis not present

## 2021-06-15 ENCOUNTER — Telehealth: Payer: Self-pay | Admitting: Family Medicine

## 2021-06-15 NOTE — Telephone Encounter (Signed)
Left message for patient to call back and schedule Medicare Annual Wellness Visit (AWV) either virtually or in office. Left  my Herbie Drape number [phone] ? ? ?Last AWV ;01/25/17 ?please schedule at anytime with [facility] Nurse Health Advisor 1 or 2 ? ? ? ?

## 2021-06-16 ENCOUNTER — Telehealth: Payer: Self-pay | Admitting: Family Medicine

## 2021-06-16 NOTE — Telephone Encounter (Signed)
I spoke to patient and scheduled her awv.  Appointment scheduled 06/19/21.  She wanted to know if you recommend her getting shingles vaccine.  If so she wanted to know if she could get vaccine on her awv appt 06/19/21. ? ? ?

## 2021-06-19 ENCOUNTER — Ambulatory Visit (INDEPENDENT_AMBULATORY_CARE_PROVIDER_SITE_OTHER): Payer: Medicare HMO

## 2021-06-19 VITALS — BP 118/60 | HR 60 | Temp 97.6°F | Ht 64.0 in | Wt 142.5 lb

## 2021-06-19 DIAGNOSIS — Z0000 Encounter for general adult medical examination without abnormal findings: Secondary | ICD-10-CM

## 2021-06-19 NOTE — Patient Instructions (Addendum)
?Ms. Bolding , ?Thank you for taking time to come for your Medicare Wellness Visit. I appreciate your ongoing commitment to your health goals. Please review the following plan we discussed and let me know if I can assist you in the future.  ? ?These are the goals we discussed: ? Goals   ? ?   DIET - REDUCE SUGAR INTAKE (pt-stated)   ?   I want to join a yoga class and walk more. ?  ? ?  ?  ?This is a list of the screening recommended for you and due dates:  ?Health Maintenance  ?Topic Date Due  ? COVID-19 Vaccine (3 - Booster for Pfizer series) 07/05/2021*  ? Zoster (Shingles) Vaccine (1 of 2) 09/18/2021*  ? Mammogram  06/20/2022*  ? Hepatitis C Screening: USPSTF Recommendation to screen - Ages 18-79 yo.  06/20/2022*  ? Flu Shot  10/06/2021  ? Tetanus Vaccine  10/23/2023  ? Pneumonia Vaccine  Completed  ? DEXA scan (bone density measurement)  Completed  ? HPV Vaccine  Aged Out  ?*Topic was postponed. The date shown is not the original due date.  ? ?Advanced directives: No ? ?Conditions/risks identified: None ? ?Next appointment: Follow up in one year for your annual wellness visit  ? ? ?Preventive Care 25 Years and Older, Female ?Preventive care refers to lifestyle choices and visits with your health care provider that can promote health and wellness. ?What does preventive care include? ?A yearly physical exam. This is also called an annual well check. ?Dental exams once or twice a year. ?Routine eye exams. Ask your health care provider how often you should have your eyes checked. ?Personal lifestyle choices, including: ?Daily care of your teeth and gums. ?Regular physical activity. ?Eating a healthy diet. ?Avoiding tobacco and drug use. ?Limiting alcohol use. ?Practicing safe sex. ?Taking low-dose aspirin every day. ?Taking vitamin and mineral supplements as recommended by your health care provider. ?What happens during an annual well check? ?The services and screenings done by your health care provider during your  annual well check will depend on your age, overall health, lifestyle risk factors, and family history of disease. ?Counseling  ?Your health care provider may ask you questions about your: ?Alcohol use. ?Tobacco use. ?Drug use. ?Emotional well-being. ?Home and relationship well-being. ?Sexual activity. ?Eating habits. ?History of falls. ?Memory and ability to understand (cognition). ?Work and work Statistician. ?Reproductive health. ?Screening  ?You may have the following tests or measurements: ?Height, weight, and BMI. ?Blood pressure. ?Lipid and cholesterol levels. These may be checked every 5 years, or more frequently if you are over 37 years old. ?Skin check. ?Lung cancer screening. You may have this screening every year starting at age 87 if you have a 30-pack-year history of smoking and currently smoke or have quit within the past 15 years. ?Fecal occult blood test (FOBT) of the stool. You may have this test every year starting at age 25. ?Flexible sigmoidoscopy or colonoscopy. You may have a sigmoidoscopy every 5 years or a colonoscopy every 10 years starting at age 64. ?Hepatitis C blood test. ?Hepatitis B blood test. ?Sexually transmitted disease (STD) testing. ?Diabetes screening. This is done by checking your blood sugar (glucose) after you have not eaten for a while (fasting). You may have this done every 1-3 years. ?Bone density scan. This is done to screen for osteoporosis. You may have this done starting at age 53. ?Mammogram. This may be done every 1-2 years. Talk to your health care provider about  how often you should have regular mammograms. ?Talk with your health care provider about your test results, treatment options, and if necessary, the need for more tests. ?Vaccines  ?Your health care provider may recommend certain vaccines, such as: ?Influenza vaccine. This is recommended every year. ?Tetanus, diphtheria, and acellular pertussis (Tdap, Td) vaccine. You may need a Td booster every 10  years. ?Zoster vaccine. You may need this after age 11. ?Pneumococcal 13-valent conjugate (PCV13) vaccine. One dose is recommended after age 74. ?Pneumococcal polysaccharide (PPSV23) vaccine. One dose is recommended after age 62. ?Talk to your health care provider about which screenings and vaccines you need and how often you need them. ?This information is not intended to replace advice given to you by your health care provider. Make sure you discuss any questions you have with your health care provider. ?Document Released: 03/21/2015 Document Revised: 11/12/2015 Document Reviewed: 12/24/2014 ?Elsevier Interactive Patient Education ? 2017 [place]. ? ?Fall Prevention in the Home ?Falls can cause injuries. They can happen to people of all ages. There are many things you can do to make your home safe and to help prevent falls. ?What can I do on the outside of my home? ?Regularly fix the edges of walkways and driveways and fix any cracks. ?Remove anything that might make you trip as you walk through a door, such as a raised step or threshold. ?Trim any bushes or trees on the path to your home. ?Use bright outdoor lighting. ?Clear any walking paths of anything that might make someone trip, such as rocks or tools. ?Regularly check to see if handrails are loose or broken. Make sure that both sides of any steps have handrails. ?Any raised decks and porches should have guardrails on the edges. ?Have any leaves, snow, or ice cleared regularly. ?Use sand or salt on walking paths during winter. ?Clean up any spills in your garage right away. This includes oil or grease spills. ?What can I do in the bathroom? ?Use night lights. ?Install grab bars by the toilet and in the tub and shower. Do not use towel bars as grab bars. ?Use non-skid mats or decals in the tub or shower. ?If you need to sit down in the shower, use a plastic, non-slip stool. ?Keep the floor dry. Clean up any water that spills on the floor as soon as it  happens. ?Remove soap buildup in the tub or shower regularly. ?Attach bath mats securely with double-sided non-slip rug tape. ?Do not have throw rugs and other things on the floor that can make you trip. ?What can I do in the bedroom? ?Use night lights. ?Make sure that you have a light by your bed that is easy to reach. ?Do not use any sheets or blankets that are too big for your bed. They should not hang down onto the floor. ?Have a firm chair that has side arms. You can use this for support while you get dressed. ?Do not have throw rugs and other things on the floor that can make you trip. ?What can I do in the kitchen? ?Clean up any spills right away. ?Avoid walking on wet floors. ?Keep items that you use a lot in easy-to-reach places. ?If you need to reach something above you, use a strong step stool that has a grab bar. ?Keep electrical cords out of the way. ?Do not use floor polish or wax that makes floors slippery. If you must use wax, use non-skid floor wax. ?Do not have throw rugs and other  things on the floor that can make you trip. ?What can I do with my stairs? ?Do not leave any items on the stairs. ?Make sure that there are handrails on both sides of the stairs and use them. Fix handrails that are broken or loose. Make sure that handrails are as long as the stairways. ?Check any carpeting to make sure that it is firmly attached to the stairs. Fix any carpet that is loose or worn. ?Avoid having throw rugs at the top or bottom of the stairs. If you do have throw rugs, attach them to the floor with carpet tape. ?Make sure that you have a light switch at the top of the stairs and the bottom of the stairs. If you do not have them, ask someone to add them for you. ?What else can I do to help prevent falls? ?Wear shoes that: ?Do not have high heels. ?Have rubber bottoms. ?Are comfortable and fit you well. ?Are closed at the toe. Do not wear sandals. ?If you use a stepladder: ?Make sure that it is fully opened.  Do not climb a closed stepladder. ?Make sure that both sides of the stepladder are locked into place. ?Ask someone to hold it for you, if possible. ?Clearly mark and make sure that you can see: ?Any grab bars o

## 2021-06-19 NOTE — Progress Notes (Signed)
? ?Subjective:  ? Tara Martin is a 81 y.o. female who presents for Medicare Annual (Subsequent) preventive examination. ? ?Review of Systems    ? ? ?   ?Objective:  ?  ?Today's Vitals  ? 06/19/21 1026  ?BP: 118/60  ?Pulse: 60  ?Temp: 97.6 ?F (36.4 ?C)  ?TempSrc: Oral  ?SpO2: 97%  ?Weight: 142 lb 8 oz (64.6 kg)  ?Height: '5\' 4"'$  (1.626 m)  ? ?Body mass index is 24.46 kg/m?. ? ? ?  06/19/2021  ? 10:42 AM 02/02/2020  ?  6:53 AM 01/25/2017  ? 11:48 AM 01/25/2017  ? 11:07 AM 02/11/2014  ?  4:20 PM 02/11/2014  ?  9:23 AM 10/23/2013  ?  4:10 PM  ?Advanced Directives  ?Does Patient Have a Medical Advance Directive? No No Yes Yes Yes Yes Yes  ?Type of Advance Directive     Living will Living will Living will;Healthcare Power of Attorney  ?Does patient want to make changes to medical advance directive?     No - Patient declined No - Patient declined   ?Copy of [place] in Chart?     No - copy requested    ?Would patient like information on creating a medical advance directive? No - Patient declined        ? ? ?Current Medications (verified) ?Outpatient Encounter Medications as of 06/19/2021  ?Medication Sig  ? ALPRAZolam (XANAX) 0.5 MG tablet Take 0.5 mg by mouth as needed for anxiety.   ? aspirin 81 MG tablet Take 81 mg by mouth daily.  ? diclofenac sodium (VOLTAREN) 1 % GEL Apply 3 gm to 3 large joints up to 3 times a day.Dispense 3 tubes with 3 refills.  ? Multiple Vitamins-Minerals (MULTIVITAMIN WITH MINERALS) tablet Take 1 tablet by mouth daily.  ? amphetamine-dextroamphetamine (ADDERALL) 10 MG tablet Take 1 tablet (10 mg total) by mouth daily with breakfast. (Patient not taking: Reported on 06/19/2021)  ? donepezil (ARICEPT) 10 MG tablet Take 1 tablet (10 mg total) by mouth at bedtime. (Patient not taking: Reported on 06/19/2021)  ? ?No facility-administered encounter medications on file as of 06/19/2021.  ? ? ?Allergies (verified) ?Codeine, Penicillins, and Sulfa antibiotics  ? ?History: ?Past Medical  History:  ?Diagnosis Date  ? Anxiety state, unspecified - sees Dr. Nori Riis in gyn and benzo prescribed there 04/09/2013  ? Arthritis   ? Chicken pox   ? Lupus ([place])   ? MCI (mild cognitive impairment) 05/20/2020  ? Migraines   ? Prediabetes   ? ?Past Surgical History:  ?Procedure Laterality Date  ? BACK SURGERY  2000  ? CHOLECYSTECTOMY    ? [place] OF UTERUS  1973  ? [place]  ? ?Family History  ?Problem Relation Age of Onset  ? Arthritis Mother   ? Diabetes Mother   ? Heart disease Mother   ? Heart attack Mother   ? Pneumonia Father   ? COPD Sister   ? Heart disease Brother   ? Pulmonary embolism Brother   ? Alzheimer's disease Brother   ? Alzheimer's disease Brother   ? Alcoholism Other   ? Arthritis Other   ? Hypertension Other   ? Sudden death Other   ? Migraines Daughter   ? Colon cancer Neg Hx   ? ?Social History  ? ?Socioeconomic History  ? Marital status: Married  ?  Spouse name: chuck  ? Number of children: Not on file  ? Years of education: Not on  file  ? Highest education level: Not on file  ?Occupational History  ? Occupation: retired  ?Tobacco Use  ? Smoking status: Never  ? Smokeless tobacco: Never  ?Vaping Use  ? Vaping Use: Never used  ?Substance and Sexual Activity  ? Alcohol use: Yes  ?  Alcohol/week: 2.0 standard drinks  ?  Types: 2 Glasses of wine per week  ? Drug use: No  ? Sexual activity: Not on file  ?Other Topics Concern  ? Not on file  ?Social History Narrative  ? Lives with husband  ? Right Handed  ? Drinks 1 cup maybe every other day  ? ?Social Determinants of Health  ? ?Financial Resource Strain: Low Risk   ? Difficulty of Paying Living Expenses: Not hard at all  ?Food Insecurity: No Food Insecurity  ? Worried About Charity fundraiser in the Last Year: Never true  ? Ran Out of Food in the Last Year: Never true  ?Transportation Needs: No Transportation Needs  ? Lack of Transportation (Medical): No  ? Lack of Transportation (Non-Medical): No  ?Physical Activity:  Insufficiently Active  ? Days of Exercise per Week: 2 days  ? Minutes of Exercise per Session: 30 min  ?Stress: No Stress Concern Present  ? Feeling of Stress : Not at all  ?Social Connections: Socially Integrated  ? Frequency of Communication with Friends and Family: More than three times a week  ? Frequency of Social Gatherings with Friends and Family: More than three times a week  ? Attends Religious Services: More than 4 times per year  ? Active Member of Clubs or Organizations: Yes  ? Attends Archivist Meetings: More than 4 times per year  ? Marital Status: Married  ? ? ?Clinical Intake: ?How often do you need to have someone help you when you read instructions, pamphlets, or other written materials from your doctor or pharmacy?: 1 - Never ? ?Diabetic?  No ? ?Activities of Daily Living ? ?  06/19/2021  ? 10:40 AM  ?In your present state of health, do you have any difficulty performing the following activities:  ?Hearing? 0  ?Vision? 0  ?Difficulty concentrating or making decisions? 0  ?Walking or climbing stairs? 0  ?Dressing or bathing? 0  ?Doing errands, shopping? 0  ?Preparing Food and eating ? N  ?Using the Toilet? N  ?In the past six months, have you accidently leaked urine? N  ?Do you have problems with loss of bowel control? N  ?Managing your Medications? N  ?Managing your Finances? N  ?Housekeeping or managing your Housekeeping? N  ? ? ?Patient Care Team: ?Eulas Post, MD as PCP - General (Family Medicine) ?Bo Merino, MD as Consulting Physician (Rheumatology) ?Maisie Fus, MD as Consulting Physician (Obstetrics and Gynecology) ?Rachel Moulds, DO as Referring Physician (Internal Medicine) ? ?Indicate any recent Medical Services you may have received from other than [facility] providers in the past year (date may be approximate). ? ?   ?Assessment:  ? This is a routine wellness examination for Tara Martin. ? ?Hearing/Vision screen ?Hearing Screening - Comments:: No hearing  difficulty ?Vision Screening - Comments:: No vision difficulty. Followed by Dr Prudencio Burly ? ?Dietary issues and exercise activities discussed: ?Exercise limited by: None identified ? ? Goals Addressed   ? ?  ?  ?  ?  ?  ? This Visit's Progress  ?   DIET - REDUCE SUGAR INTAKE (pt-stated)     ?   I want to join a  yoga class and walk more. ?  ? ?  ? ?Depression Screen ? ?  06/19/2021  ? 10:38 AM 12/11/2019  ?  8:31 AM 01/25/2017  ? 11:09 AM 04/23/2014  ? 10:25 AM  ?PHQ 2/9 Scores  ?PHQ - 2 Score 0 2 0 0  ?PHQ- 9 Score  4    ?  ?Fall Risk ? ?  06/19/2021  ? 10:40 AM 05/12/2020  ?  2:07 PM 12/11/2019  ?  8:31 AM 01/25/2017  ? 11:09 AM 04/23/2014  ? 10:25 AM  ?Fall Risk   ?Falls in the past year? 0 1 0 No No  ?Number falls in past yr: 0 0 0    ?Injury with Fall? 0 1 0    ?Comment  ankle lt     ?Risk for fall due to : No Fall Risks Impaired mobility No Fall Risks    ?Follow up  Falls evaluation completed Falls evaluation completed    ? ? ?FALL RISK PREVENTION PERTAINING TO THE HOME: ? ?Any stairs in or around the home? Yes  ?If so, are there any without handrails? No  ?Home free of loose throw rugs in walkways, pet beds, electrical cords, etc? Yes  ?Adequate lighting in your home to reduce risk of falls? Yes  ? ?ASSISTIVE DEVICES UTILIZED TO PREVENT FALLS: ? ?Life alert? No  ?Use of a cane, walker or w/c? No  ?Grab bars in the bathroom? No  ?Shower chair or bench in shower? No  ?Elevated toilet seat or a handicapped toilet? No  ? ?TIMED UP AND GO: ? ?Was the test performed? Yes .  ?Length of time to ambulate 10 feet: 5 sec.  ? ?Gait steady and fast without use of assistive device ? ?Cognitive Function: ? ?  02/26/2021  ?  7:38 AM 05/20/2020  ?  9:18 AM 02/08/2018  ? 12:13 PM  ?MMSE - Mini Mental State Exam  ?Orientation to time '5 2 5  '$ ?Orientation to Place '5 5 4  '$ ?Registration '3 3 3  '$ ?Attention/ Calculation '2 5 5  '$ ?Recall 0 1 1  ?Language- name 2 objects '2 2 2  '$ ?Language- repeat '1 1 1  '$ ?Language- follow 3 step command '3 3 3  '$ ?Language-  read & follow direction '1 1 1  '$ ?Write a sentence '1 1 1  '$ ?Copy design '1 1 1  '$ ?Total score '24 25 27  '$ ? ?  ? ?  06/19/2021  ? 10:42 AM  ?6CIT Screen  ?What Year? 0 points  ?What month? 0 points  ?What time? 0 points  ?C

## 2021-06-22 NOTE — Telephone Encounter (Signed)
Can you help patient with this? 

## 2021-06-22 NOTE — Telephone Encounter (Signed)
LVM--pt will have to get her vaccine at the pharmacy--she is over 81 years old. ?

## 2021-08-12 DIAGNOSIS — Z4689 Encounter for fitting and adjustment of other specified devices: Secondary | ICD-10-CM | POA: Diagnosis not present

## 2021-08-28 ENCOUNTER — Encounter: Payer: Self-pay | Admitting: Family Medicine

## 2021-08-28 ENCOUNTER — Ambulatory Visit (INDEPENDENT_AMBULATORY_CARE_PROVIDER_SITE_OTHER): Payer: Medicare HMO | Admitting: Family Medicine

## 2021-08-28 VITALS — BP 122/70 | HR 70 | Temp 97.9°F | Ht 64.0 in | Wt 141.7 lb

## 2021-08-28 DIAGNOSIS — M1811 Unilateral primary osteoarthritis of first carpometacarpal joint, right hand: Secondary | ICD-10-CM | POA: Diagnosis not present

## 2021-09-01 ENCOUNTER — Ambulatory Visit: Payer: Medicare HMO | Admitting: Neurology

## 2021-09-15 DIAGNOSIS — Z4689 Encounter for fitting and adjustment of other specified devices: Secondary | ICD-10-CM | POA: Diagnosis not present

## 2021-09-15 DIAGNOSIS — R69 Illness, unspecified: Secondary | ICD-10-CM | POA: Diagnosis not present

## 2021-10-05 ENCOUNTER — Other Ambulatory Visit: Payer: Self-pay | Admitting: Family Medicine

## 2021-10-05 MED ORDER — DONEPEZIL HCL 10 MG PO TABS: 10.0000 mg | ORAL_TABLET | Freq: Every day | ORAL | 1 refills | Status: DC

## 2021-12-16 ENCOUNTER — Encounter: Payer: Self-pay | Admitting: Physician Assistant

## 2021-12-16 ENCOUNTER — Ambulatory Visit: Payer: Medicare HMO | Admitting: Physician Assistant

## 2021-12-16 VITALS — BP 110/64 | HR 78 | Ht 64.0 in | Wt 139.0 lb

## 2021-12-16 DIAGNOSIS — R198 Other specified symptoms and signs involving the digestive system and abdomen: Secondary | ICD-10-CM

## 2021-12-16 DIAGNOSIS — R159 Full incontinence of feces: Secondary | ICD-10-CM | POA: Diagnosis not present

## 2021-12-16 NOTE — Patient Instructions (Signed)
If you are age 80 or older, your body mass index should be between 23-30. Your Body mass index is 23.86 kg/m. If this is out of the aforementioned range listed, please consider follow up with your Primary Care Provider.  If you are age 23 or younger, your body mass index should be between 19-25. Your Body mass index is 23.86 kg/m. If this is out of the aformentioned range listed, please consider follow up with your Primary Care Provider.   ________________________________________________________  The [place] [facility] providers would like to encourage you to use [facility] to communicate with providers for non-urgent requests or questions.  Due to long hold times on the telephone, sending your provider a message by [facility] may be a faster and more efficient way to get a response.  Please allow 48 business hours for a response.  Please remember that this is for non-urgent requests.  _______________________________________________________   Start back on Metamucil daily in water.  Call back in 3 weeks & speak to Amy's nurse [place] an update.   It was a pleasure to see you today!  Thank you for trusting me with your gastrointestinal care!

## 2021-12-16 NOTE — Progress Notes (Signed)
Subjective:    Patient ID: Tara Martin, female    DOB: May 17, 1940, 81 y.o.   MRN: [id number]  HPI  Tara Martin is a pleasant 81 year old white female, established with Dr. Fuller Plan.  She was last seen here in 2015 when she underwent colonoscopy for screening and this was a negative exam other than mild diverticulosis. She comes in today with complaints of change in bowel habits over the past month.  She has had 2 episodes of fecal incontinence and is having very frequent episodes of passing small bowel movements whenever she sits down to urinate.  She has not started any new medications or had any changes in medication dosages, no changes in diet supplements etc.  She has no complaints of abdominal pain or rectal pain, no melena or hematochezia.  She denies any difficulty with urinary incontinence.  She does have history of vaginal prolapse and wears a pessary which she has had for several years.  She says her stools over the past month will be formed to soft. On further questioning the only change that she is made was stopping Metamucil which she says she stopped about a month ago as she was having good bowel movements and was not sure that she needed it. She is concerned about whether she may need a colonoscopy. Medical problems include history of lupus, anxiety, and mild cognitive impairment.  She is status post cholecystectomy.  Review of Systems Pertinent positive and negative review of systems were noted in the above HPI section.  All other review of systems was otherwise negative.   Outpatient Encounter Medications as of 12/16/2021  Medication Sig   ALPRAZolam (XANAX) 0.5 MG tablet Take 0.5 mg by mouth as needed for anxiety.    aspirin 81 MG tablet Take 81 mg by mouth daily.   diclofenac sodium (VOLTAREN) 1 % GEL Apply 3 gm to 3 large joints up to 3 times a day.Dispense 3 tubes with 3 refills.   donepezil (ARICEPT) 10 MG tablet Take 1 tablet (10 mg total) by mouth at bedtime.   Multiple  Vitamins-Minerals (MULTIVITAMIN WITH MINERALS) tablet Take 1 tablet by mouth daily.   amphetamine-dextroamphetamine (ADDERALL) 10 MG tablet Take 1 tablet (10 mg total) by mouth daily with breakfast. (Patient not taking: Reported on 12/16/2021)   No facility-administered encounter medications on file as of 12/16/2021.   Allergies  Allergen Reactions   Codeine     Per patient made her "flip out"   Penicillins Hives   Sulfa Antibiotics Hives   Patient Active Problem List   Diagnosis Date Noted   MCI (mild cognitive impairment) 05/20/2020   Acute midline low back pain with bilateral sciatica 11/05/2014   Facial numbness 02/11/2014   Prediabetes    Lupus - followed by rheum 04/09/2013   Anxiety state 04/09/2013   Social History   Socioeconomic History   Marital status: Married    Spouse name: Tara Martin   Number of children: 1   Years of education: Not on file   Highest education level: Not on file  Occupational History   Occupation: retired  Tobacco Use   Smoking status: Never   Smokeless tobacco: Never  Vaping Use   Vaping Use: Never used  Substance and Sexual Activity   Alcohol use: Not Currently    Alcohol/week: 2.0 standard drinks of alcohol    Types: 2 Glasses of wine per week   Drug use: No   Sexual activity: Not on file  Other Topics Concern   Not  on file  Social History Narrative   Lives with husband   Right Handed   Drinks 1 cup maybe every other day   Social Determinants of Health   Financial Resource Strain: Low Risk  (06/19/2021)   Overall Financial Resource Strain (CARDIA)    Difficulty of Paying Living Expenses: Not hard at all  Food Insecurity: No Food Insecurity (06/19/2021)   Hunger Vital Sign    Worried About Running Out of Food in the Last Year: Never true    Ran Out of Food in the Last Year: Never true  Transportation Needs: No Transportation Needs (06/19/2021)   PRAPARE - Hydrologist (Medical): No    Lack of  Transportation (Non-Medical): No  Physical Activity: Insufficiently Active (06/19/2021)   Exercise Vital Sign    Days of Exercise per Week: 2 days    Minutes of Exercise per Session: 30 min  Stress: No Stress Concern Present (06/19/2021)   [place]    Feeling of Stress : Not at all  Social Connections: [place] (06/19/2021)   Social Connection and Isolation Panel [NHANES]    Frequency of Communication with Friends and Family: More than three times a week    Frequency of Social Gatherings with Friends and Family: More than three times a week    Attends Religious Services: More than 4 times per year    Active Member of [employer] or Organizations: Yes    Attends Archivist Meetings: More than 4 times per year    Marital Status: Married  Human resources officer Violence: Not At Risk (06/19/2021)   Humiliation, Afraid, Rape, and Kick questionnaire    Fear of Current or Ex-Partner: No    Emotionally Abused: No    Physically Abused: No    Sexually Abused: No    Ms. Swavely's family history includes Alcoholism in an other family member; Alzheimer's disease in her brother and sister; Arthritis in her mother and another family member; COPD in her sister; Diabetes in her mother; Heart attack in her mother; Heart disease in her brother and mother; Hypertension in an other family member; Migraines in her daughter; Pneumonia in her father; Pulmonary embolism in her brother; Sudden death in an other family member.      Objective:    Vitals:   12/16/21 1430  BP: 110/64  Pulse: 78  SpO2: 98%    Physical Exam Well-developed well-nourished eld WF  in no acute distress.  Height, Weight,139  BMI23.8 pt is here alone today  HEENT; nontraumatic normocephalic, EOMI, PE R LA, sclera anicteric. Oropharynx;not examined Neck; supple, no JVD Cardiovascular; regular rate and rhythm with S1-S2, no murmur rub or gallop Pulmonary;  Clear bilaterally Abdomen; soft, nontender, nondistended, no palpable mass or hepatosplenomegaly, bowel sounds are active cholecystectomy scar Rectal;no external lesions noted , scant stool in vault, brown heme negative,no palpable abnormality , decreased anal sphincter tone Skin; benign exam, no jaundice rash or appreciable lesions Extremities; no clubbing cyanosis or edema skin warm and dry Neuro/Psych; alert and oriented x4, grossly nonfocal mood and affect appropriate        Assessment & Plan:   #40 81 year old white female with change in bowels noted over the past 1 month.  Patient is passing small bowel movements most of the time when she sits to urinate, and has also had a couple of episodes of more overt fecal incontinence. No associated abdominal pain or rectal pain, no melena  or hematochezia. Rectal exam today reveals brown heme-negative stool, no palpable abnormality and decreased sphincter tone Patient had stopped taking Metamucil about a month ago as she was not certain that she needed it  I suspect her symptoms are secondary to pelvic floor laxity/pelvic floor dysfunction especially in light of the fact that she has vaginal prolapse and requires a pessary. Perhaps stopping Metamucil which had been bulking her stool accounts for the change.  Cannot rule out other colorectal abnormality-last colonoscopy 2015 normal exception of mild diverticulosis.  #2 mild cognitive impairment-on Aricept #3.  Anxiety #4.  History of lupus #5 status post cholecystectomy  Plan; patient will restart Metamucil daily in a glass of water to bulk stool.  This is usually helpful with partial incontinence symptoms secondary to pelvic floor laxity. I have asked her to try this for 2 to 3 weeks and then call back with an update.  If symptoms are persisting then we will discuss scheduling her for colonoscopy with Dr. Fuller Plan.    Pedro Whiters Genia Harold PA-C 12/16/2021   Cc: Eulas Post, MD

## 2022-01-18 DIAGNOSIS — N8182 Incompetence or weakening of pubocervical tissue: Secondary | ICD-10-CM | POA: Diagnosis not present

## 2022-01-20 DIAGNOSIS — R051 Acute cough: Secondary | ICD-10-CM | POA: Diagnosis not present

## 2022-03-25 DIAGNOSIS — N8182 Incompetence or weakening of pubocervical tissue: Secondary | ICD-10-CM | POA: Diagnosis not present

## 2022-03-25 DIAGNOSIS — Z4689 Encounter for fitting and adjustment of other specified devices: Secondary | ICD-10-CM | POA: Diagnosis not present

## 2022-04-03 ENCOUNTER — Other Ambulatory Visit: Payer: Self-pay | Admitting: Family Medicine

## 2022-04-05 NOTE — Telephone Encounter (Signed)
I spoke with the patient and she stated she does not need a refill at this time and will schedule follow up to discuss medication.

## 2022-04-22 DIAGNOSIS — N950 Postmenopausal bleeding: Secondary | ICD-10-CM | POA: Diagnosis not present

## 2022-04-22 DIAGNOSIS — R319 Hematuria, unspecified: Secondary | ICD-10-CM | POA: Diagnosis not present

## 2022-04-22 DIAGNOSIS — N939 Abnormal uterine and vaginal bleeding, unspecified: Secondary | ICD-10-CM | POA: Diagnosis not present

## 2022-04-22 DIAGNOSIS — R9389 Abnormal findings on diagnostic imaging of other specified body structures: Secondary | ICD-10-CM | POA: Diagnosis not present

## 2022-05-06 DIAGNOSIS — N950 Postmenopausal bleeding: Secondary | ICD-10-CM | POA: Diagnosis not present

## 2022-06-16 DIAGNOSIS — L821 Other seborrheic keratosis: Secondary | ICD-10-CM | POA: Diagnosis not present

## 2022-06-16 DIAGNOSIS — S80861A Insect bite (nonvenomous), right lower leg, initial encounter: Secondary | ICD-10-CM | POA: Diagnosis not present

## 2022-06-17 ENCOUNTER — Telehealth: Payer: Self-pay | Admitting: Family Medicine

## 2022-06-17 NOTE — Telephone Encounter (Signed)
Contacted Tara Martin to schedule their annual wellness visit. Appointment made for 06/25/22.  [name] AWV direct phone # [phone]   Spoke with patient to r/s 4/16 appt due to schedule change  rs to 4/19

## 2022-06-23 DIAGNOSIS — Z01419 Encounter for gynecological examination (general) (routine) without abnormal findings: Secondary | ICD-10-CM | POA: Diagnosis not present

## 2022-06-23 DIAGNOSIS — Z4689 Encounter for fitting and adjustment of other specified devices: Secondary | ICD-10-CM | POA: Diagnosis not present

## 2022-06-23 DIAGNOSIS — N950 Postmenopausal bleeding: Secondary | ICD-10-CM | POA: Diagnosis not present

## 2022-06-23 DIAGNOSIS — Z6825 Body mass index (BMI) 25.0-25.9, adult: Secondary | ICD-10-CM | POA: Diagnosis not present

## 2022-06-25 ENCOUNTER — Ambulatory Visit (INDEPENDENT_AMBULATORY_CARE_PROVIDER_SITE_OTHER): Payer: Medicare HMO

## 2022-06-25 VITALS — BP 120/60 | HR 74 | Temp 98.2°F | Ht 64.0 in | Wt 146.0 lb

## 2022-06-25 DIAGNOSIS — Z0000 Encounter for general adult medical examination without abnormal findings: Secondary | ICD-10-CM

## 2022-06-25 NOTE — Patient Instructions (Addendum)
Tara Martin , Thank you for taking time to come for your Medicare Wellness Visit. I appreciate your ongoing commitment to your health goals. Please review the following plan we discussed and let me know if I can assist you in the future.   These are the goals we discussed:  Goals       DIET - REDUCE SUGAR INTAKE (pt-stated)      I want to join a yoga class and walk more.      No current goals (pt-stated)        This is a list of the screening recommended for you and due dates:  Health Maintenance  Topic Date Due   COVID-19 Vaccine (3 - 2023-24 season) 07/11/2022*   Zoster (Shingles) Vaccine (1 of 2) 09/24/2022*   Mammogram  06/25/2023*   Flu Shot  10/07/2022   Medicare Annual Wellness Visit  06/25/2023   DTaP/Tdap/Td vaccine (3 - Td or Tdap) 10/23/2023   Pneumonia Vaccine  Completed   DEXA scan (bone density measurement)  Completed   HPV Vaccine  Aged Out  *Topic was postponed. The date shown is not the original due date.    Advanced directives: Advance directive discussed with you today. Even though you declined this today, please call our office should you change your mind, and we can give you the proper paperwork for you to fill out.   Conditions/risks identified: None  Next appointment: Follow up in one year for your annual wellness visit    Preventive Care 65 Years and Older, Female Preventive care refers to lifestyle choices and visits with your health care provider that can promote health and wellness. What does preventive care include? A yearly physical exam. This is also called an annual well check. Dental exams once or twice a year. Routine eye exams. Ask your health care provider how often you should have your eyes checked. Personal lifestyle choices, including: Daily care of your teeth and gums. Regular physical activity. Eating a healthy diet. Avoiding tobacco and drug use. Limiting alcohol use. Practicing safe sex. Taking low-dose aspirin every  day. Taking vitamin and mineral supplements as recommended by your health care provider. What happens during an annual well check? The services and screenings done by your health care provider during your annual well check will depend on your age, overall health, lifestyle risk factors, and family history of disease. Counseling  Your health care provider may ask you questions about your: Alcohol use. Tobacco use. Drug use. Emotional well-being. Home and relationship well-being. Sexual activity. Eating habits. History of falls. Memory and ability to understand (cognition). Work and work Astronomer. Reproductive health. Screening  You may have the following tests or measurements: Height, weight, and BMI. Blood pressure. Lipid and cholesterol levels. These may be checked every 5 years, or more frequently if you are over 10 years old. Skin check. Lung cancer screening. You may have this screening every year starting at age 44 if you have a 30-pack-year history of smoking and currently smoke or have quit within the past 15 years. Fecal occult blood test (FOBT) of the stool. You may have this test every year starting at age 30. Flexible sigmoidoscopy or colonoscopy. You may have a sigmoidoscopy every 5 years or a colonoscopy every 10 years starting at age 70. Hepatitis C blood test. Hepatitis B blood test. Sexually transmitted disease (STD) testing. Diabetes screening. This is done by checking your blood sugar (glucose) after you have not eaten for a while (fasting). You may have  this done every 1-3 years. Bone density scan. This is done to screen for osteoporosis. You may have this done starting at age 65. Mammogram. This may be done every 1-2 years. Talk to your health care provider about how often you should have regular mammograms. Talk with your health care provider about your test results, treatment options, and if necessary, the need for more tests. Vaccines  Your health care  provider may recommend certain vaccines, such as: Influenza vaccine. This is recommended every year. Tetanus, diphtheria, and acellular pertussis (Tdap, Td) vaccine. You may need a Td booster every 10 years. Zoster vaccine. You may need this after age 22. Pneumococcal 13-valent conjugate (PCV13) vaccine. One dose is recommended after age 22. Pneumococcal polysaccharide (PPSV23) vaccine. One dose is recommended after age 71. Talk to your health care provider about which screenings and vaccines you need and how often you need them. This information is not intended to replace advice given to you by your health care provider. Make sure you discuss any questions you have with your health care provider. Document Released: 03/21/2015 Document Revised: 11/12/2015 Document Reviewed: 12/24/2014 Elsevier Interactive Patient Education  2017 [place] Prevention in the Home Falls can cause injuries. They can happen to people of all ages. There are many things you can do to make your home safe and to help prevent falls. What can I do on the outside of my home? Regularly fix the edges of walkways and driveways and fix any cracks. Remove anything that might make you trip as you walk through a door, such as a raised step or threshold. Trim any bushes or trees on the path to your home. Use bright outdoor lighting. Clear any walking paths of anything that might make someone trip, such as rocks or tools. Regularly check to see if handrails are loose or broken. Make sure that both sides of any steps have handrails. Any raised decks and porches should have guardrails on the edges. Have any leaves, snow, or ice cleared regularly. Use sand or salt on walking paths during winter. Clean up any spills in your garage right away. This includes oil or grease spills. What can I do in the bathroom? Use night lights. Install grab bars by the toilet and in the tub and shower. Do not use towel bars as grab  bars. Use non-skid mats or decals in the tub or shower. If you need to sit down in the shower, use a plastic, non-slip stool. Keep the floor dry. Clean up any water that spills on the floor as soon as it happens. Remove soap buildup in the tub or shower regularly. Attach bath mats securely with double-sided non-slip rug tape. Do not have throw rugs and other things on the floor that can make you trip. What can I do in the bedroom? Use night lights. Make sure that you have a light by your bed that is easy to reach. Do not use any sheets or blankets that are too big for your bed. They should not hang down onto the floor. Have a firm chair that has side arms. You can use this for support while you get dressed. Do not have throw rugs and other things on the floor that can make you trip. What can I do in the kitchen? Clean up any spills right away. Avoid walking on wet floors. Keep items that you use a lot in easy-to-reach places. If you need to reach something above you, use a strong step stool  that has a grab bar. Keep electrical cords out of the way. Do not use floor polish or wax that makes floors slippery. If you must use wax, use non-skid floor wax. Do not have throw rugs and other things on the floor that can make you trip. What can I do with my stairs? Do not leave any items on the stairs. Make sure that there are handrails on both sides of the stairs and use them. Fix handrails that are broken or loose. Make sure that handrails are as long as the stairways. Check any carpeting to make sure that it is firmly attached to the stairs. Fix any carpet that is loose or worn. Avoid having throw rugs at the top or bottom of the stairs. If you do have throw rugs, attach them to the floor with carpet tape. Make sure that you have a light switch at the top of the stairs and the bottom of the stairs. If you do not have them, ask someone to add them for you. What else can I do to help prevent  falls? Wear shoes that: Do not have high heels. Have rubber bottoms. Are comfortable and fit you well. Are closed at the toe. Do not wear sandals. If you use a stepladder: Make sure that it is fully opened. Do not climb a closed stepladder. Make sure that both sides of the stepladder are locked into place. Ask someone to hold it for you, if possible. Clearly mark and make sure that you can see: Any grab bars or handrails. First and last steps. Where the edge of each step is. Use tools that help you move around (mobility aids) if they are needed. These include: Canes. Walkers. Scooters. Crutches. Turn on the lights when you go into a dark area. Replace any light bulbs as soon as they burn out. Set up your furniture so you have a clear path. Avoid moving your furniture around. If any of your floors are uneven, fix them. If there are any pets around you, be aware of where they are. Review your medicines with your doctor. Some medicines can make you feel dizzy. This can increase your chance of falling. Ask your doctor what other things that you can do to help prevent falls. This information is not intended to replace advice given to you by your health care provider. Make sure you discuss any questions you have with your health care provider. Document Released: 12/19/2008 Document Revised: 07/31/2015 Document Reviewed: 03/29/2014 Elsevier Interactive Patient Education  2017 [employer].

## 2022-06-25 NOTE — Progress Notes (Signed)
Subjective:   Tara Martin is a 82 y.o. female who presents for Medicare Annual (Subsequent) preventive examination.  Review of Systems    Cardiac Risk Factors include: advanced age (>38men, >55 women)     Objective:    Today's Vitals   06/25/22 1025  BP: 120/60  Pulse: 74  Temp: 98.2 F (36.8 C)  TempSrc: Oral  SpO2: 96%  Weight: 146 lb (66.2 kg)  Height:  (1.626 m)   Body mass index is 25.06 kg/m.     06/25/2022   10:49 AM 06/19/2021   10:42 AM 02/02/2020    6:53 AM 01/25/2017   11:48 AM 01/25/2017   11:07 AM 02/11/2014    4:20 PM 02/11/2014    9:23 AM  Advanced Directives  Does Patient Have a Medical Advance Directive? No No No Yes Yes Yes Yes  Type of Advance Directive      Living will Living will  Does patient want to make changes to medical advance directive?      No - Patient declined No - Patient declined  Copy of Healthcare Power of Attorney in Chart?      No - copy requested   Would patient like information on creating a medical advance directive? No - Patient declined No - Patient declined         Current Medications (verified) Outpatient Encounter Medications as of 06/25/2022  Medication Sig   ALPRAZolam (XANAX) 0.5 MG tablet Take 0.5 mg by mouth as needed for anxiety.    amphetamine-dextroamphetamine (ADDERALL) 10 MG tablet Take 1 tablet (10 mg total) by mouth daily with breakfast. (Patient not taking: Reported on 12/16/2021)   aspirin 81 MG tablet Take 81 mg by mouth daily.   diclofenac sodium (VOLTAREN) 1 % GEL Apply 3 gm to 3 large joints up to 3 times a day.Dispense 3 tubes with 3 refills.   donepezil (ARICEPT) 10 MG tablet Take 1 tablet (10 mg total) by mouth at bedtime. (Patient not taking: Reported on 06/25/2022)   Multiple Vitamins-Minerals (MULTIVITAMIN WITH MINERALS) tablet Take 1 tablet by mouth daily.   No facility-administered encounter medications on file as of 06/25/2022.    Allergies (verified) Codeine, Penicillins, and Sulfa  antibiotics   History: Past Medical History:  Diagnosis Date   Anxiety state, unspecified - sees Dr. Jennette Kettle in gyn and benzo prescribed there 04/09/2013   Arthritis    Chicken pox    Lupus    MCI (mild cognitive impairment) 05/20/2020   Migraines    Prediabetes    Past Surgical History:  Procedure Laterality Date   BACK SURGERY  2000   CHOLECYSTECTOMY     DILATION AND CURETTAGE OF UTERUS  1973   GALLBLADDER SURGERY  1974   Family History  Problem Relation Age of Onset   Arthritis Mother    Diabetes Mother    Heart disease Mother    Heart attack Mother    Pneumonia Father    COPD Sister    Alzheimer's disease Sister    Heart disease Brother    Pulmonary embolism Brother    Alzheimer's disease Brother    Migraines Daughter    Alcoholism Other    Arthritis Other    Hypertension Other    Sudden death Other    Colon cancer Neg Hx    Esophageal cancer Neg Hx    Stomach cancer Neg Hx    Colon polyps Neg Hx    Social History   Socioeconomic History  Marital status: Married    Spouse name: chuck   Number of children: 1   Years of education: Not on file   Highest education level: Not on file  Occupational History   Occupation: retired  Tobacco Use   Smoking status: Never   Smokeless tobacco: Never  Vaping Use   Vaping Use: Never used  Substance and Sexual Activity   Alcohol use: Not Currently    Alcohol/week: 2.0 standard drinks of alcohol    Types: 2 Glasses of wine per week   Drug use: No   Sexual activity: Not on file  Other Topics Concern   Not on file  Social History Narrative   Lives with husband   Right Handed   Drinks 1 cup maybe every other day   Social Determinants of Health   Financial Resource Strain: Low Risk  (06/25/2022)   Overall Financial Resource Strain (CARDIA)    Difficulty of Paying Living Expenses: Not hard at all  Food Insecurity: No Food Insecurity (06/25/2022)   Hunger Vital Sign    Worried About Running Out of Food in the Last  Year: Never true    Ran Out of Food in the Last Year: Never true  Transportation Needs: No Transportation Needs (06/25/2022)   PRAPARE - Administrator, Civil Service (Medical): No    Lack of Transportation (Non-Medical): No  Physical Activity: Insufficiently Active (06/25/2022)   Exercise Vital Sign    Days of Exercise per Week: 4 days    Minutes of Exercise per Session: 30 min  Stress: No Stress Concern Present (06/25/2022)   [employer] of Occupational Health - Occupational Stress Questionnaire    Feeling of Stress : Not at all  Social Connections: Socially Integrated (06/25/2022)   Social Connection and Isolation Panel [NHANES]    Frequency of Communication with Friends and Family: More than three times a week    Frequency of Social Gatherings with Friends and Family: More than three times a week    Attends Religious Services: More than 4 times per year    Active Member of [employer] or Organizations: Yes    Attends Engineer, structural: More than 4 times per year    Marital Status: Married    Tobacco Counseling Counseling given: Not Answered   Clinical Intake:  Pre-visit preparation completed: Yes  Pain : No/denies pain     BMI - recorded: 25.06 Nutritional Risks: None Diabetes: No  How often do you need to have someone help you when you read instructions, pamphlets, or other written materials from your doctor or pharmacy?: 1 - Never  Diabetic?  No  Interpreter Needed?: No  Information entered by :: Theresa Mulligan LPN   Activities of Daily Living    06/25/2022   10:47 AM  In your present state of health, do you have any difficulty performing the following activities:  Hearing? 0  Vision? 0  Difficulty concentrating or making decisions? 0  Walking or climbing stairs? 0  Dressing or bathing? 0  Doing errands, shopping? 0  Preparing Food and eating ? N  Using the Toilet? N  In the past six months, have you accidently leaked urine? N  Do  you have problems with loss of bowel control? N  Managing your Medications? N  Managing your Finances? N  Housekeeping or managing your Housekeeping? N    Patient Care Team: Kristian Covey, MD as PCP - General (Family Medicine) Pollyann Savoy, MD as Consulting Physician (Rheumatology) Jennette Kettle,  Durward Mallard, MD (Inactive) as Consulting Physician (Obstetrics and Gynecology) Marisue Brooklyn, DO as Referring Physician (Internal Medicine)  Indicate any recent Medical Services you may have received from other than [facility] providers in the past year (date may be approximate).     Assessment:   This is a routine wellness examination for Hazle.  Hearing/Vision screen Hearing Screening - Comments:: Denies hearing difficulties   Vision Screening - Comments:: Wears rx glasses - up to date with routine eye exams with  [facility]  Dietary issues and exercise activities discussed: Exercise limited by: None identified   Goals Addressed               This Visit's Progress     No current goals (pt-stated)         Depression Screen    06/25/2022   10:30 AM 06/19/2021   10:38 AM 12/11/2019    8:31 AM 01/25/2017   11:09 AM 04/23/2014   10:25 AM  PHQ 2/9 Scores  PHQ - 2 Score 0 0 2 0 0  PHQ- 9 Score   4      Fall Risk    06/25/2022   10:48 AM 06/19/2021   10:40 AM 05/12/2020    2:07 PM 12/11/2019    8:31 AM 01/25/2017   11:09 AM  Fall Risk   Falls in the past year? 0 0 1 0 No  Number falls in past yr: 0 0 0 0   Injury with Fall? 0 0 1 0   Comment   ankle lt    Risk for fall due to : No Fall Risks No Fall Risks Impaired mobility No Fall Risks   Follow up Falls prevention discussed  Falls evaluation completed Falls evaluation completed     FALL RISK PREVENTION PERTAINING TO THE HOME:  Any stairs in or around the home? Yes  If so, are there any without handrails? No  Home free of loose throw rugs in walkways, pet beds, electrical cords, etc? Yes  Adequate lighting in your home  to reduce risk of falls? Yes   ASSISTIVE DEVICES UTILIZED TO PREVENT FALLS:  Life alert? No  Use of a cane, walker or w/c? No  Grab bars in the bathroom? Yes Shower chair or bench in shower? No  Elevated toilet seat or a handicapped toilet? No   TIMED UP AND GO:  Was the test performed? Yes .  Length of time to ambulate 10 feet: 10 sec.   Gait steady and fast without use of assistive device  Cognitive Function:    02/26/2021    7:38 AM 05/20/2020    9:18 AM 02/08/2018   12:13 PM  MMSE - Mini Mental State Exam  Orientation to time 5 2 5   Orientation to Place 5 5 4   Registration 3 3 3   Attention/ Calculation 2 5 5   Recall 0 1 1  Language- name 2 objects 2 2 2   Language- repeat 1 1 1   Language- follow 3 step command 3 3 3   Language- read & follow direction 1 1 1   Write a sentence 1 1 1   Copy design 1 1 1   Total score 24 25 27         06/25/2022   10:49 AM 06/19/2021   10:42 AM  6CIT Screen  What Year? 0 points 0 points  What month? 0 points 0 points  What time? 0 points 0 points  Count back from 20 0 points 0 points  Months in reverse  0 points 0 points  Repeat phrase 8 points 0 points  Total Score 8 points 0 points    Immunizations Immunization History  Administered Date(s) Administered   Fluad Quad(high Dose 65+) 11/27/2018, 12/11/2019   Influenza, High Dose Seasonal PF 01/07/2017, 03/13/2018   PFIZER(Purple Top)SARS-COV-2 Vaccination 05/06/2019, 05/30/2019   Pneumococcal Conjugate-13 10/22/2013   Pneumococcal Polysaccharide-23 08/06/2008   Tdap 08/07/2003, 10/22/2013    TDAP status: Up to date  Flu Vaccine status: Up to date  Pneumococcal vaccine status: Up to date  Covid-19 vaccine status: Completed vaccines  Qualifies for Shingles Vaccine? Yes   Zostavax completed No   Shingrix Completed?: No.    Education has been provided regarding the importance of this vaccine. Patient has been advised to call insurance company to determine out of pocket  expense if they have not yet received this vaccine. Advised may also receive vaccine at local pharmacy or Health Dept. Verbalized acceptance and understanding.  Screening Tests Health Maintenance  Topic Date Due   COVID-19 Vaccine (3 - 2023-24 season) 07/11/2022 (Originally 11/06/2021)   Zoster Vaccines- Shingrix (1 of 2) 09/24/2022 (Originally 11/22/1991)   MAMMOGRAM  06/25/2023 (Originally 09/12/2020)   INFLUENZA VACCINE  10/07/2022   Medicare Annual Wellness (AWV)  06/25/2023   DTaP/Tdap/Td (3 - Td or Tdap) 10/23/2023   Pneumonia Vaccine 8+ Years old  Completed   DEXA SCAN  Completed   HPV VACCINES  Aged Out    Health Maintenance  There are no preventive care reminders to display for this patient.   Colorectal cancer screening: No longer required.   Mammogram status: No longer required due to Age.  Bone Density status: Completed 03/23/17. Results reflect: Bone density results: OSTEOPOROSIS. Repeat every   years.  Lung Cancer Screening: (Low Dose CT Chest recommended if Age 28-80 years, 30 pack-year currently smoking OR have quit w/in 15years.) does not qualify.     Additional Screening:  Hepatitis C Screening: does not qualify; Completed   Vision Screening: Recommended annual ophthalmology exams for early detection of glaucoma and other disorders of the eye. Is the patient up to date with their annual eye exam?  Yes  Who is the provider or what is the name of the office in which the patient attends annual eye exams? [facility] If pt is not established with a provider, would they like to be referred to a provider to establish care? No .   Dental Screening: Recommended annual dental exams for proper oral hygiene  Community Resource Referral / Chronic Care Management:  CRR required this visit?  No   CCM required this visit?  No      Plan:     I have personally reviewed and noted the following in the patient's chart:   Medical and social history Use of alcohol,  tobacco or illicit drugs  Current medications and supplements including opioid prescriptions. Patient is not currently taking opioid prescriptions. Functional ability and status Nutritional status Physical activity Advanced directives List of other physicians Hospitalizations, surgeries, and ER visits in previous 12 months Vitals Screenings to include cognitive, depression, and falls Referrals and appointments  In addition, I have reviewed and discussed with patient certain preventive protocols, quality metrics, and best practice recommendations. A written personalized care plan for preventive services as well as general preventive health recommendations were provided to patient.     Tillie Rung, LPN   [id number]   Nurse Notes: None

## 2022-07-13 DIAGNOSIS — Z4689 Encounter for fitting and adjustment of other specified devices: Secondary | ICD-10-CM | POA: Diagnosis not present

## 2022-07-13 DIAGNOSIS — N8182 Incompetence or weakening of pubocervical tissue: Secondary | ICD-10-CM | POA: Diagnosis not present

## 2022-08-09 ENCOUNTER — Ambulatory Visit (INDEPENDENT_AMBULATORY_CARE_PROVIDER_SITE_OTHER): Payer: Medicare HMO | Admitting: Family Medicine

## 2022-08-09 ENCOUNTER — Encounter: Payer: Self-pay | Admitting: Family Medicine

## 2022-08-09 VITALS — BP 126/70 | HR 80 | Temp 97.6°F | Ht 64.0 in | Wt 150.7 lb

## 2022-08-09 DIAGNOSIS — R7303 Prediabetes: Secondary | ICD-10-CM | POA: Diagnosis not present

## 2022-08-09 DIAGNOSIS — G3184 Mild cognitive impairment, so stated: Secondary | ICD-10-CM | POA: Diagnosis not present

## 2022-08-09 LAB — POCT GLYCOSYLATED HEMOGLOBIN (HGB A1C): Hemoglobin A1C: 5.9 % — AB (ref 4.0–5.6)

## 2022-08-09 NOTE — Progress Notes (Signed)
Established Patient Office Visit  Subjective   Patient ID: Tara Martin, female    DOB: 03-Feb-1941  Age: 82 y.o. MRN: [id number]  No chief complaint on file.   HPI   Tara Martin seen for medical follow-up.  She has history of some mild cognitive impairment and has been followed by neurology in the past.  She had a sister who had Alzheimer disease.  She had been on Aricept previously per neurology but took herself off this feeling like this was not making any difference.  She states she is currently not taking any regular medications other than over-the-counter aspirin.  She does not have any history of stroke or cerebrovascular disease or known CAD.  No recent chest pains.  Denies recent falls.  History of prediabetes range blood sugars.  Not checked in over a year.  She has had some recent mild weight gain.  No polyuria or polydipsia.  Past Medical History:  Diagnosis Date   Anxiety state, unspecified - sees Dr. Jennette Kettle in gyn and benzo prescribed there 04/09/2013   Arthritis    Chicken pox    Lupus (HCC)    MCI (mild cognitive impairment) 05/20/2020   Migraines    Prediabetes    Past Surgical History:  Procedure Laterality Date   BACK SURGERY  2000   CHOLECYSTECTOMY     DILATION AND CURETTAGE OF UTERUS  1973   GALLBLADDER SURGERY  1974    reports that she has never smoked. She has never used smokeless tobacco. She reports that she does not currently use alcohol after a past usage of about 2.0 standard drinks of alcohol per week. She reports that she does not use drugs. family history includes Alcoholism in an other family member; Alzheimer's disease in her brother and sister; Arthritis in her mother and another family member; COPD in her sister; Diabetes in her mother; Heart attack in her mother; Heart disease in her brother and mother; Hypertension in an other family member; Migraines in her daughter; Pneumonia in her father; Pulmonary embolism in her brother; Sudden death in an  other family member. Allergies  Allergen Reactions   Codeine     Per patient made her "flip out"   Penicillins Hives   Sulfa Antibiotics Hives    Review of Systems  Constitutional:  Negative for malaise/fatigue.  Eyes:  Negative for blurred vision.  Respiratory:  Negative for shortness of breath.   Cardiovascular:  Negative for chest pain.  Gastrointestinal:  Negative for abdominal pain and blood in stool.  Neurological:  Negative for dizziness, weakness and headaches.      Objective:     BP 126/70 (BP Location: Left Arm, Patient Position: Sitting, Cuff Size: Normal)   Pulse 80   Temp 97.6 F (36.4 C) (Oral)   Ht 5\' 4"  (1.626 m)   Wt 150 lb 11.2 oz (68.4 kg)   SpO2 97%   BMI 25.87 kg/m  BP Readings from Last 3 Encounters:  08/09/22 126/70  06/25/22 120/60  12/16/21 110/64   Wt Readings from Last 3 Encounters:  08/09/22 150 lb 11.2 oz (68.4 kg)  06/25/22 146 lb (66.2 kg)  12/16/21 139 lb (63 kg)      Physical Exam Vitals reviewed.  Constitutional:      Appearance: She is well-developed.  Neck:     Thyroid: No thyromegaly.     Vascular: No JVD.  Cardiovascular:     Rate and Rhythm: Normal rate and regular rhythm.  Heart sounds:     No gallop.  Pulmonary:     Effort: Pulmonary effort is normal. No respiratory distress.     Breath sounds: Normal breath sounds. No wheezing or rales.  Musculoskeletal:     Cervical back: Neck supple.     Right lower leg: No edema.     Left lower leg: No edema.  Neurological:     Mental Status: She is alert.      Results for orders placed or performed in visit on 08/09/22  POC HgB A1c  Result Value Ref Range   Hemoglobin A1C 5.9 (A) 4.0 - 5.6 %   HbA1c POC (<> result, manual entry)     HbA1c, POC (prediabetic range)     HbA1c, POC (controlled diabetic range)      Last hemoglobin A1c Lab Results  Component Value Date   HGBA1C 5.9 (A) 08/09/2022      The ASCVD Risk score (Arnett DK, et al., 2019) failed to  calculate for the following reasons:   The 2019 ASCVD risk score is only valid for ages 60 to 68    Assessment & Plan:   Problem List Items Addressed This Visit       Unprioritized   Prediabetes - Primary   Relevant Orders   POC HgB A1c (Completed)  -She has history of prediabetes with A1c today stable 5.9%.  We have encouraged her to try to reduce high glycemic intake and lose a few pounds.  Increase walking activities. -She is on aspirin and has no clear reason to be on this at age 10.  We have encouraged her to stop aspirin as she has no history of cerebrovascular disease or CAD. -Discussed fall prevention with handout given -Set up routine 48-month follow-up  Return in about 6 months (around 02/08/2023).    Evelena Peat, MD

## 2022-08-30 ENCOUNTER — Encounter: Payer: Medicare HMO | Admitting: Family Medicine

## 2022-09-14 DIAGNOSIS — L820 Inflamed seborrheic keratosis: Secondary | ICD-10-CM | POA: Diagnosis not present

## 2022-10-01 DIAGNOSIS — Z4689 Encounter for fitting and adjustment of other specified devices: Secondary | ICD-10-CM | POA: Diagnosis not present

## 2022-10-01 DIAGNOSIS — N950 Postmenopausal bleeding: Secondary | ICD-10-CM | POA: Diagnosis not present

## 2022-10-01 DIAGNOSIS — N952 Postmenopausal atrophic vaginitis: Secondary | ICD-10-CM | POA: Diagnosis not present

## 2022-10-26 DIAGNOSIS — L720 Epidermal cyst: Secondary | ICD-10-CM | POA: Diagnosis not present

## 2022-10-26 DIAGNOSIS — L718 Other rosacea: Secondary | ICD-10-CM | POA: Diagnosis not present

## 2022-10-29 DIAGNOSIS — Z4689 Encounter for fitting and adjustment of other specified devices: Secondary | ICD-10-CM | POA: Diagnosis not present

## 2023-01-14 DIAGNOSIS — Z4689 Encounter for fitting and adjustment of other specified devices: Secondary | ICD-10-CM | POA: Diagnosis not present

## 2023-01-18 DIAGNOSIS — L821 Other seborrheic keratosis: Secondary | ICD-10-CM | POA: Diagnosis not present

## 2023-01-18 DIAGNOSIS — L820 Inflamed seborrheic keratosis: Secondary | ICD-10-CM | POA: Diagnosis not present

## 2023-01-18 DIAGNOSIS — D225 Melanocytic nevi of trunk: Secondary | ICD-10-CM | POA: Diagnosis not present

## 2023-02-14 ENCOUNTER — Ambulatory Visit (INDEPENDENT_AMBULATORY_CARE_PROVIDER_SITE_OTHER): Payer: Medicare HMO | Admitting: Family Medicine

## 2023-02-14 ENCOUNTER — Encounter: Payer: Self-pay | Admitting: Family Medicine

## 2023-02-14 VITALS — BP 142/68 | HR 84 | Temp 98.3°F | Ht 64.0 in | Wt 149.8 lb

## 2023-02-14 DIAGNOSIS — R7303 Prediabetes: Secondary | ICD-10-CM | POA: Diagnosis not present

## 2023-02-14 DIAGNOSIS — S86891A Other injury of other muscle(s) and tendon(s) at lower leg level, right leg, initial encounter: Secondary | ICD-10-CM | POA: Diagnosis not present

## 2023-02-14 DIAGNOSIS — R030 Elevated blood-pressure reading, without diagnosis of hypertension: Secondary | ICD-10-CM | POA: Diagnosis not present

## 2023-02-14 DIAGNOSIS — G3184 Mild cognitive impairment, so stated: Secondary | ICD-10-CM | POA: Diagnosis not present

## 2023-02-14 NOTE — Progress Notes (Signed)
a 

## 2023-02-14 NOTE — Patient Instructions (Signed)
Check BP at home and be in touch if consistently > 140 systolic (top number).   I will setting up neurology referral

## 2023-02-14 NOTE — Progress Notes (Signed)
Established Patient Office Visit  Subjective   Patient ID: Tara Martin, female    DOB: 16-Oct-1940  Age: 82 y.o. MRN: [id number]  Chief Complaint  Patient presents with   Leg Pain    Patient complains of right leg pain, x5 days     HPI   Tara Martin is seen today for several things as follows  Past medical history is reviewed and she has history of mild cognitive impairment which is been followed by neurology, history of prediabetes.  She has Aricept listed on her medications but apparently has not been taking this.  She has not been seen by neurologist in quite some time.  Has previously had MRI and evaluation per neurology.  She does not folic her memory is much of an issue but her husband's had concerns.  She has acute issue of right shin pain.  Denies injury.  Tolerating walking fairly well.  Has intermittent soreness along the medial aspect of the tibia diffusely.  Sore to touch at times and occasionally feels warm to touch.  No erythema.  Denies any specific injury.  Does walk frequently for exercise  Elevated blood pressure today.  No reported history of hypertension.  Has home cuff but not monitoring.  Denies any recent headaches or dizziness.  Very strong family history of dementia reportedly in 4 out of 8 siblings.  She has previously been prescribed Aricept but apparently not taking.  Past Medical History:  Diagnosis Date   Anxiety state, unspecified - sees Tara Martin in gyn and benzo prescribed there 04/09/2013   Arthritis    Chicken pox    Lupus    MCI (mild cognitive impairment) 05/20/2020   Migraines    Prediabetes    Past Surgical History:  Procedure Laterality Date   BACK SURGERY  2000   CHOLECYSTECTOMY     DILATION AND CURETTAGE OF UTERUS  1973   GALLBLADDER SURGERY  1974    reports that she has never smoked. She has never used smokeless tobacco. She reports that she does not currently use alcohol after a past usage of about 2.0 standard drinks of alcohol  per week. She reports that she does not use drugs. family history includes Alcoholism in an other family member; Alzheimer's disease in her brother and sister; Arthritis in her mother and another family member; COPD in her sister; Diabetes in her mother; Heart attack in her mother; Heart disease in her brother and mother; Hypertension in an other family member; Migraines in her daughter; Pneumonia in her father; Pulmonary embolism in her brother; Sudden death in an other family member. Allergies  Allergen Reactions   Codeine     Per patient made her "flip out"   Penicillins Hives   Sulfa Antibiotics Hives    Review of Systems  Constitutional:  Negative for chills, fever and malaise/fatigue.  Eyes:  Negative for blurred vision.  Respiratory:  Negative for shortness of breath.   Cardiovascular:  Negative for chest pain.  Genitourinary:  Negative for dysuria.  Neurological:  Negative for dizziness, weakness and headaches.      Objective:     BP (!) 142/68 (BP Location: Left Arm, Cuff Size: Normal)   Pulse 84   Temp 98.3 F (36.8 C) (Oral)   Ht 5\' 4"  (1.626 m)   Wt 149 lb 12.8 oz (67.9 kg)   SpO2 95%   BMI 25.71 kg/m  BP Readings from Last 3 Encounters:  02/14/23 (!) 142/68  08/09/22 126/70  06/25/22  120/60   Wt Readings from Last 3 Encounters:  02/14/23 149 lb 12.8 oz (67.9 kg)  08/09/22 150 lb 11.2 oz (68.4 kg)  06/25/22 146 lb (66.2 kg)      Physical Exam Vitals reviewed.  Constitutional:      Appearance: She is well-developed.  Eyes:     Pupils: Pupils are equal, round, and reactive to light.  Neck:     Thyroid: No thyromegaly.     Vascular: No JVD.  Cardiovascular:     Rate and Rhythm: Normal rate and regular rhythm.     Heart sounds:     No gallop.  Pulmonary:     Effort: Pulmonary effort is normal. No respiratory distress.     Breath sounds: Normal breath sounds. No wheezing or rales.  Musculoskeletal:     Cervical back: Neck supple.     Comments:  Right leg reveals some mild tenderness along the mid to lower third portion of the medial tibia.  Not localized to 1 area.  Minimally tender to palpation.  No edema.  No calf tenderness.  No erythema.  Neurological:     General: No focal deficit present.     Mental Status: She is alert.  Psychiatric:     Comments: She struggled a bit with the date.  No difficulty with remembering the month but had difficulty with date      No results found for any visits on 02/14/23.  Last CBC Lab Results  Component Value Date   WBC 3.3 (L) 12/11/2019   HGB 12.1 12/11/2019   HCT 36.7 12/11/2019   MCV 89.3 12/11/2019   MCH 29.4 12/11/2019   RDW 13.3 12/11/2019   PLT 297 12/11/2019   Last metabolic panel Lab Results  Component Value Date   GLUCOSE 109 (H) 12/11/2019   NA 137 12/11/2019   K 4.3 12/11/2019   CL 105 12/11/2019   CO2 24 12/11/2019   BUN 13 12/11/2019   CREATININE 0.84 12/11/2019   GFR 59.21 (L) 10/18/2018   CALCIUM 9.5 12/11/2019   PROT 7.3 05/12/2020   ALBUMIN 4.2 11/05/2014   BILITOT 0.4 12/11/2019   ALKPHOS 74 11/05/2014   AST 22 12/11/2019   ALT 15 12/11/2019   ANIONGAP 14 02/12/2014   Last lipids Lab Results  Component Value Date   CHOL 190 12/11/2019   HDL 33 (L) 12/11/2019   LDLCALC 129 (H) 12/11/2019   TRIG 166 (H) 12/11/2019   CHOLHDL 5.8 (H) 12/11/2019   Last hemoglobin A1c Lab Results  Component Value Date   HGBA1C 5.9 (A) 08/09/2022   Last thyroid functions Lab Results  Component Value Date   TSH 4.16 12/11/2019   Last vitamin B12 and Folate Lab Results  Component Value Date   VITAMINB12 >1506 (H) 05/12/2020      The ASCVD Risk score (Arnett DK, et al., 2019) failed to calculate for the following reasons:   The 2019 ASCVD risk score is only valid for ages 84 to 35    Assessment & Plan:   #1 right leg pain.  Suspect shinsplints.  She has diffuse tenderness of somewhat to palpation.  No visible swelling.  No localizing tenderness.  We  recommend she try some icing especially after walking.  Also make sure she has good cushioned shoe wear.  We also suggest that she try some topical Voltaren gel to apply as needed  #2 elevated blood pressure without diagnosis of hypertension.  Blood pressure did come down considerably with repeat.  Recommend home monitoring.  Be in touch if systolic consistently over 140.  Recommend office follow-up in 2 months.  Handout on DASH diet given.  #3 history of cognitive impairment.  Strong family history of dementia.  We strongly advised that she try to get back into see neurology again whom she has seen in the past.  We placed repeat referral.  She has not been taking her Aricept  #4 history of impaired glucose tolerance.  Consider repeat A1c at follow-up  Evelena Peat, MD

## 2023-02-24 DIAGNOSIS — N952 Postmenopausal atrophic vaginitis: Secondary | ICD-10-CM | POA: Diagnosis not present

## 2023-02-24 DIAGNOSIS — N760 Acute vaginitis: Secondary | ICD-10-CM | POA: Diagnosis not present

## 2023-02-24 DIAGNOSIS — N819 Female genital prolapse, unspecified: Secondary | ICD-10-CM | POA: Diagnosis not present

## 2023-02-24 DIAGNOSIS — N950 Postmenopausal bleeding: Secondary | ICD-10-CM | POA: Diagnosis not present

## 2023-02-25 DIAGNOSIS — R1030 Lower abdominal pain, unspecified: Secondary | ICD-10-CM | POA: Diagnosis not present

## 2023-03-07 ENCOUNTER — Telehealth: Payer: Self-pay | Admitting: Family Medicine

## 2023-03-07 NOTE — Telephone Encounter (Signed)
Pt wife was called and I provider her with the below info as will as phone number and address

## 2023-03-07 NOTE — Telephone Encounter (Addendum)
Pt husband is aware the appt with dr dohmeier [facility] is 05-25-2023 2pm and I  will call wife back to provide with phone number to be put on wait list. Pt husband mention she suppose to see sarah slack. Please advise

## 2023-03-07 NOTE — Telephone Encounter (Signed)
Copied from CRM [id number]. Topic: Referral - Status >> Mar 07, 2023 10:50 AM Elita Quick wrote: Reason for CRM: PATIENT HUSBAND MR CHARLES CALLED IN TO CHECK ON REFERRAL STATUS FOR COGNITIVE ISSUES WITH HIS WIFE. HE STATES ITS BEEN 3 WEEKS AND HE HAS NOT HEARD BACK. WANTS TO KNOW THE STATUS OF THE REFERRAL

## 2023-03-09 ENCOUNTER — Emergency Department (HOSPITAL_BASED_OUTPATIENT_CLINIC_OR_DEPARTMENT_OTHER): Payer: Medicare HMO

## 2023-03-09 ENCOUNTER — Other Ambulatory Visit: Payer: Self-pay

## 2023-03-09 ENCOUNTER — Inpatient Hospital Stay (HOSPITAL_BASED_OUTPATIENT_CLINIC_OR_DEPARTMENT_OTHER)
Admission: EM | Admit: 2023-03-09 | Discharge: 2023-03-11 | DRG: 175 | Disposition: A | Discharge status: Home / Self Care | Payer: Medicare HMO | Attending: Internal Medicine | Admitting: Internal Medicine

## 2023-03-09 DIAGNOSIS — I82403 Acute embolism and thrombosis of unspecified deep veins of lower extremity, bilateral: Secondary | ICD-10-CM | POA: Diagnosis present

## 2023-03-09 DIAGNOSIS — R1032 Left lower quadrant pain: Secondary | ICD-10-CM | POA: Diagnosis not present

## 2023-03-09 DIAGNOSIS — R0989 Other specified symptoms and signs involving the circulatory and respiratory systems: Secondary | ICD-10-CM | POA: Diagnosis not present

## 2023-03-09 DIAGNOSIS — Z79899 Other long term (current) drug therapy: Secondary | ICD-10-CM

## 2023-03-09 DIAGNOSIS — Z882 Allergy status to sulfonamides status: Secondary | ICD-10-CM

## 2023-03-09 DIAGNOSIS — R54 Age-related physical debility: Secondary | ICD-10-CM | POA: Diagnosis present

## 2023-03-09 DIAGNOSIS — R0602 Shortness of breath: Secondary | ICD-10-CM | POA: Diagnosis not present

## 2023-03-09 DIAGNOSIS — I2699 Other pulmonary embolism without acute cor pulmonale: Secondary | ICD-10-CM | POA: Diagnosis not present

## 2023-03-09 DIAGNOSIS — J90 Pleural effusion, not elsewhere classified: Secondary | ICD-10-CM | POA: Diagnosis not present

## 2023-03-09 DIAGNOSIS — Z885 Allergy status to narcotic agent status: Secondary | ICD-10-CM

## 2023-03-09 DIAGNOSIS — I10 Essential (primary) hypertension: Secondary | ICD-10-CM | POA: Diagnosis present

## 2023-03-09 DIAGNOSIS — R918 Other nonspecific abnormal finding of lung field: Secondary | ICD-10-CM | POA: Diagnosis not present

## 2023-03-09 DIAGNOSIS — Z87440 Personal history of urinary (tract) infections: Secondary | ICD-10-CM

## 2023-03-09 DIAGNOSIS — M329 Systemic lupus erythematosus, unspecified: Secondary | ICD-10-CM | POA: Diagnosis present

## 2023-03-09 DIAGNOSIS — R410 Disorientation, unspecified: Secondary | ICD-10-CM | POA: Diagnosis not present

## 2023-03-09 DIAGNOSIS — E871 Hypo-osmolality and hyponatremia: Secondary | ICD-10-CM | POA: Diagnosis not present

## 2023-03-09 DIAGNOSIS — G43909 Migraine, unspecified, not intractable, without status migrainosus: Secondary | ICD-10-CM | POA: Diagnosis present

## 2023-03-09 DIAGNOSIS — R7303 Prediabetes: Secondary | ICD-10-CM | POA: Diagnosis present

## 2023-03-09 DIAGNOSIS — G3184 Mild cognitive impairment, so stated: Secondary | ICD-10-CM | POA: Diagnosis present

## 2023-03-09 DIAGNOSIS — R109 Unspecified abdominal pain: Secondary | ICD-10-CM | POA: Diagnosis not present

## 2023-03-09 DIAGNOSIS — Z880 Allergy status to penicillin: Secondary | ICD-10-CM

## 2023-03-09 DIAGNOSIS — Z1152 Encounter for screening for COVID-19: Secondary | ICD-10-CM

## 2023-03-09 DIAGNOSIS — J9601 Acute respiratory failure with hypoxia: Secondary | ICD-10-CM | POA: Diagnosis present

## 2023-03-09 DIAGNOSIS — R230 Cyanosis: Secondary | ICD-10-CM | POA: Diagnosis present

## 2023-03-09 DIAGNOSIS — R000 Tachycardia, unspecified: Secondary | ICD-10-CM | POA: Diagnosis present

## 2023-03-09 DIAGNOSIS — D638 Anemia in other chronic diseases classified elsewhere: Secondary | ICD-10-CM | POA: Diagnosis present

## 2023-03-09 DIAGNOSIS — N3289 Other specified disorders of bladder: Secondary | ICD-10-CM | POA: Diagnosis not present

## 2023-03-09 DIAGNOSIS — I2609 Other pulmonary embolism with acute cor pulmonale: Principal | ICD-10-CM

## 2023-03-09 DIAGNOSIS — Z6825 Body mass index (BMI) 25.0-25.9, adult: Secondary | ICD-10-CM

## 2023-03-09 DIAGNOSIS — Z833 Family history of diabetes mellitus: Secondary | ICD-10-CM

## 2023-03-09 DIAGNOSIS — Z8261 Family history of arthritis: Secondary | ICD-10-CM

## 2023-03-09 DIAGNOSIS — Z8249 Family history of ischemic heart disease and other diseases of the circulatory system: Secondary | ICD-10-CM

## 2023-03-09 DIAGNOSIS — R079 Chest pain, unspecified: Secondary | ICD-10-CM | POA: Diagnosis not present

## 2023-03-09 DIAGNOSIS — R590 Localized enlarged lymph nodes: Secondary | ICD-10-CM | POA: Diagnosis present

## 2023-03-09 DIAGNOSIS — Z820 Family history of epilepsy and other diseases of the nervous system: Secondary | ICD-10-CM

## 2023-03-09 DIAGNOSIS — F411 Generalized anxiety disorder: Secondary | ICD-10-CM | POA: Diagnosis present

## 2023-03-09 DIAGNOSIS — Z825 Family history of asthma and other chronic lower respiratory diseases: Secondary | ICD-10-CM

## 2023-03-09 DIAGNOSIS — R509 Fever, unspecified: Secondary | ICD-10-CM | POA: Diagnosis not present

## 2023-03-09 LAB — I-STAT VENOUS BLOOD GAS, ED
Acid-base deficit: 5 mmol/L — ABNORMAL HIGH (ref 0.0–2.0)
Bicarbonate: 18 mmol/L — ABNORMAL LOW (ref 20.0–28.0)
Calcium, Ion: 1.13 mmol/L — ABNORMAL LOW (ref 1.15–1.40)
HCT: 28 % — ABNORMAL LOW (ref 36.0–46.0)
Hemoglobin: 9.5 g/dL — ABNORMAL LOW (ref 12.0–15.0)
O2 Saturation: 90 %
Patient temperature: 99.8
Potassium: 4.2 mmol/L (ref 3.5–5.1)
Sodium: 128 mmol/L — ABNORMAL LOW (ref 135–145)
TCO2: 19 mmol/L — ABNORMAL LOW (ref 22–32)
pCO2, Ven: 25.8 mm[Hg] — ABNORMAL LOW (ref 44–60)
pH, Ven: 7.454 — ABNORMAL HIGH (ref 7.25–7.43)
pO2, Ven: 56 mm[Hg] — ABNORMAL HIGH (ref 32–45)

## 2023-03-09 LAB — CBC WITH DIFFERENTIAL/PLATELET
Abs Immature Granulocytes: 0.13 10*3/uL — ABNORMAL HIGH (ref 0.00–0.07)
Basophils Absolute: 0 10*3/uL (ref 0.0–0.1)
Basophils Relative: 0 %
Eosinophils Absolute: 0.1 10*3/uL (ref 0.0–0.5)
Eosinophils Relative: 1 %
HCT: 32.7 % — ABNORMAL LOW (ref 36.0–46.0)
Hemoglobin: 10.8 g/dL — ABNORMAL LOW (ref 12.0–15.0)
Immature Granulocytes: 2 %
Lymphocytes Relative: 5 %
Lymphs Abs: 0.5 10*3/uL — ABNORMAL LOW (ref 0.7–4.0)
MCH: 28.7 pg (ref 26.0–34.0)
MCHC: 33 g/dL (ref 30.0–36.0)
MCV: 87 fL (ref 80.0–100.0)
Monocytes Absolute: 0.7 10*3/uL (ref 0.1–1.0)
Monocytes Relative: 8 %
Neutro Abs: 7.3 10*3/uL (ref 1.7–7.7)
Neutrophils Relative %: 84 %
Platelets: 239 10*3/uL (ref 150–400)
RBC: 3.76 MIL/uL — ABNORMAL LOW (ref 3.87–5.11)
RDW: 12.4 % (ref 11.5–15.5)
WBC: 8.6 10*3/uL (ref 4.0–10.5)
nRBC: 0 % (ref 0.0–0.2)

## 2023-03-09 LAB — COMPREHENSIVE METABOLIC PANEL
ALT: 52 U/L — ABNORMAL HIGH (ref 0–44)
AST: 45 U/L — ABNORMAL HIGH (ref 15–41)
Albumin: 3.6 g/dL (ref 3.5–5.0)
Alkaline Phosphatase: 78 U/L (ref 38–126)
Anion gap: 12 (ref 5–15)
BUN: 21 mg/dL (ref 8–23)
CO2: 21 mmol/L — ABNORMAL LOW (ref 22–32)
Calcium: 8.3 mg/dL — ABNORMAL LOW (ref 8.9–10.3)
Chloride: 97 mmol/L — ABNORMAL LOW (ref 98–111)
Creatinine, Ser: 0.81 mg/dL (ref 0.44–1.00)
GFR, Estimated: >60 mL/min (ref >60)
Glucose, Bld: 120 mg/dL — ABNORMAL HIGH (ref 70–99)
Potassium: 4.3 mmol/L (ref 3.5–5.1)
Sodium: 130 mmol/L — ABNORMAL LOW (ref 135–145)
Total Bilirubin: 0.4 mg/dL (ref 0.0–1.2)
Total Protein: 6.7 g/dL (ref 6.5–8.1)

## 2023-03-09 LAB — PROCALCITONIN: Procalcitonin: 0.15 ng/mL

## 2023-03-09 LAB — TROPONIN I (HIGH SENSITIVITY)
Troponin I (High Sensitivity): 47 ng/L — ABNORMAL HIGH (ref <18)
Troponin I (High Sensitivity): 132 ng/L (ref <18)
Troponin I (High Sensitivity): 139 ng/L (ref <18)

## 2023-03-09 LAB — RESP PANEL BY RT-PCR (RSV, FLU A&B, COVID)  RVPGX2
Influenza A by PCR: NEGATIVE
Influenza B by PCR: NEGATIVE
Resp Syncytial Virus by PCR: NEGATIVE
SARS Coronavirus 2 by RT PCR: NEGATIVE

## 2023-03-09 LAB — LACTIC ACID, PLASMA
Lactic Acid, Venous: 1.2 mmol/L (ref 0.5–1.9)
Lactic Acid, Venous: 1.7 mmol/L (ref 0.5–1.9)

## 2023-03-09 MED ORDER — IOHEXOL 350 MG/ML SOLN
100.0000 mL | Freq: Once | INTRAVENOUS | Status: AC | PRN
  Administered 2023-03-09: 80 mL via INTRAVENOUS

## 2023-03-09 MED ORDER — VANCOMYCIN HCL IN DEXTROSE 1-5 GM/200ML-% IV SOLN: 1000.0000 mg | Freq: Once | INTRAVENOUS | Status: DC

## 2023-03-09 MED ORDER — SODIUM CHLORIDE 0.9 % IV BOLUS
1000.0000 mL | Freq: Once | INTRAVENOUS | Status: AC
  Administered 2023-03-09: 1000 mL via INTRAVENOUS

## 2023-03-09 MED ORDER — LACTATED RINGERS IV SOLN: INTRAVENOUS | Status: DC

## 2023-03-09 MED ORDER — HEPARIN (PORCINE) 25000 UT/250ML-% IV SOLN: 1050.0000 [IU]/h | INTRAVENOUS | Status: DC

## 2023-03-09 MED ORDER — METRONIDAZOLE 500 MG/100ML IV SOLN: 500.0000 mg | Freq: Once | INTRAVENOUS | Status: DC

## 2023-03-09 MED ORDER — ONDANSETRON HCL 4 MG/2ML IJ SOLN
4.0000 mg | Freq: Once | INTRAMUSCULAR | Status: AC
  Administered 2023-03-09: 4 mg via INTRAVENOUS
  Filled 2023-03-09: qty 2

## 2023-03-09 MED ORDER — IBUPROFEN 800 MG PO TABS
800.0000 mg | ORAL_TABLET | Freq: Once | ORAL | Status: AC
  Administered 2023-03-09: 800 mg via ORAL
  Filled 2023-03-09: qty 1

## 2023-03-09 MED ORDER — HEPARIN (PORCINE) 25000 UT/250ML-% IV SOLN
1050.0000 [IU]/h | INTRAVENOUS | Status: AC
  Administered 2023-03-09: 1050 [IU]/h via INTRAVENOUS
  Filled 2023-03-09 (×2): qty 250

## 2023-03-09 MED ORDER — HEPARIN BOLUS VIA INFUSION: 4500.0000 [IU] | Freq: Once | INTRAVENOUS | Status: DC

## 2023-03-09 MED ORDER — HEPARIN BOLUS VIA INFUSION
4500.0000 [IU] | Freq: Once | INTRAVENOUS | Status: AC
  Administered 2023-03-09: 4500 [IU] via INTRAVENOUS

## 2023-03-09 MED ORDER — FENTANYL CITRATE PF 50 MCG/ML IJ SOSY
50.0000 ug | PREFILLED_SYRINGE | Freq: Once | INTRAMUSCULAR | Status: AC
  Administered 2023-03-09: 50 ug via INTRAVENOUS
  Filled 2023-03-09: qty 1

## 2023-03-09 MED ORDER — SODIUM CHLORIDE 0.9 % IV SOLN: 2.0000 g | Freq: Once | INTRAVENOUS | Status: DC

## 2023-03-09 NOTE — ED Provider Notes (Signed)
   Received signout from previous provider, please see her note for complete H&P.  This is an 83 year old female presenting to the ED with shortness of breath and confusion.  Unsure how long was the duration of her symptoms as her daughter was recently and came to visit her from [place] of state and her husband is blind.  Daughter did not notice that patient appears to be more confused in the past few days and seems to be having some trouble with breathing and complaining of pain when she breathes.  When patient arrived, she was hypoxic requiring 4 L of oxygen to maintain O2 sats above 90%.  Workup today is remarkable for a chest CTA showing evidence of bilateral segmental PE with right heart strain and a spiculated mass on the right lung concerning for pulmonary neoplasm.  Patient initially has a fever as high as 103 but currently no obvious signs of infection were noted.  Code PE was initially initiated.  I have consulted critical care specialist who does not think patient need thrombolysis at this time.  He recommends starting heparin , and check a procalcitonin and have patient admitted to medicine team for further workup.  I spent time to discuss the finding with patient and with daughter who is now at bedside.  At this time patient appears more clinically stable but still quite delirious.   Physical Exam  BP 108/69   Pulse (!) 124   Temp (!) 102.9 F (39.4 C) (Oral)   Resp (!) 23   Wt 64 kg   SpO2 92%   BMI 24.20 kg/m   Physical Exam Patient wearing supplemental oxygen, appears tired.  Heart with normal rate and rhythm, lungs with crackles noted on lung bases, abdomen soft nontender no peripheral edema noted  Procedures  Procedures  ED Course / MDM   Clinical Course as of 03/09/23 2029  Wed Mar 09, 2023  1856 Troponin I (High Sensitivity)(!): 47 [AH]  1939 Pulse Rate(!): 124 In course of my evaluation I removed the patient's oxygen to check a baseline.  She immediately went from 96% to  83% on room air.  It took her a significant amount of time to bring her oxygen saturations back up.  She is currently on 4 L via nasal cannula. [AH]    Clinical Course User Index [AH] Arloa Chroman, PA-C   Medical Decision Making Amount and/or Complexity of Data Reviewed Labs: ordered. Decision-making details documented in ED Course. Radiology: ordered.  Risk Prescription drug management. Decision regarding hospitalization.          Nivia Colon, PA-C 03/10/23 2332    Geraldene Hamilton, MD 03/11/23 [phone]

## 2023-03-09 NOTE — ED Provider Notes (Signed)
 [place] EMERGENCY DEPARTMENT AT [facility] Provider Note   CSN: [id number] Arrival date & time: 03/09/23  1630     History  No chief complaint on file.   Tara Martin is a 83 y.o. female who presents emergency department with chief complaint of confusion and shortness of breath.  History is given by the patient's order at bedside.  Patient is an 83 year old female with a past medical history of mild cognitive impairment, lupus, prediabetes.  Patient's daughter reports that her father says that on Saturday he noticed an acute change in the patient's mentation.  Sunday she took him to church but did not go because she was having stomach issues.  Patient's daughter came to visit from out of town on Monday.  The patient has been noticeably less active but still continuing to do some activities of daily living.  She has been eating and drinking less and resting more.  She has been complaining of pain on the left side of her chest and abdomen which appears to be sharp, worse with breathing.  Her daughter is noticed that she has been asking questions repetitively and has been more and more confused over the past few days.  Patient has been refusing to go to an urgent care however today agreed and she states that when they got there she noticed that she was too weak to get out of the car and by the time they got into the urgent care her nose face and lips were blue and purple and her fingers were purple.  Apparently they were unable to obtain a pulse ox and so let the patient go by POV to an emergency department.  Upon arrival patient was noted to be hypoxic with central cyanosis.  Initial temperature 100.  Patient now requiring 4 L to maintain an oxygen saturation above 90%.  She has no previous history of hypoxia.  She has repetitive questioning continues to complain of pain on the left side.  She had a recent urinary tract infection about 2 weeks ago and completed a course of antibiotics.   Daughter does not which one.  HPI     Home Medications Prior to Admission medications   Medication Sig Start Date End Date Taking? Authorizing Provider  ALPRAZolam  (XANAX ) 0.5 MG tablet Take 0.5 mg by mouth as needed for anxiety.     Rosalynn LELON Ingle, MD  diclofenac  sodium (VOLTAREN ) 1 % GEL Apply 3 gm to 3 large joints up to 3 times a day.Dispense 3 tubes with 3 refills. 02/18/17   Dolphus Reiter, MD  donepezil  (ARICEPT ) 10 MG tablet Take 1 tablet (10 mg total) by mouth at bedtime. 10/05/21   Burchette, Wolm LELON, MD  Multiple Vitamins-Minerals (MULTIVITAMIN WITH MINERALS) tablet Take 1 tablet by mouth daily.    [provider]      Allergies    Codeine, Penicillins, and Sulfa antibiotics    Review of Systems   Review of Systems  Physical Exam Updated Vital Signs BP 108/69   Pulse (!) 124   Temp (!) 102.9 F (39.4 C) (Oral)   Resp (!) 23   Wt 64 kg   SpO2 92%   BMI 24.20 kg/m  Physical Exam Vitals and nursing note reviewed.  Constitutional:      General: She is not in acute distress.    Appearance: She is well-developed. She is ill-appearing. She is not diaphoretic.  HENT:     Head: Normocephalic and atraumatic.     Right  Ear: External ear normal.     Left Ear: External ear normal.     Nose: Nose normal.     Mouth/Throat:     Mouth: Mucous membranes are moist.  Eyes:     General: No scleral icterus.    Conjunctiva/sclera: Conjunctivae normal.  Cardiovascular:     Rate and Rhythm: Normal rate and regular rhythm.     Heart sounds: Normal heart sounds. No murmur heard.    No friction rub. No gallop.  Pulmonary:     Effort: Pulmonary effort is normal. Tachypnea present. No accessory muscle usage or respiratory distress.     Breath sounds: Normal breath sounds. Decreased air movement present.     Comments: Has guarding and splinting with deep breath. Abdominal:     General: Bowel sounds are normal. There is no distension.     Palpations: Abdomen is soft.  There is no mass.     Tenderness: There is no abdominal tenderness. There is no guarding.  Musculoskeletal:     Cervical back: Normal range of motion.  Skin:    General: Skin is warm and dry.  Neurological:     Mental Status: She is alert and oriented to person, place, and time.  Psychiatric:        Behavior: Behavior normal.     ED Results / Procedures / Treatments   Labs (all labs ordered are listed, but only abnormal results are displayed) Labs Reviewed  COMPREHENSIVE METABOLIC PANEL - Abnormal; Notable for the following components:      Result Value   Sodium 130 (*)    Chloride 97 (*)    CO2 21 (*)    Glucose, Bld 120 (*)    Calcium  8.3 (*)    AST 45 (*)    ALT 52 (*)    All other components within normal limits  CBC WITH DIFFERENTIAL/PLATELET - Abnormal; Notable for the following components:   RBC 3.76 (*)    Hemoglobin 10.8 (*)    HCT 32.7 (*)    Lymphs Abs 0.5 (*)    Abs Immature Granulocytes 0.13 (*)    All other components within normal limits  TROPONIN I (HIGH SENSITIVITY) - Abnormal; Notable for the following components:   Troponin I (High Sensitivity) 47 (*)    All other components within normal limits  RESP PANEL BY RT-PCR (RSV, FLU A&B, COVID)  RVPGX2  LACTIC ACID, PLASMA  LACTIC ACID, PLASMA  URINALYSIS, W/ REFLEX TO CULTURE (INFECTION SUSPECTED)  TROPONIN I (HIGH SENSITIVITY)    EKG None  Radiology DG Chest Port 1 View Result Date: 03/09/2023 CLINICAL DATA:  10026 Shortness of breath 10026 EXAM: PORTABLE CHEST 1 VIEW COMPARISON:  02/11/2014 chest radiograph. FINDINGS: Low lung volumes. Stable cardiomediastinal silhouette with top-normal heart size. No pneumothorax. Small left pleural effusion. No right pleural effusion. No pulmonary edema. No consolidative airspace disease. IMPRESSION: Low lung volumes. Small left pleural effusion. Electronically Signed   By: Selinda DELENA Blue M.D.   On: 03/09/2023 17:34    Procedures .Critical Care  Performed by:  Arloa Chroman, PA-C Authorized by: Arloa Chroman, PA-C   Critical care provider statement:    Critical care time (minutes):  75   Critical care time was exclusive of:  Separately billable procedures and treating other patients   Critical care was necessary to treat or prevent imminent or life-threatening deterioration of the following conditions:  Respiratory failure   Critical care was time spent personally by me on the following activities:  Development  of treatment plan with patient or surrogate, discussions with consultants, evaluation of patient's response to treatment, examination of patient, ordering and review of laboratory studies, ordering and review of radiographic studies, ordering and performing treatments and interventions, pulse oximetry, re-evaluation of patient's condition and review of old charts     Medications Ordered in ED Medications  iohexol  (OMNIPAQUE ) 350 MG/ML injection 100 mL (has no administration in time range)  ibuprofen  (ADVIL ) tablet 800 mg (800 mg Oral Given 03/09/23 1839)  sodium chloride  0.9 % bolus 1,000 mL (1,000 mLs Intravenous New Bag/Given 03/09/23 1848)  fentaNYL  (SUBLIMAZE ) injection 50 mcg (50 mcg Intravenous Given 03/09/23 1848)  ondansetron  (ZOFRAN ) injection 4 mg (4 mg Intravenous Given 03/09/23 1848)    ED Course/ Medical Decision Making/ A&P Clinical Course as of 03/12/23 1620  Wed Mar 09, 2023  1856 Troponin I (High Sensitivity)(!): 47 [AH]  1940 Pulse Rate(!): 124 In course of my evaluation I removed the patient's oxygen to check a baseline.  She immediately went from 96% to 83% on room air.  It took her a significant amount of time to bring her oxygen saturations back up.  She is currently on 4 L via nasal cannula. [AH]    Clinical Course User Index [AH] Arloa Chroman, PA-C                                 Medical Decision Making Amount and/or Complexity of Data Reviewed Labs: ordered. Decision-making details documented in ED  Course. Radiology: ordered.  Risk Prescription drug management. Decision regarding hospitalization.   This patient presents to the ED with chief complaint(s) of altered mental status shortness of breath with pertinent past medical history of mild cognitive impairment which further complicates the presenting complaint. The complaint involves an extensive differential diagnosis and treatment options and also carries with it a high risk of complications and morbidity.    The differential diagnosis includes torrential diagnosis includes pneumonia, viral infection, pulmonary embolus, pleural effusion, perforated diverticulitis or intra-abdominal pathology less likely as she has benign abdominal exam.  Patient has worsening fever tachypnea and hypoxia with an elevated troponin.  The initial plan is to order CT chest abdomen and pelvis with PE study and to treat patient with antipyretics, pain medications and nausea medication for pain and fever    Additional history obtained: Additional history obtained from family Records reviewed Care Everywhere/External Records  Reassessment and review (also see workup area): Lab Tests: I Ordered, and personally interpreted labs.  The pertinent results include:   Troponin 47 <<132 NA 132 GLU 120 Hgb 10.8 VBG- decompensated respiratory alkalosis   Imaging Studies: I ordered and independently visualized and interpreted the following imaging CT scan PE chest   which showed multiple BL Pulmonary emboli with RV strain, mediastinal mass The interpretation of the imaging was limited to assessing for emergent pathology, for which purpose it was ordered.  Consultations Obtained: I requested consultation with the consultant Dr. Dub , and discussed  findings as well as pertinent plan - He will review the images and call back with recommendations TNK v. Heparin   Sign out given to PA Nivia at shift handoff for follow up on CC reccommendations.   Complexity  of problems addressed: Patient's presentation is most consistent with  acute presentation with potential threat to life or bodily function During patient's assessment  Disposition: After consideration of the diagnostic results and the patient's response to treatment,  I  feel that the patent would benefit from admission   .          Final Clinical Impression(s) / ED Diagnoses Final diagnoses:  None    Rx / DC Orders ED Discharge Orders     None         Arloa Chroman, PA-C 03/12/23 1630    Geraldene Hamilton, MD 03/13/23 [phone]

## 2023-03-09 NOTE — Progress Notes (Signed)
 Plan of Care Note for accepted transfer   Patient: Tara Martin MRN: [id number]   DOA: 03/09/2023  Facility requesting transfer: [facility].  ER. Requesting Provider: Lonni Camp.  PA. Reason for transfer: Pulmonary embolism.  Lung mass. Facility course: 83 year old female was brought into the ER because of shortness of breath and also had a fever.  In the ER patient underwent CT angiogram of the chest which shows bilateral pulm embolism with possible right ventricular strain for which critical care was consulted.  Critical care on reviewing patient's condition and labs requested starting heparin  and admission under hospitalist.  At this time they felt patient was not a candidate for thrombolytics.  Troponins are elevated likely from PE.  Also has a lung mass which will need further workup for ruling out malignancy.  Patient is accepted to stepdown unit.  COVID and flu test were negative.  Plan of care: The patient is accepted for admission to [facility] unit, at [facility]..   Author: Redia LOISE Cleaver, MD 03/09/2023  Check [email or web address] for on-call coverage.  Nursing staff, Please call [facility] Admits & Consults System-Wide number on Amion as soon as patient's arrival, so appropriate admitting provider can evaluate the pt.

## 2023-03-09 NOTE — ED Triage Notes (Addendum)
 Several days of rleft flank pain, wrapping around to front. Pain worsened until today too much to handle. Endorses chills, 100F temp, some confusion/brain fog since Saturday. Lips and fingers cyanotic on arrival. Recent UTI-finished treatment.

## 2023-03-09 NOTE — ED Notes (Signed)
Called [facility] to transport patient to [facility] Rm# [id number]

## 2023-03-09 NOTE — ED Notes (Signed)
 Pt.'s daughter mentioned that pt. Is starting to having more memory problems. Daughter thought it was due to the UTI that was discovered and treated at the urgent care.  But after pt. Completed the course of antibiotics, memory problems have worsen per daughter.

## 2023-03-09 NOTE — Progress Notes (Addendum)
 PCCM team in [place]: I was called to evaluate the need for thrombolytics and ICU admission to [facility] ICU. I have reviewed the notes, VS, labs, and CTA. HR 70 sinus after Fentanyl  50 mcg IV x1 No hypotension SpO2 100% on 3 L [place], RR 23 She has RLL infract (doubt cancerous nodule), and lower lobes PE without central PE/saddle PE or dilated PA. No pleural effusions or PNA. RV/LV ratio is elevated but she has RA dilated, ?chronic Mild hypoNa and slightly elevated trop NL LA Anemia probably of chronic illness  No previous cardiac hx or chronic cardiopulmonary disease or cancer PLAN:  No strong indication for thrombolytics Start IV Heparin  drip NOAC for 6 months at least  Echo Serial Trop Repeat CT chest wo after 6 weeks to assess RLL nodule, if present, needs PET scan and w/u for malignancy  PCT, if hight, start Abx and septic w/u D/w with the ICU team  Thank you

## 2023-03-09 NOTE — Progress Notes (Signed)
 PHARMACY - ANTICOAGULATION CONSULT NOTE  Pharmacy Consult for heparin  gtt Indication: pulmonary embolus  Allergies  Allergen Reactions   Codeine     Per patient made her flip out   Penicillins Hives   Sulfa Antibiotics Hives    Patient Measurements: Weight: 64 kg (141 lb) Heparin  Dosing Weight: 64kg  Vital Signs: Temp: 102.9 F (39.4 C) (01/01 1807) Temp Source: Oral (01/01 1807) BP: 108/69 (01/01 1845) Pulse Rate: 124 (01/01 1845)  Labs: Recent Labs    03/09/23 1647  HGB 10.8*  HCT 32.7*  PLT 239  CREATININE 0.81  TROPONINIHS 47*    Estimated Creatinine Clearance: 47 mL/min (by C-G formula based on SCr of 0.81 mg/dL).   Medical History: Past Medical History:  Diagnosis Date   Anxiety state, unspecified - sees Dr. Rosalynn in gyn and benzo prescribed there 04/09/2013   Arthritis    Chicken pox    Lupus    MCI (mild cognitive impairment) 05/20/2020   Migraines    Prediabetes    Assessment: 83 yo F with PE. Pharmacy consulted for heparin  infusion. No anticoagulants PTA.   Hgb 10.8, Plt 239 hsTrop 47   Goal of Therapy:  Heparin  level 0.3-0.7 units/ml Monitor platelets by anticoagulation protocol: Yes   Plan:  Give 4500 units bolus x 1 Start heparin  infusion at 1050 units/hr Check anti-Xa level in 8 hours and daily while on heparin  Continue to monitor H&H and platelets  Sharyne Glatter, PharmD, BCCCP Clinical Pharmacist 03/09/2023 7:46 PM

## 2023-03-09 NOTE — ED Notes (Signed)
 Archie Endo, PA aware of 132 troponin

## 2023-03-09 NOTE — ED Notes (Signed)
 Patient aware of urine specimen. Verbalized inability to urinate at this time.

## 2023-03-10 ENCOUNTER — Telehealth: Payer: Self-pay | Admitting: Physician Assistant

## 2023-03-10 ENCOUNTER — Encounter (HOSPITAL_COMMUNITY): Payer: Self-pay | Admitting: Internal Medicine

## 2023-03-10 ENCOUNTER — Inpatient Hospital Stay (HOSPITAL_COMMUNITY): Payer: Medicare HMO

## 2023-03-10 DIAGNOSIS — I2699 Other pulmonary embolism without acute cor pulmonale: Secondary | ICD-10-CM | POA: Diagnosis present

## 2023-03-10 DIAGNOSIS — R918 Other nonspecific abnormal finding of lung field: Secondary | ICD-10-CM

## 2023-03-10 DIAGNOSIS — G43909 Migraine, unspecified, not intractable, without status migrainosus: Secondary | ICD-10-CM | POA: Diagnosis not present

## 2023-03-10 DIAGNOSIS — R0609 Other forms of dyspnea: Secondary | ICD-10-CM

## 2023-03-10 DIAGNOSIS — J9601 Acute respiratory failure with hypoxia: Secondary | ICD-10-CM

## 2023-03-10 DIAGNOSIS — I10 Essential (primary) hypertension: Secondary | ICD-10-CM | POA: Diagnosis not present

## 2023-03-10 DIAGNOSIS — Z6825 Body mass index (BMI) 25.0-25.9, adult: Secondary | ICD-10-CM | POA: Diagnosis not present

## 2023-03-10 DIAGNOSIS — Z1152 Encounter for screening for COVID-19: Secondary | ICD-10-CM | POA: Diagnosis not present

## 2023-03-10 DIAGNOSIS — R54 Age-related physical debility: Secondary | ICD-10-CM | POA: Diagnosis not present

## 2023-03-10 DIAGNOSIS — F411 Generalized anxiety disorder: Secondary | ICD-10-CM | POA: Diagnosis not present

## 2023-03-10 DIAGNOSIS — Z882 Allergy status to sulfonamides status: Secondary | ICD-10-CM | POA: Diagnosis not present

## 2023-03-10 DIAGNOSIS — G3184 Mild cognitive impairment, so stated: Secondary | ICD-10-CM | POA: Diagnosis not present

## 2023-03-10 DIAGNOSIS — I82403 Acute embolism and thrombosis of unspecified deep veins of lower extremity, bilateral: Secondary | ICD-10-CM | POA: Diagnosis not present

## 2023-03-10 DIAGNOSIS — E871 Hypo-osmolality and hyponatremia: Secondary | ICD-10-CM | POA: Diagnosis not present

## 2023-03-10 DIAGNOSIS — I2609 Other pulmonary embolism with acute cor pulmonale: Secondary | ICD-10-CM | POA: Diagnosis not present

## 2023-03-10 DIAGNOSIS — Z8261 Family history of arthritis: Secondary | ICD-10-CM | POA: Diagnosis not present

## 2023-03-10 DIAGNOSIS — Z885 Allergy status to narcotic agent status: Secondary | ICD-10-CM | POA: Diagnosis not present

## 2023-03-10 DIAGNOSIS — R911 Solitary pulmonary nodule: Secondary | ICD-10-CM

## 2023-03-10 DIAGNOSIS — D638 Anemia in other chronic diseases classified elsewhere: Secondary | ICD-10-CM | POA: Diagnosis not present

## 2023-03-10 DIAGNOSIS — R7303 Prediabetes: Secondary | ICD-10-CM | POA: Diagnosis not present

## 2023-03-10 DIAGNOSIS — Z8249 Family history of ischemic heart disease and other diseases of the circulatory system: Secondary | ICD-10-CM | POA: Diagnosis not present

## 2023-03-10 DIAGNOSIS — Z86711 Personal history of pulmonary embolism: Secondary | ICD-10-CM | POA: Diagnosis not present

## 2023-03-10 DIAGNOSIS — Z820 Family history of epilepsy and other diseases of the nervous system: Secondary | ICD-10-CM | POA: Diagnosis not present

## 2023-03-10 DIAGNOSIS — I82A13 Acute embolism and thrombosis of axillary vein, bilateral: Secondary | ICD-10-CM | POA: Diagnosis not present

## 2023-03-10 DIAGNOSIS — R590 Localized enlarged lymph nodes: Secondary | ICD-10-CM | POA: Diagnosis not present

## 2023-03-10 DIAGNOSIS — M329 Systemic lupus erythematosus, unspecified: Secondary | ICD-10-CM | POA: Diagnosis not present

## 2023-03-10 DIAGNOSIS — Z833 Family history of diabetes mellitus: Secondary | ICD-10-CM | POA: Diagnosis not present

## 2023-03-10 DIAGNOSIS — Z79899 Other long term (current) drug therapy: Secondary | ICD-10-CM | POA: Diagnosis not present

## 2023-03-10 DIAGNOSIS — Z825 Family history of asthma and other chronic lower respiratory diseases: Secondary | ICD-10-CM | POA: Diagnosis not present

## 2023-03-10 DIAGNOSIS — Z880 Allergy status to penicillin: Secondary | ICD-10-CM | POA: Diagnosis not present

## 2023-03-10 LAB — CBC WITH DIFFERENTIAL/PLATELET
Abs Immature Granulocytes: 0.08 10*3/uL — ABNORMAL HIGH (ref 0.00–0.07)
Basophils Absolute: 0 10*3/uL (ref 0.0–0.1)
Basophils Relative: 1 %
Eosinophils Absolute: 0.1 10*3/uL (ref 0.0–0.5)
Eosinophils Relative: 1 %
HCT: 29 % — ABNORMAL LOW (ref 36.0–46.0)
Hemoglobin: 9.4 g/dL — ABNORMAL LOW (ref 12.0–15.0)
Immature Granulocytes: 1 %
Lymphocytes Relative: 13 %
Lymphs Abs: 1 10*3/uL (ref 0.7–4.0)
MCH: 28.6 pg (ref 26.0–34.0)
MCHC: 32.4 g/dL (ref 30.0–36.0)
MCV: 88.1 fL (ref 80.0–100.0)
Monocytes Absolute: 0.8 10*3/uL (ref 0.1–1.0)
Monocytes Relative: 9 %
Neutro Abs: 6.3 10*3/uL (ref 1.7–7.7)
Neutrophils Relative %: 75 %
Platelets: 217 10*3/uL (ref 150–400)
RBC: 3.29 MIL/uL — ABNORMAL LOW (ref 3.87–5.11)
RDW: 12.5 % (ref 11.5–15.5)
WBC: 8.3 10*3/uL (ref 4.0–10.5)
nRBC: 0 % (ref 0.0–0.2)

## 2023-03-10 LAB — BASIC METABOLIC PANEL
Anion gap: 9 (ref 5–15)
BUN: 20 mg/dL (ref 8–23)
CO2: 21 mmol/L — ABNORMAL LOW (ref 22–32)
Calcium: 7.9 mg/dL — ABNORMAL LOW (ref 8.9–10.3)
Chloride: 101 mmol/L (ref 98–111)
Creatinine, Ser: 0.74 mg/dL (ref 0.44–1.00)
GFR, Estimated: >60 mL/min (ref >60)
Glucose, Bld: 110 mg/dL — ABNORMAL HIGH (ref 70–99)
Potassium: 4.1 mmol/L (ref 3.5–5.1)
Sodium: 131 mmol/L — ABNORMAL LOW (ref 135–145)

## 2023-03-10 LAB — URINALYSIS, W/ REFLEX TO CULTURE (INFECTION SUSPECTED)
Bacteria, UA: NONE SEEN
Bilirubin Urine: NEGATIVE
Glucose, UA: NEGATIVE mg/dL
Hgb urine dipstick: NEGATIVE
Ketones, ur: NEGATIVE mg/dL
Leukocytes,Ua: NEGATIVE
Nitrite: NEGATIVE
Specific Gravity, Urine: 1.045 — ABNORMAL HIGH (ref 1.005–1.030)
pH: 6 (ref 5.0–8.0)

## 2023-03-10 LAB — ECHOCARDIOGRAM COMPLETE
AR max vel: 2.62 cm2
AV Area VTI: 2.79 cm2
AV Area mean vel: 2.65 cm2
AV Mean grad: 4 mm[Hg]
AV Peak grad: 8.2 mm[Hg]
Ao pk vel: 1.43 m/s
Area-P 1/2: 3.17 cm2
Height: 64 in
S' Lateral: 2.5 cm
Weight: 2292.78 [oz_av]

## 2023-03-10 LAB — TROPONIN I (HIGH SENSITIVITY): Troponin I (High Sensitivity): 80 ng/L — ABNORMAL HIGH (ref <18)

## 2023-03-10 LAB — HEPARIN LEVEL (UNFRACTIONATED)
Heparin Unfractionated: >1.1 [IU]/mL — ABNORMAL HIGH (ref 0.30–0.70)
Heparin Unfractionated: 0.39 [IU]/mL (ref 0.30–0.70)

## 2023-03-10 LAB — MRSA NEXT GEN BY PCR, NASAL: MRSA by PCR Next Gen: NOT DETECTED

## 2023-03-10 MED ORDER — OXYCODONE-ACETAMINOPHEN 5-325 MG PO TABS
1.0000 | ORAL_TABLET | ORAL | Status: DC | PRN
  Administered 2023-03-10: 1 via ORAL
  Filled 2023-03-10: qty 1

## 2023-03-10 MED ORDER — DONEPEZIL HCL 10 MG PO TABS
10.0000 mg | ORAL_TABLET | Freq: Every day | ORAL | Status: DC
  Administered 2023-03-10: 10 mg via ORAL
  Filled 2023-03-10: qty 1

## 2023-03-10 MED ORDER — CALCIUM CARBONATE ANTACID 500 MG PO CHEW
400.0000 mg | CHEWABLE_TABLET | Freq: Three times a day (TID) | ORAL | Status: DC
  Administered 2023-03-10 – 2023-03-11 (×4): 400 mg via ORAL
  Filled 2023-03-10 (×4): qty 2

## 2023-03-10 MED ORDER — POLYETHYLENE GLYCOL 3350 17 G PO PACK
17.0000 g | PACK | Freq: Two times a day (BID) | ORAL | Status: DC
  Administered 2023-03-10 – 2023-03-11 (×2): 17 g via ORAL
  Filled 2023-03-10 (×2): qty 1

## 2023-03-10 MED ORDER — APIXABAN 5 MG PO TABS
10.0000 mg | ORAL_TABLET | Freq: Two times a day (BID) | ORAL | Status: DC
  Administered 2023-03-10: 10 mg via ORAL
  Filled 2023-03-10: qty 2

## 2023-03-10 MED ORDER — APIXABAN 5 MG PO TABS: 5.0000 mg | ORAL_TABLET | Freq: Two times a day (BID) | ORAL | Status: DC

## 2023-03-10 MED ORDER — PANTOPRAZOLE SODIUM 40 MG PO TBEC
40.0000 mg | DELAYED_RELEASE_TABLET | Freq: Two times a day (BID) | ORAL | Status: DC
  Administered 2023-03-10 – 2023-03-11 (×3): 40 mg via ORAL
  Filled 2023-03-10 (×3): qty 1

## 2023-03-10 MED ORDER — ACETAMINOPHEN 325 MG PO TABS
650.0000 mg | ORAL_TABLET | Freq: Four times a day (QID) | ORAL | Status: DC | PRN
  Administered 2023-03-10: 650 mg via ORAL
  Filled 2023-03-10: qty 2

## 2023-03-10 MED ORDER — SODIUM CHLORIDE 0.9 % IV SOLN: INTRAVENOUS | Status: AC

## 2023-03-10 MED ORDER — HYDROMORPHONE HCL 1 MG/ML IJ SOLN
0.5000 mg | INTRAMUSCULAR | Status: DC | PRN
  Administered 2023-03-10 (×3): 1 mg via INTRAVENOUS
  Filled 2023-03-10 (×3): qty 1

## 2023-03-10 MED ORDER — ONDANSETRON HCL 4 MG PO TABS: 4.0000 mg | ORAL_TABLET | Freq: Four times a day (QID) | ORAL | Status: DC | PRN

## 2023-03-10 MED ORDER — ONDANSETRON HCL 4 MG/2ML IJ SOLN: 4.0000 mg | Freq: Four times a day (QID) | INTRAMUSCULAR | Status: DC | PRN

## 2023-03-10 MED ORDER — ALPRAZOLAM 0.5 MG PO TABS
0.5000 mg | ORAL_TABLET | ORAL | Status: DC | PRN
  Administered 2023-03-10: 0.5 mg via ORAL
  Filled 2023-03-10: qty 1

## 2023-03-10 MED ORDER — FENTANYL CITRATE PF 50 MCG/ML IJ SOSY
50.0000 ug | PREFILLED_SYRINGE | Freq: Once | INTRAMUSCULAR | Status: AC
  Administered 2023-03-10: 50 ug via INTRAVENOUS
  Filled 2023-03-10: qty 1

## 2023-03-10 MED ORDER — ACETAMINOPHEN 650 MG RE SUPP: 650.0000 mg | Freq: Four times a day (QID) | RECTAL | Status: DC | PRN

## 2023-03-10 NOTE — ED Notes (Signed)
 Called report to [facility] and given to [place], [place]

## 2023-03-10 NOTE — Progress Notes (Signed)
 PHARMACY - ANTICOAGULATION CONSULT NOTE  Pharmacy Consult for heparin  transition to apixaban  Indication: pulmonary embolus  Allergies  Allergen Reactions   Codeine Other (See Comments)    Per patient made her flip out    Patient Measurements: Height: 5' 4 (162.6 cm) Weight: 65 kg (143 lb 4.8 oz) IBW/kg (Calculated) : 54.7 Heparin  Dosing Weight: 64kg  Vital Signs: Temp: 98.7 F (37.1 C) (01/02 1113) Temp Source: Oral (01/02 1113) BP: 110/60 (01/02 1113) Pulse Rate: 69 (01/02 1113)  Labs: Recent Labs    03/09/23 1647 03/09/23 2000 03/09/23 2005 03/09/23 2255 03/10/23 0339 03/10/23 0409 03/10/23 1159  HGB 10.8*  --  9.5*  --  9.4*  --   --   HCT 32.7*  --  28.0*  --  29.0*  --   --   PLT 239  --   --   --  217  --   --   HEPARINUNFRC  --   --   --   --   --  >1.10* 0.39  CREATININE 0.81  --   --   --  0.74  --   --   TROPONINIHS 47* 132*  --  139* 80*  --   --     Estimated Creatinine Clearance: 47.6 mL/min (by C-G formula based on SCr of 0.74 mg/dL).   Medical History: Past Medical History:  Diagnosis Date   Anxiety state, unspecified - sees Dr. Rosalynn in gyn and benzo prescribed there 04/09/2013   Arthritis    Chicken pox    Lupus    MCI (mild cognitive impairment) 05/20/2020   Migraines    Prediabetes    Assessment: 83 yo F with PE. Pharmacy consulted to transition to apixaban    Goal of Therapy:  Monitor platelets by anticoagulation protocol: Yes   Plan:  Stop heparin  gtt at 2200 Start apixaban  10 mg po bid x7 days followed by 5 mg po bid thereafter (transition date: 03/17/2023 PM dose) Monitor for signs of bleeding Thank you   Benedetta Heath BS, PharmD, BCPS Clinical Pharmacist 03/10/2023 3:14 PM  Contact: [phone] after 3 PM  Be curious, not judgmental... -Davina Sprinkles

## 2023-03-10 NOTE — Consult Note (Signed)
 NAME:  Tara Martin, MRN:  [id number], DOB:  13-Dec-1940, LOS: 0 ADMISSION DATE:  03/09/2023, CONSULTATION DATE:  03/10/23 REFERRING MD:  Celinda CHIEF COMPLAINT:  PE and Lung Mass   History of Present Illness:  Tara Martin is a 83 y.o. female who has a PMH as below. She was admitted to [facility] 1/1 with dyspnea and was found to have bilateral PE in lower lobes as well as a 2.5cm spiculated mass lesion in the RML with hilar and mediastinal lymphadenopathy.  She was place don 4L O2 and was admitted by TRH. She has never required supplemental O2 in the past.  PCCM called in consultation 1/2 for further recs.  She denies any prior hx of malignancy or VTE. No recent long trips, hemoptysis, weight loss, LE edema. She does not smoke and has never smoked before. No significant second hand smoke exposure. She is currently on 2L O2 with sats high 90s. Denies chest pain, lightheadedness. Dyspnea slightly improved. She is currently on a Heparin  infusion.  Pertinent  Medical History:  has SLE (systemic lupus erythematosus related syndrome) (HCC); Anxiety state; Prediabetes; Facial numbness; Acute midline low back pain with bilateral sciatica; MCI (mild cognitive impairment); Pulmonary embolism (HCC); Mass of middle lobe of right lung; Acute respiratory failure with hypoxia (HCC); and Acute pulmonary embolism (HCC) on their problem list.  Significant Hospital Events: Including procedures, antibiotic start and stop dates in addition to other pertinent events   1/1 admit 1/2 pulmonary consult  Interim History / Subjective:  On 2L O2. Denies chest pain, dyspnea at rest. Feels OK.  Objective:  Blood pressure 110/60, pulse 69, temperature 98.7 F (37.1 C), temperature source Oral, resp. rate (!) 23, height 5' 4 (1.626 m), weight 65 kg, SpO2 100%.        Intake/Output Summary (Last 24 hours) at 03/10/2023 1318 Last data filed at 03/10/2023 0810 Gross per 24 hour  Intake 159.72 ml  Output --  Net  159.72 ml   Filed Weights   03/09/23 1642 03/10/23 0646  Weight: 64 kg 65 kg    Examination: General: Adult female, frail, resting in bed, in NAD. Neuro: A&O x 3, no deficits. HEENT: [place]/AT. Sclerae anicteric. EOMI. Cardiovascular: RRR, no M/R/G.  Lungs: Respirations even and unlabored.  CTA bilaterally, No W/R/R. Abdomen: BS x 4, soft, NT/ND.  Musculoskeletal: No gross deformities, no edema.  Skin: Intact, warm, no rashes.  Labs/imaging personally reviewed:  CTA chest 1/1 > bilateral lower lobe PE's, possible RLL infarct, RV/LV 1.5.  2.5cm spiculated mass lesion RML with associated right hilar and mediastinal lymphadenopathy.  Assessment & Plan:   Bilateral lower lobe PE's - could be considered provoked in setting of possibly undiagnosed lung CA. Acute hypoxic respiratory failure - 2/2 above. - On heparin  gtt now, OK to go ahead and start on DOAC. - Continue DOAC minimum 3 - 6 months. - Continue supplemental O2 as needed to maintain SpO2 > 92%. - Needs ambulatory desaturation study prior to d/c. - Routine bronchial hygiene. - F/u on echo, LE duplex.  Probable RML lung nodule/bronchogenic carcinoma. - After discussion with pt and husband, prefer to treat PE as above for now/acutely. - F/u as an outpatient in the next 7-10 days for PE follow up and at that time, a repeat CT scan can be ordered as an outpatient in 6 - 9 weeks time. - Based on repeat CT, can discuss PET and possible biopsy with Dr. Shelah. - Will need to be off DOAC  prior to any biopsies (down the road after above follow up scans).   Rest per primary team.  Best practice (evaluated daily):  Per primary team.  Labs   CBC: Recent Labs  Lab 03/09/23 1647 03/09/23 2005 03/10/23 0339  WBC 8.6  --  8.3  NEUTROABS 7.3  --  6.3  HGB 10.8* 9.5* 9.4*  HCT 32.7* 28.0* 29.0*  MCV 87.0  --  88.1  PLT 239  --  217    Basic Metabolic Panel: Recent Labs  Lab 03/09/23 1647 03/09/23 2005 03/10/23 0339  NA  130* 128* 131*  K 4.3 4.2 4.1  CL 97*  --  101  CO2 21*  --  21*  GLUCOSE 120*  --  110*  BUN 21  --  20  CREATININE 0.81  --  0.74  CALCIUM  8.3*  --  7.9*   GFR: Estimated Creatinine Clearance: 47.6 mL/min (by C-G formula based on SCr of 0.74 mg/dL). Recent Labs  Lab 03/09/23 1647 03/09/23 2000 03/10/23 0339  PROCALCITON  --  0.15  --   WBC 8.6  --  8.3  LATICACIDVEN 1.2 1.7  --     Liver Function Tests: Recent Labs  Lab 03/09/23 1647  AST 45*  ALT 52*  ALKPHOS 78  BILITOT 0.4  PROT 6.7  ALBUMIN 3.6   No results for input(s): LIPASE, AMYLASE in the last 168 hours. No results for input(s): AMMONIA in the last 168 hours.  ABG    Component Value Date/Time   HCO3 18.0 (L) 03/09/2023 2005   TCO2 19 (L) 03/09/2023 2005   ACIDBASEDEF 5.0 (H) 03/09/2023 2005   O2SAT 90 03/09/2023 2005     Coagulation Profile: No results for input(s): INR, PROTIME in the last 168 hours.  Cardiac Enzymes: No results for input(s): CKTOTAL, CKMB, CKMBINDEX, TROPONINI in the last 168 hours.  HbA1C: Hemoglobin A1C  Date/Time Value Ref Range Status  08/09/2022 08:52 AM 5.9 (A) 4.0 - 5.6 % Final   Hgb A1c MFr Bld  Date/Time Value Ref Range Status  05/12/2020 02:47 PM 6.1 4.6 - 6.5 % Final    Comment:    Glycemic Control Guidelines for People with Diabetes:Non Diabetic:  <6%Goal of Therapy: <7%Additional Action Suggested:  >8%   12/11/2019 09:08 AM 6.0 (H) <5.7 % of total Hgb Final    Comment:    For someone without known diabetes, a hemoglobin  A1c value between 5.7% and 6.4% is consistent with prediabetes and should be confirmed with a  follow-up test. . For someone with known diabetes, a value <7% indicates that their diabetes is well controlled. A1c targets should be individualized based on duration of diabetes, age, comorbid conditions, and other considerations. . This assay result is consistent with an increased risk of diabetes. . Currently, no  consensus exists regarding use of hemoglobin A1c for diagnosis of diabetes for children. .     CBG: No results for input(s): GLUCAP in the last 168 hours.  Review of Systems:   All negative; except for those that are bolded, which indicate positives.  Constitutional: weight loss, weight gain, night sweats, fevers, chills, fatigue, weakness.  HEENT: headaches, sore throat, sneezing, nasal congestion, post nasal drip, difficulty swallowing, tooth/dental problems, visual complaints, visual changes, ear aches. Neuro: difficulty with speech, weakness, numbness, ataxia. CV:  chest pain, orthopnea, PND, swelling in lower extremities, dizziness, palpitations, syncope.  Resp: cough, hemoptysis, dyspnea, wheezing. GI: heartburn, indigestion, abdominal pain, nausea, vomiting, diarrhea, constipation, change in bowel habits,  loss of appetite, hematemesis, melena, hematochezia.  GU: dysuria, change in color of urine, urgency or frequency, flank pain, hematuria. MSK: joint pain or swelling, decreased range of motion. Psych: change in mood or affect, depression, anxiety, suicidal ideations, homicidal ideations. Skin: rash, itching, bruising.   Past Medical History:  She,  has a past medical history of Anxiety state, unspecified - sees Dr. Rosalynn in gyn and benzo prescribed there (04/09/2013), Arthritis, Chicken pox, Lupus, MCI (mild cognitive impairment) (05/20/2020), Migraines, and Prediabetes.   Surgical History:   Past Surgical History:  Procedure Laterality Date   BACK SURGERY  2000   CHOLECYSTECTOMY     DILATION AND CURETTAGE OF UTERUS  1973   GALLBLADDER SURGERY  1974     Social History:   reports that she has never smoked. She has never used smokeless tobacco. She reports that she does not currently use alcohol after a past usage of about 2.0 standard drinks of alcohol per week. She reports that she does not use drugs.   Family History:  Her family history includes Alcoholism in an other  family member; Alzheimer's disease in her brother and sister; Arthritis in her mother and another family member; COPD in her sister; Diabetes in her mother; Heart attack in her mother; Heart disease in her brother and mother; Hypertension in an other family member; Migraines in her daughter; Pneumonia in her father; Pulmonary embolism in her brother; Sudden death in an other family member. There is no history of Colon cancer, Esophageal cancer, Stomach cancer, or Colon polyps.   Allergies Allergies  Allergen Reactions   Codeine Other (See Comments)    Per patient made her flip out     Home Medications  Prior to Admission medications   Medication Sig Start Date End Date Taking? Authorizing Provider  ALPRAZolam  (XANAX ) 0.5 MG tablet Take 0.5 mg by mouth daily as needed for anxiety.   Yes Rosalynn LELON Ingle, MD  diclofenac  sodium (VOLTAREN ) 1 % GEL Apply 3 gm to 3 large joints up to 3 times a day.Dispense 3 tubes with 3 refills. 02/18/17  Yes Deveshwar, Maya, MD  estradiol (ESTRACE) 0.1 MG/GM vaginal cream Place 1 Applicatorful vaginally 3 (three) times a week.   Yes [provider]  Multiple Vitamins-Minerals (MULTIVITAMIN WOMEN 50+) TABS Take 1 tablet by mouth daily.   Yes [provider]  cefUROXime (CEFTIN) 250 MG tablet Take 250 mg by mouth 2 (two) times daily. Patient not taking: Reported on 03/10/2023    [provider]      Sammi Gore, PA - [facility] For pager details, please see AMION or use Epic chat  After 1900, please call [facility] for cross coverage needs 03/10/2023, 1:18 PM

## 2023-03-10 NOTE — H&P (Addendum)
 History and Physical  Tara Martin FMW:[id number] DOB: 04-01-1940 DOA: 03/09/2023  PCP: Micheal Wolm ORN, MD Patient coming from: Home  I have personally briefly reviewed patient's old medical records in [facility]   Chief Complaint: Shortness of breath  HPI: Tara Martin is a 83 y.o. female past medical history of lupus not on treatment, mild cognitive impairment (on Aricept ), essential hypertension comes into the ED for shortness of breath that started several days prior to admission.  CT angio of the chest was done that showed bilateral PE in the lower lobes and a 2.5 spiculated mass lesion in the right middle lobe with associated hilar and mediastinal lymphadenopathy  In the ED: She was found to be hypoxic placed on 4 L.   Review of Systems: All systems reviewed and apart from history of presenting illness, are negative.  Past Medical History:  Diagnosis Date   Anxiety state, unspecified - sees Dr. Rosalynn in gyn and benzo prescribed there 04/09/2013   Arthritis    Chicken pox    Lupus    MCI (mild cognitive impairment) 05/20/2020   Migraines    Prediabetes    Past Surgical History:  Procedure Laterality Date   BACK SURGERY  2000   CHOLECYSTECTOMY     DILATION AND CURETTAGE OF UTERUS  1973   GALLBLADDER SURGERY  1974   Social History:  reports that she has never smoked. She has never used smokeless tobacco. She reports that she does not currently use alcohol after a past usage of about 2.0 standard drinks of alcohol per week. She reports that she does not use drugs.   Allergies  Allergen Reactions   Codeine     Per patient made her flip out   Penicillins Hives   Sulfa Antibiotics Hives    Family History  Problem Relation Age of Onset   Arthritis Mother    Diabetes Mother    Heart disease Mother    Heart attack Mother    Pneumonia Father    COPD Sister    Alzheimer's disease Sister    Heart disease Brother    Pulmonary embolism Brother     Alzheimer's disease Brother    Migraines Daughter    Alcoholism Other    Arthritis Other    Hypertension Other    Sudden death Other    Colon cancer Neg Hx    Esophageal cancer Neg Hx    Stomach cancer Neg Hx    Colon polyps Neg Hx     Prior to Admission medications   Medication Sig Start Date End Date Taking? Authorizing Provider  ALPRAZolam  (XANAX ) 0.5 MG tablet Take 0.5 mg by mouth as needed for anxiety.     Rosalynn ORN Ingle, MD  diclofenac  sodium (VOLTAREN ) 1 % GEL Apply 3 gm to 3 large joints up to 3 times a day.Dispense 3 tubes with 3 refills. 02/18/17   Dolphus Reiter, MD  donepezil  (ARICEPT ) 10 MG tablet Take 1 tablet (10 mg total) by mouth at bedtime. 10/05/21   Burchette, Wolm ORN, MD  Multiple Vitamins-Minerals (MULTIVITAMIN WITH MINERALS) tablet Take 1 tablet by mouth daily.    [provider]   Physical Exam: Vitals:   03/10/23 0345 03/10/23 0558 03/10/23 0646 03/10/23 0741  BP: 114/67 129/76  (!) 112/57  Pulse: (!) 57 81  84  Resp: 18 20  (!) 23  Temp:  98.9 F (37.2 C)  98.9 F (37.2 C)  TempSrc:  Oral Oral Oral  SpO2: 100% 100%  96%  Weight:   65 kg   Height:   5' 4 (1.626 m)     General exam: Moderately built and nourished patient, lying comfortably supine on the gurney in no obvious distress. Head, eyes and ENT: Nontraumatic and normocephalic.  Neck: Supple. No JVD, carotid bruit or thyromegaly. Lymphatics: No lymphadenopathy. Respiratory system: Clear to auscultation. No increased work of breathing. Cardiovascular system: S1 and S2 heard, RRR.  Gastrointestinal system: Abdomen is nondistended, soft and nontender. Normal bowel sounds heard. No organomegaly or masses appreciated. Central nervous system: Alert and oriented. No focal neurological deficits. Extremities: Symmetric 5 x 5 power. Peripheral pulses symmetrically felt.  Skin: No rashes or acute findings. Musculoskeletal system: Negative exam. Psychiatry: Pleasant and  cooperative.   Labs on Admission:  Basic Metabolic Panel: Recent Labs  Lab 03/09/23 1647 03/09/23 2005 03/10/23 0339  NA 130* 128* 131*  K 4.3 4.2 4.1  CL 97*  --  101  CO2 21*  --  21*  GLUCOSE 120*  --  110*  BUN 21  --  20  CREATININE 0.81  --  0.74  CALCIUM  8.3*  --  7.9*   Liver Function Tests: Recent Labs  Lab 03/09/23 1647  AST 45*  ALT 52*  ALKPHOS 78  BILITOT 0.4  PROT 6.7  ALBUMIN 3.6   No results for input(s): LIPASE, AMYLASE in the last 168 hours. No results for input(s): AMMONIA in the last 168 hours. CBC: Recent Labs  Lab 03/09/23 1647 03/09/23 2005 03/10/23 0339  WBC 8.6  --  8.3  NEUTROABS 7.3  --  6.3  HGB 10.8* 9.5* 9.4*  HCT 32.7* 28.0* 29.0*  MCV 87.0  --  88.1  PLT 239  --  217   Cardiac Enzymes: No results for input(s): CKTOTAL, CKMB, CKMBINDEX, TROPONINI in the last 168 hours.  BNP (last 3 results) No results for input(s): PROBNP in the last 8760 hours. CBG: No results for input(s): GLUCAP in the last 168 hours.  Radiological Exams on Admission: CT Angio Chest PE W and/or Wo Contrast Result Date: 03/09/2023 CLINICAL DATA:  Chest and abdominal pain with fevers, initial encounter EXAM: CT ANGIOGRAPHY CHEST CT ABDOMEN AND PELVIS WITH CONTRAST TECHNIQUE: Multidetector CT imaging of the chest was performed using the standard protocol during bolus administration of intravenous contrast. Multiplanar CT image reconstructions and MIPs were obtained to evaluate the vascular anatomy. Multidetector CT imaging of the abdomen and pelvis was performed using the standard protocol during bolus administration of intravenous contrast. RADIATION DOSE REDUCTION: This exam was performed according to the departmental dose-optimization program which includes automated exposure control, adjustment of the mA and/or kV according to patient size and/or use of iterative reconstruction technique. CONTRAST:  80mL OMNIPAQUE  IOHEXOL  350 MG/ML SOLN  COMPARISON:  Chest x-ray from earlier in the same day. FINDINGS: CTA CHEST FINDINGS Cardiovascular: Thoracic aorta is well visualize without evidence of aneurysmal dilatation or dissection. Mild atherosclerotic calcifications are seen. Pulmonary artery is well visualized with evidence of bilateral pulmonary emboli left slightly greater than right particularly in the lower lobes. RV/LV ratio is elevated at 1.5 consistent with right heart strain. Mild coronary calcifications are noted. Mediastinum/Nodes: Thoracic inlet is within normal limits. Esophagus as visualized is within normal limits. Scattered mediastinal lymph nodes are noted in the pretracheal and precarinal region as well as a large subcarinal node which measures 10 mm in short axis. Additionally extensive right hilar lymph nodes are noted. Additionally right middle lobe there  is a 2.5 cm solid spiculated lesion medially along the right heart border. This is new from a prior CT examination of 2015 and suspicious for neoplasm till proven otherwise. Lungs/Pleura: Emphysematous changes are noted bilaterally. Rounded basilar density is noted in the right lower lobe suspicious for pulmonary infarct given the underlying pulmonary emboli. Left basilar atelectasis is noted as well. Musculoskeletal: Degenerative changes of the thoracic spine are noted. Review of the MIP images confirms the above findings. CT ABDOMEN and PELVIS FINDINGS Hepatobiliary: No focal liver abnormality is seen. Status post cholecystectomy. No biliary dilatation. Pancreas: Unremarkable. No pancreatic ductal dilatation or surrounding inflammatory changes. Spleen: Normal in size without focal abnormality. Adrenals/Urinary Tract: Adrenal glands are within normal limits. Kidneys demonstrate a normal enhancement pattern bilaterally. No renal calculi or obstructive changes are noted. The bladder is well distended. Stomach/Bowel: Scattered diverticular change of the colon is noted without evidence  of diverticulitis. No obstructive changes of the colon are seen. The appendix is not well visualized and may have been surgically removed. No inflammatory changes to suggest appendicitis are noted. Small bowel and stomach appear within normal limits. Vascular/Lymphatic: Aortic atherosclerosis. No enlarged abdominal or pelvic lymph nodes. Reproductive: Uterus is enlarged with multiple calcified and noncalcified uterine fibroids. No adnexal mass is seen. Other: No abdominal wall hernia or abnormality. No abdominopelvic ascites. Musculoskeletal: Degenerative changes of lumbar spine are noted. Review of the MIP images confirms the above findings. IMPRESSION: CTA of the chest: Changes consistent with bilateral pulmonary emboli primarily in the lower lobes bilaterally. Adjacent noted area of density in the right lower lobe is noted likely representing a pulmonary infarct. Right heart strain is noted as well (RV/LV ratio 1.5). The presence of right heart strain has been associated with an increased risk of morbidity and mortality. Please refer to the Code PE Focused order set in EPIC. 2.5 cm somewhat spiculated mass lesion identified within right middle lobe adjacent to the cardiac border. Associated right hilar and mediastinal lymph nodes noted. These changes are consistent with pulmonary neoplasm till proven otherwise. PET-CT and tissue sampling is recommended for further evaluation. CT of the abdomen and pelvis: Diverticular change of the colon without evidence of diverticulitis. Multiple uterine fibroids. Critical Value/emergent results were called by telephone at the time of interpretation on 03/09/2023 at 7:35 pm to Lonni Camp PA, who verbally acknowledged these results. Electronically Signed   By: Oneil Devonshire M.D.   On: 03/09/2023 19:56   CT ABDOMEN PELVIS W CONTRAST Result Date: 03/09/2023 CLINICAL DATA:  Chest and abdominal pain with fevers, initial encounter EXAM: CT ANGIOGRAPHY CHEST CT ABDOMEN AND PELVIS  WITH CONTRAST TECHNIQUE: Multidetector CT imaging of the chest was performed using the standard protocol during bolus administration of intravenous contrast. Multiplanar CT image reconstructions and MIPs were obtained to evaluate the vascular anatomy. Multidetector CT imaging of the abdomen and pelvis was performed using the standard protocol during bolus administration of intravenous contrast. RADIATION DOSE REDUCTION: This exam was performed according to the departmental dose-optimization program which includes automated exposure control, adjustment of the mA and/or kV according to patient size and/or use of iterative reconstruction technique. CONTRAST:  80mL OMNIPAQUE  IOHEXOL  350 MG/ML SOLN COMPARISON:  Chest x-ray from earlier in the same day. FINDINGS: CTA CHEST FINDINGS Cardiovascular: Thoracic aorta is well visualize without evidence of aneurysmal dilatation or dissection. Mild atherosclerotic calcifications are seen. Pulmonary artery is well visualized with evidence of bilateral pulmonary emboli left slightly greater than right particularly in the lower lobes. RV/LV ratio  is elevated at 1.5 consistent with right heart strain. Mild coronary calcifications are noted. Mediastinum/Nodes: Thoracic inlet is within normal limits. Esophagus as visualized is within normal limits. Scattered mediastinal lymph nodes are noted in the pretracheal and precarinal region as well as a large subcarinal node which measures 10 mm in short axis. Additionally extensive right hilar lymph nodes are noted. Additionally right middle lobe there is a 2.5 cm solid spiculated lesion medially along the right heart border. This is new from a prior CT examination of 2015 and suspicious for neoplasm till proven otherwise. Lungs/Pleura: Emphysematous changes are noted bilaterally. Rounded basilar density is noted in the right lower lobe suspicious for pulmonary infarct given the underlying pulmonary emboli. Left basilar atelectasis is noted as  well. Musculoskeletal: Degenerative changes of the thoracic spine are noted. Review of the MIP images confirms the above findings. CT ABDOMEN and PELVIS FINDINGS Hepatobiliary: No focal liver abnormality is seen. Status post cholecystectomy. No biliary dilatation. Pancreas: Unremarkable. No pancreatic ductal dilatation or surrounding inflammatory changes. Spleen: Normal in size without focal abnormality. Adrenals/Urinary Tract: Adrenal glands are within normal limits. Kidneys demonstrate a normal enhancement pattern bilaterally. No renal calculi or obstructive changes are noted. The bladder is well distended. Stomach/Bowel: Scattered diverticular change of the colon is noted without evidence of diverticulitis. No obstructive changes of the colon are seen. The appendix is not well visualized and may have been surgically removed. No inflammatory changes to suggest appendicitis are noted. Small bowel and stomach appear within normal limits. Vascular/Lymphatic: Aortic atherosclerosis. No enlarged abdominal or pelvic lymph nodes. Reproductive: Uterus is enlarged with multiple calcified and noncalcified uterine fibroids. No adnexal mass is seen. Other: No abdominal wall hernia or abnormality. No abdominopelvic ascites. Musculoskeletal: Degenerative changes of lumbar spine are noted. Review of the MIP images confirms the above findings. IMPRESSION: CTA of the chest: Changes consistent with bilateral pulmonary emboli primarily in the lower lobes bilaterally. Adjacent noted area of density in the right lower lobe is noted likely representing a pulmonary infarct. Right heart strain is noted as well (RV/LV ratio 1.5). The presence of right heart strain has been associated with an increased risk of morbidity and mortality. Please refer to the Code PE Focused order set in EPIC. 2.5 cm somewhat spiculated mass lesion identified within right middle lobe adjacent to the cardiac border. Associated right hilar and mediastinal lymph  nodes noted. These changes are consistent with pulmonary neoplasm till proven otherwise. PET-CT and tissue sampling is recommended for further evaluation. CT of the abdomen and pelvis: Diverticular change of the colon without evidence of diverticulitis. Multiple uterine fibroids. Critical Value/emergent results were called by telephone at the time of interpretation on 03/09/2023 at 7:35 pm to Lonni Camp PA, who verbally acknowledged these results. Electronically Signed   By: Oneil Devonshire M.D.   On: 03/09/2023 19:56   DG Chest Port 1 View Result Date: 03/09/2023 CLINICAL DATA:  10026 Shortness of breath 10026 EXAM: PORTABLE CHEST 1 VIEW COMPARISON:  02/11/2014 chest radiograph. FINDINGS: Low lung volumes. Stable cardiomediastinal silhouette with top-normal heart size. No pneumothorax. Small left pleural effusion. No right pleural effusion. No pulmonary edema. No consolidative airspace disease. IMPRESSION: Low lung volumes. Small left pleural effusion. Electronically Signed   By: Selinda DELENA Blue M.D.   On: 03/09/2023 17:34    EKG: Independently reviewed.  Normal axis sinus rhythm  Assessment/Plan Acute respiratory failure with hypoxia secondary to Pulmonary embolism (HCC) She was started on IV heparin . Tachycardia has improved, she still requiring  3 L of oxygen to keep saturations greater than 98%. She is not a candidate for thrombolytic therapy. Fevers likely due to PE.  Now resolved she has no leukocytosis. Consult PT OT. Avoid NSAIDs Check a 2D echo   Mass of middle lobe of right lung Will discuss with pulmonary to follow-up as an outpatient I do not think she is a candidate for bronchoscopy with biopsy due to her significant oxygen requirements in the setting of a new PE. This will have to be done as an outpatient.   Hypovolemic hyponatremia: Started on normal saline recheck basic metabolic panel in the morning   Normocytic anemia: Unclear etiology, her hemoglobin back in 2021 was 12.1.  Last  colonoscopy in 2015 by Dr. Aneita showed diverticulosis no masses. Now on admission is 9.4. She denies any melanotic stools or bright red blood per rectum   SLE (systemic lupus erythematosus related syndrome) (HCC) Currently on no medications     DVT Prophylaxis: heparin  Code Status: Full  Family Communication: husband  Disposition Plan: inpatient      It is my clinical opinion that admission to INPATIENT is reasonable and necessary in this 83 y.o. female acute PE with hypoxia  Given the aforementioned, the predictability of an adverse outcome is felt to be significant. I expect that the patient will require at least 2 midnights in the hospital to treat this condition.  Erle Odell Castor MD [facility]   03/10/2023, 8:19 AM

## 2023-03-10 NOTE — Discharge Instructions (Addendum)
 Information on my medicine - ELIQUIS  (apixaban )  This medication education was reviewed with me or my healthcare representative as part of my discharge preparation.    Why was Eliquis  prescribed for you? Eliquis  was prescribed to treat blood clots that may have been found in the veins of your legs (deep vein thrombosis) or in your lungs (pulmonary embolism) and to reduce the risk of them occurring again.  What do You need to know about Eliquis  ? The starting dose is 10 mg (two 5 mg tablets) taken TWICE daily for the FIRST SEVEN (7) DAYS, then on 03/17/2022 PM  the dose is reduced to ONE 5 mg tablet taken TWICE daily.  Eliquis  may be taken with or without food.   Try to take the dose about the same time in the morning and in the evening. If you have difficulty swallowing the tablet whole please discuss with your pharmacist how to take the medication safely.  Take Eliquis  exactly as prescribed and DO NOT stop taking Eliquis  without talking to the doctor who prescribed the medication.  Stopping may increase your risk of developing a new blood clot.  Refill your prescription before you run out.  After discharge, you should have regular check-up appointments with your healthcare provider that is prescribing your Eliquis .    What do you do if you miss a dose? If a dose of ELIQUIS  is not taken at the scheduled time, take it as soon as possible on the same day and twice-daily administration should be resumed. The dose should not be doubled to make up for a missed dose.  Important Safety Information A possible side effect of Eliquis  is bleeding. You should call your healthcare provider right away if you experience any of the following: Bleeding from an injury or your nose that does not stop. Unusual colored urine (red or dark brown) or unusual colored stools (red or black). Unusual bruising for unknown reasons. A serious fall or if you hit your head (even if there is no bleeding).  Some  medicines may interact with Eliquis  and might increase your risk of bleeding or clotting while on Eliquis . To help avoid this, consult your healthcare provider or pharmacist prior to using any new prescription or non-prescription medications, including herbals, vitamins, non-steroidal anti-inflammatory drugs (NSAIDs) and supplements.  This website has more information on Eliquis  (apixaban ): http://[email or web address] .com/eliquis dena

## 2023-03-10 NOTE — Telephone Encounter (Signed)
 LVMTCB to schedule appointment.

## 2023-03-10 NOTE — Progress Notes (Signed)
 Mobility Specialist Progress Note:   03/10/23 1400  Mobility  Activity Ambulated with assistance in hallway  Level of Assistance Contact guard assist, steadying assist  Assistive Device None  Distance Ambulated (ft) 150 ft  Activity Response Tolerated well  Mobility Referral Yes  Mobility visit 1 Mobility  Mobility Specialist Start Time (ACUTE ONLY) 1348  Mobility Specialist Stop Time (ACUTE ONLY) 1415  Mobility Specialist Time Calculation (min) (ACUTE ONLY) 27 min    Pre Mobility:  90% SpO2 RA During Mobility:  80% SpO2 RA                            90% SpO2 3L Post Mobility:  94% SpO2 1L  Pt received in bed, agreeable to mobility. Desat to 80% SpO2 during ambulation on RA. Required 3L O2 to return [facility]. Asymptomatic throughout. Successful void in BR before returning to bed. Pt left in bed with call bell and family present.  D'Vante Nicholaus Mobility Specialist Please contact via Special Educational Needs Teacher or Rehab office at [phone]

## 2023-03-10 NOTE — Progress Notes (Signed)
 Nurse requested Mobility Specialist to perform oxygen saturation test with pt which includes removing pt from oxygen both at rest and while ambulating.  Below are the results from that testing.     Patient Saturations on Room Air at Rest = spO2 90%  Patient Saturations on Room Air while Ambulating = sp02 80% .    Patient Saturations on 3 Liters of oxygen while Ambulating = sp02 90%  At end of testing pt left in room on 1  Liter of oxygen.  Reported results to nurse.   D'Vante Nicholaus Mobility Specialist Please contact via Special Educational Needs Teacher or Rehab office at [phone]

## 2023-03-10 NOTE — Plan of Care (Signed)
  Problem: Activity: Goal: Risk for activity intolerance will decrease Outcome: Progressing   Problem: Nutrition: Goal: Adequate nutrition will be maintained Outcome: Progressing   Problem: Coping: Goal: Level of anxiety will decrease Outcome: Progressing   Problem: Elimination: Goal: Will not experience complications related to bowel motility Outcome: Progressing Goal: Will not experience complications related to urinary retention Outcome: Progressing   Problem: Pain Management: Goal: General experience of comfort will improve Outcome: Progressing   Problem: Safety: Goal: Ability to remain free from injury will improve Outcome: Progressing

## 2023-03-11 ENCOUNTER — Inpatient Hospital Stay (HOSPITAL_COMMUNITY): Payer: Medicare HMO

## 2023-03-11 ENCOUNTER — Telehealth (HOSPITAL_COMMUNITY): Payer: Self-pay | Admitting: Pharmacy Technician

## 2023-03-11 ENCOUNTER — Other Ambulatory Visit (HOSPITAL_COMMUNITY): Payer: Self-pay

## 2023-03-11 DIAGNOSIS — R911 Solitary pulmonary nodule: Secondary | ICD-10-CM | POA: Diagnosis not present

## 2023-03-11 DIAGNOSIS — Z86711 Personal history of pulmonary embolism: Secondary | ICD-10-CM

## 2023-03-11 DIAGNOSIS — I82A13 Acute embolism and thrombosis of axillary vein, bilateral: Secondary | ICD-10-CM | POA: Diagnosis not present

## 2023-03-11 DIAGNOSIS — I2699 Other pulmonary embolism without acute cor pulmonale: Secondary | ICD-10-CM

## 2023-03-11 DIAGNOSIS — I2609 Other pulmonary embolism with acute cor pulmonale: Secondary | ICD-10-CM | POA: Diagnosis not present

## 2023-03-11 LAB — BASIC METABOLIC PANEL
Anion gap: 7 (ref 5–15)
BUN: 14 mg/dL (ref 8–23)
CO2: 18 mmol/L — ABNORMAL LOW (ref 22–32)
Calcium: 8.1 mg/dL — ABNORMAL LOW (ref 8.9–10.3)
Chloride: 106 mmol/L (ref 98–111)
Creatinine, Ser: 0.94 mg/dL (ref 0.44–1.00)
GFR, Estimated: >60 mL/min (ref >60)
Glucose, Bld: 99 mg/dL (ref 70–99)
Potassium: 4 mmol/L (ref 3.5–5.1)
Sodium: 131 mmol/L — ABNORMAL LOW (ref 135–145)

## 2023-03-11 LAB — IRON AND TIBC
Iron: 20 ug/dL — ABNORMAL LOW (ref 28–170)
Saturation Ratios: 10 % — ABNORMAL LOW (ref 10.4–31.8)
TIBC: 202 ug/dL — ABNORMAL LOW (ref 250–450)
UIBC: 182 ug/dL

## 2023-03-11 LAB — CBC
HCT: 27.7 % — ABNORMAL LOW (ref 36.0–46.0)
Hemoglobin: 8.9 g/dL — ABNORMAL LOW (ref 12.0–15.0)
MCH: 29.1 pg (ref 26.0–34.0)
MCHC: 32.1 g/dL (ref 30.0–36.0)
MCV: 90.5 fL (ref 80.0–100.0)
Platelets: 244 10*3/uL (ref 150–400)
RBC: 3.06 MIL/uL — ABNORMAL LOW (ref 3.87–5.11)
RDW: 12.6 % (ref 11.5–15.5)
WBC: 9.9 10*3/uL (ref 4.0–10.5)
nRBC: 0 % (ref 0.0–0.2)

## 2023-03-11 LAB — VITAMIN B12: Vitamin B-12: 976 pg/mL — ABNORMAL HIGH (ref 180–914)

## 2023-03-11 LAB — FERRITIN: Ferritin: 288 ng/mL (ref 11–307)

## 2023-03-11 LAB — FOLATE: Folate: 16.7 ng/mL (ref >5.9)

## 2023-03-11 LAB — VITAMIN D 25 HYDROXY (VIT D DEFICIENCY, FRACTURES): Vit D, 25-Hydroxy: 33.21 ng/mL (ref 30–100)

## 2023-03-11 MED ORDER — APIXABAN 5 MG PO TABS: 5.0000 mg | ORAL_TABLET | Freq: Two times a day (BID) | ORAL | Status: DC

## 2023-03-11 MED ORDER — APIXABAN 5 MG PO TABS
10.0000 mg | ORAL_TABLET | Freq: Two times a day (BID) | ORAL | Status: DC
  Administered 2023-03-11: 10 mg via ORAL
  Filled 2023-03-11: qty 2

## 2023-03-11 MED ORDER — APIXABAN (ELIQUIS) VTE STARTER PACK (10MG AND 5MG)
ORAL_TABLET | ORAL | 0 refills | Status: DC
  Filled 2023-03-11 (×2): qty 74, 28d supply, fill #0

## 2023-03-11 MED ORDER — HEPARIN (PORCINE) 25000 UT/250ML-% IV SOLN
1100.0000 [IU]/h | INTRAVENOUS | Status: DC
  Administered 2023-03-11: 1100 [IU]/h via INTRAVENOUS

## 2023-03-11 MED ORDER — APIXABAN (ELIQUIS) VTE STARTER PACK (10MG AND 5MG): ORAL_TABLET | ORAL | 0 refills | Status: DC

## 2023-03-11 MED ORDER — DONEPEZIL HCL 10 MG PO TABS: 10.0000 mg | ORAL_TABLET | Freq: Every day | ORAL | 1 refills | Status: DC

## 2023-03-11 NOTE — Discharge Summary (Signed)
 Physician Discharge Summary  Patient ID: Tara Martin MRN: [id number] DOB/AGE: [birth year] 82 y.o.  Admit date: 03/09/2023 Discharge date: 03/11/2023  Admission Diagnoses:  Discharge Diagnoses:  Principal Problem:   Pulmonary embolism (HCC) Active Problems:   SLE (systemic lupus erythematosus related syndrome) (HCC)   Mass of middle lobe of right lung   Acute respiratory failure with hypoxia (HCC)   Acute pulmonary embolism (HCC)   Discharged Condition: stable  Hospital Course: Patient is an 83 year old female with history of lupus, been on treatment, cognitive impairment and hypertension.  Patient was admitted with shortness of breath.  CT angio of the chest done revealed bilateral pulmonary embolism and 2.5 cm spiculated mass lesion in the right middle lobe with associated hilar and mediastinal lymphadenopathy.  Doppler ultrasound of the lower extremities revealed bilateral acute deep venous thrombosis.  Patient is currently on room air.  Patient has been cleared for discharge by the pulmonary team.  Patient be discharged on Eliquis .  Acute respiratory failure with hypoxia secondary to Pulmonary embolism (HCC)/bilateral acute DVT of lower extremities: -Patient was initially managed with heparin . -Patient has improved significantly. -Patient was seen by the pulmonary/ICU team. -Patient is off supplemental oxygen. -Patient has been cleared for discharge. -Patient be discharged back, on Eliquis . -Echocardiogram is nonrevealing. -Patient will follow-up with pulmonary team on discharge.     Mass of middle lobe of right lung -Patient will follow-up with pulmonary team on outpatient basis for further workup.    Hyponatremia: -Likely multifactorial. -Urine specific gravity of 1.045. -Finding of spiculated lung mass as documented above. -Suspect combined volume depletion and possible SIADH. -Continue to monitor sodium level after adequate hydration. -For further workup if  hyponatremia persists.    Normocytic anemia: -MCV of 88.1 -Possible multifactorial. -Follow B12 level, folate and iron panel. -Patient also has history of lupus.     SLE (systemic lupus erythematosus related syndrome) (HCC) -Currently on no medications -Depending on the goal of care, PCP may consider referral to a rheumatologist.   Consults: pulmonary/intensive care.  Patient was seen by the pulmonary/ICU team.  The pulmonary team has cleared patient for discharge, and recommended discharging patient on Eliquis .  Patient will follow-up with the pulmonary team on discharge for further workup of the lung mass and the pulmonary embolism.  Significant Diagnostic Studies:  CT ANGIOGRAPHY CHEST   CT ABDOMEN AND PELVIS WITH CONTRAST   TECHNIQUE: Multidetector CT imaging of the chest was performed using the standard protocol during bolus administration of intravenous contrast. Multiplanar CT image reconstructions and MIPs were obtained to evaluate the vascular anatomy. Multidetector CT imaging of the abdomen and pelvis was performed using the standard protocol during bolus administration of intravenous contrast.   RADIATION DOSE REDUCTION: This exam was performed according to the departmental dose-optimization program which includes automated exposure control, adjustment of the mA and/or kV according to patient size and/or use of iterative reconstruction technique.   CONTRAST:  80mL OMNIPAQUE  IOHEXOL  350 MG/ML SOLN   COMPARISON:  Chest x-ray from earlier in the same day.   FINDINGS: CTA CHEST FINDINGS   Cardiovascular: Thoracic aorta is well visualize without evidence of aneurysmal dilatation or dissection. Mild atherosclerotic calcifications are seen. Pulmonary artery is well visualized with evidence of bilateral pulmonary emboli left slightly greater than right particularly in the lower lobes. RV/LV ratio is elevated at 1.5 consistent with right heart strain. Mild coronary  calcifications are noted.   Mediastinum/Nodes: Thoracic inlet is within normal limits. Esophagus as visualized is within normal limits.  Scattered mediastinal lymph nodes are noted in the pretracheal and precarinal region as well as a large subcarinal node which measures 10 mm in short axis. Additionally extensive right hilar lymph nodes are noted. Additionally right middle lobe there is a 2.5 cm solid spiculated lesion medially along the right heart border. This is new from a prior CT examination of 2015 and suspicious for neoplasm till proven otherwise.   Lungs/Pleura: Emphysematous changes are noted bilaterally. Rounded basilar density is noted in the right lower lobe suspicious for pulmonary infarct given the underlying pulmonary emboli. Left basilar atelectasis is noted as well.   Musculoskeletal: Degenerative changes of the thoracic spine are noted.   Review of the MIP images confirms the above findings.   CT ABDOMEN and PELVIS FINDINGS   Hepatobiliary: No focal liver abnormality is seen. Status post cholecystectomy. No biliary dilatation.   Pancreas: Unremarkable. No pancreatic ductal dilatation or surrounding inflammatory changes.   Spleen: Normal in size without focal abnormality.   Adrenals/Urinary Tract: Adrenal glands are within normal limits. Kidneys demonstrate a normal enhancement pattern bilaterally. No renal calculi or obstructive changes are noted. The bladder is well distended.   Stomach/Bowel: Scattered diverticular change of the colon is noted without evidence of diverticulitis. No obstructive changes of the colon are seen. The appendix is not well visualized and may have been surgically removed. No inflammatory changes to suggest appendicitis are noted. Small bowel and stomach appear within normal limits.   Vascular/Lymphatic: Aortic atherosclerosis. No enlarged abdominal or pelvic lymph nodes.   Reproductive: Uterus is enlarged with multiple  calcified and noncalcified uterine fibroids. No adnexal mass is seen.   Other: No abdominal wall hernia or abnormality. No abdominopelvic ascites.   Musculoskeletal: Degenerative changes of lumbar spine are noted.   Review of the MIP images confirms the above findings.   IMPRESSION: CTA of the chest: Changes consistent with bilateral pulmonary emboli primarily in the lower lobes bilaterally. Adjacent noted area of density in the right lower lobe is noted likely representing a pulmonary infarct. Right heart strain is noted as well (RV/LV ratio 1.5). The presence of right heart strain has been associated with an increased risk of morbidity and mortality. Please refer to the Code PE Focused order set in EPIC.   2.5 cm somewhat spiculated mass lesion identified within right middle lobe adjacent to the cardiac border. Associated right hilar and mediastinal lymph nodes noted. These changes are consistent with pulmonary neoplasm till proven otherwise. PET-CT and tissue sampling is recommended for further evaluation.   CT of the abdomen and pelvis: Diverticular change of the colon without evidence of diverticulitis.   Multiple uterine fibroids.   Critical Value/emergent results were called by telephone at the time of interpretation on 03/09/2023 at 7:35 pm to Lonni Camp PA, who verbally acknowledged these results.     Electronically Signed   By: Oneil Devonshire M.D.   On: 03/09/2023 19:56    Echocardiogram revealed: 1. Left ventricular ejection fraction, by estimation, is 60 to 65%. The  left ventricle has normal function. The left ventricle has no regional  wall motion abnormalities. Left ventricular diastolic parameters were  normal.   2. Right ventricular systolic function is normal. The right ventricular  size is normal. There is normal pulmonary artery systolic pressure. The  estimated right ventricular systolic pressure is 28.0 mmHg.   3. The mitral valve is normal in  structure. Trivial mitral valve  regurgitation. No evidence of mitral stenosis.   4. The aortic valve  is normal in structure. Aortic valve regurgitation is  not visualized. No aortic stenosis is present.   5. The inferior vena cava is normal in size with greater than 50%  respiratory variability, suggesting right atrial pressure of 3 mmHg.    Treatments: Heparin  drip.  Patient be discharged on Eliquis .  Discharge Exam: Blood pressure (!) 112/58, pulse 76, temperature 97.6 F (36.4 C), temperature source Oral, resp. rate 18, height 5' 4 (1.626 m), weight 66.8 kg, SpO2 93%.   Disposition: Discharge disposition: 01-Home or Self Care       Discharge Instructions     Diet - low sodium heart healthy   Complete by: As directed    Increase activity slowly   Complete by: As directed       Allergies as of 03/11/2023       Reactions   Codeine Other (See Comments)   Per patient made her flip out        Medication List     STOP taking these medications    cefUROXime 250 MG tablet Commonly known as: CEFTIN       TAKE these medications    ALPRAZolam  0.5 MG tablet Commonly known as: XANAX  Take 0.5 mg by mouth daily as needed for anxiety.   Apixaban  Starter Pack (10mg  and 5mg ) Commonly known as: ELIQUIS  STARTER PACK Take as directed on package: start with two-5mg  tablets twice daily for 7 days. On day 8, switch to one-5mg  tablet twice daily.   diclofenac  sodium 1 % Gel Commonly known as: VOLTAREN  Apply 3 gm to 3 large joints up to 3 times a day.Dispense 3 tubes with 3 refills.   donepezil  10 MG tablet Commonly known as: Aricept  Take 1 tablet (10 mg total) by mouth at bedtime.   estradiol 0.1 MG/GM vaginal cream Commonly known as: ESTRACE Place 1 Applicatorful vaginally 3 (three) times a week.   Multivitamin Women 50+ Tabs Take 1 tablet by mouth daily.       Time spent: 35 minutes.  SignedBETHA Leatrice LILLETTE Rosario 03/11/2023, 3:25 PM

## 2023-03-11 NOTE — Progress Notes (Signed)
 NAME:  Tara Martin, MRN:  [id number], DOB:  Jul 14, 1940, LOS: 1 ADMISSION DATE:  03/09/2023, CONSULTATION DATE:  03/10/23 REFERRING MD:  Celinda CHIEF COMPLAINT:  PE and Lung Mass   History of Present Illness:  Tara Martin is a 83 y.o. female who has a PMH as below. She was admitted to [facility] 1/1 with dyspnea and was found to have bilateral PE in lower lobes as well as a 2.5cm spiculated mass lesion in the RML with hilar and mediastinal lymphadenopathy.  She was place don 4L O2 and was admitted by [facility]. She has never required supplemental O2 in the past.  PCCM called in consultation 1/2 for further recs.  She denies any prior hx of malignancy or VTE. No recent long trips, hemoptysis, weight loss, LE edema. She does not smoke and has never smoked before. No significant second hand smoke exposure. She is currently on 2L O2 with sats high 90s. Denies chest pain, lightheadedness. Dyspnea slightly improved. She is currently on a Heparin  infusion.  Pertinent  Medical History:  has SLE (systemic lupus erythematosus related syndrome) (HCC); Anxiety state; Prediabetes; Facial numbness; Acute midline low back pain with bilateral sciatica; MCI (mild cognitive impairment); Pulmonary embolism (HCC); Mass of middle lobe of right lung; Acute respiratory failure with hypoxia (HCC); and Acute pulmonary embolism (HCC) on their problem list.  Significant Hospital Events: Including procedures, antibiotic start and stop dates in addition to other pertinent events   1/1 admit 1/2 pulmonary consult  Interim History / Subjective:  Febrile overnight- Tmax 102. Today she denies complaints.  She walked this morning without requiring oxygen and has been to the bathroom.  Her daughter reports she is more steady than when she initially presented.  Objective:  Blood pressure (!) 112/58, pulse 76, temperature 97.6 F (36.4 C), temperature source Oral, resp. rate 18, height 5' 4 (1.626 m), weight 66.8 kg, SpO2 93%.         Intake/Output Summary (Last 24 hours) at 03/11/2023 1311 Last data filed at 03/11/2023 9180 Gross per 24 hour  Intake 305.42 ml  Output 50 ml  Net 255.42 ml   Filed Weights   03/09/23 1642 03/10/23 0646 03/11/23 0529  Weight: 64 kg 65 kg 66.8 kg    Examination: General: Elderly woman lying in bed no acute distress Neuro: Awake, alert, answering some questions but not giving great details.  Normal speech.  Moving extremities HEENT: [place]/AT, eyes anicteric Cardiovascular: S1-S2, RRR Lungs: Breathing comfortably nasal cannula, CTAB Abdomen: Soft, nontender  musculoskeletal: No lower extremity edema Skin: Warm, dry, no rashes  LE US : DVTs bilaterally; age indeterminate.  Echo: normal RV function & sizue. LVEF 60-65%  Iron 20 Ferritin 288  H/H 8.9/27.7 Platelets 244   Resolved problem list:  Acute hypoxic respiratory failure   Assessment & Plan:   Bilateral lower lobe PEs with bilateral lower extremity age-indeterminate DVTs.  Concerned that she has provoked clots with a right middle lobe spiculated nodule concerning for cancer. -Discussed with family recommendation to transition to DOAC, Eliquis  preferred.  She does not have good insurance coverage for this.  I reviewed options of Xarelto versus Eliquis  versus Coumadin with Lovenox  bridge with her daughter.  Due to her husband's blindness and the patient's cognitive issues and no other family locally, she is very concerned about the ability to get Coumadin monitoring done to make this a safe therapy.  She and the patient's husband feel that paying extra for Eliquis  would be worth it.  The  husband is on Eliquis  and has had no issues being compliant with it.  She does not eat regularly making Xarelto unpredictable -Needs minimum 3 to 6 months of DOAC.  If she is confirmed to have cancer she will need treatment indefinitely while on cancer treatment.  Discussed this with her daughter and the patient.  We also discussed risk of  spontaneous or provoked bleeding on DOAC.  She needs to seek medical attention if she has any head injury or has unexpected bleeding while on anticoagulation.  Spiculated RML lung nodule concerning for primary lung cancer with mediastinal adenopathy -I had another discussion with patient and daughter today.  Planning for follow-up outpatient with Lauraine Lites, NP on 03/23/23 and follow-up CT scan following that per Dr. Lanny previous discussion.  At that time can discuss potential need for biopsy.  A biopsy would require discontinuation temporarily of DOAC, increasing the risk of doing this acutely.   Stable for discharge from a pulmonary standpoint. Communicated recommendations to Dr. Rosario. Rest of management per primary team.  Best practice (evaluated daily):  Per primary team.  Labs   CBC: Recent Labs  Lab 03/09/23 1647 03/09/23 2005 03/10/23 0339 03/11/23 0222  WBC 8.6  --  8.3 9.9  NEUTROABS 7.3  --  6.3  --   HGB 10.8* 9.5* 9.4* 8.9*  HCT 32.7* 28.0* 29.0* 27.7*  MCV 87.0  --  88.1 90.5  PLT 239  --  217 244    Basic Metabolic Panel: Recent Labs  Lab 03/09/23 1647 03/09/23 2005 03/10/23 0339 03/11/23 0222  NA 130* 128* 131* 131*  K 4.3 4.2 4.1 4.0  CL 97*  --  101 106  CO2 21*  --  21* 18*  GLUCOSE 120*  --  110* 99  BUN 21  --  20 14  CREATININE 0.81  --  0.74 0.94  CALCIUM  8.3*  --  7.9* 8.1*   GFR: Estimated Creatinine Clearance: 44.1 mL/min (by C-G formula based on SCr of 0.94 mg/dL). Recent Labs  Lab 03/09/23 1647 03/09/23 2000 03/10/23 0339 03/11/23 0222  PROCALCITON  --  0.15  --   --   WBC 8.6  --  8.3 9.9  LATICACIDVEN 1.2 1.7  --   --     Leita SHAUNNA Gaskins, DO 03/11/23 2:34 PM [facility]  For contact information, see Amion. If no response to pager, please call PCCM consult pager. After hours, 7PM- 7AM, please call Elink.

## 2023-03-11 NOTE — Progress Notes (Addendum)
 PHARMACY - ANTICOAGULATION CONSULT NOTE  Pharmacy Consult for apixaban   Indication: pulmonary embolus  Allergies  Allergen Reactions   Codeine Other (See Comments)    Per patient made her flip out    Patient Measurements: Height: 5' 4 (162.6 cm) Weight: 66.8 kg (147 lb 4.3 oz) IBW/kg (Calculated) : 54.7 Heparin  Dosing Weight: 64kg  Vital Signs: Temp: 97.6 F (36.4 C) (01/03 0749) Temp Source: Axillary (01/03 0749) BP: 132/68 (01/03 0749) Pulse Rate: 75 (01/03 0749)  Labs: Recent Labs    03/09/23 1647 03/09/23 2000 03/09/23 2005 03/09/23 2255 03/10/23 0339 03/10/23 0409 03/10/23 1159 03/11/23 0222  HGB 10.8*  --  9.5*  --  9.4*  --   --  8.9*  HCT 32.7*  --  28.0*  --  29.0*  --   --  27.7*  PLT 239  --   --   --  217  --   --  244  HEPARINUNFRC  --   --   --   --   --  >1.10* 0.39  --   CREATININE 0.81  --   --   --  0.74  --   --  0.94  TROPONINIHS 47* 132*  --  139* 80*  --   --   --     Estimated Creatinine Clearance: 44.1 mL/min (by C-G formula based on SCr of 0.94 mg/dL).   Medical History: Past Medical History:  Diagnosis Date   Anxiety state, unspecified - sees Dr. Rosalynn in gyn and benzo prescribed there 04/09/2013   Arthritis    Chicken pox    Lupus    MCI (mild cognitive impairment) 05/20/2020   Migraines    Prediabetes    Assessment: 83 yo F with PE. Patient received one dose of 10mg  apixaban  at 2100 on 1/2. Md now consulting pharmacy to resume heparin . Patient previously on heparin  drip at 1050 units/hr with a Hl of 0.39. Will resume heparin  at 1100 units/hr.   Apixaban  copay $152.71 per RX patient advocate team once patient resumed on apixaban    Goal of Therapy:  Heparin  level 0.3-0.7 APTT 66-102 Monitor platelets by anticoagulation protocol: Yes   Plan:  Resume heparin  at 1100 units/hr, no bolus Heparin  level and APTT in 8 hours Monitor for signs of bleeding Heparin  level and CBC daily   Tara Martin A. Lyle, PharmD, BCPS,  FNKF Clinical Pharmacist [place] Please utilize Amion for appropriate phone number to reach the unit pharmacist [facility])   Addendum:  D/w Dr. Gretta, patient willing to pay for apixaban . Will start transition to apixaban  now per Dr. Gretta. Apixaban  10mg  BID x 7 days, then 5mg  BID thereafter. Stop heparin  once first dose of apixaban  is given.   Tara Martin A. Lyle, PharmD, BCPS, FNKF Clinical Pharmacist  Please utilize Amion for appropriate phone number to reach the unit pharmacist [facility])

## 2023-03-11 NOTE — Telephone Encounter (Signed)
 [name]- I had to use a Shared Decision Slot on 1/15 11:00 to get her in w/I 7-10 days. Is that OK?

## 2023-03-11 NOTE — Evaluation (Signed)
 Occupational Therapy Evaluation Patient Details Name: Tara Martin MRN: [id number] DOB: September 30, 1940 Today's Date: 03/11/2023   History of Present Illness Pt is an 83 yo female who presents to [facility] on 03/09/2023 for SOB lasting several days, upon CT pt found to have bilateral PE in the lower lobes. PMH of lupus not on treatment, mild cognitive impairment (on Aricept ), essential hypertension   Clinical Impression   Pt admitted for above, per family she has baseline STM deficits which were noted on eval. Pt also having a harder time with mathematical calculations, discussed use of writing things down and journaling to improve recall. Pt completing ADLs appropriately with supervision. Discussed benefits of HH services for cognitive strategies in home. Patient would benefit from post acute Home OT services to help maximize functional independence in natural environment        If plan is discharge home, recommend the following: Supervision due to cognitive status;Direct supervision/assist for financial management;Direct supervision/assist for medications management    Functional Status Assessment  Patient has had a recent decline in their functional status and demonstrates the ability to make significant improvements in function in a reasonable and predictable amount of time.  Equipment Recommendations  Other (comment) (RW)    Recommendations for Other Services       Precautions / Restrictions Precautions Precautions: Fall Restrictions Weight Bearing Restrictions Per Provider Order: No      Mobility Bed Mobility               General bed mobility comments: Pt half sitting EOB    Transfers Overall transfer level: Needs assistance Equipment used: Rolling walker (2 wheels) Transfers: Sit to/from Stand Sit to Stand: Supervision                  Balance Overall balance assessment: Mild deficits observed, not formally tested                                          ADL either performed or assessed with clinical judgement   ADL Overall ADL's : Needs assistance/impaired Eating/Feeding: Independent;Sitting   Grooming: Standing;Wash/dry face;Oral care;Supervision/safety   Upper Body Bathing: Sitting;Set up   Lower Body Bathing: Sitting/lateral leans;Supervison/ safety;Set up   Upper Body Dressing : Sitting;Set up   Lower Body Dressing: Sitting/lateral leans;Set up   Toilet Transfer: Supervision/safety;Rolling walker (2 wheels);Ambulation   Toileting- Clothing Manipulation and Hygiene: Supervision/safety;Sit to/from stand       Functional mobility during ADLs: Supervision/safety;Rolling walker (2 wheels)       Vision         Perception         Praxis         Pertinent Vitals/Pain Pain Assessment Pain Assessment: No/denies pain     Extremity/Trunk Assessment Upper Extremity Assessment Upper Extremity Assessment: Overall WFL for tasks assessed   Lower Extremity Assessment Lower Extremity Assessment: Overall WFL for tasks assessed   Cervical / Trunk Assessment Cervical / Trunk Assessment: Normal   Communication Communication Communication: No apparent difficulties Cueing Techniques: Verbal cues   Cognition Arousal: Alert Behavior During Therapy: WFL for tasks assessed/performed                                   General Comments: Pt family reports she has STM deficits and ADHD at baseline. Administered excerpts  from SLUMS and SBT, pt having strong trouble with STM sections and some mathematical challenges     General Comments  VSS on .5L    Exercises     Shoulder Instructions      Home Living Family/patient expects to be discharged to:: Private residence Living Arrangements: Spouse/significant other Available Help at Discharge: Family;Friend(s);Available PRN/intermittently (friends and spouses daughter is going to stay for a few days) Type of Home: House Home Access: Stairs to  enter [employer] of Steps: 3 on the side and 5in the front one rail on side with brick wall and bil rails in front Entrance Stairs-Rails: Left Home Layout: Two level;Able to live on main level with bedroom/bathroom     Bathroom Shower/Tub: Tub/shower unit   Bathroom Toilet: Handicapped height     Home Equipment: BSC/3in1;Shower seat   Additional Comments: lives with spouse who is visually impaired. Daughter reports she can stay as long as needed      Prior Functioning/Environment Prior Level of Function : Independent/Modified Independent;Driving             Mobility Comments: Pt reports no use of AD prior to hospitalization. ADLs Comments: Was getting groceries, cooking getting laundry        OT Problem List: Impaired balance (sitting and/or standing);Cardiopulmonary status limiting activity      OT Treatment/Interventions: Self-care/ADL training;Therapeutic exercise;Patient/family education;Therapeutic activities;DME and/or AE instruction;Cognitive remediation/compensation    OT Goals(Current goals can be found in the care plan section) Acute Rehab OT Goals Patient Stated Goal: To go home OT Goal Formulation: With patient/family Time For Goal Achievement: 03/25/23 Potential to Achieve Goals: Good  OT Frequency: Min 1X/week    Co-evaluation              AM-PAC OT 6 Clicks Daily Activity     Outcome Measure Help from another person eating meals?: None Help from another person taking care of personal grooming?: A Little Help from another person toileting, which includes using toliet, bedpan, or urinal?: A Little Help from another person bathing (including washing, rinsing, drying)?: A Little Help from another person to put on and taking off regular upper body clothing?: A Little Help from another person to put on and taking off regular lower body clothing?: A Little 6 Click Score: 19   End of Session Equipment Utilized During Treatment: Gait  belt;Rolling walker (2 wheels) Nurse Communication: Mobility status  Activity Tolerance: Patient tolerated treatment well Patient left: in bed;with call bell/phone within reach;with family/visitor present  OT Visit Diagnosis: Unsteadiness on feet (R26.81)                Time: 0927-1000 OT Time Calculation (min): 33 min Charges:  OT General Charges $OT Visit: 1 Visit OT Evaluation $OT Eval Low Complexity: 1 Low OT Treatments $Therapeutic Activity: 8-22 mins  03/11/2023  AB, OTR/L  Acute Rehabilitation Services  Office: [phone]   Tara Martin 03/11/2023, 10:21 AM

## 2023-03-11 NOTE — Progress Notes (Signed)
 AVS printed and reviewed with patient and family. All questions answered.    Patient RN to continue with Medication administrations that are needed before discharge.

## 2023-03-11 NOTE — Plan of Care (Signed)
  Problem: Education: Goal: Knowledge of General Education information will improve Description: Including pain rating scale, medication(s)/side effects and non-pharmacologic comfort measures Outcome: Progressing   Problem: Health Behavior/Discharge Planning: Goal: Ability to manage health-related needs will improve Outcome: Progressing   Problem: Clinical Measurements: Goal: Ability to maintain clinical measurements within normal limits will improve Outcome: Progressing Goal: Will remain free from infection Outcome: Progressing Goal: Diagnostic test results will improve Outcome: Progressing Goal: Respiratory complications will improve Outcome: Progressing Goal: Cardiovascular complication will be avoided Outcome: Progressing   Problem: Activity: Goal: Risk for activity intolerance will decrease Outcome: Progressing   Problem: Nutrition: Goal: Adequate nutrition will be maintained Outcome: Progressing   Problem: Coping: Goal: Level of anxiety will decrease Outcome: Progressing   Problem: Elimination: Goal: Will not experience complications related to bowel motility Outcome: Progressing Goal: Will not experience complications related to urinary retention Outcome: Progressing   Problem: Pain Management: Goal: General experience of comfort will improve Outcome: Progressing   Problem: Safety: Goal: Ability to remain free from injury will improve Outcome: Progressing   Problem: Skin Integrity: Goal: Risk for impaired skin integrity will decrease Outcome: Progressing   Problem: Education: Goal: Ability to state activities that reduce stress will improve Outcome: Progressing   Problem: Coping: Goal: Ability to identify and develop effective coping behavior will improve Outcome: Progressing   Problem: Self-Concept: Goal: Ability to identify factors that promote anxiety will improve Outcome: Progressing Goal: Level of anxiety will decrease Outcome:  Progressing Goal: Ability to modify response to factors that promote anxiety will improve Outcome: Progressing

## 2023-03-11 NOTE — Evaluation (Signed)
 Physical Therapy Evaluation Patient Details Name: Tara Martin MRN: [id number] DOB: 09/09/1940 Today's Date: 03/11/2023  History of Present Illness  Pt is an 83 yo female who presents to [facility] on 03/09/2023 for SOB lasting several days, upon CT pt found to have bilateral PE in the lower lobes. PMH of lupus not on treatment, mild cognitive impairment (on Aricept ), essential hypertension  Clinical Impression  Pt is presenting slightly below baseline level of functioning. Prior to hospitalization pt was ind with all functional activities, ADL's and IADL's. Currently pt is supervision for sit to stand and gait with O2 sats right around 88% when ambulating and slightly below when at rest. Pt spouse is very supportive but visually impaired and will need some assistance in assisting spouse. Daughter is going to come for a few days for support. Due to pt current functional status, home set up and available assistance at home no recommended skilled physical therapy services at this time on discharge from acute care hospital setting. Will continue to follow in acute setting in order to ensure that pt returns home with decreased risk for falls, injury, re-hospitalization and improved activity tolerance.          If plan is discharge home, recommend the following: Help with stairs or ramp for entrance;Assist for transportation     Equipment Recommendations None recommended by PT     Functional Status Assessment Patient has had a recent decline in their functional status and demonstrates the ability to make significant improvements in function in a reasonable and predictable amount of time.     Precautions / Restrictions Precautions Precautions: Fall Restrictions Weight Bearing Restrictions Per Provider Order: No      Mobility  Bed Mobility Overal bed mobility: Modified Independent   General bed mobility comments: supine>sitting    Transfers Overall transfer level: Needs assistance Equipment  used: None Transfers: Sit to/from Stand Sit to Stand: Supervision           General transfer comment: supervision for safety.    Ambulation/Gait Ambulation/Gait assistance: Supervision Gait Distance (Feet): 350 Feet Assistive device: None Gait Pattern/deviations: Step-through pattern, Decreased step length - right, Decreased step length - left Gait velocity: decreased Gait velocity interpretation: 1.31 - 2.62 ft/sec, indicative of limited community ambulator   General Gait Details: very short steps with frontal plane sway no overt LOB, low foot clearance.  Stairs Stairs:  (pt demonstrates ability through sit to stand and gait to perform stairs per home set up with rails and supervision to CGA level assist)             Balance Overall balance assessment: Mild deficits observed, not formally tested         Pertinent Vitals/Pain Pain Assessment Pain Assessment: No/denies pain    Home Living Family/patient expects to be discharged to:: Private residence Living Arrangements: Spouse/significant other Available Help at Discharge: Family;Friend(s);Available PRN/intermittently (friends and spouses daughter is going to stay for a few days) Type of Home: House Home Access: Stairs to enter Entrance Stairs-Rails: Left Entrance Stairs-Number of Steps: 3 on the side and 5in the front one rail on side with brick wall and bil rails in front   Home Layout: Two level;Able to live on main level with bedroom/bathroom Home Equipment: BSC/3in1;Shower seat Additional Comments: lives with spouse who is visually impaired.    Prior Function Prior Level of Function : Independent/Modified Independent;Driving     Mobility Comments: Pt reports no use of AD prior to hospitalization. ADLs Comments: Was getting  groceries, cooking office manager     Extremity/Trunk Assessment   Upper Extremity Assessment Upper Extremity Assessment: Defer to OT evaluation    Lower Extremity  Assessment Lower Extremity Assessment: Overall WFL for tasks assessed    Cervical / Trunk Assessment Cervical / Trunk Assessment: Normal  Communication   Communication Communication: No apparent difficulties  Cognition Arousal: Alert Behavior During Therapy: WFL for tasks assessed/performed Overall Cognitive Status: Within Functional Limits for tasks assessed     General Comments: occasionally repeats herself; told me she was keeping juice and fruit cup 3x during session.        General Comments General comments (skin integrity, edema, etc.): Pt O2 sats remained 88% and above on room air during gait. In sitting O2 sats intermittently down to 87% with good pleth line.        Assessment/Plan    PT Assessment Patient needs continued PT services  PT Problem List Decreased balance;Decreased activity tolerance       PT Treatment Interventions DME instruction;Therapeutic activities;Gait training;Therapeutic exercise;Stair training;Balance training;Functional mobility training;Patient/family education    PT Goals (Current goals can be found in the Care Plan section)  Acute Rehab PT Goals Patient Stated Goal: to return home with family PT Goal Formulation: With patient/family Time For Goal Achievement: 03/25/23 Potential to Achieve Goals: Good    Frequency Min 1X/week        AM-PAC PT 6 Clicks Mobility  Outcome Measure Help needed turning from your back to your side while in a flat bed without using bedrails?: None Help needed moving from lying on your back to sitting on the side of a flat bed without using bedrails?: None Help needed moving to and from a bed to a chair (including a wheelchair)?: A Little Help needed standing up from a chair using your arms (e.g., wheelchair or bedside chair)?: A Little Help needed to walk in hospital room?: A Little Help needed climbing 3-5 steps with a railing? : A Little 6 Click Score: 20    End of Session Equipment Utilized During  Treatment: Gait belt Activity Tolerance: Patient tolerated treatment well Patient left: in bed;with call bell/phone within reach;with family/visitor present Nurse Communication: Mobility status PT Visit Diagnosis: Unsteadiness on feet (R26.81)    Time: 9151-9085 PT Time Calculation (min) (ACUTE ONLY): 26 min   Charges:   PT Evaluation $PT Eval Low Complexity: 1 Low PT Treatments $Therapeutic Activity: 8-22 mins PT General Charges $$ ACUTE PT VISIT: 1 Visit         Dorothyann Maier, DPT, CLT  Acute Rehabilitation Services Office: [phone] (Secure chat preferred)   Dorothyann VEAR Maier 03/11/2023, 9:19 AM

## 2023-03-11 NOTE — Telephone Encounter (Addendum)
 Patient Product/process Development Scientist completed.    The patient is insured through [employer]. Patient has Medicare and is not eligible for a copay card, but may be able to apply for patient assistance or Medicare RX Payment Plan (Patient Must reach out to their plan, if eligible for payment plan), if available.    Ran test claim for Eliquis  5 mg and the current 30 day co-pay is $152.71.  Ran test claim for Xarelto 20 mg and the current 30 day co-pay is $143.46.  This test claim was processed through [facility]- copay amounts may vary at other pharmacies due to pharmacy/plan contracts, or as the patient moves through the different stages of their insurance plan.     Reyes Sharps, CPHT Pharmacy Technician III Certified Patient Advocate [facility] Patient Advocate Team Direct Number: [phone]  Fax: [phone]

## 2023-03-11 NOTE — Progress Notes (Signed)
 Bilateral lower extremity venous duplex has been completed.  Results can be found in chart review under CV Proc.  03/11/2023 12:47 PM  [place] Wherry Durenda Age, RVT.

## 2023-03-14 ENCOUNTER — Telehealth: Payer: Self-pay

## 2023-03-14 ENCOUNTER — Telehealth: Payer: Self-pay | Admitting: *Deleted

## 2023-03-14 LAB — ANTIPHOSPHOLIPID SYNDROME EVAL, BLD
Anticardiolipin IgA: <9 [APL'U]/mL (ref 0–11)
Anticardiolipin IgG: <9 [GPL'U]/mL (ref 0–14)
Anticardiolipin IgM: <9 [MPL'U]/mL (ref 0–12)
DRVVT: 110.3 s — ABNORMAL HIGH (ref 0.0–47.0)
PTT Lupus Anticoagulant: 52.6 s — ABNORMAL HIGH (ref 0.0–43.5)
Phosphatydalserine, IgA: <1 {APS'U} (ref 0–19)
Phosphatydalserine, IgG: 9 U (ref 0–30)
Phosphatydalserine, IgM: 19 U (ref 0–30)

## 2023-03-14 LAB — CULTURE, BLOOD (SINGLE)
Culture: NO GROWTH
Special Requests: ADEQUATE

## 2023-03-14 LAB — DRVVT MIX: dRVVT Mix: 64.8 s — ABNORMAL HIGH (ref 0.0–40.4)

## 2023-03-14 LAB — DRVVT CONFIRM: dRVVT Confirm: 1 {ratio} (ref 0.8–1.2)

## 2023-03-14 LAB — HEXAGONAL PHASE PHOSPHOLIPID: Hexagonal Phase Phospholipid: 9 s (ref 0–11)

## 2023-03-14 LAB — PTT-LA MIX: PTT-LA Mix: 50 s — ABNORMAL HIGH (ref 0.0–40.5)

## 2023-03-14 NOTE — Transitions of Care (Post Inpatient/ED Visit) (Signed)
   03/14/2023  Name: Tara Martin MRN: [id number] DOB: November 29, 1940  Today's TOC FU Call Status: Today's TOC FU Call Status:: Unsuccessful Call (2nd Attempt) Unsuccessful Call (2nd Attempt) Date: 03/14/23  Attempted to reach the patient regarding the most recent Inpatient/ED visit.  Follow Up Plan: Additional outreach attempts will be made to reach the patient to complete the Transitions of Care (Post Inpatient/ED visit) call.     Rama Pilling, RN,BSN,CCM RN Care Manager Transitions of Care  [facility]  Direct Phone: [phone] Toll Free: [phone] Fax: [phone]

## 2023-03-14 NOTE — Transitions of Care (Post Inpatient/ED Visit) (Signed)
   03/14/2023  Name: Tara Martin MRN: [id number] DOB: 07-12-1940  Today's TOC FU Call Status: Today's TOC FU Call Status:: Unsuccessful Call (1st Attempt) Unsuccessful Call (1st Attempt) Date: 03/14/23  Attempted to reach the patient regarding the most recent Inpatient/ED visit.  Follow Up Plan: Additional outreach attempts will be made to reach the patient to complete the Transitions of Care (Post Inpatient/ED visit) call.   Cathlean Headland BSN RN Population Health- Transition of Care Team.  [facility] [phone]

## 2023-03-15 ENCOUNTER — Telehealth: Payer: Self-pay

## 2023-03-15 NOTE — Transitions of Care (Post Inpatient/ED Visit) (Signed)
   03/15/2023  Name: STEFFANY SCHOENFELDER MRN: [id number] DOB: May 13, 1940  Today's TOC FU Call Status: Today's TOC FU Call Status:: Unsuccessful Call (3rd Attempt) Unsuccessful Call (3rd Attempt) Date: 03/15/23  Attempted to reach the patient regarding the most recent Inpatient/ED visit.  Follow Up Plan: No further outreach attempts will be made at this time. We have been unable to contact the patient.  Barnie Gowda RN, BSN, CCM RN Care Manager  Transitions of Care  [facility]  [phone]

## 2023-03-18 ENCOUNTER — Ambulatory Visit: Payer: Medicare HMO | Admitting: Family Medicine

## 2023-03-18 ENCOUNTER — Encounter: Payer: Self-pay | Admitting: Family Medicine

## 2023-03-18 VITALS — BP 124/70 | HR 65 | Temp 97.9°F | Wt 141.6 lb

## 2023-03-18 DIAGNOSIS — I2699 Other pulmonary embolism without acute cor pulmonale: Secondary | ICD-10-CM | POA: Diagnosis not present

## 2023-03-18 DIAGNOSIS — R918 Other nonspecific abnormal finding of lung field: Secondary | ICD-10-CM | POA: Diagnosis not present

## 2023-03-18 DIAGNOSIS — R630 Anorexia: Secondary | ICD-10-CM | POA: Diagnosis not present

## 2023-03-18 DIAGNOSIS — E871 Hypo-osmolality and hyponatremia: Secondary | ICD-10-CM | POA: Diagnosis not present

## 2023-03-18 NOTE — Progress Notes (Signed)
 Established Patient Office Visit  Subjective   Patient ID: Tara Martin, female    DOB: March 30, 1940  Age: 83 y.o. MRN: [id number]  No chief complaint on file.   HPI   Tara Martin has history of cognitive impairment, prediabetes, reported systemic lupus erythematosus syndrome.  She recently had been feeling poorly .  She had decreased appetite and just prior to admission had mentioned some increased shortness of breath.  She was with her daughter on January 1 and preparing to go to the store when she had increased shortness of breath and they ended up going to urgent care.  While she was at the urgent care she reportedly turned blue .  At that point were transported to a local ER where they were evaluated for about 12 hours prior to admission.  She had low O2 sats.  CT angiogram of the chest revealed bilateral pulmonary emboli and 2.5 cm spiculated mass right middle lobe with associated hilar and mediastinal lymphadenopathy.  Doppler lower extremities revealed bilateral acute deep vein thrombosis.  She was initially placed on heparin  and then transitioned to Eliquis .  She is taking this twice daily.  Echocardiogram was unremarkable.  Currently stable without dyspnea.  Only very mild fleeting left chest pain left lateral lung region.  No pleuritic pain.  No fever.  Rare cough.  She had lung mass as above.  Pending follow-up with pulmonary next week.  Never smoked.  Has had poor appetite recently but weight relatively stable..  She had mild hyponatremia with sodium 131.  Concern for possible SIADH with her lung mass.  No diuretic therapy.  Normocytic anemia.  B12 and iron studies were normal.  She had positive lupus anticoagulant not sure regarding basis of her diagnosis of SLE.  CT scan abdomen and pelvis unremarkable.  Past Medical History:  Diagnosis Date   Anxiety state, unspecified - sees Dr. Rosalynn in gyn and benzo prescribed there 04/09/2013   Arthritis    Chicken pox    Lupus     MCI (mild cognitive impairment) 05/20/2020   Migraines    Prediabetes    Past Surgical History:  Procedure Laterality Date   BACK SURGERY  2000   CHOLECYSTECTOMY     DILATION AND CURETTAGE OF UTERUS  1973   GALLBLADDER SURGERY  1974    reports that she has never smoked. She has never used smokeless tobacco. She reports that she does not currently use alcohol after a past usage of about 2.0 standard drinks of alcohol per week. She reports that she does not use drugs. family history includes Alcoholism in an other family member; Alzheimer's disease in her brother and sister; Arthritis in her mother and another family member; COPD in her sister; Diabetes in her mother; Heart attack in her mother; Heart disease in her brother and mother; Hypertension in an other family member; Migraines in her daughter; Pneumonia in her father; Pulmonary embolism in her brother; Sudden death in an other family member. Allergies  Allergen Reactions   Codeine Other (See Comments)    Per patient made her flip out    Review of Systems  Constitutional:  Negative for chills, fever and weight loss.  Respiratory:  Negative for hemoptysis, shortness of breath and wheezing.   Cardiovascular:  Negative for chest pain.  Gastrointestinal:  Negative for abdominal pain and blood in stool.      Objective:     BP 124/70 (BP Location: Left Arm, Patient Position: Sitting, Cuff Size: Normal)  Pulse 65   Temp 97.9 F (36.6 C) (Oral)   Wt 141 lb 9.6 oz (64.2 kg)   SpO2 98%   BMI 24.31 kg/m  BP Readings from Last 3 Encounters:  03/18/23 124/70  03/11/23 (!) 112/58  02/14/23 (!) 142/68   Wt Readings from Last 3 Encounters:  03/18/23 141 lb 9.6 oz (64.2 kg)  03/11/23 147 lb 4.3 oz (66.8 kg)  02/14/23 149 lb 12.8 oz (67.9 kg)      Physical Exam Vitals reviewed.  Constitutional:      General: She is not in acute distress.    Appearance: She is not ill-appearing.  Cardiovascular:     Rate and Rhythm: Normal  rate and regular rhythm.  Pulmonary:     Effort: Pulmonary effort is normal.     Breath sounds: Normal breath sounds. No wheezing or rales.  Musculoskeletal:     Right lower leg: No edema.     Left lower leg: No edema.  Neurological:     General: No focal deficit present.     Mental Status: She is alert.      No results found for any visits on 03/18/23.  Last CBC Lab Results  Component Value Date   WBC 9.9 03/11/2023   HGB 8.9 (L) 03/11/2023   HCT 27.7 (L) 03/11/2023   MCV 90.5 03/11/2023   MCH 29.1 03/11/2023   RDW 12.6 03/11/2023   PLT 244 03/11/2023   Last metabolic panel Lab Results  Component Value Date   GLUCOSE 99 03/11/2023   NA 131 (L) 03/11/2023   K 4.0 03/11/2023   CL 106 03/11/2023   CO2 18 (L) 03/11/2023   BUN 14 03/11/2023   CREATININE 0.94 03/11/2023   GFRNONAA >60 03/11/2023   CALCIUM  8.1 (L) 03/11/2023   PROT 6.7 03/09/2023   ALBUMIN 3.6 03/09/2023   BILITOT 0.4 03/09/2023   ALKPHOS 78 03/09/2023   AST 45 (H) 03/09/2023   ALT 52 (H) 03/09/2023   ANIONGAP 7 03/11/2023   Last thyroid  functions Lab Results  Component Value Date   TSH 4.16 12/11/2019   Last vitamin B12 and Folate Lab Results  Component Value Date   VITAMINB12 976 (H) 03/11/2023   FOLATE 16.7 03/11/2023      The ASCVD Risk score (Arnett DK, et al., 2019) failed to calculate for the following reasons:   The 2019 ASCVD risk score is only valid for ages 88 to 59    Assessment & Plan:   #1 bilateral pulmonary emboli with venous Dopplers revealing also DVT.  No known family history of thrombosis.  Patient has no personal history of thromboembolic disorder.  Likely multifactorial with risk factors including age,?lupus hx, right lung mass.   Patient is non-smoker.  No current hormonal therapy.  No prolonged immobility.  She is on initiation pack for Eliquis  and will transition starting tomorrow to 5 mg twice daily.   She is symptomatically stable at this time and has O2 sats  today 98% room air  #2 hyponatremia.  This was mild with sodium levels of 130-131.  No thiazide use.  Probably related to recent poor intake and questionable SIADH component with her right lung mass.  Recommend repeat electrolytes in 3 to 4 weeks and follow-up.  Try to increase oral intake and not be overly restrictive of sodium at this time.  If progressive at follow-up check SIADH labs  #3 right lung mass-right middle lobe.  Pending workup with pulmonary.  She sees them next week.  #  4 normocytic anemia.  Suspect anemia of chronic disease .  Iron studies and B12 normal  Return in about 1 month (around 04/18/2023).    Wolm Scarlet, MD

## 2023-03-23 ENCOUNTER — Ambulatory Visit (INDEPENDENT_AMBULATORY_CARE_PROVIDER_SITE_OTHER): Payer: Medicare HMO | Admitting: Acute Care

## 2023-03-23 VITALS — BP 138/60 | HR 71 | Temp 98.0°F | Ht 64.5 in | Wt 134.8 lb

## 2023-03-23 DIAGNOSIS — I2699 Other pulmonary embolism without acute cor pulmonale: Secondary | ICD-10-CM

## 2023-03-23 DIAGNOSIS — R918 Other nonspecific abnormal finding of lung field: Secondary | ICD-10-CM

## 2023-03-23 MED ORDER — APIXABAN 5 MG PO TABS: 5.0000 mg | ORAL_TABLET | Freq: Two times a day (BID) | ORAL | 6 refills | Status: DC

## 2023-03-23 NOTE — Patient Instructions (Addendum)
It is good to see you today Continue your Eliquis 5 mg twice daily as you have been doing.  This is for your Pulmonary Embolism I have sent in refills as we discussed. We will do follow up CT chest 06/2023, with follow up PET scan 06/2023. You will come into the office to see either Dr. Delton Coombes or myself to go over the results within a week of the scan. Plan will be for biopsy in 3 months after you have been treated for your Pulmonary Embolism for some time before the procedure.  Call if you need [place] sooner, or for any bleeding issues.  Make sure you do not miss any doses of your Eliquis doses.  Call or seek emergency care  for any bleeding that will not stop after holding pressure.  Please contact office for sooner follow up if symptoms do not improve or worsen or seek emergency care

## 2023-03-23 NOTE — Progress Notes (Signed)
History of Present Illness Tara Martin is a 83 y.o. female never smoker with recent hospital admission for bilateral PE in lower lobes , as well as a 2.5cm spiculated mass lesion in the RML with hilar and mediastinal lymphadenopathy. She was seen as an inpatient by Dr. Delton Coombes.  Synopsis 83 year old female with history of lupus, she has been on treatment for this, she also has some cognitive impairment. Patient was admitted from 03/09/2023-03/11/2023 with shortness of breath. CT angio of the chest done revealed bilateral pulmonary embolism with right heart strain,  and a 2.5 cm spiculated mass lesion in the right middle lobe with associated hilar and mediastinal lymphadenopathy. Doppler ultrasound of the lower extremities revealed bilateral acute deep venous thrombosis. She did initially require oxygen, however was discharged on RA .She is on Eliquis for her PE. She is compliant with this.She understands that she cannot miss a single dose. She does still complain of some chest pain associated with this.    Pertinent  Medical History:  has SLE (systemic lupus erythematosus related syndrome) (HCC); Anxiety state; Prediabetes; Facial numbness; Acute midline low back pain with bilateral sciatica; MCI (mild cognitive impairment); Pulmonary embolism (HCC); Mass of middle lobe of right lung; Acute respiratory failure with hypoxia (HCC); and Acute pulmonary embolism (HCC) on their problem list.  03/23/2023 Pt. Presents for follow up after hospitalization. She is here with her husband who is blind, but manages all of her medications very well. Patient herself has some cognitive impairment, and has memory issues as a result. Her husband is an excellent historian. Pt states she has been doing well since her hospitalization.  She is compliant with her Eliquis twice daily.  I have sent in a prescription for the next several months and her husband manages her medications very astutely.I have written refills for her  Eliquis. She knows to call the office for any issues with getting medication so she has zero missed doses. We discussed that these bilateral lower lobe pulmonary emboli are concerning  for provoked VTE  in the setting of undiagnosed cancer. She has been transitioned to DOAC  for a minimum of 6 months. We discussed that the Right middle lobe nodule is concerning for possible malignancy. We discussed the pros and cons of navigational bronchoscopy now versus watchful waiting. She agreed  that it is reasonable to treat the pulmonary embolism before approaching the pulmonary nodule. Dr. Delton Coombes  recommended an  outpatient follow-up in our office , which we are doing today, for the PE and to plan a repeat CT scan of the chest (super D CT chest) and possible associated PET scan in 3 months, April 2025. At that time we can review as an outpatient and decide whether to pursue navigational bronchoscopy.  I have ordered the follow up Super D and PET scans for 06/2023. I have renewed Eliquis Rx. She has follow up with me 06/28/2023, about a week after the PET and Super D CT are completed to re-evaluate for bronchoscopy with biopsy. The patient has been experiencing left-sided chest pain, which has been present since before the hospitalization. The pain is more pronounced at night.     Test Results:  IMPRESSION: CTA of the chest: 03/09/2023 Changes consistent with bilateral pulmonary emboli primarily in the lower lobes bilaterally. Adjacent noted area of density in the right lower lobe is noted likely representing a pulmonary infarct. Right heart strain is noted as well (RV/LV ratio 1.5). The presence of right heart strain has been associated  with an increased risk of morbidity and mortality. Please refer to the "Code PE Focused" order set in EPIC.   2.5 cm somewhat spiculated mass lesion identified within right middle lobe adjacent to the cardiac border. Associated right hilar and mediastinal lymph nodes noted.  These changes are consistent with pulmonary neoplasm till proven otherwise. PET-CT and tissue sampling is recommended for further evaluation.   CT of the abdomen and pelvis: Diverticular change of the colon without evidence of diverticulitis.   Multiple uterine fibroids.     Latest Ref Rng & Units 03/11/2023    2:22 AM 03/10/2023    3:39 AM 03/09/2023    8:05 PM  CBC  WBC 4.0 - 10.5 K/uL 9.9  8.3    Hemoglobin 12.0 - 15.0 g/dL 8.9  9.4  9.5   Hematocrit 36.0 - 46.0 % 27.7  29.0  28.0   Platelets 150 - 400 K/uL 244  217         Latest Ref Rng & Units 03/11/2023    2:22 AM 03/10/2023    3:39 AM 03/09/2023    8:05 PM  BMP  Glucose 70 - 99 mg/dL 99  161    BUN 8 - 23 mg/dL 14  20    Creatinine 0.96 - 1.00 mg/dL 0.45  4.09    Sodium 811 - 145 mmol/L 131  131  128   Potassium 3.5 - 5.1 mmol/L 4.0  4.1  4.2   Chloride 98 - 111 mmol/L 106  101    CO2 22 - 32 mmol/L 18  21    Calcium 8.9 - 10.3 mg/dL 8.1  7.9      BNP No results found for: "BNP"  ProBNP No results found for: "PROBNP"  PFT No results found for: "FEV1PRE", "FEV1POST", "FVCPRE", "FVCPOST", "TLC", "DLCOUNC", "PREFEV1FVCRT", "PSTFEV1FVCRT"  VAS [place] LOWER EXTREMITY VENOUS (DVT) Result Date: 03/11/2023  Lower Venous DVT Study Patient Name:  Tara Martin  Date of Exam:   03/11/2023 Medical Rec #: [id number]         Accession #:    [id number] Date of Birth: 1940/12/31         Patient Gender: F Patient Age:   42 years Exam Location:  [facility] Procedure:      VAS [place] LOWER EXTREMITY VENOUS (DVT) Referring Phys: Rutherford Guys --------------------------------------------------------------------------------  Indications: Pain, pulmonary embolism, and Pain in legs and right flank. Other             Daughter palpated legs and felt lump throughout right inner Indications:      thigh. Risk Factors: Confirmed PE. Comparison Study: No prior exam. Performing Technologist: Fernande Bras  Examination Guidelines: A complete evaluation  includes B-mode imaging, spectral Doppler, color Doppler, and power Doppler as needed of all accessible portions of each vessel. Bilateral testing is considered an integral part of a complete examination. Limited examinations for reoccurring indications may be performed as noted. The reflux portion of the exam is performed with the patient in reverse Trendelenburg.  +---------+---------------+---------+-----------+----------+--------------+ RIGHT    CompressibilityPhasicitySpontaneityPropertiesThrombus Aging +---------+---------------+---------+-----------+----------+--------------+ CFV      Full           Yes      Yes                                 +---------+---------------+---------+-----------+----------+--------------+ SFJ      Full  Yes      Yes                                 +---------+---------------+---------+-----------+----------+--------------+ FV Prox  Full                                                        +---------+---------------+---------+-----------+----------+--------------+ FV Mid   None           Yes      Yes                                 +---------+---------------+---------+-----------+----------+--------------+ FV DistalPartial        Yes      Yes                                 +---------+---------------+---------+-----------+----------+--------------+ POP      Full           Yes      Yes                                 +---------+---------------+---------+-----------+----------+--------------+ PTV      Full                                                        +---------+---------------+---------+-----------+----------+--------------+ PERO     Full                                                        +---------+---------------+---------+-----------+----------+--------------+ Gastroc  Full                                                         +---------+---------------+---------+-----------+----------+--------------+ GSV      None           No       No                                  +---------+---------------+---------+-----------+----------+--------------+ Thrombus in the entire length of right GSV presumed to be about 1.3 cm from CFV.  +---------+---------------+---------+-----------+----------+--------------+ LEFT     CompressibilityPhasicitySpontaneityPropertiesThrombus Aging +---------+---------------+---------+-----------+----------+--------------+ CFV      Partial        Yes      Yes                                 +---------+---------------+---------+-----------+----------+--------------+ SFJ      Partial        Yes  Yes                                 +---------+---------------+---------+-----------+----------+--------------+ FV Prox  Full                                                        +---------+---------------+---------+-----------+----------+--------------+ FV Mid   Full                                                        +---------+---------------+---------+-----------+----------+--------------+ FV DistalFull                                                        +---------+---------------+---------+-----------+----------+--------------+ PFV      Full                                                        +---------+---------------+---------+-----------+----------+--------------+ POP      Full           Yes      Yes                                 +---------+---------------+---------+-----------+----------+--------------+ PTV      Partial        No       No                                  +---------+---------------+---------+-----------+----------+--------------+ PERO     Partial        Yes      Yes                                 +---------+---------------+---------+-----------+----------+--------------+ Gastroc  Full                                                         +---------+---------------+---------+-----------+----------+--------------+ GSV      Full                                                        +---------+---------------+---------+-----------+----------+--------------+     Summary: RIGHT: - Findings consistent with acute deep vein thrombosis involving the right femoral vein. - Findings consistent with acute superficial vein thrombosis involving the right great saphenous vein.  -  No cystic structure found in the popliteal fossa.  LEFT: - Findings consistent with acute deep vein thrombosis involving the left posterior tibial veins, and left peroneal veins.  - Findings consistent with age indeterminate deep vein thrombosis involving the SF junction, and left common femoral vein.  - No cystic structure found in the popliteal fossa.  *See table(s) above for measurements and observations. Electronically signed by Carolynn Sayers on 03/11/2023 at 1:21:05 PM.    Final    ECHOCARDIOGRAM COMPLETE Result Date: 03/10/2023    ECHOCARDIOGRAM REPORT   Patient Name:   XINYI BATTON Borak Date of Exam: 03/10/2023 Medical Rec #:  [id number]        Height:       64.0 in Accession #:    [id number]       Weight:       143.3 lb Date of Birth:  November 23, 1940        BSA:          1.698 m Patient Age:    82 years         BP:           112/57 mmHg Patient Gender: F                HR:           75 bpm. Exam Location:  Inpatient Procedure: 2D Echo, Cardiac Doppler and Color Doppler Indications:    Dyspnea  History:        Patient has no prior history of Echocardiogram examinations.  Sonographer:    Meagan Baucom RDCS, FE, PE Referring Phys: [id number] ABRAHAM FELIZ ORTIZ IMPRESSIONS  1. Left ventricular ejection fraction, by estimation, is 60 to 65%. The left ventricle has normal function. The left ventricle has no regional wall motion abnormalities. Left ventricular diastolic parameters were normal.  2. Right ventricular systolic function is normal. The right  ventricular size is normal. There is normal pulmonary artery systolic pressure. The estimated right ventricular systolic pressure is 28.0 mmHg.  3. The mitral valve is normal in structure. Trivial mitral valve regurgitation. No evidence of mitral stenosis.  4. The aortic valve is normal in structure. Aortic valve regurgitation is not visualized. No aortic stenosis is present.  5. The inferior vena cava is normal in size with greater than 50% respiratory variability, suggesting right atrial pressure of 3 mmHg. FINDINGS  Left Ventricle: Left ventricular ejection fraction, by estimation, is 60 to 65%. The left ventricle has normal function. The left ventricle has no regional wall motion abnormalities. The left ventricular internal cavity size was normal in size. There is  no left ventricular hypertrophy. Left ventricular diastolic parameters were normal. Right Ventricle: The right ventricular size is normal. No increase in right ventricular wall thickness. Right ventricular systolic function is normal. There is normal pulmonary artery systolic pressure. The tricuspid regurgitant velocity is 2.50 m/s, and  with an assumed right atrial pressure of 3 mmHg, the estimated right ventricular systolic pressure is 28.0 mmHg. Left Atrium: Left atrial size was normal in size. Right Atrium: Right atrial size was normal in size. Pericardium: There is no evidence of pericardial effusion. Mitral Valve: The mitral valve is normal in structure. Trivial mitral valve regurgitation. No evidence of mitral valve stenosis. Tricuspid Valve: The tricuspid valve is normal in structure. Tricuspid valve regurgitation is trivial. No evidence of tricuspid stenosis. Aortic Valve: The aortic valve is normal in structure. Aortic valve regurgitation is not visualized. No aortic stenosis is present. Aortic valve mean  gradient measures 4.0 mmHg. Aortic valve peak gradient measures 8.2 mmHg. Aortic valve area, by VTI measures 2.79 cm. Pulmonic Valve: The  pulmonic valve was normal in structure. Pulmonic valve regurgitation is not visualized. No evidence of pulmonic stenosis. Aorta: The aortic root is normal in size and structure. Venous: The inferior vena cava is normal in size with greater than 50% respiratory variability, suggesting right atrial pressure of 3 mmHg. IAS/Shunts: No atrial level shunt detected by color flow Doppler.  LEFT VENTRICLE PLAX 2D LVIDd:         4.10 cm   Diastology LVIDs:         2.50 cm   LV e' medial:    9.79 cm/s LV PW:         0.90 cm   LV E/e' medial:  7.4 LV IVS:        0.90 cm   LV e' lateral:   12.30 cm/s LVOT diam:     2.10 cm   LV E/e' lateral: 5.9 LV SV:         80 LV SV Index:   47 LVOT Area:     3.46 cm  RIGHT VENTRICLE RV S prime:     21.70 cm/s TAPSE (M-mode): 1.3 cm LEFT ATRIUM           Index        RIGHT ATRIUM           Index LA diam:      3.40 cm 2.00 cm/m   RA Area:     11.50 cm LA Vol (A2C): 40.6 ml 23.91 ml/m  RA Volume:   23.20 ml  13.66 ml/m LA Vol (A4C): 43.0 ml 25.33 ml/m  AORTIC VALVE AV Area (Vmax):    2.62 cm AV Area (Vmean):   2.65 cm AV Area (VTI):     2.79 cm AV Vmax:           143.00 cm/s AV Vmean:          95.300 cm/s AV VTI:            0.288 m AV Peak Grad:      8.2 mmHg AV Mean Grad:      4.0 mmHg LVOT Vmax:         108.00 cm/s LVOT Vmean:        72.800 cm/s LVOT VTI:          0.232 m LVOT/AV VTI ratio: 0.81  AORTA Ao Root diam: 3.00 cm MITRAL VALVE               TRICUSPID VALVE MV Area (PHT): 3.17 cm    TR Peak grad:   25.0 mmHg MV Decel Time: 239 msec    TR Vmax:        250.00 cm/s MV E velocity: 72.30 cm/s MV A velocity: 91.70 cm/s  SHUNTS MV E/A ratio:  0.79        Systemic VTI:  0.23 m                            Systemic Diam: 2.10 cm Aditya Sabharwal Electronically signed by Dorthula Nettles Signature Date/Time: 03/10/2023/1:57:44 PM    Final    CT Angio Chest PE W and/or Wo Contrast Result Date: 03/09/2023 CLINICAL DATA:  Chest and abdominal pain with fevers, initial encounter EXAM: CT  ANGIOGRAPHY CHEST CT ABDOMEN AND PELVIS WITH CONTRAST TECHNIQUE: Multidetector CT imaging of the chest  was performed using the standard protocol during bolus administration of intravenous contrast. Multiplanar CT image reconstructions and MIPs were obtained to evaluate the vascular anatomy. Multidetector CT imaging of the abdomen and pelvis was performed using the standard protocol during bolus administration of intravenous contrast. RADIATION DOSE REDUCTION: This exam was performed according to the departmental dose-optimization program which includes automated exposure control, adjustment of the mA and/or kV according to patient size and/or use of iterative reconstruction technique. CONTRAST:  80mL OMNIPAQUE IOHEXOL 350 MG/ML SOLN COMPARISON:  Chest x-ray from earlier in the same day. FINDINGS: CTA CHEST FINDINGS Cardiovascular: Thoracic aorta is well visualize without evidence of aneurysmal dilatation or dissection. Mild atherosclerotic calcifications are seen. Pulmonary artery is well visualized with evidence of bilateral pulmonary emboli left slightly greater than right particularly in the lower lobes. RV/LV ratio is elevated at 1.5 consistent with right heart strain. Mild coronary calcifications are noted. Mediastinum/Nodes: Thoracic inlet is within normal limits. Esophagus as visualized is within normal limits. Scattered mediastinal lymph nodes are noted in the pretracheal and precarinal region as well as a large subcarinal node which measures 10 mm in short axis. Additionally extensive right hilar lymph nodes are noted. Additionally right middle lobe there is a 2.5 cm solid spiculated lesion medially along the right heart border. This is new from a prior CT examination of 2015 and suspicious for neoplasm till proven otherwise. Lungs/Pleura: Emphysematous changes are noted bilaterally. Rounded basilar density is noted in the right lower lobe suspicious for pulmonary infarct given the underlying pulmonary  emboli. Left basilar atelectasis is noted as well. Musculoskeletal: Degenerative changes of the thoracic spine are noted. Review of the MIP images confirms the above findings. CT ABDOMEN and PELVIS FINDINGS Hepatobiliary: No focal liver abnormality is seen. Status post cholecystectomy. No biliary dilatation. Pancreas: Unremarkable. No pancreatic ductal dilatation or surrounding inflammatory changes. Spleen: Normal in size without focal abnormality. Adrenals/Urinary Tract: Adrenal glands are within normal limits. Kidneys demonstrate a normal enhancement pattern bilaterally. No renal calculi or obstructive changes are noted. The bladder is well distended. Stomach/Bowel: Scattered diverticular change of the colon is noted without evidence of diverticulitis. No obstructive changes of the colon are seen. The appendix is not well visualized and may have been surgically removed. No inflammatory changes to suggest appendicitis are noted. Small bowel and stomach appear within normal limits. Vascular/Lymphatic: Aortic atherosclerosis. No enlarged abdominal or pelvic lymph nodes. Reproductive: Uterus is enlarged with multiple calcified and noncalcified uterine fibroids. No adnexal mass is seen. Other: No abdominal wall hernia or abnormality. No abdominopelvic ascites. Musculoskeletal: Degenerative changes of lumbar spine are noted. Review of the MIP images confirms the above findings. IMPRESSION: CTA of the chest: Changes consistent with bilateral pulmonary emboli primarily in the lower lobes bilaterally. Adjacent noted area of density in the right lower lobe is noted likely representing a pulmonary infarct. Right heart strain is noted as well (RV/LV ratio 1.5). The presence of right heart strain has been associated with an increased risk of morbidity and mortality. Please refer to the "Code PE Focused" order set in EPIC. 2.5 cm somewhat spiculated mass lesion identified within right middle lobe adjacent to the cardiac border.  Associated right hilar and mediastinal lymph nodes noted. These changes are consistent with pulmonary neoplasm till proven otherwise. PET-CT and tissue sampling is recommended for further evaluation. CT of the abdomen and pelvis: Diverticular change of the colon without evidence of diverticulitis. Multiple uterine fibroids. Critical Value/emergent results were called by telephone at the time of  interpretation on 03/09/2023 at 7:35 pm to Fayrene Helper PA, who verbally acknowledged these results. Electronically Signed   By: Alcide Clever M.D.   On: 03/09/2023 19:56   CT ABDOMEN PELVIS W CONTRAST Result Date: 03/09/2023 CLINICAL DATA:  Chest and abdominal pain with fevers, initial encounter EXAM: CT ANGIOGRAPHY CHEST CT ABDOMEN AND PELVIS WITH CONTRAST TECHNIQUE: Multidetector CT imaging of the chest was performed using the standard protocol during bolus administration of intravenous contrast. Multiplanar CT image reconstructions and MIPs were obtained to evaluate the vascular anatomy. Multidetector CT imaging of the abdomen and pelvis was performed using the standard protocol during bolus administration of intravenous contrast. RADIATION DOSE REDUCTION: This exam was performed according to the departmental dose-optimization program which includes automated exposure control, adjustment of the mA and/or kV according to patient size and/or use of iterative reconstruction technique. CONTRAST:  80mL OMNIPAQUE IOHEXOL 350 MG/ML SOLN COMPARISON:  Chest x-ray from earlier in the same day. FINDINGS: CTA CHEST FINDINGS Cardiovascular: Thoracic aorta is well visualize without evidence of aneurysmal dilatation or dissection. Mild atherosclerotic calcifications are seen. Pulmonary artery is well visualized with evidence of bilateral pulmonary emboli left slightly greater than right particularly in the lower lobes. RV/LV ratio is elevated at 1.5 consistent with right heart strain. Mild coronary calcifications are noted.  Mediastinum/Nodes: Thoracic inlet is within normal limits. Esophagus as visualized is within normal limits. Scattered mediastinal lymph nodes are noted in the pretracheal and precarinal region as well as a large subcarinal node which measures 10 mm in short axis. Additionally extensive right hilar lymph nodes are noted. Additionally right middle lobe there is a 2.5 cm solid spiculated lesion medially along the right heart border. This is new from a prior CT examination of 2015 and suspicious for neoplasm till proven otherwise. Lungs/Pleura: Emphysematous changes are noted bilaterally. Rounded basilar density is noted in the right lower lobe suspicious for pulmonary infarct given the underlying pulmonary emboli. Left basilar atelectasis is noted as well. Musculoskeletal: Degenerative changes of the thoracic spine are noted. Review of the MIP images confirms the above findings. CT ABDOMEN and PELVIS FINDINGS Hepatobiliary: No focal liver abnormality is seen. Status post cholecystectomy. No biliary dilatation. Pancreas: Unremarkable. No pancreatic ductal dilatation or surrounding inflammatory changes. Spleen: Normal in size without focal abnormality. Adrenals/Urinary Tract: Adrenal glands are within normal limits. Kidneys demonstrate a normal enhancement pattern bilaterally. No renal calculi or obstructive changes are noted. The bladder is well distended. Stomach/Bowel: Scattered diverticular change of the colon is noted without evidence of diverticulitis. No obstructive changes of the colon are seen. The appendix is not well visualized and may have been surgically removed. No inflammatory changes to suggest appendicitis are noted. Small bowel and stomach appear within normal limits. Vascular/Lymphatic: Aortic atherosclerosis. No enlarged abdominal or pelvic lymph nodes. Reproductive: Uterus is enlarged with multiple calcified and noncalcified uterine fibroids. No adnexal mass is seen. Other: No abdominal wall hernia  or abnormality. No abdominopelvic ascites. Musculoskeletal: Degenerative changes of lumbar spine are noted. Review of the MIP images confirms the above findings. IMPRESSION: CTA of the chest: Changes consistent with bilateral pulmonary emboli primarily in the lower lobes bilaterally. Adjacent noted area of density in the right lower lobe is noted likely representing a pulmonary infarct. Right heart strain is noted as well (RV/LV ratio 1.5). The presence of right heart strain has been associated with an increased risk of morbidity and mortality. Please refer to the "Code PE Focused" order set in EPIC. 2.5 cm somewhat spiculated mass  lesion identified within right middle lobe adjacent to the cardiac border. Associated right hilar and mediastinal lymph nodes noted. These changes are consistent with pulmonary neoplasm till proven otherwise. PET-CT and tissue sampling is recommended for further evaluation. CT of the abdomen and pelvis: Diverticular change of the colon without evidence of diverticulitis. Multiple uterine fibroids. Critical Value/emergent results were called by telephone at the time of interpretation on 03/09/2023 at 7:35 pm to Fayrene Helper PA, who verbally acknowledged these results. Electronically Signed   By: Alcide Clever M.D.   On: 03/09/2023 19:56   DG Chest Port 1 View Result Date: 03/09/2023 CLINICAL DATA:  10026 Shortness of breath 10026 EXAM: PORTABLE CHEST 1 VIEW COMPARISON:  02/11/2014 chest radiograph. FINDINGS: Low lung volumes. Stable cardiomediastinal silhouette with top-normal heart size. No pneumothorax. Small left pleural effusion. No right pleural effusion. No pulmonary edema. No consolidative airspace disease. IMPRESSION: Low lung volumes. Small left pleural effusion. Electronically Signed   By: Delbert Phenix M.D.   On: 03/09/2023 17:34     Past medical hx Past Medical History:  Diagnosis Date   Anxiety state, unspecified - sees Dr. Jennette Kettle in gyn and benzo prescribed there 04/09/2013    Arthritis    Chicken pox    Lupus    MCI (mild cognitive impairment) 05/20/2020   Migraines    Prediabetes      Social History   Tobacco Use   Smoking status: Never   Smokeless tobacco: Never  Vaping Use   Vaping status: Never Used  Substance Use Topics   Alcohol use: Not Currently    Alcohol/week: 2.0 standard drinks of alcohol    Types: 2 Glasses of wine per week   Drug use: No    Ms.Elrod reports that she has never smoked. She has never used smokeless tobacco. She reports that she does not currently use alcohol after a past usage of about 2.0 standard drinks of alcohol per week. She reports that she does not use drugs.  Tobacco Cessation: Never smoker   Past surgical hx, Family hx, Social hx all reviewed.  Current Outpatient Medications on File Prior to Visit  Medication Sig   APIXABAN (ELIQUIS) VTE STARTER PACK (10MG  AND 5MG ) Take as directed on package: start with two-5mg  tablets twice daily for 7 days. On day 8, switch to one-5mg  tablet twice daily.   diclofenac sodium (VOLTAREN) 1 % GEL Apply 3 gm to 3 large joints up to 3 times a day.Dispense 3 tubes with 3 refills.   donepezil (ARICEPT) 10 MG tablet Take 1 tablet (10 mg total) by mouth at bedtime.   Multiple Vitamins-Minerals (MULTIVITAMIN WOMEN 50+) TABS Take 1 tablet by mouth daily.   ALPRAZolam (XANAX) 0.5 MG tablet Take 0.5 mg by mouth daily as needed for anxiety. (Patient not taking: Reported on 03/23/2023)   No current facility-administered medications on file prior to visit.     Allergies  Allergen Reactions   Codeine Other (See Comments)    Per patient made her "flip out"    Review Of Systems:  Constitutional:   No  weight loss, night sweats,  Fevers, chills, fatigue, or  lassitude.  HEENT:   No headaches,  Difficulty swallowing,  Tooth/dental problems, or  Sore throat,                No sneezing, itching, ear ache, nasal congestion, post nasal drip,   CV:  No chest pain,  Orthopnea, PND, swelling  in lower extremities, anasarca, dizziness, palpitations, syncope.  GI  No heartburn, indigestion, abdominal pain, nausea, vomiting, diarrhea, change in bowel habits, loss of appetite, bloody stools.   Resp: No shortness of breath with exertion or at rest.  No excess mucus, no productive cough,  No non-productive cough,  No coughing up of blood.  No change in color of mucus.  No wheezing.  No chest wall deformity  Skin: no rash or lesions.  GU: no dysuria, change in color of urine, no urgency or frequency.  No flank pain, no hematuria   MS:  No joint pain or swelling.  No decreased range of motion.  No back pain.  Psych:  No change in mood or affect. No depression or anxiety.  No memory loss.   Vital Signs BP 138/60 (BP Location: Right Arm, Cuff Size: Normal)   Pulse 71   Temp 98 F (36.7 C) (Temporal)   Ht 5' 4.5" (1.638 m)   Wt 134 lb 12.8 oz (61.1 kg)   SpO2 99%   BMI 22.78 kg/m    Physical Exam:  General- No distress,  A&Ox3 ENT: No sinus tenderness, TM clear, pale nasal mucosa, no oral exudate,no post nasal drip, no LAN Cardiac: S1, S2, regular rate and rhythm, no murmur Chest: No wheeze/ rales/ dullness; no accessory muscle use, no nasal flaring, no sternal retractions Abd.: Soft Non-tender Ext: No clubbing cyanosis, edema Neuro:  normal strength Skin: No rashes, warm and dry Psych: normal mood and behavior   Assessment/Plan Pulmonary Nodule New 17mm nodule identified on recent CT scan. Discussed the need for further evaluation with PET scan to assess for possible malignancy, given the hypercoagulable state associated with malignancies. -Order PET scan and follow-up CT chest for April 2025. - Follow up with Maralyn Sago NP 06/28/2023 as is scheduled to discuss follow up imaging results - Plan for possible biopsy in 3 months depending on PET scan results.  Pulmonary Embolism Recent hospitalization for pulmonary embolism, currently on Eliquis 5mg  twice daily. Discussed the  importance of consistent dosing and potential need for bridging with Lovenox for any future procedures. -Continue Eliquis 5mg  twice daily. -Consider Lovenox bridge for future procedures if necessary. - Eliquis refills sent - Call or seek emergency care  for any bleeding that will not stop after holding pressure.    Lupus Chronic condition, no acute issues discussed in this visit. -No changes to current management plan.  General Health Maintenance / Followup Plans -Order for annual lung cancer screening given smoking history. -Plan for follow-up echocardiogram in 6 months to assess right heart function post pulmonary embolism. -Continue iron supplementation as tolerated for anemia. -Follow-up appointment after PET scan to discuss results and next steps.   I spent 40 minutes dedicated to the care of this patient on the date of this encounter to include pre-visit review of records, face-to-face time with the patient discussing conditions above, post visit ordering of testing, clinical documentation with the electronic health record, making appropriate referrals as documented, and communicating necessary information to the patient's healthcare team.   Bevelyn Ngo, NP 03/23/2023  11:28 AM

## 2023-04-01 ENCOUNTER — Encounter: Payer: Self-pay | Admitting: Acute Care

## 2023-04-18 ENCOUNTER — Ambulatory Visit (INDEPENDENT_AMBULATORY_CARE_PROVIDER_SITE_OTHER): Payer: Medicare HMO | Admitting: Family Medicine

## 2023-04-18 ENCOUNTER — Encounter: Payer: Self-pay | Admitting: Family Medicine

## 2023-04-18 VITALS — BP 130/70 | HR 60 | Temp 97.6°F | Wt 136.8 lb

## 2023-04-18 DIAGNOSIS — R918 Other nonspecific abnormal finding of lung field: Secondary | ICD-10-CM

## 2023-04-18 DIAGNOSIS — I2699 Other pulmonary embolism without acute cor pulmonale: Secondary | ICD-10-CM

## 2023-04-18 DIAGNOSIS — R4189 Other symptoms and signs involving cognitive functions and awareness: Secondary | ICD-10-CM | POA: Diagnosis not present

## 2023-04-18 DIAGNOSIS — E871 Hypo-osmolality and hyponatremia: Secondary | ICD-10-CM

## 2023-04-18 NOTE — Progress Notes (Signed)
 Established Patient Office Visit  Subjective   Patient ID: Tara Martin, female    DOB: Jan 12, 1941  Age: 83 y.o. MRN: [id number]  Chief Complaint  Patient presents with   Medical Management of Chronic Issues         HPI   Tara Martin is seen companied by husband for 1 month follow-up.  Refer to last note for details.  She had increased episode of shortness of breath January 1 which prompted ER visit.  Workup there revealed bilateral pulmonary emboli and 2 and half centimeter spiculated mass right middle lobe with some associated hilar and mediastinal adenopathy.  Venous Dopplers lower extremities revealed bilateral acute deep vein thromboses.  She is now on Eliquis .  Followed by pulmonary.  Has pending PET scan and high-resolution CT scan.  Rare cough.  No fever.  No hemoptysis.  Weight is down another few pounds from last visit.  She has had some mild hyponatremia.  Last sodium 131.  No thiazide use.  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  Husband states she eats regularly but not a lot at 1 time.  She does have advancing dementia.  She has been followed through neurology and has follow-up appointment within the spring.  She takes Aricept .  Husband has noted definite deterioration in her short-term memory.  She is starting to place objects in the wrong place  Past Medical History:  Diagnosis Date   Anxiety state, unspecified - sees Dr. Andree Kayser in gyn and benzo prescribed there 04/09/2013   Arthritis    Chicken pox    Lupus    MCI (mild cognitive impairment) 05/20/2020   Migraines    Prediabetes    Past Surgical History:  Procedure Laterality Date   BACK SURGERY  2000   CHOLECYSTECTOMY     DILATION AND CURETTAGE OF UTERUS  1973   GALLBLADDER SURGERY  1974    reports that she has never smoked. She has never used smokeless tobacco. She reports that she does not currently use alcohol after a past usage of about 2.0 standard drinks of alcohol per week. She reports that she does not  use drugs. family history includes Alcoholism in an other family member; Alzheimer's disease in her brother and sister; Arthritis in her mother and another family member; COPD in her sister; Diabetes in her mother; Heart attack in her mother; Heart disease in her brother and mother; Hypertension in an other family member; Migraines in her daughter; Pneumonia in her father; Pulmonary embolism in her brother; Sudden death in an other family member. Allergies  Allergen Reactions   Codeine Other (See Comments)    Per patient made her "flip out"    Review of Systems  Constitutional:  Negative for malaise/fatigue.  Respiratory:  Positive for cough and shortness of breath. Negative for hemoptysis.   Cardiovascular:  Negative for chest pain.  Neurological:  Negative for dizziness, weakness and headaches.      Objective:     BP 130/70 (BP Location: Left Arm, Patient Position: Sitting, Cuff Size: Normal)   Pulse 60   Temp 97.6 F (36.4 C) (Oral)   Wt 136 lb 12.8 oz (62.1 kg)   SpO2 95%   BMI 23.12 kg/m  BP Readings from Last 3 Encounters:  04/18/23 130/70  03/23/23 138/60  03/18/23 124/70   Wt Readings from Last 3 Encounters:  04/18/23 136 lb 12.8 oz (62.1 kg)  03/23/23 134 lb 12.8 oz (61.1 kg)  03/18/23 141 lb 9.6 oz (64.2 kg)  Physical Exam Vitals reviewed.  Constitutional:      General: She is not in acute distress. Cardiovascular:     Rate and Rhythm: Normal rate and regular rhythm.  Pulmonary:     Effort: Pulmonary effort is normal. No respiratory distress.     Breath sounds: Normal breath sounds.  Musculoskeletal:     Right lower leg: No edema.     Left lower leg: No edema.  Neurological:     Mental Status: She is alert.      No results found for any visits on 04/18/23.  Last CBC Lab Results  Component Value Date   WBC 9.9 03/11/2023   HGB 8.9 (L) 03/11/2023   HCT 27.7 (L) 03/11/2023   MCV 90.5 03/11/2023   MCH 29.1 03/11/2023   RDW 12.6 03/11/2023    PLT 244 03/11/2023   Last metabolic panel Lab Results  Component Value Date   GLUCOSE 99 03/11/2023   NA 131 (L) 03/11/2023   K 4.0 03/11/2023   CL 106 03/11/2023   CO2 18 (L) 03/11/2023   BUN 14 03/11/2023   CREATININE 0.94 03/11/2023   GFRNONAA >60 03/11/2023   CALCIUM  8.1 (L) 03/11/2023   PROT 6.7 03/09/2023   ALBUMIN 3.6 03/09/2023   BILITOT 0.4 03/09/2023   ALKPHOS 78 03/09/2023   AST 45 (H) 03/09/2023   ALT 52 (H) 03/09/2023   ANIONGAP 7 03/11/2023   Last thyroid  functions Lab Results  Component Value Date   TSH 4.16 12/11/2019      The ASCVD Risk score (Arnett DK, et al., 2019) failed to calculate for the following reasons:   The 2019 ASCVD risk score is only valid for ages 44 to 73    Assessment & Plan:   #1 hyponatremia.  Repeat basic metabolic panel.  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  No thiazide use.  Recent poor dietary intake may also be contributing.  #2 right lung mass noted on recent CT imaging of the chest.  Followed by pulmonary.  Worrisome for primary lung cancer.  #3 advancing dementia.  Short-term memory is worsening.  She is on Aricept  already.  Continue close follow-up with neurology.  #4 pulmonary emboli/bilateral DVTs.  Stressed importance of compliance with Eliquis .  Husband is overseeing her medications to ensure compliance.   No follow-ups on file.    Glean Lamy, MD

## 2023-04-19 DIAGNOSIS — L718 Other rosacea: Secondary | ICD-10-CM | POA: Diagnosis not present

## 2023-04-19 LAB — BASIC METABOLIC PANEL
BUN: 14 mg/dL (ref 6–23)
CO2: 25 meq/L (ref 19–32)
Calcium: 9 mg/dL (ref 8.4–10.5)
Chloride: 105 meq/L (ref 96–112)
Creatinine, Ser: 0.77 mg/dL (ref 0.40–1.20)
GFR: 72.35 mL/min (ref >60.00)
Glucose, Bld: 91 mg/dL (ref 70–99)
Potassium: 4 meq/L (ref 3.5–5.1)
Sodium: 139 meq/L (ref 135–145)

## 2023-05-10 ENCOUNTER — Other Ambulatory Visit: Payer: Self-pay | Admitting: Family Medicine

## 2023-05-10 DIAGNOSIS — I2699 Other pulmonary embolism without acute cor pulmonale: Secondary | ICD-10-CM

## 2023-05-10 MED ORDER — APIXABAN 5 MG PO TABS: 5.0000 mg | ORAL_TABLET | Freq: Two times a day (BID) | ORAL | 5 refills | Status: DC

## 2023-05-25 ENCOUNTER — Encounter: Payer: Self-pay | Admitting: Neurology

## 2023-05-25 ENCOUNTER — Ambulatory Visit: Payer: Medicare HMO | Admitting: Neurology

## 2023-05-25 VITALS — BP 146/78 | HR 77 | Ht 64.0 in | Wt 135.0 lb

## 2023-05-25 DIAGNOSIS — G301 Alzheimer's disease with late onset: Secondary | ICD-10-CM | POA: Insufficient documentation

## 2023-05-25 DIAGNOSIS — I2694 Multiple subsegmental pulmonary emboli without acute cor pulmonale: Secondary | ICD-10-CM | POA: Diagnosis not present

## 2023-05-25 DIAGNOSIS — F02B0 Dementia in other diseases classified elsewhere, moderate, without behavioral disturbance, psychotic disturbance, mood disturbance, and anxiety: Secondary | ICD-10-CM | POA: Diagnosis not present

## 2023-05-25 DIAGNOSIS — F05 Delirium due to known physiological condition: Secondary | ICD-10-CM | POA: Diagnosis not present

## 2023-05-25 MED ORDER — DONEPEZIL HCL 10 MG PO TABS: 10.0000 mg | ORAL_TABLET | Freq: Every day | ORAL | 1 refills | Status: DC

## 2023-05-25 NOTE — Addendum Note (Signed)
 Addended by: Melvyn Novas on: 05/25/2023 02:55 PM   Modules accepted: Orders
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
## 2023-05-25 NOTE — Patient Instructions (Signed)
 SSESSMENT AND PLAN :  83 y.o. year old female  here with: DEMENTIA , not yet differentiated .     1)  advanced from mild to Moderate degree of dementia, unable to perform a MOCA, MMSE is 18 points.  Likely AD type .   2)  d/c Aricept after hospitalization, I will re- instate this medication.  Delirium ost PE   Additional brain injury due to hypoxia _ she had a PE, was hypoxic and given Fentanyl. Delirium followed.  Remains on Blood thinner.   3) ATN, Alzheimers risk testing to give a differential  dx , also for daughter.  I plan to follow up  through our NP within 6 months. MMSE.

## 2023-05-26 ENCOUNTER — Telehealth: Payer: Self-pay | Admitting: Neurology

## 2023-05-26 NOTE — Telephone Encounter (Signed)
 Pt's husband called stating that the pt was to have Aricept called in for her last night but it still is not at the pharmacy. Please advise.

## 2023-05-26 NOTE — Telephone Encounter (Signed)
 This was sent into the pharmacy yesterday for 10 mg to take at bedtime.  Called the pharmacy and confirmed they did receive the script from yesterday. They stated that insurance would not allow them to fill it yet because they received a 90 day supply in [place] and it would not be due to fill until Saturday. Called the husband back. There was no answer. Left a detailed message advising that I called and confirmed with pharmacy at [facility] they did have the script and they will fill on Saturday when the insurance will allow them to.

## 2023-05-27 ENCOUNTER — Telehealth: Payer: Self-pay | Admitting: Neurology

## 2023-05-27 NOTE — Telephone Encounter (Signed)
 sent to GI they obtain [facility] Berk[facility] Harvey [phone]

## 2023-06-01 LAB — ATN PROFILE
A -- Beta-amyloid 42/40 Ratio: 0.097 — ABNORMAL LOW (ref >0.102)
Beta-amyloid 40: 250.48 pg/mL
Beta-amyloid 42: 24.31 pg/mL
N -- NfL, Plasma: 185 pg/mL — ABNORMAL HIGH (ref 0.00–11.55)
T -- p-tau181: 2.01 pg/mL — ABNORMAL HIGH (ref 0.00–0.97)

## 2023-06-01 LAB — APOE ALZHEIMER'S RISK

## 2023-06-02 ENCOUNTER — Telehealth: Payer: Self-pay | Admitting: Neurology

## 2023-06-02 NOTE — Telephone Encounter (Signed)
-----   Message from [place] Dohmeier sent at 05/31/2023  1:03 PM EDT ----- ATN panel positive , including neuro light filament.  Biomarkers positive for Alzheimer's disease.   Genetic risk testing result is still pending.

## 2023-06-02 NOTE — Telephone Encounter (Signed)
 Called the patient to review lab work . There was no answer. LVM asking for the pt to call back.

## 2023-06-08 ENCOUNTER — Emergency Department (HOSPITAL_COMMUNITY)

## 2023-06-08 ENCOUNTER — Other Ambulatory Visit: Payer: Self-pay

## 2023-06-08 ENCOUNTER — Inpatient Hospital Stay (HOSPITAL_COMMUNITY)
Admission: EM | Admit: 2023-06-08 | Discharge: 2023-06-14 | DRG: 040 | Disposition: A | Discharge status: Rehabilitation Facility | Attending: Internal Medicine | Admitting: Internal Medicine

## 2023-06-08 DIAGNOSIS — R531 Weakness: Secondary | ICD-10-CM | POA: Diagnosis not present

## 2023-06-08 DIAGNOSIS — R590 Localized enlarged lymph nodes: Secondary | ICD-10-CM | POA: Diagnosis present

## 2023-06-08 DIAGNOSIS — I2699 Other pulmonary embolism without acute cor pulmonale: Secondary | ICD-10-CM | POA: Diagnosis not present

## 2023-06-08 DIAGNOSIS — Z515 Encounter for palliative care: Secondary | ICD-10-CM | POA: Diagnosis not present

## 2023-06-08 DIAGNOSIS — D709 Neutropenia, unspecified: Secondary | ICD-10-CM | POA: Diagnosis not present

## 2023-06-08 DIAGNOSIS — C342 Malignant neoplasm of middle lobe, bronchus or lung: Secondary | ICD-10-CM | POA: Diagnosis present

## 2023-06-08 DIAGNOSIS — N179 Acute kidney failure, unspecified: Secondary | ICD-10-CM | POA: Diagnosis not present

## 2023-06-08 DIAGNOSIS — R9089 Other abnormal findings on diagnostic imaging of central nervous system: Secondary | ICD-10-CM | POA: Diagnosis not present

## 2023-06-08 DIAGNOSIS — H539 Unspecified visual disturbance: Secondary | ICD-10-CM | POA: Diagnosis not present

## 2023-06-08 DIAGNOSIS — R911 Solitary pulmonary nodule: Secondary | ICD-10-CM | POA: Diagnosis not present

## 2023-06-08 DIAGNOSIS — I69398 Other sequelae of cerebral infarction: Secondary | ICD-10-CM | POA: Diagnosis not present

## 2023-06-08 DIAGNOSIS — F02B0 Dementia in other diseases classified elsewhere, moderate, without behavioral disturbance, psychotic disturbance, mood disturbance, and anxiety: Secondary | ICD-10-CM | POA: Diagnosis not present

## 2023-06-08 DIAGNOSIS — D6869 Other thrombophilia: Secondary | ICD-10-CM | POA: Diagnosis not present

## 2023-06-08 DIAGNOSIS — F01B0 Vascular dementia, moderate, without behavioral disturbance, psychotic disturbance, mood disturbance, and anxiety: Secondary | ICD-10-CM | POA: Diagnosis present

## 2023-06-08 DIAGNOSIS — D649 Anemia, unspecified: Secondary | ICD-10-CM | POA: Diagnosis present

## 2023-06-08 DIAGNOSIS — Z9049 Acquired absence of other specified parts of digestive tract: Secondary | ICD-10-CM

## 2023-06-08 DIAGNOSIS — R29818 Other symptoms and signs involving the nervous system: Secondary | ICD-10-CM | POA: Diagnosis not present

## 2023-06-08 DIAGNOSIS — C779 Secondary and unspecified malignant neoplasm of lymph node, unspecified: Secondary | ICD-10-CM | POA: Diagnosis not present

## 2023-06-08 DIAGNOSIS — I3139 Other pericardial effusion (noninflammatory): Secondary | ICD-10-CM | POA: Diagnosis not present

## 2023-06-08 DIAGNOSIS — R4182 Altered mental status, unspecified: Secondary | ICD-10-CM | POA: Diagnosis not present

## 2023-06-08 DIAGNOSIS — Z821 Family history of blindness and visual loss: Secondary | ICD-10-CM

## 2023-06-08 DIAGNOSIS — I69320 Aphasia following cerebral infarction: Secondary | ICD-10-CM | POA: Diagnosis not present

## 2023-06-08 DIAGNOSIS — Z86718 Personal history of other venous thrombosis and embolism: Secondary | ICD-10-CM | POA: Diagnosis not present

## 2023-06-08 DIAGNOSIS — Z833 Family history of diabetes mellitus: Secondary | ICD-10-CM | POA: Diagnosis not present

## 2023-06-08 DIAGNOSIS — G8191 Hemiplegia, unspecified affecting right dominant side: Secondary | ICD-10-CM | POA: Diagnosis not present

## 2023-06-08 DIAGNOSIS — M329 Systemic lupus erythematosus, unspecified: Secondary | ICD-10-CM | POA: Diagnosis not present

## 2023-06-08 DIAGNOSIS — H53461 Homonymous bilateral field defects, right side: Secondary | ICD-10-CM | POA: Diagnosis not present

## 2023-06-08 DIAGNOSIS — I679 Cerebrovascular disease, unspecified: Secondary | ICD-10-CM | POA: Diagnosis not present

## 2023-06-08 DIAGNOSIS — H548 Legal blindness, as defined in USA: Secondary | ICD-10-CM | POA: Diagnosis not present

## 2023-06-08 DIAGNOSIS — R0689 Other abnormalities of breathing: Secondary | ICD-10-CM | POA: Diagnosis not present

## 2023-06-08 DIAGNOSIS — R918 Other nonspecific abnormal finding of lung field: Secondary | ICD-10-CM | POA: Diagnosis present

## 2023-06-08 DIAGNOSIS — L814 Other melanin hyperpigmentation: Secondary | ICD-10-CM | POA: Insufficient documentation

## 2023-06-08 DIAGNOSIS — G936 Cerebral edema: Secondary | ICD-10-CM | POA: Diagnosis not present

## 2023-06-08 DIAGNOSIS — I700 Atherosclerosis of aorta: Secondary | ICD-10-CM | POA: Diagnosis not present

## 2023-06-08 DIAGNOSIS — Z66 Do not resuscitate: Secondary | ICD-10-CM | POA: Diagnosis not present

## 2023-06-08 DIAGNOSIS — R4701 Aphasia: Secondary | ICD-10-CM | POA: Diagnosis not present

## 2023-06-08 DIAGNOSIS — I6389 Other cerebral infarction: Secondary | ICD-10-CM | POA: Diagnosis not present

## 2023-06-08 DIAGNOSIS — F419 Anxiety disorder, unspecified: Secondary | ICD-10-CM | POA: Diagnosis not present

## 2023-06-08 DIAGNOSIS — I610 Nontraumatic intracerebral hemorrhage in hemisphere, subcortical: Secondary | ICD-10-CM | POA: Diagnosis not present

## 2023-06-08 DIAGNOSIS — M4313 Spondylolisthesis, cervicothoracic region: Secondary | ICD-10-CM | POA: Diagnosis not present

## 2023-06-08 DIAGNOSIS — G301 Alzheimer's disease with late onset: Secondary | ICD-10-CM | POA: Diagnosis present

## 2023-06-08 DIAGNOSIS — E785 Hyperlipidemia, unspecified: Secondary | ICD-10-CM | POA: Diagnosis not present

## 2023-06-08 DIAGNOSIS — M47812 Spondylosis without myelopathy or radiculopathy, cervical region: Secondary | ICD-10-CM | POA: Diagnosis not present

## 2023-06-08 DIAGNOSIS — D630 Anemia in neoplastic disease: Secondary | ICD-10-CM | POA: Diagnosis not present

## 2023-06-08 DIAGNOSIS — Z79899 Other long term (current) drug therapy: Secondary | ICD-10-CM

## 2023-06-08 DIAGNOSIS — I6340 Cerebral infarction due to embolism of unspecified cerebral artery: Secondary | ICD-10-CM | POA: Diagnosis not present

## 2023-06-08 DIAGNOSIS — F0280 Dementia in other diseases classified elsewhere without behavioral disturbance: Secondary | ICD-10-CM | POA: Diagnosis not present

## 2023-06-08 DIAGNOSIS — Z872 Personal history of diseases of the skin and subcutaneous tissue: Secondary | ICD-10-CM | POA: Insufficient documentation

## 2023-06-08 DIAGNOSIS — R29710 NIHSS score 10: Secondary | ICD-10-CM | POA: Diagnosis not present

## 2023-06-08 DIAGNOSIS — I639 Cerebral infarction, unspecified: Secondary | ICD-10-CM

## 2023-06-08 DIAGNOSIS — Z8261 Family history of arthritis: Secondary | ICD-10-CM

## 2023-06-08 DIAGNOSIS — F411 Generalized anxiety disorder: Secondary | ICD-10-CM | POA: Diagnosis not present

## 2023-06-08 DIAGNOSIS — I6349 Cerebral infarction due to embolism of other cerebral artery: Secondary | ICD-10-CM | POA: Diagnosis not present

## 2023-06-08 DIAGNOSIS — Z885 Allergy status to narcotic agent status: Secondary | ICD-10-CM

## 2023-06-08 DIAGNOSIS — R7303 Prediabetes: Secondary | ICD-10-CM | POA: Diagnosis not present

## 2023-06-08 DIAGNOSIS — I10 Essential (primary) hypertension: Secondary | ICD-10-CM | POA: Diagnosis not present

## 2023-06-08 DIAGNOSIS — M4802 Spinal stenosis, cervical region: Secondary | ICD-10-CM | POA: Diagnosis not present

## 2023-06-08 DIAGNOSIS — M47819 Spondylosis without myelopathy or radiculopathy, site unspecified: Secondary | ICD-10-CM | POA: Diagnosis not present

## 2023-06-08 DIAGNOSIS — Z7189 Other specified counseling: Secondary | ICD-10-CM | POA: Diagnosis not present

## 2023-06-08 DIAGNOSIS — S199XXA Unspecified injury of neck, initial encounter: Secondary | ICD-10-CM | POA: Diagnosis not present

## 2023-06-08 DIAGNOSIS — R278 Other lack of coordination: Secondary | ICD-10-CM | POA: Diagnosis not present

## 2023-06-08 DIAGNOSIS — I6782 Cerebral ischemia: Secondary | ICD-10-CM | POA: Diagnosis not present

## 2023-06-08 DIAGNOSIS — Z825 Family history of asthma and other chronic lower respiratory diseases: Secondary | ICD-10-CM

## 2023-06-08 DIAGNOSIS — C3491 Malignant neoplasm of unspecified part of right bronchus or lung: Secondary | ICD-10-CM | POA: Diagnosis not present

## 2023-06-08 DIAGNOSIS — Z7901 Long term (current) use of anticoagulants: Secondary | ICD-10-CM | POA: Diagnosis not present

## 2023-06-08 DIAGNOSIS — C771 Secondary and unspecified malignant neoplasm of intrathoracic lymph nodes: Secondary | ICD-10-CM | POA: Diagnosis not present

## 2023-06-08 DIAGNOSIS — R414 Neurologic neglect syndrome: Secondary | ICD-10-CM | POA: Diagnosis present

## 2023-06-08 DIAGNOSIS — G309 Alzheimer's disease, unspecified: Secondary | ICD-10-CM | POA: Diagnosis not present

## 2023-06-08 DIAGNOSIS — I6622 Occlusion and stenosis of left posterior cerebral artery: Secondary | ICD-10-CM | POA: Diagnosis not present

## 2023-06-08 DIAGNOSIS — K5901 Slow transit constipation: Secondary | ICD-10-CM | POA: Diagnosis not present

## 2023-06-08 DIAGNOSIS — Z8249 Family history of ischemic heart disease and other diseases of the circulatory system: Secondary | ICD-10-CM | POA: Diagnosis not present

## 2023-06-08 DIAGNOSIS — M1990 Unspecified osteoarthritis, unspecified site: Secondary | ICD-10-CM | POA: Diagnosis not present

## 2023-06-08 DIAGNOSIS — Z820 Family history of epilepsy and other diseases of the nervous system: Secondary | ICD-10-CM

## 2023-06-08 DIAGNOSIS — C44622 Squamous cell carcinoma of skin of right upper limb, including shoulder: Secondary | ICD-10-CM | POA: Insufficient documentation

## 2023-06-08 DIAGNOSIS — Z86711 Personal history of pulmonary embolism: Secondary | ICD-10-CM

## 2023-06-08 DIAGNOSIS — I63439 Cerebral infarction due to embolism of unspecified posterior cerebral artery: Secondary | ICD-10-CM | POA: Diagnosis not present

## 2023-06-08 DIAGNOSIS — F02B3 Dementia in other diseases classified elsewhere, moderate, with mood disturbance: Secondary | ICD-10-CM | POA: Diagnosis not present

## 2023-06-08 DIAGNOSIS — D1801 Hemangioma of skin and subcutaneous tissue: Secondary | ICD-10-CM | POA: Insufficient documentation

## 2023-06-08 DIAGNOSIS — I69351 Hemiplegia and hemiparesis following cerebral infarction affecting right dominant side: Secondary | ICD-10-CM | POA: Diagnosis not present

## 2023-06-08 LAB — HEMOGLOBIN A1C
Hgb A1c MFr Bld: 5.5 % (ref 4.8–5.6)
Mean Plasma Glucose: 111.15 mg/dL

## 2023-06-08 LAB — URINALYSIS, ROUTINE W REFLEX MICROSCOPIC
Bacteria, UA: NONE SEEN
Bilirubin Urine: NEGATIVE
Glucose, UA: NEGATIVE mg/dL
Hgb urine dipstick: NEGATIVE
Ketones, ur: NEGATIVE mg/dL
Nitrite: POSITIVE — AB
Protein, ur: 30 mg/dL — AB
Specific Gravity, Urine: 1.017 (ref 1.005–1.030)
pH: 6 (ref 5.0–8.0)

## 2023-06-08 LAB — DIFFERENTIAL
Abs Immature Granulocytes: 0.04 10*3/uL (ref 0.00–0.07)
Basophils Absolute: 0.1 10*3/uL (ref 0.0–0.1)
Basophils Relative: 1 %
Eosinophils Absolute: 0.2 10*3/uL (ref 0.0–0.5)
Eosinophils Relative: 3 %
Immature Granulocytes: 1 %
Lymphocytes Relative: 17 %
Lymphs Abs: 1 10*3/uL (ref 0.7–4.0)
Monocytes Absolute: 0.6 10*3/uL (ref 0.1–1.0)
Monocytes Relative: 10 %
Neutro Abs: 4 10*3/uL (ref 1.7–7.7)
Neutrophils Relative %: 68 %

## 2023-06-08 LAB — MAGNESIUM: Magnesium: 2.1 mg/dL (ref 1.7–2.4)

## 2023-06-08 LAB — COMPREHENSIVE METABOLIC PANEL WITH GFR
ALT: 11 U/L (ref 0–44)
AST: 22 U/L (ref 15–41)
Albumin: 3.7 g/dL (ref 3.5–5.0)
Alkaline Phosphatase: 75 U/L (ref 38–126)
Anion gap: 11 (ref 5–15)
BUN: 16 mg/dL (ref 8–23)
CO2: 20 mmol/L — ABNORMAL LOW (ref 22–32)
Calcium: 9.2 mg/dL (ref 8.9–10.3)
Chloride: 104 mmol/L (ref 98–111)
Creatinine, Ser: 0.84 mg/dL (ref 0.44–1.00)
GFR, Estimated: >60 mL/min (ref >60)
Glucose, Bld: 105 mg/dL — ABNORMAL HIGH (ref 70–99)
Potassium: 3.3 mmol/L — ABNORMAL LOW (ref 3.5–5.1)
Sodium: 135 mmol/L (ref 135–145)
Total Bilirubin: 0.6 mg/dL (ref 0.0–1.2)
Total Protein: 7.8 g/dL (ref 6.5–8.1)

## 2023-06-08 LAB — CBC
HCT: 26.8 % — ABNORMAL LOW (ref 36.0–46.0)
Hemoglobin: 8 g/dL — ABNORMAL LOW (ref 12.0–15.0)
MCH: 24.3 pg — ABNORMAL LOW (ref 26.0–34.0)
MCHC: 29.9 g/dL — ABNORMAL LOW (ref 30.0–36.0)
MCV: 81.5 fL (ref 80.0–100.0)
Platelets: 236 10*3/uL (ref 150–400)
RBC: 3.29 MIL/uL — ABNORMAL LOW (ref 3.87–5.11)
RDW: 16 % — ABNORMAL HIGH (ref 11.5–15.5)
WBC: 5.8 10*3/uL (ref 4.0–10.5)
nRBC: 0 % (ref 0.0–0.2)

## 2023-06-08 LAB — PHOSPHORUS: Phosphorus: 3.7 mg/dL (ref 2.5–4.6)

## 2023-06-08 LAB — I-STAT CHEM 8, ED
BUN: 13 mg/dL (ref 8–23)
Calcium, Ion: 1.15 mmol/L (ref 1.15–1.40)
Chloride: 107 mmol/L (ref 98–111)
Creatinine, Ser: 0.8 mg/dL (ref 0.44–1.00)
Glucose, Bld: 99 mg/dL (ref 70–99)
HCT: 23 % — ABNORMAL LOW (ref 36.0–46.0)
Hemoglobin: 7.8 g/dL — ABNORMAL LOW (ref 12.0–15.0)
Potassium: 3.5 mmol/L (ref 3.5–5.1)
Sodium: 138 mmol/L (ref 135–145)
TCO2: 20 mmol/L — ABNORMAL LOW (ref 22–32)

## 2023-06-08 LAB — APTT: aPTT: 30 s (ref 24–36)

## 2023-06-08 LAB — CBG MONITORING, ED: Glucose-Capillary: 90 mg/dL (ref 70–99)

## 2023-06-08 LAB — ETHANOL: Alcohol, Ethyl (B): <10 mg/dL (ref <10)

## 2023-06-08 LAB — RAPID URINE DRUG SCREEN, HOSP PERFORMED
Amphetamines: NOT DETECTED
Barbiturates: NOT DETECTED
Benzodiazepines: NOT DETECTED
Cocaine: NOT DETECTED
Opiates: NOT DETECTED
Tetrahydrocannabinol: NOT DETECTED

## 2023-06-08 LAB — GLUCOSE, CAPILLARY: Glucose-Capillary: 89 mg/dL (ref 70–99)

## 2023-06-08 LAB — PROTIME-INR
INR: 1.6 — ABNORMAL HIGH (ref 0.8–1.2)
Prothrombin Time: 19.5 s — ABNORMAL HIGH (ref 11.4–15.2)

## 2023-06-08 MED ORDER — POTASSIUM CHLORIDE 10 MEQ/100ML IV SOLN
10.0000 meq | Freq: Once | INTRAVENOUS | Status: AC
  Administered 2023-06-08: 10 meq via INTRAVENOUS
  Filled 2023-06-08: qty 100

## 2023-06-08 MED ORDER — ATORVASTATIN CALCIUM 80 MG PO TABS: 80.0000 mg | ORAL_TABLET | Freq: Every day | ORAL | Status: DC

## 2023-06-08 MED ORDER — ASPIRIN 81 MG PO TBEC
81.0000 mg | DELAYED_RELEASE_TABLET | Freq: Every day | ORAL | Status: DC
  Administered 2023-06-09 – 2023-06-13 (×5): 81 mg via ORAL
  Filled 2023-06-08 (×6): qty 1

## 2023-06-08 MED ORDER — ACETAMINOPHEN 650 MG RE SUPP: 650.0000 mg | Freq: Four times a day (QID) | RECTAL | Status: DC | PRN

## 2023-06-08 MED ORDER — ACETAMINOPHEN 325 MG PO TABS
650.0000 mg | ORAL_TABLET | Freq: Four times a day (QID) | ORAL | Status: DC | PRN
  Filled 2023-06-08: qty 2

## 2023-06-08 MED ORDER — ONDANSETRON HCL 4 MG PO TABS: 4.0000 mg | ORAL_TABLET | Freq: Four times a day (QID) | ORAL | Status: DC | PRN

## 2023-06-08 MED ORDER — STROKE: EARLY STAGES OF RECOVERY BOOK
Freq: Once | Status: AC
  Filled 2023-06-08: qty 1

## 2023-06-08 MED ORDER — POTASSIUM CHLORIDE IN NACL 40-0.9 MEQ/L-% IV SOLN
INTRAVENOUS | Status: AC
  Filled 2023-06-08: qty 1000

## 2023-06-08 MED ORDER — ONDANSETRON HCL 4 MG/2ML IJ SOLN: 4.0000 mg | Freq: Four times a day (QID) | INTRAMUSCULAR | Status: DC | PRN

## 2023-06-08 NOTE — ED Provider Notes (Addendum)
 Patient seen after prior EDP.  Patient with evidence of subacute CVA on CT.  Patient will be admitted.    Hospitalist service is aware of case.  Neurology (Dr. Armanda Magic) made aware of case. Neurology requests admission to [facility].   Wynetta Fines, MD 06/08/23 1727    Wynetta Fines, MD 06/08/23 [phone]

## 2023-06-08 NOTE — ED Notes (Signed)
[facility] contacted for transfer. 

## 2023-06-08 NOTE — ED Triage Notes (Signed)
 PT arrives to ED via POV from home co new onset right sided weakness, AMS, and aphasia that started 24-36 hours ago per caregiver and husband. Pt was hospitalized in January for multiple blood clots.

## 2023-06-08 NOTE — H&P (Signed)
 History and Physical    Patient: Tara Martin EAV:[id number] DOB: 06-Oct-1940 DOA: 06/08/2023 DOS: the patient was seen and examined on 06/08/2023 PCP: Kristian Covey, MD  Patient coming from: Home  Chief Complaint:  Chief Complaint  Patient presents with   Weakness   Aphasia   Altered Mental Status   HPI: Tara Martin is a 83 y.o. female with medical history significant of anxiety, osteoarthritis, varicella-zoster, systemic lupus erythematosus, mild cognitive impairment, late onset Alzheimer's, migraine headaches, prediabetes who was admitted at the beginning of the year with pulmonary embolism and diagnosed with right middle lobe mass who is presenting to the emergency department via EMS with progressively worse mental status associated with right-sided weakness, confusion and unsteady gait since yesterday.  However, the patient's husband and her friend Tara Martin that was present in the ED room and stated that she has been gradually declining in the last 2 to 3 months prior to the last 2 days.  She has been forgetful and has had increased frequency in episodes of confusion.  She is able to answer simple questions.  No headaches at this time, but apparently she was having 1 at home.  No vision changes.  No fever, chills, rhinorrhea, sore throat, wheezing or hemoptysis.  No chest pain, palpitations, diaphoresis, PND, orthopnea or pitting edema of the lower extremities.  No abdominal pain, nausea, emesis, diarrhea, constipation, melena or hematochezia.  No flank pain, dysuria, frequency or hematuria.  No polyuria, polydipsia, polyphagia or blurred vision.   Lab work: Urine analysis showed protein of 30 mg deciliter with positive nitrates and trace leukocyte esterase.  UDS was negative.  CBC showed a white count of 5.8, hemoglobin 8.0 g/dL platelets 782.  PT 19.5, INR 1.6 and PTT 30.  Alcohol level was less than 10 mg/dL.  CMP showed a potassium of 3.3 and CO2 of 20 mmol/L with a normal anion gap.   Glucose was 105 mg deciliter, the rest of the CMP measurements were normal.  Imaging: CT head without contrast with acute to subacute infarct in the left occipital lobe, new from prior.  No significant mass effect or midline shift.  No acute hemorrhage.  Small old cortical infarct in the high parietal region that is new from 2021.  CT cervical spine without contrast with no acute fracture or traumatic subluxation of the cervical spine.  Multilevel degenerative changes of the C-spine.  ED course: Initial vital signs were temperature 98.2 F, pulse 62, respiration 14, BP 169/75 mmHg O2 sat 100% on room air.   Review of Systems: As mentioned in the history of present illness. All other systems reviewed and are negative.  Past Medical History:  Diagnosis Date   Anxiety state, unspecified - sees Dr. Jennette Kettle in gyn and benzo prescribed there 04/09/2013   Arthritis    Chicken pox    Hx of blood clots    jan 2025   Lupus    MCI (mild cognitive impairment) 05/20/2020   Migraines    Prediabetes    Past Surgical History:  Procedure Laterality Date   BACK SURGERY  2000   CHOLECYSTECTOMY     DILATION AND CURETTAGE OF UTERUS  1973   GALLBLADDER SURGERY  1974   Social History:  reports that she has never smoked. She has never used smokeless tobacco. She reports that she does not currently use alcohol after a past usage of about 2.0 standard drinks of alcohol per week. She reports that she does not use drugs.  Allergies  Allergen Reactions   Codeine Other (See Comments)    Per patient, this made her "flip out"    Family History  Problem Relation Age of Onset   Arthritis Mother    Diabetes Mother    Heart disease Mother    Heart attack Mother    Pneumonia Father    COPD Sister    Alzheimer's disease Sister    Heart disease Brother    Pulmonary embolism Brother    Alzheimer's disease Brother    Migraines Daughter    Alcoholism Other    Arthritis Other    Hypertension Other    Sudden  death Other    Colon cancer Neg Hx    Esophageal cancer Neg Hx    Stomach cancer Neg Hx    Colon polyps Neg Hx     Prior to Admission medications   Medication Sig Start Date End Date Taking? Authorizing Provider  ALPRAZolam Prudy Feeler) 0.5 MG tablet Take 0.5 mg by mouth daily as needed for anxiety.   Yes Freddy Finner, MD  apixaban (ELIQUIS) 5 MG TABS tablet Take 1 tablet (5 mg total) by mouth 2 (two) times daily. 05/10/23  Yes Burchette, Elberta Fortis, MD  diclofenac sodium (VOLTAREN) 1 % GEL Apply 3 gm to 3 large joints up to 3 times a day.Dispense 3 tubes with 3 refills. Patient taking differently: Apply 3 g topically 3 (three) times daily as needed (for pain- large joints). 02/18/17  Yes Deveshwar, Janalyn Rouse, MD  donepezil (ARICEPT) 10 MG tablet Take 1 tablet (10 mg total) by mouth at bedtime. 05/25/23  Yes Dohmeier, Porfirio Mylar, MD  Multiple Vitamins-Minerals (MULTIVITAMIN WOMEN 50+) TABS Take 1 tablet by mouth daily with breakfast.   Yes [provider]  REFRESH OPTIVE ADVANCED PF 0.5-1-0.5 % SOLN Place 1 drop into both eyes 3 (three) times daily as needed (for irritation).   Yes [provider]    Physical Exam: Vitals:   06/08/23 1249 06/08/23 1330 06/08/23 1600 06/08/23 1652  BP:  (!) 160/70 (!) 145/71   Pulse:  64 61   Resp:  12 19   Temp:    97.9 F (36.6 C)  TempSrc:    Oral  SpO2:  100% 99%   Height: 5\' 5"  (1.651 m)      Physical Exam Vitals and nursing note reviewed.  Constitutional:      General: She is awake. She is not in acute distress.    Appearance: Normal appearance. She is ill-appearing.  HENT:     Head: Normocephalic.     Nose: No rhinorrhea.     Mouth/Throat:     Mouth: Mucous membranes are dry.  Eyes:     General: No scleral icterus.    Pupils: Pupils are equal, round, and reactive to light.  Neck:     Vascular: No JVD.  Cardiovascular:     Rate and Rhythm: Normal rate and regular rhythm.     Heart sounds: S1 normal and S2 normal.  Pulmonary:      Effort: Pulmonary effort is normal.     Breath sounds: Normal breath sounds. No wheezing, rhonchi or rales.  Abdominal:     General: There is no distension.     Palpations: Abdomen is soft.     Tenderness: There is no abdominal tenderness. There is no right CVA tenderness, left CVA tenderness or guarding.  Musculoskeletal:     Cervical back: Neck supple.     Right lower leg: No edema.  Left lower leg: No edema.  Skin:    General: Skin is warm and dry.  Neurological:     General: No focal deficit present.     Mental Status: She is alert and oriented to person, place, and time.     Sensory: Sensory deficit present.     Motor: Weakness present.     Coordination: Coordination abnormal.     Deep Tendon Reflexes: Reflexes abnormal.  Psychiatric:        Mood and Affect: Mood normal.        Behavior: Behavior normal. Behavior is cooperative.        Cognition and Memory: Cognition is impaired. Memory is impaired. She exhibits impaired recent memory and impaired remote memory.     Data Reviewed:  Results are pending, will review when available. EKG: Vent. rate 64 BPM PR interval 150 ms QRS duration 82 ms QT/QTcB 448/463 ms P-R-T axes 58 -3 42 Sinus rhythm Abnormal R-wave progression, early transition Borderline T wave abnormalities  Assessment and Plan: Principal Problem:   Acute CVA (cerebrovascular accident) (HCC) Inpatient/telemetry.. Frequent neurochecks. Consult PT and OT. Check fasting lipids. Check hemoglobin A1c. Check carotid Doppler. Check echocardiogram. Check MRI of brain. Risk factors modifications. Stroke [facility] will [facility] at [facility].  Active Problems:   Moderate late onset Alzheimer's dementia    without behavioral disturbance,  psychotic disturbance, mood disturbance, or anxiety (HCC) Continue donezepil 10 mg p.o. at bedtime. Supportive care.    SLE (systemic lupus erythematosus related syndrome) (HCC) Does not seem to be active. She is not  currently on medications.    Prediabetes Will be checking hemoglobin A1c.    Pulmonary embolism (HCC) The patient has been on apixaban twice daily.    Normocytic anemia Monitor hematocrit and hemoglobin. Will check type and screen tomorrow morning at [facility].    Advance Care Planning:   Code Status: Full Code   Consults: Neurohospitalist team.  Family Communication: Her husband was at bedside.  Severity of Illness: The appropriate patient status for this patient is INPATIENT. Inpatient status is judged to be reasonable and necessary in order to provide the required intensity of service to ensure the patient's safety. The patient's presenting symptoms, physical exam findings, and initial radiographic and laboratory data in the context of their chronic comorbidities is felt to place them at high risk for further clinical deterioration. Furthermore, it is not anticipated that the patient will be medically stable for discharge from the hospital within 2 midnights of admission.   * I certify that at the point of admission it is my clinical judgment that the patient will require inpatient hospital care spanning beyond 2 midnights from the point of admission due to high intensity of service, high risk for further deterioration and high frequency of surveillance required.*  Author: Bobette Mo, MD 06/08/2023 5:02 PM  For on call review [email or web address].   This document was prepared using Dragon voice recognition software and may contain some unintended transcription errors.

## 2023-06-08 NOTE — Consult Note (Signed)
 NEUROLOGY CONSULT NOTE   Date of service: June 08, 2023 Patient Name: Tara Martin MRN:  [id number] DOB:  1940-06-19 Chief Complaint: "Weakness, speech difficulty, altered mental status" Requesting Provider: Bobette Mo, MD  History of Present Illness  Tara Martin is a 83 y.o. female with hx of PE on Eliquis, lupus, right middle lobe lung mass concerning for malignancy, dementia with last MMSE 18 on 05/25/2023 at the outpatient neurology clinic-presenting for evaluation of altered mental status and speech difficulty as well as difficulty with performing simple tasks such as keeping things in order and taking her pills with an unclear last known well-the caretaker at the bedside describes that she has been having a pretty sudden decline in her cognition and memory over the past few months.  More so after her hospitalization in January for the PE, she has been having more difficulty doing day-to-day tasks and ADLs.  She lives with her husband who is her caretaker but he himself is legally blind.  He manages her medicines and they report compliance to Eliquis. She was evaluated in the ED with an MRI of the brain that showed subacute appearing infarcts in the left PCA territory involving the occipital lobe and the thalamus as well as in the parietal subcortical white matter bilaterally.  Admitted for further workup and neurology consulted.  LKW: Unclear Modified rankin score: 3-Moderate disability-requires help but walks WITHOUT assistance IV Thrombolysis: Unclear last well, on Eliquis EVT: She has an unclear last known well  NIHSS components Score: Comment  1a Level of Conscious 0[x]  1[]  2[]  3[]      1b LOC Questions 0[]  1[]  2[x]       1c LOC Commands 0[x]  1[]  2[]       2 Best Gaze 0[]  1[x]  2[]       3 Visual 0[]  1[]  2[x]  3[]      4 Facial Palsy 0[x]  1[]  2[]  3[]      5a Motor Arm - left 0[x]  1[]  2[]  3[]  4[]  UN[]    5b Motor Arm - Right 0[]  1[x]  2[]  3[]  4[]  UN[]    6a Motor Leg - Left  0[x]  1[]  2[]  3[]  4[]  UN[]    6b Motor Leg - Right 0[x]  1[]  2[]  3[]  4[]  UN[]    7 Limb Ataxia 0[x]  1[]  2[]  3[]  UN[]     8 Sensory 0[]  1[]  2[x]  UN[]      9 Best Language 0[]  1[x]  2[]  3[]      10 Dysarthria 0[x]  1[]  2[]  UN[]      11 Extinct. and Inattention 0[]  1[x]  2[]       TOTAL: 10      ROS  Comprehensive ROS performed and pertinent positives documented in HPI    Past History   Past Medical History:  Diagnosis Date   Anxiety state, unspecified - sees Dr. Jennette Kettle in gyn and benzo prescribed there 04/09/2013   Arthritis    Chicken pox    Hx of blood clots    jan 2025   Lupus    MCI (mild cognitive impairment) 05/20/2020   Migraines    Prediabetes     Past Surgical History:  Procedure Laterality Date   BACK SURGERY  2000   CHOLECYSTECTOMY     DILATION AND CURETTAGE OF UTERUS  1973   GALLBLADDER SURGERY  1974    Family History: Family History  Problem Relation Age of Onset   Arthritis Mother    Diabetes Mother    Heart disease Mother    Heart attack Mother    Pneumonia Father  COPD Sister    Alzheimer's disease Sister    Heart disease Brother    Pulmonary embolism Brother    Alzheimer's disease Brother    Migraines Daughter    Alcoholism Other    Arthritis Other    Hypertension Other    Sudden death Other    Colon cancer Neg Hx    Esophageal cancer Neg Hx    Stomach cancer Neg Hx    Colon polyps Neg Hx     Social History  reports that she has never smoked. She has never used smokeless tobacco. She reports that she does not currently use alcohol after a past usage of about 2.0 standard drinks of alcohol per week. She reports that she does not use drugs.  Allergies  Allergen Reactions   Codeine Other (See Comments)    Per patient, this made her "flip out"    Medications   Current Facility-Administered Medications:    [START ON 06/09/2023]  stroke: early stages of recovery book, , Does not apply, Once, Bobette Mo, MD   0.9 % NaCl with KCl 40 mEq / L   infusion, , Intravenous, Continuous, Bobette Mo, MD, Last Rate: 42 mL/hr at 06/18/2023 1909, New Bag at Jun 18, 2023 1909   acetaminophen (TYLENOL) tablet 650 mg, 650 mg, Oral, Q6H PRN **OR** acetaminophen (TYLENOL) suppository 650 mg, 650 mg, Rectal, Q6H PRN, Bobette Mo, MD   ondansetron [facility]) tablet 4 mg, 4 mg, Oral, Q6H PRN **OR** ondansetron (ZOFRAN) injection 4 mg, 4 mg, Intravenous, Q6H PRN, Bobette Mo, MD  Vitals   Vitals:   Jun 18, 2023 1700 06/18/2023 1900 Jun 18, 2023 2056 18-Jun-2023 2100  BP: (!) 167/73 (!) 164/68 128/71 128/71  Pulse: 63 69 73 68  Resp: 14 14 (!) 22 18  Temp:      TempSrc:   Oral   SpO2: 99% 99% 98% 96%  Height:        Body mass index is 22.47 kg/m.  Physical Exam  General: Well-developed well-nourished in no acute distress HEENT: Normocephalic atraumatic Neck: Clear Cardiovascular: Regular rhythm Abdomen nondistended nontender Neurological exam Awake alert oriented to self.  Unable to tell me her age correctly or the current month. Mild aphasia-difficulty with comprehension, repetition but able to follow simple commands consistently. No dysarthria Cranial: Pupils equal round reactive to light, extraocular movements appear hindered with left gaze preference and inability to cross the gaze to look at the right.  She also has a right homonymous hemianopsia.  Face appears symmetric.  Tongue and palate midline. Motor examination with drift in the right upper extremity.  No drift in any other extremity but the right lower extremity is somewhat weaker than the left without vertical drift. Sensation-has significantly decreased sensation on the right to light touch in comparison to the left.  Also has neglects the right side on double 17 stimulation. Coordination: No gross dysmetria noted. Gait testing was deferred for patient safety  Labs/Imaging/Neurodiagnostic studies   CBC:  Recent Labs  Lab 06-18-2023 1305 18-Jun-2023 1317  WBC 5.8  --    NEUTROABS 4.0  --   HGB 8.0* 7.8*  HCT 26.8* 23.0*  MCV 81.5  --   PLT 236  --    Basic Metabolic Panel:  Lab Results  Component Value Date   NA 138 Jun 18, 2023   K 3.5 Jun 18, 2023   CO2 20 (L) 2023-06-18   GLUCOSE 99 2023-06-18   BUN 13 18-Jun-2023   CREATININE 0.80 06/18/2023   CALCIUM 9.2 2023-06-18  GFRNONAA >60 06/08/2023   GFRAA 77 02/18/2017   Lipid Panel:  Lab Results  Component Value Date   LDLCALC 129 (H) 12/11/2019   HgbA1c:  Lab Results  Component Value Date   HGBA1C 5.9 (A) 08/09/2022   Urine Drug Screen:     Component Value Date/Time   LABOPIA NONE DETECTED 06/08/2023 1658   COCAINSCRNUR NONE DETECTED 06/08/2023 1658   LABBENZ NONE DETECTED 06/08/2023 1658   AMPHETMU NONE DETECTED 06/08/2023 1658   THCU NONE DETECTED 06/08/2023 1658   LABBARB NONE DETECTED 06/08/2023 1658    Alcohol Level     Component Value Date/Time   ETH <10 06/08/2023 1305   INR  Lab Results  Component Value Date   INR 1.6 (H) 06/08/2023   APTT  Lab Results  Component Value Date   APTT 30 06/08/2023   Personally reviewed imaging MRI of the brain shows multifocal acute ischemia including the left occipital lobe, left temporal lobe, left thalamus and bilateral parietal subcortical white matter with no hemorrhage or mass effect.  Petechial hemorrhage noted within the posterior right hemisphere.  Also bilateral punctate cerebellar infarcts.  ASSESSMENT   Tara Martin is a 83 y.o. female past history of PE on Eliquis, spiculated lung mass concerning for malignancy amongst other comorbidities presenting for multiple complaints including cognitive decline, visual disturbances and weakness along with aphasia that has been going on for a few days-unclear last known well.  On examination, she has some aphasia right-sided hemianopsia, impaired gaze to the right and right upper extremity weakness MRI shows multifocal infarcts-acute to subacute involving the left PCA territory  (open left occipital lobe, left thalamus), bilateral parietal lobes and bilateral cerebellar infarcts.  Etiology of the acute subacute ischemic strokes-likely hypercoagulability from underlying malignancy given the recent PE and recent spiculated lung mass finding.   RECOMMENDATIONS  Admit to hospitalist Frequent neurochecks Telemetry Hold Eliquis for now-aspirin only. High intensity statin 2D echo CT angio head and neck PT, OT, speech therapy. No need for permissive hypertension.  Blood pressure goal-normotension. Management of the lung mass and expedited possible PET scan or biopsy-will defer to primary team and pulmonology. May need Lovenox, as Eliquis clearly has failed. Stroke team to follow Plan relayed to on-call hospitalist as the admitting hospitalist is already off shift.  ______________________________________________________________________    Signed, Milon Dikes, MD [facility]

## 2023-06-08 NOTE — ED Triage Notes (Signed)
 Pt arrived with caregiver and husband. AAOX3. Per husband and caregiver, patient started having slurred speech and difficulty expressing herself last night at 7pm. Endorses difficulty walking earlier that day around 9am. Caregiver reports R side weakness, unable to brush teeth or grip with R hand. MD pfeiffer made aware

## 2023-06-08 NOTE — ED Provider Notes (Signed)
 [place] [facility] DE[facility]MENT AT [facility] Provider Note   CSN: [id number] Arrival date & time: 06/08/23  1230     History  Chief Complaint  Patient presents with   Weakness   Aphasia   Altered Mental Status    Tara Martin is a 83 y.o. female.  HPI Patient lives home with her husband who is legally blind but also substantially a caregiver.  He reports that patient has had declining cognitive function over the past week that has been notable but even more so over the past 36 to 24 hours.  Patient is anticoagulated on Eliquis for prior history of significant PE.  Patient's husband reports that the patient was complaining of a headache fairly consistently over a week ago.  He reports the headache seem to migrate though from 1 side to the other and come and go.  Today the patient is denying any headache.  They also have a home health caregiver who has been present for observing changes.  Patient's husband reports that even as long as a week ago patient had a change in terms of her ability to read.  He advises they usually tape episodes of jeopardy and she does the reading.  Last week she was unable to read the TV screens.  As of yesterday morning upon awakening, patient's husband noted that she was having more difficulty standing up and walking.  He reports with assistance he was able to assist her in going to the bathroom and going up and down stairs.  However, she had a couple of episodes where she slumped to the ground.  Most recent 1 was yesterday evening.  Due to this they slept in the downstairs rooms last night.  He reports this morning when he tried to give her medications he had to physically place her medication in her mouth and she dropped a water bottle he handed to her.  There in-home caregiver observes that the patient had objectively weak appearance with use of her right leg and left arm.  The caregiver also reports that there has been a significant change in  baseline mental status and function over the past week and since yesterday.    Home Medications Prior to Admission medications   Medication Sig Start Date End Date Taking? Authorizing Provider  ALPRAZolam Prudy Feeler) 0.5 MG tablet Take 0.5 mg by mouth daily as needed for anxiety. Patient not taking: Reported on 05/25/2023    Freddy Finner, MD  apixaban (ELIQUIS) 5 MG TABS tablet Take 1 tablet (5 mg total) by mouth 2 (two) times daily. 05/10/23   Burchette, Elberta Fortis, MD  diclofenac sodium (VOLTAREN) 1 % GEL Apply 3 gm to 3 large joints up to 3 times a day.Dispense 3 tubes with 3 refills. 02/18/17   Pollyann Savoy, MD  donepezil (ARICEPT) 10 MG tablet Take 1 tablet (10 mg total) by mouth at bedtime. 05/25/23   Dohmeier, Porfirio Mylar, MD  Multiple Vitamins-Minerals (MULTIVITAMIN WOMEN 50+) TABS Take 1 tablet by mouth daily.    [provider]      Allergies    Codeine    Review of Systems   Review of Systems  Physical Exam Updated Vital Signs BP (!) 169/75 (BP Location: Left Arm)   Pulse 62   Temp 98.2 F (36.8 C) (Oral)   Resp 14   Ht 5\' 5"  (1.651 m)   SpO2 100%   BMI 22.47 kg/m  Physical Exam Constitutional:      Comments: Patient is  calm and resting with her eyes closed.  With interaction she opens her eyes and responds.  No respiratory distress.  Well-nourished well-developed.  Good physical condition for age.  HENT:     Head: Normocephalic and atraumatic.     Nose: Nose normal.     Mouth/Throat:     Mouth: Mucous membranes are moist.     Pharynx: Oropharynx is clear.  Eyes:     Comments: Was about 2 to 3 mm symmetric responsive.  Patient has gaze avoidance to the right.  Cardiovascular:     Rate and Rhythm: Normal rate and regular rhythm.  Pulmonary:     Effort: Pulmonary effort is normal.     Breath sounds: Normal breath sounds.  Abdominal:     General: There is no distension.     Palpations: Abdomen is soft.     Tenderness: There is no abdominal tenderness. There  is no guarding.  Musculoskeletal:        General: No swelling, tenderness, deformity or signs of injury.     Cervical back: Neck supple.  Skin:    General: Skin is warm and dry.  Neurological:     Comments: Patient is awake and responds to vocal interactions.  She is calm and pleasant.  She seems to have limited historical information.  No significant recall for recent events.  Patient has gaze avoidance to the right.  Findings consistent with neglect.  As I speak to her on the right side of her body she thinks that the nurse sitting next to her on the left is the only person present.  I have to cross her midline in order for her to acknowledge me.  Patient has right pronator drift.  Right drift of the lower right extremity as well.  Grip strength bilateral hands intact.  Psychiatric:        Mood and Affect: Mood normal.     ED Results / Procedures / Treatments   Labs (all labs ordered are listed, but only abnormal results are displayed) Labs Reviewed  ETHANOL  PROTIME-INR  APTT  CBC  DIFFERENTIAL  COMPREHENSIVE METABOLIC PANEL WITH GFR  RAPID URINE DRUG SCREEN, HOSP PERFORMED  URINALYSIS, ROUTINE W REFLEX MICROSCOPIC  CBG MONITORING, ED  I-STAT CHEM 8, ED    EKG EKG Interpretation Date/Time:  Wednesday June 08 2023 12:43:01 EDT Ventricular Rate:  64 PR Interval:  150 QRS Duration:  82 QT Interval:  448 QTC Calculation: 463 R Axis:   -3  Text Interpretation: Sinus rhythm Abnormal R-wave progression, early transition Borderline T wave abnormalities no sig change from previous, no acute ischemic appearance. Confirmed by Arby Barrette [phone]) on 06/08/2023 1:12:38 PM  Radiology No results found.  Procedures Procedures   CRITICAL CARE Performed by: Arby Barrette   Total critical care time: 30 minutes  Critical care time was exclusive of separately billable procedures and treating other patients.  Critical care was necessary to treat or prevent imminent or  life-threatening deterioration.  Critical care was time spent personally by me on the following activities: development of treatment plan with patient and/or surrogate as well as nursing, discussions with consultants, evaluation of patient's response to treatment, examination of patient, obtaining history from patient or surrogate, ordering and performing treatments and interventions, ordering and review of laboratory studies, ordering and review of radiographic studies, pulse oximetry and re-evaluation of patient's condition.  Medications Ordered in ED Medications - No data to display  ED Course/ Medical Decision Making/ A&P  Medical Decision Making Amount and/or Complexity of Data Reviewed Labs: ordered. Radiology: ordered.   Patient presents as outlined.  She is anticoagulant Eliquis for history of PE.  EMR review indicates patient has had advancing symptoms of dementia with cognitive impairment.  At baseline however it sounds like she is quite high functioning in her home with her husband.  She had significant cognitive change after hospitalization that involved multiple thrombus PE earlier in the year.  Home health provider reports that the patient was driving independently and had coag good cognitive function up until February.  Acutely however over the past 1 to 2 weeks there have been significant declines in function as outlined.  Husband described the patient complaining of headaches a couple weeks ago.  At this time differential diagnosis includes subarachnoid hemorrhage\intracerebral hemorrhage\stroke\metabolic derangement.  Actively patient has neglect to the right.  I have high suspicion for intracerebral hemorrhage or stroke.  Patient is outside of any window.  Symptoms have been incremental at least over a week and most dramatic symptoms present for greater than 72 hours.  I have personally reviewed CT head and do not see intracranial bleed.  Area  suspicious for stroke.  Radiology read pending.  [place] to follow-up on CT results.  At this time high probability of CVA but out of window for lytics and patient is actively anticoagulated on Eliquis with history of prior DVT.        Final Clinical Impression(s) / ED Diagnoses Final diagnoses:  None    Rx / DC Orders ED Discharge Orders     None         Arby Barrette, MD 06/08/23 [phone]

## 2023-06-09 ENCOUNTER — Other Ambulatory Visit (HOSPITAL_COMMUNITY): Payer: Self-pay

## 2023-06-09 ENCOUNTER — Inpatient Hospital Stay (HOSPITAL_COMMUNITY)

## 2023-06-09 ENCOUNTER — Encounter (HOSPITAL_COMMUNITY): Payer: Self-pay

## 2023-06-09 DIAGNOSIS — I6389 Other cerebral infarction: Secondary | ICD-10-CM | POA: Diagnosis not present

## 2023-06-09 DIAGNOSIS — I2699 Other pulmonary embolism without acute cor pulmonale: Secondary | ICD-10-CM

## 2023-06-09 DIAGNOSIS — I639 Cerebral infarction, unspecified: Secondary | ICD-10-CM | POA: Diagnosis not present

## 2023-06-09 DIAGNOSIS — R911 Solitary pulmonary nodule: Secondary | ICD-10-CM | POA: Diagnosis not present

## 2023-06-09 LAB — COMPREHENSIVE METABOLIC PANEL WITH GFR
ALT: 10 U/L (ref 0–44)
AST: 19 U/L (ref 15–41)
Albumin: 2.9 g/dL — ABNORMAL LOW (ref 3.5–5.0)
Alkaline Phosphatase: 65 U/L (ref 38–126)
Anion gap: 10 (ref 5–15)
BUN: 11 mg/dL (ref 8–23)
CO2: 19 mmol/L — ABNORMAL LOW (ref 22–32)
Calcium: 8.6 mg/dL — ABNORMAL LOW (ref 8.9–10.3)
Chloride: 106 mmol/L (ref 98–111)
Creatinine, Ser: 0.79 mg/dL (ref 0.44–1.00)
GFR, Estimated: >60 mL/min (ref >60)
Glucose, Bld: 90 mg/dL (ref 70–99)
Potassium: 3.6 mmol/L (ref 3.5–5.1)
Sodium: 135 mmol/L (ref 135–145)
Total Bilirubin: 0.3 mg/dL (ref 0.0–1.2)
Total Protein: 6.2 g/dL — ABNORMAL LOW (ref 6.5–8.1)

## 2023-06-09 LAB — CBC
HCT: 23.9 % — ABNORMAL LOW (ref 36.0–46.0)
Hemoglobin: 7.3 g/dL — ABNORMAL LOW (ref 12.0–15.0)
MCH: 24.4 pg — ABNORMAL LOW (ref 26.0–34.0)
MCHC: 30.5 g/dL (ref 30.0–36.0)
MCV: 79.9 fL — ABNORMAL LOW (ref 80.0–100.0)
Platelets: 203 10*3/uL (ref 150–400)
RBC: 2.99 MIL/uL — ABNORMAL LOW (ref 3.87–5.11)
RDW: 16 % — ABNORMAL HIGH (ref 11.5–15.5)
WBC: 5 10*3/uL (ref 4.0–10.5)
nRBC: 0 % (ref 0.0–0.2)

## 2023-06-09 LAB — RETICULOCYTES
Immature Retic Fract: 17.3 % — ABNORMAL HIGH (ref 2.3–15.9)
RBC.: 2.99 MIL/uL — ABNORMAL LOW (ref 3.87–5.11)
Retic Count, Absolute: 44.8 10*3/uL (ref 19.0–186.0)
Retic Ct Pct: 1.5 % (ref 0.4–3.1)

## 2023-06-09 LAB — VITAMIN B12: Vitamin B-12: 273 pg/mL (ref 180–914)

## 2023-06-09 LAB — IRON AND TIBC
Iron: 24 ug/dL — ABNORMAL LOW (ref 28–170)
Saturation Ratios: 6 % — ABNORMAL LOW (ref 10.4–31.8)
TIBC: 379 ug/dL (ref 250–450)
UIBC: 355 ug/dL

## 2023-06-09 LAB — LIPID PANEL
Cholesterol: 136 mg/dL (ref 0–200)
HDL: 23 mg/dL — ABNORMAL LOW (ref >40)
LDL Cholesterol: 82 mg/dL (ref 0–99)
Total CHOL/HDL Ratio: 5.9 ratio
Triglycerides: 155 mg/dL — ABNORMAL HIGH (ref <150)
VLDL: 31 mg/dL (ref 0–40)

## 2023-06-09 LAB — FOLATE: Folate: 20.5 ng/mL (ref >5.9)

## 2023-06-09 LAB — ECHOCARDIOGRAM COMPLETE BUBBLE STUDY
Area-P 1/2: 2.35 cm2
S' Lateral: 2.7 cm

## 2023-06-09 LAB — ABO/RH: ABO/RH(D): O POS

## 2023-06-09 LAB — APTT: aPTT: 37 s — ABNORMAL HIGH (ref 24–36)

## 2023-06-09 LAB — PREPARE RBC (CROSSMATCH)

## 2023-06-09 LAB — FERRITIN: Ferritin: 31 ng/mL (ref 11–307)

## 2023-06-09 LAB — HEPARIN LEVEL (UNFRACTIONATED)
Heparin Unfractionated: >1.1 [IU]/mL — ABNORMAL HIGH (ref 0.30–0.70)
Heparin Unfractionated: 0.6 [IU]/mL (ref 0.30–0.70)

## 2023-06-09 MED ORDER — ATORVASTATIN CALCIUM 10 MG PO TABS
20.0000 mg | ORAL_TABLET | Freq: Every day | ORAL | Status: DC
  Administered 2023-06-09 – 2023-06-13 (×5): 20 mg via ORAL
  Filled 2023-06-09 (×6): qty 2

## 2023-06-09 MED ORDER — SODIUM CHLORIDE 0.9% IV SOLUTION: Freq: Once | INTRAVENOUS | Status: AC

## 2023-06-09 MED ORDER — HEPARIN (PORCINE) 25000 UT/250ML-% IV SOLN
1000.0000 [IU]/h | INTRAVENOUS | Status: AC
  Administered 2023-06-09: 750 [IU]/h via INTRAVENOUS
  Filled 2023-06-09: qty 250

## 2023-06-09 MED ORDER — IOHEXOL 350 MG/ML SOLN
60.0000 mL | Freq: Once | INTRAVENOUS | Status: AC | PRN
  Administered 2023-06-09: 60 mL via INTRAVENOUS

## 2023-06-09 NOTE — Progress Notes (Signed)
   06/09/23 1546  TOC Brief Assessment  Insurance and Status Reviewed Administrator Medicare HMO PPO)  Patient has primary care physician Yes (Available  Burchette, Elberta Fortis, MD)  Home environment has been reviewed from home with Spouse  Prior level of function: Somewhat independent  Prior/Current Home Services No current home services  Social Drivers of Health Review SDOH reviewed no interventions necessary  Readmission risk has been reviewed Yes (17%)  Transition of care needs no transition of care needs at this time   Patient has been recommended CIR and CIR is following  Please place a TOC consult for any DC needs

## 2023-06-09 NOTE — Progress Notes (Signed)
 VASCULAR LAB    Carotid duplex has been performed.  See CV proc for preliminary results.   Alvera Tourigny, RVT 06/09/2023, 10:47 AM

## 2023-06-09 NOTE — Progress Notes (Signed)
  Inpatient Rehab Admissions Coordinator :  Per therapy recommendations, patient was screened for CIR candidacy by Ottie Glazier RN MSN.  At this time patient appears to be a potential candidate for CIR. I will place a rehab consult per protocol for full assessment. Please call me with any questions.  Ottie Glazier RN MSN Admissions Coordinator [phone]

## 2023-06-09 NOTE — Plan of Care (Signed)
  Problem: Ischemic Stroke/TIA Tissue Perfusion: Goal: Complications of ischemic stroke/TIA will be minimized Outcome: Progressing   Problem: Coping: Goal: Will verbalize positive feelings about self Outcome: Progressing Goal: Will identify appropriate support needs Outcome: Progressing   Problem: Self-Care: Goal: Ability to participate in self-care as condition permits will improve Outcome: Progressing   Problem: Nutrition: Goal: Risk of aspiration will decrease Outcome: Progressing Goal: Dietary intake will improve Outcome: Progressing

## 2023-06-09 NOTE — Progress Notes (Signed)
 STROKE TEAM PROGRESS NOTE   SUBJECTIVE (INTERVAL HISTORY) Her husband is at the bedside.  Overall her condition is stable.  Patient still pleasantly demented, speaking incoherently, distractible, impaired attention and concentration.  Seem to have right neglect intermittently.   OBJECTIVE Temp:  [97.5 F (36.4 C)-98.3 F (36.8 C)] 98.3 F (36.8 C) (04/03 1600) Pulse Rate:  [59-73] 68 (04/03 1600) Cardiac Rhythm: Normal sinus rhythm (04/03 0700) Resp:  [14-22] 17 (04/03 1600) BP: (128-167)/(55-83) 148/77 (04/03 1600) SpO2:  [90 %-100 %] 95 % (04/03 1600)  Recent Labs  Lab 06/08/23 1246 06/08/23 2109  GLUCAP 90 89   Recent Labs  Lab 06/08/23 1305 06/08/23 1317 06/09/23 0422  NA 135 138 135  K 3.3* 3.5 3.6  CL 104 107 106  CO2 20*  --  19*  GLUCOSE 105* 99 90  BUN 16 13 11   CREATININE 0.84 0.80 0.79  CALCIUM 9.2  --  8.6*  MG 2.1  --   --   PHOS 3.7  --   --    Recent Labs  Lab 06/08/23 1305 06/09/23 0422  AST 22 19  ALT 11 10  ALKPHOS 75 65  BILITOT 0.6 0.3  PROT 7.8 6.2*  ALBUMIN 3.7 2.9*   Recent Labs  Lab 06/08/23 1305 06/08/23 1317 06/09/23 0422  WBC 5.8  --  5.0  NEUTROABS 4.0  --   --   HGB 8.0* 7.8* 7.3*  HCT 26.8* 23.0* 23.9*  MCV 81.5  --  79.9*  PLT 236  --  203   No results for input(s): "CKTOTAL", "CKMB", "CKMBINDEX", "TROPONINI" in the last 168 hours. Recent Labs    06/08/23 1305  LABPROT 19.5*  INR 1.6*   Recent Labs    06/08/23 1658  COLORURINE YELLOW  LABSPEC 1.017  PHURINE 6.0  GLUCOSEU NEGATIVE  HGBUR NEGATIVE  BILIRUBINUR NEGATIVE  KETONESUR NEGATIVE  PROTEINUR 30*  NITRITE POSITIVE*  LEUKOCYTESUR TRACE*       Component Value Date/Time   CHOL 136 06/09/2023 0422   TRIG 155 (H) 06/09/2023 0422   HDL 23 (L) 06/09/2023 0422   CHOLHDL 5.9 06/09/2023 0422   VLDL 31 06/09/2023 0422   LDLCALC 82 06/09/2023 0422   LDLCALC 129 (H) 12/11/2019 0908   Lab Results  Component Value Date   HGBA1C 5.5 06/08/2023       Component Value Date/Time   LABOPIA NONE DETECTED 06/08/2023 1658   COCAINSCRNUR NONE DETECTED 06/08/2023 1658   LABBENZ NONE DETECTED 06/08/2023 1658   AMPHETMU NONE DETECTED 06/08/2023 1658   THCU NONE DETECTED 06/08/2023 1658   LABBARB NONE DETECTED 06/08/2023 1658    Recent Labs  Lab 06/08/23 1305  ETH <10    I have personally reviewed the radiological images below and agree with the radiology interpretations.  VAS US CAROTID Result Date: 06/09/2023 Carotid Arterial Duplex Study Patient Name:  Tara Martin  Date of Exam:   06/09/2023 Medical Rec #: [id number]         Accession #:    [id number] Date of Birth: 27-May-1940         Patient Gender: F Patient Age:   83 years Exam Location:  [facility] Procedure:      VAS US CAROTID Referring Phys: DAVID ORTIZ --------------------------------------------------------------------------------  Indications:       CVA, Speech disturbance, Numbness and Weakness. Risk Factors:      Hyperlipidemia, no history of smoking. Other Factors:     Late onset Alzheimer's, Lupus, right middle  lobe lung mass,                    question malignancy. Comparison Study:  No prior study Performing Technologist: Sherren Kerns RVS  Examination Guidelines: A complete evaluation includes B-mode imaging, spectral Doppler, color Doppler, and power Doppler as needed of all accessible portions of each vessel. Bilateral testing is considered an integral part of a complete examination. Limited examinations for reoccurring indications may be performed as noted.  Right Carotid Findings: +----------+--------+--------+--------+------------------+--------+           PSV cm/sEDV cm/sStenosisPlaque DescriptionComments +----------+--------+--------+--------+------------------+--------+ CCA Prox  98      19                                         +----------+--------+--------+--------+------------------+--------+ CCA Distal78      16                                          +----------+--------+--------+--------+------------------+--------+ ICA Prox  75      27              heterogenous               +----------+--------+--------+--------+------------------+--------+ ICA Mid   82      27                                         +----------+--------+--------+--------+------------------+--------+ ICA Distal130     33                                tortuous +----------+--------+--------+--------+------------------+--------+ ECA       77      9                                          +----------+--------+--------+--------+------------------+--------+ +----------+--------+-------+--------+-------------------+           PSV cm/sEDV cmsDescribeArm Pressure (mmHG) +----------+--------+-------+--------+-------------------+ Subclavian101                                        +----------+--------+-------+--------+-------------------+ +---------+--------+--+--------+--+ VertebralPSV cm/s60EDV cm/s17 +---------+--------+--+--------+--+  Left Carotid Findings: +----------+--------+--------+--------+------------------+--------+           PSV cm/sEDV cm/sStenosisPlaque DescriptionComments +----------+--------+--------+--------+------------------+--------+ CCA Prox  84      16                                         +----------+--------+--------+--------+------------------+--------+ CCA Distal70      17                                         +----------+--------+--------+--------+------------------+--------+ ICA Prox  38      10              heterogenous               +----------+--------+--------+--------+------------------+--------+  ICA Mid   89      26                                         +----------+--------+--------+--------+------------------+--------+ ICA Distal79      25                                         +----------+--------+--------+--------+------------------+--------+ ECA       114      11                                         +----------+--------+--------+--------+------------------+--------+ +----------+--------+--------+--------+-------------------+           PSV cm/sEDV cm/sDescribeArm Pressure (mmHG) +----------+--------+--------+--------+-------------------+ Subclavian69                                          +----------+--------+--------+--------+-------------------+ +---------+--------+--+--------+--+ VertebralPSV cm/s41EDV cm/s15 +---------+--------+--+--------+--+   Summary: Right Carotid: The extracranial vessels were near-normal with only minimal wall                thickening or plaque. Left Carotid: The extracranial vessels were near-normal with only minimal wall               thickening or plaque. Vertebrals:  Bilateral vertebral arteries demonstrate antegrade flow. Subclavians: Normal flow hemodynamics were seen in bilateral subclavian              arteries. *See table(s) above for measurements and observations.     Preliminary    CT ANGIO HEAD NECK W WO CM Result Date: 06/09/2023 CLINICAL DATA:  83 year old female neurologic deficit. Multifocal infarcts in the anterior and posterior circulation on MRI yesterday. EXAM: CT ANGIOGRAPHY HEAD AND NECK WITH AND WITHOUT CONTRAST TECHNIQUE: Multidetector CT imaging of the head and neck was performed using the standard protocol during bolus administration of intravenous contrast. Multiplanar CT image reconstructions and MIPs were obtained to evaluate the vascular anatomy. Carotid stenosis measurements (when applicable) are obtained utilizing NASCET criteria, using the distal internal carotid diameter as the denominator. RADIATION DOSE REDUCTION: This exam was performed according to the departmental dose-optimization program which includes automated exposure control, adjustment of the mA and/or kV according to patient size and/or use of iterative reconstruction technique. CONTRAST:  60mL OMNIPAQUE IOHEXOL 350  MG/ML SOLN COMPARISON:  Brain MRI and head CT yesterday. CTA chest 03/09/2023. FINDINGS: CT HEAD Brain: Hypodense cytotoxic edema most pronounced in the left PCA territory, stable there from the CT yesterday. Increased visualization of cytotoxic edema in the left thalamus since yesterday (series 5, image 14). Other small areas of abnormal diffusion on MRI remain occult by CT. No acute intracranial hemorrhage identified. No acute intracranial hemorrhage identified. Stable ventricle size and configuration. Normal basilar cisterns. Calvarium and skull base: Intact, stable. Paranasal sinuses: Visualized paranasal sinuses and mastoids are stable and well aerated. Orbits: No acute orbit or scalp soft tissue finding. CTA NECK Skeleton: Cervical spine degeneration. No acute osseous abnormality identified. Upper chest: Paraseptal emphysema or scattered pneumatocele is. Abnormal bulky superior mediastinal lymphadenopathy, appears malignant (series 10, image 168) and progressed since January. Suspected primary  right middle lobe lung mass at that time not included today. Other neck: Thoracic inlet metastatic lymphadenopathy, bilateral lower neck level 4 stations is progressed since January (series 10, image 141). No other metastatic disease identified in the neck. Aortic arch: 3 vessel arch.  No significant arch atherosclerosis. Right carotid system: Tortuosity. Patent with no significant plaque or stenosis through the right carotid bifurcation. 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 The saccular component is also roughly 8 mm. No associated stenosis. Left carotid system: Tortuous left CCA and patent left carotid bifurcation without stenosis. However, attenuated right ICA distal to the bulb, circumferentially narrow due to 2-3 mm diameter. This remains patent to the skull  base, with a small area of fusiform aneurysmal dilatation measuring 5-6 mm just below the skull base series 17, image 30. Vertebral arteries: Proximal subclavian arteries, cervical vertebral arteries are patent with tortuosity, no significant plaque or stenosis to the skull base. CTA HEAD Posterior circulation: Right vertebral artery is mildly dominant throughout. Patent distal vertebral arteries and vertebrobasilar junction with tortuosity but no significant plaque or stenosis. Patent PICA origins. Patent basilar artery without stenosis. Patent SCA and left PCA origins. Fetal type right PCA origin. Prominent left posterior communicating artery also, which in light of the below left ICA siphon findings is probably collateral supply to the left ICA terminus. Left PCA is severely irregular and attenuated beginning at the junction of the P1 and posterior communicating artery, series 15, image 20, and P3 branches are poorly enhancing. Contralateral right PCA branches appear more normal, mildly irregular. Anterior circulation:  Both ICA siphons are patent. But the left petrous ICA is highly irregular and stenotic as seen on series 12, image 126, with probable soft plaque (series 14, image 152). Left ICA remains patent, appears more normal in the cavernous and supraclinoid segments. Contralateral right ICA siphon is ectatic throughout, patent without stenosis. Both posterior communicating artery origins are normal. Patent carotid termini, MCA and ACA origins. Left ACA A1 is dominant and mildly irregular. Anterior communicating artery is diminutive but patent. Bilateral ACA branches are patent with mild irregularity. Right MCA M1 segment and bifurcation are patent without stenosis. Left MCA M1 segment and bifurcation are patent without stenosis. Bilateral MCA branches are patent with mild irregularity. Venous sinuses: Patent. Anatomic variants: Dominant right vertebral artery, right ACA A1, fetal type right PCA origin.  Review of the MIP images confirms the above findings IMPRESSION: 1. Progressed since January and bulky Metastatic Lymphadenopathy in the visible mediastinum, bilateral lower neck/thoracic inlet. 2. Positive for Left PCA severe stenosis, ultimately occluded in the P3 segments, concordant with acute ischemia demonstrated by MRI. 3. Positive also for highly abnormal bilateral Internal Carotid Arteries: - Attenuated, Stenotic cervical Left ICA and Left siphon, including High-grade stenosis in the petrous segment. Evidence of collateral supply to the Left ICA terminus from the posterior communicating artery, posterior circulation. 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 4. No other hemodynamically significant arterial stenosis in the head or neck. 5. Expected CT appearance of ischemia demonstrated by MRI yesterday. No hemorrhagic transformation or intracranial mass effect. Electronically Signed   By: Odessa Fleming M.D.   On: 06/09/2023 09:21   MR BRAIN WO CONTRAST Result Date:  06/08/2023 CLINICAL DATA:  Acute neurologic deficit EXAM: MRI HEAD WITHOUT CONTRAST TECHNIQUE: Multiplanar, multiecho pulse sequences of the brain and surrounding structures were obtained without intravenous contrast. COMPARISON:  None Available. FINDINGS: Brain: Multifocal abnormal diffusion restriction, including the left occipital lobe, left temporal lobe, left thalamus and within the bilateral parietal subcortical white matter. Petechial hemorrhage within the posterior right hemisphere. There is multifocal hyperintense T2-weighted signal within the white matter. Generalized volume loss. The midline structures are normal. Vascular: Normal flow voids. Skull and upper cervical spine: Normal calvarium and skull base. Visualized upper cervical spine and soft tissues are normal. Sinuses/Orbits:No paranasal sinus fluid levels or  advanced mucosal thickening. No mastoid or middle ear effusion. Normal orbits. IMPRESSION: 1. Multifocal acute ischemia, including the left occipital lobe, left temporal lobe, left thalamus and within the bilateral parietal subcortical white matter. No hemorrhage or mass effect. 2. Petechial hemorrhage within the posterior right hemisphere. Electronically Signed   By: Deatra Robinson M.D.   On: 06/08/2023 20:06   CT Head Wo Contrast Addendum Date: 06/08/2023 ADDENDUM REPORT: 06/08/2023 16:43 ADDENDUM: These results were called by telephone at the time of interpretation on 06/08/2023 at 4:37 pm to provider Dr. Rodena Medin, who verbally acknowledged these results. Electronically Signed   By: Darliss Cheney M.D.   On: 06/08/2023 16:43   Result Date: 06/08/2023 CLINICAL DATA:  Mental status change EXAM: CT HEAD WITHOUT CONTRAST TECHNIQUE: Contiguous axial images were obtained from the base of the skull through the vertex without intravenous contrast. RADIATION DOSE REDUCTION: This exam was performed according to the departmental dose-optimization program which includes automated exposure control, adjustment of the mA and/or kV according to patient size and/or use of iterative reconstruction technique. COMPARISON:  MRI brain 02/24/2021.  Head CT 02/02/2020. FINDINGS: Brain: Focal area of hypodensity with loss of gray-white matter distinction is seen in the left occipital lobe, new from prior. No significant mass effect or midline shift, but there is associated sulcal effacement. No acute intracranial hemorrhage or hydrocephalus. There is mild diffuse atrophy. There is a small old cortical infarct in the high right parietal region which is new from 2021. Vascular: No hyperdense vessel or unexpected calcification. Skull: Normal. Negative for fracture or focal lesion. Sinuses/Orbits: No acute finding. Other: None. IMPRESSION: 1. Acute to subacute infarct in the left occipital lobe, new from prior. No significant mass effect or  midline shift. No acute hemorrhage. 2. Small old cortical infarct in the high right parietal region, new from 2021. Electronically Signed: By: Darliss Cheney M.D. On: 06/08/2023 16:32   CT Cervical Spine Wo Contrast Result Date: 06/08/2023 CLINICAL DATA:  Trauma EXAM: CT CERVICAL SPINE WITHOUT CONTRAST TECHNIQUE: Multidetector CT imaging of the cervical spine was performed without intravenous contrast. Multiplanar CT image reconstructions were also generated. RADIATION DOSE REDUCTION: This exam was performed according to the departmental dose-optimization program which includes automated exposure control, adjustment of the mA and/or kV according to patient size and/or use of iterative reconstruction technique. COMPARISON:  Cervical spine CT 02/02/2020 FINDINGS: Alignment: There is 2 mm of anterolisthesis at C7-T1 which is favored is degenerative and unchanged. Alignment is otherwise anatomic. Skull base and vertebrae: No acute fracture. No primary bone lesion or focal pathologic process. Soft tissues and spinal canal: No prevertebral fluid or swelling. No visible canal hematoma. Disc levels: There is moderate severe disc space narrowing and endplate osteophyte formation at C4-C5, C5-C6 and C6-C7 compatible with degenerative change. There is diffuse bilateral facet arthropathy causing mild neural foraminal stenosis most  significant at C3-C4, C4-C5 and C5-C6 on right. Posterior disc osteophyte complexes are seen at C4-C5, C5-C6 and C6-C7 causing mild central canal stenosis. Upper chest: Negative. Other: None. IMPRESSION: 1. No acute fracture or traumatic subluxation of the cervical spine. 2. Multilevel degenerative changes of the cervical spine. Electronically Signed   By: Darliss Cheney M.D.   On: 06/08/2023 16:36     PHYSICAL EXAM  Temp:  [97.5 F (36.4 C)-98.3 F (36.8 C)] 98.3 F (36.8 C) (04/03 1600) Pulse Rate:  [59-73] 68 (04/03 1600) Resp:  [14-22] 17 (04/03 1600) BP: (128-167)/(55-83) 148/77  (04/03 1600) SpO2:  [90 %-100 %] 95 % (04/03 1600)  General - Well nourished, well developed, in no apparent distress. Seems happy and pleasant   Ophthalmologic - fundi not visualized due to noncooperation.  Cardiovascular - Regular rhythm and rate.  Neuro - awake, alert, eyes open, orientated to self and people, not continue to age, time or situation. Fluent language output, but incoherent with intermittent word salad.  Following most simple commands with prompt and repetitive request.  Mild to moderate perseveration.  Able to name 1/3 and repeat well. No gaze palsy, tracking more on the left, left tracking to the right, seems to have right visual neglect, not blinking to visual threat bilaterally. No facial droop. Tongue midline. Bilateral UEs 5/5, no drift. Bilaterally LEs 5/5, no drift.  However needed repetitive request to have right upper and lower extremity movement.  Sensation symmetrical bilaterally subjectively, b/l no ataxia by observation however difficult to follow commands for finger-to-nose testing, gait not tested.    ASSESSMENT/PLAN Tara Martin is a 83 y.o. 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 No TNK given due to outside window.    Stroke: Multifocal infarcts, embolic pattern, concerning for cardioembolic source versus hypercarbia state from advanced malignancy CT left occipital infarct CTA head and neck left PCA severe stenosis, occluded at left P3, Attenuated, Stenotic cervical Left ICA and Left siphon, including High-grade stenosis in the petrous segment. Evidence of collateral supply to the Left ICA terminus from the posterior communicating artery, posterior circulation. 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 MRI  Multifocal acute ischemia, including the left occipital lobe, left temporal lobe, left thalamus and within the bilateral parietal subcortical white matter. Petechial hemorrhage within the posterior right hemisphere. 2D Echo EF 60 to 65% Carotid Doppler negative LDL 82 HgbA1c 5.5 UDS negative Heparin IV for VTE prophylaxis Eliquis (apixaban) daily prior to admission, now on heparin IV pending lung biopsy Ongoing aggressive stroke risk factor management Therapy recommendations: CIR Disposition: Pending  Probable advanced malignancy Hypercarbia state Known right middle lobe mass, pending biopsy CT head and neck showed progressed bulky metastatic lymphadenopathy in the visible mediastinum, bilateral lower neck/thoracic inlet. 03/2023 bilateral DVT and PE Pulmonary on board for biopsy On heparin IV per stroke protocol  Hypertension Stable on the high end Avoid low BP Long term BP goal normotensive  Hyperlipidemia Home meds: None LDL 82, goal < 70 Now on Lipitor 20 No high intensity statin given LDL not far from goal and advanced age Continue statin at discharge  Dementia Likely mixed of Alzheimer disease and vascular dementia Apoe testing showed 2 copies of E4 MRI also showed old left  MCA/PCA, right parietal and right MCA/ACA infarcts. Follows with [facility] Dr. Vickey Huger Pulmonary left  Other Stroke Risk Factors Advanced age History of PE and bilateral DVT in 03/2023, on Eliquis  Other Active Problems Anemia of chronic disease, hemoglobin 7.3 UA WBC 11-20  Hospital day # 1  I discussed with Dr. Randol Kern. I spent extensive face-to-face time with the patient, more than 50% of which was spent in counseling and coordination of care, reviewing test results, images and medication, and discussing the diagnosis, treatment plan and potential prognosis. This patient's care requiresreview of multiple databases, neurological assessment, discussion  with family, other specialists and medical decision making of high complexity.    Marvel Plan, MD PhD Stroke Neurology 06/09/2023 4:31 PM    To contact Stroke Continuity provider, please refer to [email or web address]. After hours, contact General Neurology

## 2023-06-09 NOTE — Progress Notes (Signed)
 PROGRESS NOTE    Tara Martin  MWU:[id number] DOB: 1940/06/12 DOA: 06/08/2023 PCP: Kristian Covey, MD    Chief Complaint  Patient presents with   Weakness   Aphasia   Altered Mental Status    Brief Narrative:    Tara Martin is a 83 y.o. female with medical history significant of anxiety, osteoarthritis, varicella-zoster, systemic lupus erythematosus, mild cognitive impairment, late onset Alzheimer's, migraine headaches, prediabetes who was admitted at the beginning of the year with pulmonary embolism and diagnosed with right middle lobe mass who is presenting to the emergency department via EMS with progressively worse mental status associated with right-sided weakness, confusion and unsteady gait, her workup significant for acute CVA, admitted for further workup   Assessment & Plan:   Principal Problem:   Acute CVA (cerebrovascular accident) (HCC) Active Problems:   SLE (systemic lupus erythematosus related syndrome) (HCC)   Prediabetes   Pulmonary embolism (HCC)   Moderate late onset Alzheimer's dementia without behavioral disturbance, psychotic disturbance, mood disturbance, or anxiety (HCC)   Normocytic anemia   Acute CVA (cerebrovascular accident) (HCC) -Patient MRI significant for acute CVA, most likely of cardioembolic source, it is concerning that it happened while on Eliquis, which may indicate hypercoagulable status regnancy (please see discussion below under pulmonary mass) -PT, OT, SLP consulted, recommendation for CIR -started on heparin GTT, stroke protocol pending further workup and recommendation per pulmonary   Moderate late onset Alzheimer's dementia   without behavioral disturbance,  psychotic disturbance, mood disturbance, or anxiety (HCC) Continue donezepil 10 mg p.o. at bedtime. Supportive care.   SLE (systemic lupus erythematosus related syndrome) (HCC) Does not seem to be active. She is not currently on medications.    Prediabetes A1c  is 5.5  Pulmonary mass -Follow on CT chest, PCCM to evaluate today   Pulmonary embolism (HCC) The patient has been on apixaban twice daily.  Please switch to heparin GTT   Normocytic anemia Continue to monitor and transfuse as needed -Hgb is down to 7.3 this morning, given she is on heparin GTT, I will transfuse 1 unit PRBC      DVT prophylaxis: Heparin GTT Code Status: (Full) Family Communication: (Cussed with husband at bedside) Disposition:   Status is: Inpatient    Consultants:  Neurology Marshall Cork  Subjective: No significant events overnight, she denies any complaints  Objective: Vitals:   06/09/23 0400 06/09/23 0832 06/09/23 1200 06/09/23 1600  BP: (!) 130/55 (!) 158/83 136/82 (!) 148/77  Pulse: (!) 59 72 73 68  Resp: 20 17 18 17   Temp: 97.8 F (36.6 C) 98 F (36.7 C) 97.9 F (36.6 C) 98.3 F (36.8 C)  TempSrc: Oral Oral Oral Oral  SpO2: 94% 98% 90% 95%  Height:        Intake/Output Summary (Last 24 hours) at 06/09/2023 1622 Last data filed at 06/09/2023 0700 Gross per 24 hour  Intake --  Output 750 ml  Net -750 ml   There were no vitals filed for this visit.  Examination:  Awake Alert, Oriented X 2, she has mild dementia at baseline, but overall present Symmetrical Chest wall movement, Good air movement bilaterally, CTAB RRR,No Gallops,Rubs or new Murmurs, No Parasternal Heave +ve B.Sounds, Abd Soft, No tenderness, No rebound - guarding or rigidity. No Cyanosis, Clubbing or edema, No new Rash or bruise      Data Reviewed: I have personally reviewed following labs and imaging studies  CBC: Recent Labs  Lab 06/08/23 1305 06/08/23 1317 06/09/23 0422  WBC 5.8  --  5.0  NEUTROABS 4.0  --   --   HGB 8.0* 7.8* 7.3*  HCT 26.8* 23.0* 23.9*  MCV 81.5  --  79.9*  PLT 236  --  203    Basic Metabolic Panel: Recent Labs  Lab 06/08/23 1305 06/08/23 1317 06/09/23 0422  NA 135 138 135  K 3.3* 3.5 3.6  CL 104 107 106  CO2 20*  --  19*   GLUCOSE 105* 99 90  BUN 16 13 11   CREATININE 0.84 0.80 0.79  CALCIUM 9.2  --  8.6*  MG 2.1  --   --   PHOS 3.7  --   --     GFR: CrCl cannot be calculated (Unknown ideal weight.).  Liver Function Tests: Recent Labs  Lab 06/08/23 1305 06/09/23 0422  AST 22 19  ALT 11 10  ALKPHOS 75 65  BILITOT 0.6 0.3  PROT 7.8 6.2*  ALBUMIN 3.7 2.9*    CBG: Recent Labs  Lab 06/08/23 1246 06/08/23 2109  GLUCAP 90 89     No results found for this or any previous visit (from the past 240 hours).       Radiology Studies: VAS US CAROTID Result Date: 06/09/2023 Carotid Arterial Duplex Study Patient Name:  Tara Martin  Date of Exam:   06/09/2023 Medical Rec #: [id number]         Accession #:    [id number] Date of Birth: 09-01-1940         Patient Gender: F Patient Age:   76 years Exam Location:  [facility] Procedure:      VAS US CAROTID Referring Phys: DAVID ORTIZ --------------------------------------------------------------------------------  Indications:       CVA, Speech disturbance, Numbness and Weakness. Risk Factors:      Hyperlipidemia, no history of smoking. Other Factors:     Late onset Alzheimer's, Lupus, right middle lobe lung mass,                    question malignancy. Comparison Study:  No prior study Performing Technologist: Sherren Kerns RVS  Examination Guidelines: A complete evaluation includes B-mode imaging, spectral Doppler, color Doppler, and power Doppler as needed of all accessible portions of each vessel. Bilateral testing is considered an integral part of a complete examination. Limited examinations for reoccurring indications may be performed as noted.  Right Carotid Findings: +----------+--------+--------+--------+------------------+--------+           PSV cm/sEDV cm/sStenosisPlaque DescriptionComments +----------+--------+--------+--------+------------------+--------+ CCA Prox  98      19                                          +----------+--------+--------+--------+------------------+--------+ CCA Distal78      16                                         +----------+--------+--------+--------+------------------+--------+ ICA Prox  75      27              heterogenous               +----------+--------+--------+--------+------------------+--------+ ICA Mid   82      27                                         +----------+--------+--------+--------+------------------+--------+  ICA Distal130     33                                tortuous +----------+--------+--------+--------+------------------+--------+ ECA       77      9                                          +----------+--------+--------+--------+------------------+--------+ +----------+--------+-------+--------+-------------------+           PSV cm/sEDV cmsDescribeArm Pressure (mmHG) +----------+--------+-------+--------+-------------------+ Subclavian101                                        +----------+--------+-------+--------+-------------------+ +---------+--------+--+--------+--+ VertebralPSV cm/s60EDV cm/s17 +---------+--------+--+--------+--+  Left Carotid Findings: +----------+--------+--------+--------+------------------+--------+           PSV cm/sEDV cm/sStenosisPlaque DescriptionComments +----------+--------+--------+--------+------------------+--------+ CCA Prox  84      16                                         +----------+--------+--------+--------+------------------+--------+ CCA Distal70      17                                         +----------+--------+--------+--------+------------------+--------+ ICA Prox  38      10              heterogenous               +----------+--------+--------+--------+------------------+--------+ ICA Mid   89      26                                         +----------+--------+--------+--------+------------------+--------+ ICA Distal79      25                                          +----------+--------+--------+--------+------------------+--------+ ECA       114     11                                         +----------+--------+--------+--------+------------------+--------+ +----------+--------+--------+--------+-------------------+           PSV cm/sEDV cm/sDescribeArm Pressure (mmHG) +----------+--------+--------+--------+-------------------+ [id number]                                          +----------+--------+--------+--------+-------------------+ +---------+--------+--+--------+--+ VertebralPSV cm/s41EDV cm/s15 +---------+--------+--+--------+--+   Summary: Right Carotid: The extracranial vessels were near-normal with only minimal wall                thickening or plaque. Left Carotid: The extracranial vessels were near-normal with only minimal wall               thickening or plaque. Vertebrals:  Bilateral vertebral arteries demonstrate antegrade flow. Subclavians: Normal flow hemodynamics were seen in bilateral subclavian              arteries. *See table(s) above for measurements and observations.     Preliminary    CT ANGIO HEAD NECK W WO CM Result Date: 06/09/2023 CLINICAL DATA:  83 year old female neurologic deficit. Multifocal infarcts in the anterior and posterior circulation on MRI yesterday. EXAM: CT ANGIOGRAPHY HEAD AND NECK WITH AND WITHOUT CONTRAST TECHNIQUE: Multidetector CT imaging of the head and neck was performed using the standard protocol during bolus administration of intravenous contrast. Multiplanar CT image reconstructions and MIPs were obtained to evaluate the vascular anatomy. Carotid stenosis measurements (when applicable) are obtained utilizing NASCET criteria, using the distal internal carotid diameter as the denominator. RADIATION DOSE REDUCTION: This exam was performed according to the departmental dose-optimization program which includes automated exposure control, adjustment of the  mA and/or kV according to patient size and/or use of iterative reconstruction technique. CONTRAST:  60mL OMNIPAQUE IOHEXOL 350 MG/ML SOLN COMPARISON:  Brain MRI and head CT yesterday. CTA chest 03/09/2023. FINDINGS: CT HEAD Brain: Hypodense cytotoxic edema most pronounced in the left PCA territory, stable there from the CT yesterday. Increased visualization of cytotoxic edema in the left thalamus since yesterday (series 5, image 14). Other small areas of abnormal diffusion on MRI remain occult by CT. No acute intracranial hemorrhage identified. No acute intracranial hemorrhage identified. Stable ventricle size and configuration. Normal basilar cisterns. Calvarium and skull base: Intact, stable. Paranasal sinuses: Visualized paranasal sinuses and mastoids are stable and well aerated. Orbits: No acute orbit or scalp soft tissue finding. CTA NECK Skeleton: Cervical spine degeneration. No acute osseous abnormality identified. Upper chest: Paraseptal emphysema or scattered pneumatocele is. Abnormal bulky superior mediastinal lymphadenopathy, appears malignant (series 10, image 168) and progressed since January. Suspected primary right middle lobe lung mass at that time not included today. Other neck: Thoracic inlet metastatic lymphadenopathy, bilateral lower neck level 4 stations is progressed since January (series 10, image 141). No other metastatic disease identified in the neck. Aortic arch: 3 vessel arch.  No significant arch atherosclerosis. Right carotid system: Tortuosity. Patent with no significant plaque or stenosis through the right carotid bifurcation. 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 The saccular component is also roughly 8 mm. No associated stenosis. Left carotid system: Tortuous left CCA and patent left carotid bifurcation without stenosis. However,  attenuated right ICA distal to the bulb, circumferentially narrow due to 2-3 mm diameter. This remains patent to the skull base, with a small area of fusiform aneurysmal dilatation measuring 5-6 mm just below the skull base series 17, image 30. Vertebral arteries: Proximal subclavian arteries, cervical vertebral arteries are patent with tortuosity, no significant plaque or stenosis to the skull base. CTA HEAD Posterior circulation: Right vertebral artery is mildly dominant throughout. Patent distal vertebral arteries and vertebrobasilar junction with tortuosity but no significant plaque or stenosis. Patent PICA origins. Patent basilar artery without stenosis. Patent SCA and left PCA origins. Fetal type right PCA origin. Prominent left posterior communicating artery also, which in light of the below left ICA siphon findings is probably collateral supply to the left ICA terminus. Left PCA is severely irregular and attenuated beginning at the junction of the P1 and posterior communicating artery, series 15, image 20, and  P3 branches are poorly enhancing. Contralateral right PCA branches appear more normal, mildly irregular. Anterior circulation:  Both ICA siphons are patent. But the left petrous ICA is highly irregular and stenotic as seen on series 12, image 126, with probable soft plaque (series 14, image 152). Left ICA remains patent, appears more normal in the cavernous and supraclinoid segments. Contralateral right ICA siphon is ectatic throughout, patent without stenosis. Both posterior communicating artery origins are normal. Patent carotid termini, MCA and ACA origins. Left ACA A1 is dominant and mildly irregular. Anterior communicating artery is diminutive but patent. Bilateral ACA branches are patent with mild irregularity. Right MCA M1 segment and bifurcation are patent without stenosis. Left MCA M1 segment and bifurcation are patent without stenosis. Bilateral MCA branches are patent with mild irregularity.  Venous sinuses: Patent. Anatomic variants: Dominant right vertebral artery, right ACA A1, fetal type right PCA origin. Review of the MIP images confirms the above findings IMPRESSION: 1. Progressed since January and bulky Metastatic Lymphadenopathy in the visible mediastinum, bilateral lower neck/thoracic inlet. 2. Positive for Left PCA severe stenosis, ultimately occluded in the P3 segments, concordant with acute ischemia demonstrated by MRI. 3. Positive also for highly abnormal bilateral Internal Carotid Arteries: - Attenuated, Stenotic cervical Left ICA and Left siphon, including High-grade stenosis in the petrous segment. Evidence of collateral supply to the Left ICA terminus from the posterior communicating artery, posterior circulation. 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 4. No other hemodynamically significant arterial stenosis in the head or neck. 5. Expected CT appearance of ischemia demonstrated by MRI yesterday. No hemorrhagic transformation or intracranial mass effect. Electronically Signed   By: Odessa Fleming M.D.   On: 06/09/2023 09:21   MR BRAIN WO CONTRAST Result Date: 06/08/2023 CLINICAL DATA:  Acute neurologic deficit EXAM: MRI HEAD WITHOUT CONTRAST TECHNIQUE: Multiplanar, multiecho pulse sequences of the brain and surrounding structures were obtained without intravenous contrast. COMPARISON:  None Available. FINDINGS: Brain: Multifocal abnormal diffusion restriction, including the left occipital lobe, left temporal lobe, left thalamus and within the bilateral parietal subcortical white matter. Petechial hemorrhage within the posterior right hemisphere. There is multifocal hyperintense T2-weighted signal within the white matter. Generalized volume loss. The midline structures are normal. Vascular: Normal flow voids. Skull and upper cervical spine: Normal calvarium and  skull base. Visualized upper cervical spine and soft tissues are normal. Sinuses/Orbits:No paranasal sinus fluid levels or advanced mucosal thickening. No mastoid or middle ear effusion. Normal orbits. IMPRESSION: 1. Multifocal acute ischemia, including the left occipital lobe, left temporal lobe, left thalamus and within the bilateral parietal subcortical white matter. No hemorrhage or mass effect. 2. Petechial hemorrhage within the posterior right hemisphere. Electronically Signed   By: Deatra Robinson M.D.   On: 06/08/2023 20:06   CT Head Wo Contrast Addendum Date: 06/08/2023 ADDENDUM REPORT: 06/08/2023 16:43 ADDENDUM: These results were called by telephone at the time of interpretation on 06/08/2023 at 4:37 pm to provider Dr. Rodena Medin, who verbally acknowledged these results. Electronically Signed   By: Darliss Cheney M.D.   On: 06/08/2023 16:43   Result Date: 06/08/2023 CLINICAL DATA:  Mental status change EXAM: CT HEAD WITHOUT CONTRAST TECHNIQUE: Contiguous axial images were obtained from the base of the skull through the vertex without intravenous contrast. RADIATION DOSE REDUCTION: This exam was performed according to the departmental dose-optimization program which includes automated exposure control, adjustment of the mA and/or kV  according to patient size and/or use of iterative reconstruction technique. COMPARISON:  MRI brain 02/24/2021.  Head CT 02/02/2020. FINDINGS: Brain: Focal area of hypodensity with loss of gray-white matter distinction is seen in the left occipital lobe, new from prior. No significant mass effect or midline shift, but there is associated sulcal effacement. No acute intracranial hemorrhage or hydrocephalus. There is mild diffuse atrophy. There is a small old cortical infarct in the high right parietal region which is new from 2021. Vascular: No hyperdense vessel or unexpected calcification. Skull: Normal. Negative for fracture or focal lesion. Sinuses/Orbits: No acute finding. Other:  None. IMPRESSION: 1. Acute to subacute infarct in the left occipital lobe, new from prior. No significant mass effect or midline shift. No acute hemorrhage. 2. Small old cortical infarct in the high right parietal region, new from 2021. Electronically Signed: By: Darliss Cheney M.D. On: 06/08/2023 16:32   CT Cervical Spine Wo Contrast Result Date: 06/08/2023 CLINICAL DATA:  Trauma EXAM: CT CERVICAL SPINE WITHOUT CONTRAST TECHNIQUE: Multidetector CT imaging of the cervical spine was performed without intravenous contrast. Multiplanar CT image reconstructions were also generated. RADIATION DOSE REDUCTION: This exam was performed according to the departmental dose-optimization program which includes automated exposure control, adjustment of the mA and/or kV according to patient size and/or use of iterative reconstruction technique. COMPARISON:  Cervical spine CT 02/02/2020 FINDINGS: Alignment: There is 2 mm of anterolisthesis at C7-T1 which is favored is degenerative and unchanged. Alignment is otherwise anatomic. Skull base and vertebrae: No acute fracture. No primary bone lesion or focal pathologic process. Soft tissues and spinal canal: No prevertebral fluid or swelling. No visible canal hematoma. Disc levels: There is moderate severe disc space narrowing and endplate osteophyte formation at C4-C5, C5-C6 and C6-C7 compatible with degenerative change. There is diffuse bilateral facet arthropathy causing mild neural foraminal stenosis most significant at C3-C4, C4-C5 and C5-C6 on right. Posterior disc osteophyte complexes are seen at C4-C5, C5-C6 and C6-C7 causing mild central canal stenosis. Upper chest: Negative. Other: None. IMPRESSION: 1. No acute fracture or traumatic subluxation of the cervical spine. 2. Multilevel degenerative changes of the cervical spine. Electronically Signed   By: Darliss Cheney M.D.   On: 06/08/2023 16:36        Scheduled Meds:  aspirin EC  81 mg Oral Daily   atorvastatin  20 mg  Oral Daily   Continuous Infusions:  0.9 % NaCl with KCl 40 mEq / L 42 mL/hr at 06/08/23 1909   heparin 750 Units/hr (06/09/23 1455)     LOS: 1 day       Huey Bienenstock, MD [facility]   To contact the attending provider between 7A-7P or the covering provider during after hours 7P-7A, please log into the web site [email or web address] and access using universal [place] password for that web site. If you do not have the password, please call the hospital operator.  06/09/2023, 4:22 PM

## 2023-06-09 NOTE — Progress Notes (Signed)
  Echocardiogram 2D Echocardiogram has been performed.  Leda Roys RDCS 06/09/2023, 10:26 AM

## 2023-06-09 NOTE — Consult Note (Signed)
 NAME:  Tara Martin, MRN:  [id number], DOB:  [birth year], LOS: 1 ADMISSION DATE:  06/08/2023, CONSULTATION DATE:  06/09/2023 REFERRING MD:  Dr. Randol Kern - [facility] , CHIEF COMPLAINT:  Lung mass    History of Present Illness:  Tara Martin is a 83 year old female with a past medical history significant for recently diagnosed bilateral PEs anticoagulated with Eliquis, lupus, mild cognitive impairment in the setting of late onset Alzheimer's, prediabetes, and anxiety who presented to the ED at [facility] 4/2 for complaints of AMS, aphasia, and weakness.  Head CT obtained on admission revealed acute to subacute infarct in the left occipital lobe and small cortical infarct in the right parietal region in the setting of hypercoagulable state from likely underlying lung cancer.  Patient reports compliance with anticoagulation with Eliquis prior to admission.  Last dose of Eliquis was 4/2 at 9 AM per spouse.  Patient has been established with outpatient pulmonary and is pending lung mass workup with potential tissue sampling.  Given readmission with now new stroke despite anticoagulation pulmonary has been consulted to assist with potential tissue sampling while inpatient.  Pertinent  Medical History  Recently diagnosed bilateral PEs anticoagulated with Eliquis, lupus, mild cognitive impairment in the setting of late onset Alzheimer's, prediabetes, and anxiety  Significant Hospital Events: Including procedures, antibiotic start and stop dates in addition to other pertinent events   4/2 presented with altered mental status, aphasia, and weakness workup revealed new CVA, admitted per hospitalist 4/3 pulmonary consulted for assistance in working up lung mass  Interim History / Subjective:  Seen lying in be with no acute complaints  Family at bedside and updated   Objective   Blood pressure (!) 158/83, pulse 72, temperature 98 F (36.7 C), temperature source Oral, resp. rate 17, height 5\' 5"  (1.651 m),  SpO2 98%.        Intake/Output Summary (Last 24 hours) at 06/09/2023 0841 Last data filed at 06/09/2023 0700 Gross per 24 hour  Intake --  Output 750 ml  Net -750 ml   There were no vitals filed for this visit.  Examination: General: Acute on chronically ill appearing thin elderly female lying in bed, in NAD HEENT: [place]/ATT, MM pink/moist, PERRL,  Neuro: Alert and oriented x3, non-focal  CV: s1s2 regular rate and rhythm, no murmur, rubs, or gallops,  PULM:  Clear to auscultation, no increased work of breathing, on RA  GI: soft, bowel sounds active in all 4 quadrants, non-tender, non-distended, tolerating oral diet Extremities: warm/dry, no edema  Skin: no rashes or lesions  Resolved Hospital Problem list     Assessment & Plan:  Pulmonary nodule -2.5 cm somewhat spiculated mass to right middle lobe adjacent to cardiac border and mediastinal lymph node measuring 10 mm for seen on CTA chest 03/09/2023  -Last dose of Eliquis per spouse was 4/2 at 0900 Bilateral pulmonary emboli -Seen on CTA chest 03/09/2023 started on Eliquis P: Obtain updated CT chest to evaluate lung mass Eliquis to remain on hold, can start IV heparin from our [facility] while  inpatient as she will need Eliqis washout Supplemental oxygen as needed for sat goal greater than 90 Encourage pulmonary hygiene Aspiration precautions Pending CT will discuss with attending, potential for tissue sampling while inpatient  Acute multifocal infarcts acute to subacute despite anticoagulation -MRI brain with multifocal acute ischemia including the left occipital lobe, left temporal lobe, left thalamus and bilateral parietal subcortical white matter with petechial hemorrhage P: Neurology following, appreciate assistance Eliquis remains on  hold per neurology Aspirin only   Best Practice (right click and "Reselect all SmartList Selections" daily)  Per primary   Labs   CBC: Recent Labs  Lab 06/08/23 1305 06/08/23 1317  06/09/23 0422  WBC 5.8  --  5.0  NEUTROABS 4.0  --   --   HGB 8.0* 7.8* 7.3*  HCT 26.8* 23.0* 23.9*  MCV 81.5  --  79.9*  PLT 236  --  203    Basic Metabolic Panel: Recent Labs  Lab 06/08/23 1305 06/08/23 1317 06/09/23 0422  NA 135 138 135  K 3.3* 3.5 3.6  CL 104 107 106  CO2 20*  --  19*  GLUCOSE 105* 99 90  BUN 16 13 11   CREATININE 0.84 0.80 0.79  CALCIUM 9.2  --  8.6*  MG 2.1  --   --   PHOS 3.7  --   --    GFR: CrCl cannot be calculated (Unknown ideal weight.). Recent Labs  Lab 06/08/23 1305 06/09/23 0422  WBC 5.8 5.0    Liver Function Tests: Recent Labs  Lab 06/08/23 1305 06/09/23 0422  AST 22 19  ALT 11 10  ALKPHOS 75 65  BILITOT 0.6 0.3  PROT 7.8 6.2*  ALBUMIN 3.7 2.9*   No results for input(s): "LIPASE", "AMYLASE" in the last 168 hours. No results for input(s): "AMMONIA" in the last 168 hours.  ABG    Component Value Date/Time   HCO3 18.0 (L) 03/09/2023 2005   TCO2 20 (L) 06/08/2023 1317   ACIDBASEDEF 5.0 (H) 03/09/2023 2005   O2SAT 90 03/09/2023 2005     Coagulation Profile: Recent Labs  Lab 06/08/23 1305  INR 1.6*    Cardiac Enzymes: No results for input(s): "CKTOTAL", "CKMB", "CKMBINDEX", "TROPONINI" in the last 168 hours.  HbA1C: Hemoglobin A1C  Date/Time Value Ref Range Status  08/09/2022 08:52 AM 5.9 (A) 4.0 - 5.6 % Final   Hgb A1c MFr Bld  Date/Time Value Ref Range Status  06/08/2023 01:05 PM 5.5 4.8 - 5.6 % Final    Comment:    (NOTE) Pre diabetes:          5.7%-6.4%  Diabetes:              >6.4%  Glycemic control for   <7.0% adults with diabetes   05/12/2020 02:47 PM 6.1 4.6 - 6.5 % Final    Comment:    Glycemic Control Guidelines for People with Diabetes:Non Diabetic:  <6%Goal of Therapy: <7%Additional Action Suggested:  >8%     CBG: Recent Labs  Lab 06/08/23 1246 06/08/23 2109  GLUCAP 90 89    Review of Systems:   Please see the history of present illness. All other systems reviewed and are  negative   Past Medical History:  She,  has a past medical history of Anxiety state, unspecified - sees Dr. Jennette Kettle in gyn and benzo prescribed there (04/09/2013), Arthritis, Chicken pox, blood clots, Lupus, MCI (mild cognitive impairment) (05/20/2020), Migraines, and Prediabetes.   Surgical History:   Past Surgical History:  Procedure Laterality Date   BACK SURGERY  2000   CHOLECYSTECTOMY     DILATION AND CURETTAGE OF UTERUS  1973   GALLBLADDER SURGERY  1974     Social History:   reports that she has never smoked. She has never used smokeless tobacco. She reports that she does not currently use alcohol after a past usage of about 2.0 standard drinks of alcohol per week. She reports that she does not use  drugs.   Family History:  Her family history includes Alcoholism in an other family member; Alzheimer's disease in her brother and sister; Arthritis in her mother and another family member; COPD in her sister; Diabetes in her mother; Heart attack in her mother; Heart disease in her brother and mother; Hypertension in an other family member; Migraines in her daughter; Pneumonia in her father; Pulmonary embolism in her brother; Sudden death in an other family member. There is no history of Colon cancer, Esophageal cancer, Stomach cancer, or Colon polyps.   Allergies Allergies  Allergen Reactions   Codeine Other (See Comments)    Per patient, this made her "flip out"     Home Medications  Prior to Admission medications   Medication Sig Start Date End Date Taking? Authorizing Provider  ALPRAZolam Prudy Feeler) 0.5 MG tablet Take 0.5 mg by mouth daily as needed for anxiety.   Yes Freddy Finner, MD  apixaban (ELIQUIS) 5 MG TABS tablet Take 1 tablet (5 mg total) by mouth 2 (two) times daily. 05/10/23  Yes Burchette, Elberta Fortis, MD  diclofenac sodium (VOLTAREN) 1 % GEL Apply 3 gm to 3 large joints up to 3 times a day.Dispense 3 tubes with 3 refills. Patient taking differently: Apply 3 g topically 3  (three) times daily as needed (for pain- large joints). 02/18/17  Yes Deveshwar, Janalyn Rouse, MD  donepezil (ARICEPT) 10 MG tablet Take 1 tablet (10 mg total) by mouth at bedtime. 05/25/23  Yes Dohmeier, Porfirio Mylar, MD  Multiple Vitamins-Minerals (MULTIVITAMIN WOMEN 50+) TABS Take 1 tablet by mouth daily with breakfast.   Yes [provider]  REFRESH OPTIVE ADVANCED PF 0.5-1-0.5 % SOLN Place 1 drop into both eyes 3 (three) times daily as needed (for irritation).   Yes [provider]     Critical care time: NA  Emersen Carroll D. Harris, NP-C [facility] Personal contact information can be found on Amion  If no contact or response made please call [phone] 06/09/2023, 10:30 AM

## 2023-06-09 NOTE — Progress Notes (Addendum)
  Inpatient Rehabilitation Admissions Coordinator   Met with patient, spouse and friend at bedside for rehab assessment. We discussed goals and expectations of a possible CIR admit.  Daughter flying in today from out of state. Spouse is legally blind. He is independent in his familiar surroundings enjoying carpentry work in his shop. Patient with dementia and mobilizes without AD prior to admit. They prefer CIR for rehab.  I will discuss further dispo upon daughter's arrival.I will ask Rehab MD to consult in the am. Please call me with any questions.   Ottie Glazier, RN, MSN Rehab Admissions Coordinator [phone]

## 2023-06-09 NOTE — Evaluation (Signed)
 Speech Language Pathology Evaluation Patient Details Name: Tara Martin MRN: [id number] DOB: [birth year] Today's Date: 06/09/2023 Time: 8295-6213 SLP Time Calculation (min) (ACUTE ONLY): 60 min  Problem List:  Patient Active Problem List   Diagnosis Date Noted   Acute CVA (cerebrovascular accident) (HCC) 06/08/2023   History of actinic keratosis 06/08/2023   Lentigo 06/08/2023   Squamous cell cancer of skin of right forearm 06/08/2023   Hemangioma of skin and subcutaneous tissue 06/08/2023   Normocytic anemia 06/08/2023   Moderate late onset Alzheimer's dementia without behavioral disturbance, psychotic disturbance, mood disturbance, or anxiety (HCC) 05/25/2023   Delirium due to another medical condition 05/25/2023   Mass of middle lobe of right lung 03/10/2023   Acute respiratory failure with hypoxia (HCC) 03/10/2023   Acute pulmonary embolism (HCC) 03/10/2023   Pulmonary embolism (HCC) 03/09/2023   MCI (mild cognitive impairment) 05/20/2020   Acute midline low back pain with bilateral sciatica 11/05/2014   Facial numbness 02/11/2014   Prediabetes    SLE (systemic lupus erythematosus related syndrome) (HCC) 04/09/2013   Anxiety state 04/09/2013   Past Medical History:  Past Medical History:  Diagnosis Date   Anxiety state, unspecified - sees Dr. Jennette Kettle in gyn and benzo prescribed there 04/09/2013   Arthritis    Chicken pox    Hx of blood clots    jan 2025   Lupus    MCI (mild cognitive impairment) 05/20/2020   Migraines    Prediabetes    Past Surgical History:  Past Surgical History:  Procedure Laterality Date   BACK SURGERY  2000   CHOLECYSTECTOMY     DILATION AND CURETTAGE OF UTERUS  1973   GALLBLADDER SURGERY  1974   HPI:  83 y.o female presenting to the emergency department via EMS with progressively worse mental status associated with right-sided weakness, confusion and unsteady gait.  MRI shows multifocal infarcts-acute to subacute involving the left PCA  territory (left occipital lobe, left temporal lobe, left thalamus), bilateral parietal lobes and bilateral cerebellar infarcts. Recent appt with Dr Vickey Huger, [facility] on 05/25/23, dx with moderate dementia (MMSE 18/30). PMH includes anxiety, osteoarthritis, varicella-zoster, systemic lupus erythematosus, migraine headaches, prediabetes.   Assessment / Plan / Recommendation Clinical Impression  Tara Martin presents with a posterior, fluent aphasia which impacts naming, yes/no reliability, and higher-level comprehension. Visual deficits and right neglect are exacerbated by poor awareness, affecting her ability to search her immediate environment and locate items or solve problems.  These acute deficits overlay more chronic changes in memory, planning, and organizing.  Tara Martin would benefit from intense rehab to maximize her independence at [facility]. She has a very supportive husband and family.  We discussed results/recommendations; they agree with plan.  (Portion of session was spent in co-evaluation with OT.)    SLP Assessment  SLP Recommendation/Assessment: Patient needs continued Speech Lanaguage Pathology Services SLP Visit Diagnosis: Aphasia (R47.01)    Recommendations for follow up therapy are one component of a multi-disciplinary discharge planning process, led by the attending physician.  Recommendations may be updated based on patient status, additional functional criteria and insurance authorization.    Follow Up Recommendations  Acute inpatient rehab (3hours/day)    Assistance Recommended at Discharge  Frequent or constant Supervision/Assistance  Functional Status Assessment Patient has had a recent decline in their functional status and demonstrates the ability to make significant improvements in function in a reasonable and predictable amount of time.  Frequency and Duration min 2x/week  2 weeks  SLP Evaluation Cognition  Overall Cognitive Status:  Impaired/Different from baseline Arousal/Alertness: Awake/alert Orientation Level: Oriented to person;Oriented to place;Disoriented to time;Disoriented to situation Attention: Sustained Sustained Attention: Impaired Sustained Attention Impairment: Verbal basic;Functional basic Memory: Impaired Memory Impairment: Other (comment) (impacted by aphasia) Awareness: Impaired       Comprehension  Auditory Comprehension Overall Auditory Comprehension: Impaired Yes/No Questions: Impaired Basic Immediate Environment Questions: 50-74% accurate Commands: Impaired One Step Basic Commands: 75-100% accurate Two Step Basic Commands: 50-74% accurate Conversation: Simple Interfering Components: Attention;Visual impairments;Processing speed Visual Recognition/Discrimination Discrimination: Exceptions to [facility] L/R Discrimination: Able for body parts Common Objects: Able for objects in room Reading Comprehension Reading Status: Not tested    Expression Expression Primary Mode of Expression: Verbal Verbal Expression Overall Verbal Expression: Impaired Initiation: No impairment Automatic Speech: Social Response Level of Generative/Spontaneous Verbalization: Conversation Repetition: No impairment Naming: Impairment Responsive: 76-100% accurate Confrontation: Impaired Common Objects: Able for objects in room Convergent: 50-74% accurate Verbal Errors: Phonemic paraphasias;Semantic paraphasias;Neologisms Written Expression Dominant Hand: Right   Oral / Motor  Oral Motor/Sensory Function Overall Oral Motor/Sensory Function: Within functional limits Motor Speech Overall Motor Speech: Appears within functional limits for tasks assessed            Blenda Mounts Laurice 06/09/2023, 2:21 PM Kailon Treese L. Samson Frederic, MA CCC/SLP Clinical Specialist - Acute Care SLP Acute Rehabilitation Services Office number [phone]

## 2023-06-09 NOTE — Progress Notes (Signed)
 PHARMACY - ANTICOAGULATION CONSULT NOTE  Pharmacy Consult for Heparin dosing Indication: stroke, hx PE  Allergies  Allergen Reactions   Codeine Other (See Comments)    Per patient, this made her "flip out"    Patient Measurements: Height: 5\' 5"  (165.1 cm) IBW/kg (Calculated) : 57  Vital Signs: Temp: 98.8 F (37.1 C) (04/03 2318) Temp Source: Oral (04/03 2318) BP: 127/87 (04/03 2318) Pulse Rate: 73 (04/03 2318)  Labs: Recent Labs    06/08/23 1305 06/08/23 1317 06/09/23 0422 06/09/23 1407 06/09/23 2150  HGB 8.0* 7.8* 7.3*  --   --   HCT 26.8* 23.0* 23.9*  --   --   PLT 236  --  203  --   --   APTT 30  --   --   --  37*  LABPROT 19.5*  --   --   --   --   INR 1.6*  --   --   --   --   HEPARINUNFRC  --   --   --  >1.10* 0.60  CREATININE 0.84 0.80 0.79  --   --    CrCl cannot be calculated (Unknown ideal weight.).  Medical History: Past Medical History:  Diagnosis Date   Anxiety state, unspecified - sees Dr. Jennette Kettle in gyn and benzo prescribed there 04/09/2013   Arthritis    Chicken pox    Hx of blood clots    jan 2025   Lupus    MCI (mild cognitive impairment) 05/20/2020   Migraines    Prediabetes     Assessment: Mrs. Mcmurphy is 83 YO female that was admitted 06/08/2023 for CVA. Previously on Eliquis 5 mg po BID for PE (03/09/2023). Anticipate need for biopsy for suspected lung malignancy. Pharmacy consulted for heparin dosing. PTA Eliquis last dose 4/2 0900. This will require aPTT monitoring until aPTT and antiXa are correlating.  Hgb 7, Platelets 200 stable  4/3 PM update:  aPTT sub-therapeutic   Goal of Therapy:  Heparin level 0.3-0.5 units/ml aPTT 66-85s Monitor platelets by anticoagulation protocol: Yes   Plan:  Inc heparin to 850 units/hr Heparin level and aPTT in 8 hours  Abran Duke, PharmD, BCPS Clinical Pharmacist Phone: [phone]

## 2023-06-09 NOTE — Plan of Care (Signed)

## 2023-06-09 NOTE — Evaluation (Signed)
 Physical Therapy Evaluation Patient Details Name: Tara Martin MRN: [id number] DOB: Oct 26, 1940 Today's Date: 06/09/2023  History of Present Illness  82 y.o female presenting to the emergency department 4/2 with progressively worse mental status associated with right-sided weakness, confusion and unsteady gait.  MRI shows multifocal infarcts-acute to subacute involving the left PCA territory (open left occipital lobe, left thalamus), bilateral parietal lobes and bilateral cerebellar infarcts. PMH significant of anxiety, osteoarthritis, varicella-zoster, systemic lupus erythematosus, mild cognitive impairment, late onset Alzheimer's, migraine headaches, prediabetes.   Clinical Impression  Pt admitted with above diagnosis. Accompanied by husband during evaluation, very supportive. Reports pt ind PTA with a cane. Demonstrates right sided ataxia, requiring min assist for transfers and mod assist for short distance gait during evaluation. Able to sustain grip on RW once Rt hand guided into place. Slight weakness in RLE see details below. No overt buckling. Demonstrates right neglect, does not cross midline with visual tracking but can turn head and eyes towards Rt with tactile cues. Patient will benefit from intensive inpatient follow-up therapy, >3 hours/day. Pt currently with functional limitations due to the deficits listed below (see PT Problem List). Pt will benefit from acute skilled PT to increase their independence and safety with mobility to allow discharge.           If plan is discharge home, recommend the following: A lot of help with walking and/or transfers;A lot of help with bathing/dressing/bathroom;Assistance with cooking/housework;Direct supervision/assist for medications management;Direct supervision/assist for financial management;Assist for transportation;Help with stairs or ramp for entrance;Supervision due to cognitive status   Can travel by private vehicle        Equipment  Recommendations  (TBD next venue)  Recommendations for Other Services  Rehab consult    Functional Status Assessment Patient has had a recent decline in their functional status and demonstrates the ability to make significant improvements in function in a reasonable and predictable amount of time.     Precautions / Restrictions Precautions Precautions: Fall Recall of Precautions/Restrictions: Impaired Restrictions Weight Bearing Restrictions Per Provider Order: No      Mobility  Bed Mobility Overal bed mobility: Needs Assistance Bed Mobility: Supine to Sit, Sit to Supine     Supine to sit: Min assist, HOB elevated Sit to supine: Min assist, HOB elevated   General bed mobility comments: Min assist for trunk support and to guide RUE with turning to EOB. LE support for sequencing back into bed with verbal cues for technique.    Transfers Overall transfer level: Needs assistance Equipment used: Rolling walker (2 wheels) Transfers: Sit to/from Stand Sit to Stand: Min assist           General transfer comment: Min assist for boost and balance. Multimodal cues for set-up and sequencing prior to rising, RUE assisted to find RW. Tactile cues to turn head and visually locate RUE and hand grip on RW.    Ambulation/Gait Ambulation/Gait assistance: Mod assist Gait Distance (Feet): 12 Feet Assistive device: Rolling walker (2 wheels) Gait Pattern/deviations: Step-through pattern, Step-to pattern, Decreased stride length, Decreased dorsiflexion - right, Shuffle, Ataxic, Drifts right/left, Wide base of support Gait velocity: dec Gait velocity interpretation: <1.31 ft/sec, indicative of household ambulator   General Gait Details: Educated on safe AD use with RW for support, with effort able to sustain grip with RUE on RW. Poor awareness of RLE placement, ataxic, needing assist with RW control and body placement with BOS of RW. No overt buckling. Mod assist for RW control and balance.  Multimodal cues for stepping backwards and sequencing to align with bed.  Stairs            Wheelchair Mobility     Tilt Bed    Modified Rankin (Stroke Patients Only) Modified Rankin (Stroke Patients Only) Pre-Morbid Rankin Score: No symptoms Modified Rankin: Moderately severe disability     Balance Overall balance assessment: Needs assistance Sitting-balance support: No upper extremity supported, Feet supported Sitting balance-Leahy Scale: Fair Sitting balance - Comments: Min assist initially posterior LOB, progressed to CGA remainder of session.   Standing balance support: Single extremity supported, During functional activity, Reliant on assistive device for balance Standing balance-Leahy Scale: Poor Standing balance comment: min assist, single UE support.                             Pertinent Vitals/Pain Pain Assessment Pain Assessment: No/denies pain    Home Living Family/patient expects to be discharged to:: Private residence Living Arrangements: Spouse/significant other Available Help at Discharge: Family;Friend(s);Available PRN/intermittently Type of Home: House Home Access: Stairs to enter Entrance Stairs-Rails: Left Entrance Stairs-Number of Steps: 3 on the side and 5 in the front one rail on side with brick wall and bil rails in front   Home Layout: Two level;Able to live on main level with bedroom/bathroom Home Equipment: BSC/3in1;Shower seat Additional Comments: lives with spouse who is visually impaired. Daughter reports she can stay as long as needed    Prior Function Prior Level of Function : Independent/Modified Independent;Driving             Mobility Comments: Pt reports no use of AD prior to hospitalization. ADLs Comments: ind     Extremity/Trunk Assessment   Upper Extremity Assessment Upper Extremity Assessment: Defer to OT evaluation    Lower Extremity Assessment Lower Extremity Assessment: RLE deficits/detail RLE  Deficits / Details: Ataxic, Ankle DF 4/5, PF 5/5, knee extension 4+/5 hip extension 4+/5, hip flexion 4/5, knee flexion 4+/5 RLE Sensation: decreased light touch;decreased proprioception RLE Coordination: decreased gross motor;decreased fine motor       Communication   Communication Communication: Impaired Factors Affecting Communication: Difficulty expressing self    Cognition Arousal: Alert Behavior During Therapy: WFL for tasks assessed/performed   PT - Cognitive impairments: Difficult to assess, Orientation, Attention, Sequencing, Problem solving, Safety/Judgement Difficult to assess due to: Impaired communication Orientation impairments: Place, Time, Situation                     Following commands: Impaired Following commands impaired: Only follows one step commands consistently     Cueing Cueing Techniques: Verbal cues     General Comments General comments (skin integrity, edema, etc.): SpO2 94% on RA.    Exercises     Assessment/Plan    PT Assessment Patient needs continued PT services  PT Problem List Decreased strength;Decreased activity tolerance;Decreased balance;Decreased mobility;Decreased coordination;Decreased cognition;Decreased knowledge of use of DME;Decreased safety awareness;Decreased knowledge of precautions;Impaired sensation       PT Treatment Interventions DME instruction;Gait training;Functional mobility training;Therapeutic activities;Therapeutic exercise;Balance training;Neuromuscular re-education;Cognitive remediation;Patient/family education;Stair training    PT Goals (Current goals can be found in the Care Plan section)  Acute Rehab PT Goals Patient Stated Goal: get well PT Goal Formulation: With patient/family Time For Goal Achievement: 06/23/23 Potential to Achieve Goals: Good    Frequency Min 3X/week     Co-evaluation               AM-PAC PT "  6 Clicks" Mobility  Outcome Measure Help needed turning from your back  to your side while in a flat bed without using bedrails?: A Little Help needed moving from lying on your back to sitting on the side of a flat bed without using bedrails?: A Little Help needed moving to and from a bed to a chair (including a wheelchair)?: A Lot Help needed standing up from a chair using your arms (e.g., wheelchair or bedside chair)?: A Lot Help needed to walk in hospital room?: A Lot Help needed climbing 3-5 steps with a railing? : Total 6 Click Score: 13    End of Session Equipment Utilized During Treatment: Gait belt Activity Tolerance: Patient tolerated treatment well Patient left: in bed;with call bell/phone within reach;with bed alarm set;with family/visitor present Nurse Communication: Mobility status PT Visit Diagnosis: Unsteadiness on feet (R26.81);Other abnormalities of gait and mobility (R26.89);Hemiplegia and hemiparesis;Ataxic gait (R26.0);Other symptoms and signs involving the nervous system (R29.898) Hemiplegia - Right/Left: Right Hemiplegia - caused by: Cerebral infarction    Time: 3086-5784 PT Time Calculation (min) (ACUTE ONLY): 28 min   Charges:   PT Evaluation $PT Eval Moderate Complexity: 1 Mod PT Treatments $Therapeutic Activity: 8-22 mins PT General Charges $$ ACUTE PT VISIT: 1 Visit         Kathlyn Sacramento, PT, DPT [facility]  Rehabilitation Services Physical Therapist Office: [phone] Website: [email or web address]   Berton Mount 06/09/2023, 3:44 PM

## 2023-06-09 NOTE — Evaluation (Addendum)
 Occupational Therapy Evaluation Patient Details Name: Tara Martin MRN: [id number] DOB: 15-Nov-1940 Today's Date: 06/09/2023   History of Present Illness   83 y.o female presenting to the emergency department via EMS with progressively worse mental status associated with right-sided weakness, confusion and unsteady gait.  MRI shows multifocal infarcts-acute to subacute involving the left PCA territory (open left occipital lobe, left thalamus), bilateral parietal lobes and bilateral cerebellar infarcts. PMH significant of anxiety, osteoarthritis, varicella-zoster, systemic lupus erythematosus, mild cognitive impairment, late onset Alzheimer's, migraine headaches, prediabetes     Clinical Impressions Pt resting comfortably in bed, working with SLP upon entry. Pt lives at home with husband who is legally blind but able to physically assist with ADLs/IADLs. Pt PLOF independent with cane. Pt currently presents with significant impairments to ADLs, transfers, and mobility. Pt has good overall strength, but has poor coordination and lack of awareness of deficits with R side neglect and some aphasia limiting participation in ADLs/transfers. Pt able to stand with min-mod A to maintain balance, good strength in BLEs, able to transfer to [facility] with min-mod A and maximal verbal cues to complete tasks, cueing to scan to R side. Pt max A for ADLs with cueing for sequencing and HOH assistance.  Recommending postacute intensive rehab >3hrs/day to maximize functional independence and participation with ADLs/mobility, family wishes to return home, husband worried he will have trouble getting around hospital being legally blind, but family verbalized need/want for intensive therapy. Will continue to see Pt acutely to progress as able.     If plan is discharge home, recommend the following:   A lot of help with walking and/or transfers;A lot of help with bathing/dressing/bathroom;Assistance with  cooking/housework;Assist for transportation;Help with stairs or ramp for entrance;Direct supervision/assist for medications management;Assistance with feeding     Functional Status Assessment   Patient has had a recent decline in their functional status and demonstrates the ability to make significant improvements in function in a reasonable and predictable amount of time.     Equipment Recommendations   Other (comment) (TBD)     Recommendations for Other Services   Rehab consult     Precautions/Restrictions   Precautions Precautions: Fall Recall of Precautions/Restrictions: Impaired Restrictions Weight Bearing Restrictions Per Provider Order: No     Mobility Bed Mobility Overal bed mobility: Needs Assistance Bed Mobility: Supine to Sit, Sit to Supine     Supine to sit: Min assist, HOB elevated Sit to supine: Min assist, HOB elevated   General bed mobility comments: Pt min A for safety and physical assistance with verbal cues for sequencing/initiation    Transfers Overall transfer level: Needs assistance Equipment used: 1 person hand held assist Transfers: Sit to/from Stand, Bed to chair/wheelchair/BSC Sit to Stand: Min assist, Mod assist     Step pivot transfers: Min assist, Mod assist     General transfer comment: min-mod A for physical assist due to poor balance/awareness, R side neglect, difficulty following commands and problem solving. Good overall strength to complete tasks.      Balance Overall balance assessment: Needs assistance Sitting-balance support: No upper extremity supported, Feet supported Sitting balance-Leahy Scale: Fair Sitting balance - Comments: able to sit EOB supervision to min A at times, overall able to maintain balance well.   Standing balance support: Single extremity supported, During functional activity, Reliant on assistive device for balance Standing balance-Leahy Scale: Poor Standing balance comment: poor balance, one  person HHA, CGA to mod A at times to maintain balance.  ADL either performed or assessed with clinical judgement   ADL Overall ADL's : Needs assistance/impaired     Grooming: Maximal assistance;Cueing for sequencing;Sitting   Upper Body Bathing: Maximal assistance;Cueing for sequencing;Sitting   Lower Body Bathing: Maximal assistance;Cueing for sequencing;Sitting/lateral leans   Upper Body Dressing : Maximal assistance;Cueing for sequencing;Sitting   Lower Body Dressing: Maximal assistance;Cueing for sequencing;Sitting/lateral leans   Toilet Transfer: Moderate assistance;Cueing for safety;Cueing for sequencing;BSC/3in1   Toileting- Clothing Manipulation and Hygiene: Maximal assistance;Cueing for safety;Cueing for sequencing;Sitting/lateral lean;Sit to/from stand         General ADL Comments: Pt max A for dressing/bathing and feeding due to poor coordination and R side neglect, difficulty sequencing, not aware of deficits, poor balance with transfers.     Vision Baseline Vision/History: 0 No visual deficits Ability to See in Adequate Light: 3 Highly impaired Patient Visual Report: Other (comment) (difficult to assess, R side neglect and vision loss, possible superior/inferior and some L sided vision loss as well.)       Perception         Praxis         Pertinent Vitals/Pain Pain Assessment Pain Assessment: No/denies pain     Extremity/Trunk Assessment Upper Extremity Assessment Upper Extremity Assessment: RUE deficits/detail;LUE deficits/detail RUE Deficits / Details: good strength, poor awareness and coordination due to recent stroke, R side neglect. Significant difficulty holding items or performing functional tasks. Right side more affected RUE Coordination: decreased fine motor;decreased gross motor LUE Deficits / Details: good strength, poor awareness and coordination due to recent stroke, R side neglect. Significant  difficulty holding items or performing functional tasks. Right side more affected LUE Coordination: decreased fine motor;decreased gross motor   Lower Extremity Assessment Lower Extremity Assessment: Defer to PT evaluation       Communication Communication Communication: Impaired Factors Affecting Communication: Difficulty expressing self   Cognition Arousal: Alert Behavior During Therapy: WFL for tasks assessed/performed Cognition: Cognition impaired   Orientation impairments: Time, Place     Attention impairment (select first level of impairment): Focused attention Executive functioning impairment (select all impairments): Sequencing, Reasoning, Problem solving, Organization, Initiation OT - Cognition Comments: Pt has difficulty word finding, R side neglect, difficulty following commands, poor coordination, visual deficits, poor sequencing and problem solving, Oriented to self, situation, knows date but has poor word finding.                 Following commands: Impaired       Cueing  General Comments          Exercises     Shoulder Instructions      Home Living Family/patient expects to be discharged to:: Private residence Living Arrangements: Spouse/significant other Available Help at Discharge: Family;Friend(s);Available PRN/intermittently Type of Home: House Home Access: Stairs to enter [employer] of Steps: 3 on the side and 5in the front one rail on side with brick wall and bil rails in front Entrance Stairs-Rails: Left Home Layout: Two level;Able to live on main level with bedroom/bathroom     Bathroom Shower/Tub: Tub/shower unit   Bathroom Toilet: Handicapped height     Home Equipment: BSC/3in1;Shower seat   Additional Comments: lives with spouse who is visually impaired. Daughter reports she can stay as long as needed      Prior Functioning/Environment Prior Level of Function : Independent/Modified Independent;Driving              Mobility Comments: Pt reports no use of AD prior to hospitalization. ADLs Comments: ind  OT Problem List: Decreased strength;Decreased range of motion;Decreased activity tolerance;Impaired balance (sitting and/or standing);Impaired vision/perception;Decreased coordination;Decreased cognition;Decreased safety awareness;Decreased knowledge of use of DME or AE;Decreased knowledge of precautions;Impaired UE functional use   OT Treatment/Interventions: Self-care/ADL training;Therapeutic exercise;Energy conservation;DME and/or AE instruction;Therapeutic activities;Patient/family education;Balance training;Cognitive remediation/compensation      OT Goals(Current goals can be found in the care plan section)   Acute Rehab OT Goals Patient Stated Goal: to improve balance, return home OT Goal Formulation: With patient/family Time For Goal Achievement: 06/23/23 Potential to Achieve Goals: Good ADL Goals Pt Will Perform Upper Body Dressing: with min assist;sitting Pt Will Perform Lower Body Dressing: with min assist;sitting/lateral leans;sit to/from stand Pt Will Transfer to Toilet: with contact guard assist;bedside commode Pt Will Perform Toileting - Clothing Manipulation and hygiene: with min assist;sitting/lateral leans;sit to/from stand Additional ADL Goal #1: Pt will be able to complete grooming tasks sitting with set up/supervision, verbal cues as needed, using visual scanning to reduce R side neglect   OT Frequency:  Min 2X/week    Co-evaluation PT/OT/SLP Co-Evaluation/Treatment: Yes Reason for Co-Treatment: Complexity of the patient's impairments (multi-system involvement);Necessary to address cognition/behavior during functional activity;For patient/therapist safety;To address functional/ADL transfers   OT goals addressed during session: ADL's and self-care      AM-PAC OT "6 Clicks" Daily Activity     Outcome Measure Help from another person eating meals?: A Lot Help from  another person taking care of personal grooming?: A Lot Help from another person toileting, which includes using toliet, bedpan, or urinal?: A Lot Help from another person bathing (including washing, rinsing, drying)?: A Lot Help from another person to put on and taking off regular upper body clothing?: A Lot Help from another person to put on and taking off regular lower body clothing?: A Lot 6 Click Score: 12   End of Session Equipment Utilized During Treatment: Gait belt Nurse Communication: Mobility status  Activity Tolerance: Patient tolerated treatment well Patient left: in bed;with call bell/phone within reach;with bed alarm set;with nursing/sitter in room;with family/visitor present  OT Visit Diagnosis: Unsteadiness on feet (R26.81);Other abnormalities of gait and mobility (R26.89);Muscle weakness (generalized) (M62.81);Hemiplegia and hemiparesis;Other symptoms and signs involving cognitive function;Other symptoms and signs involving the nervous system (R29.898) Hemiplegia - Right/Left: Right Hemiplegia - dominant/non-dominant: Dominant                Time: 1122-1203 OT Time Calculation (min): 41 min Charges:  OT General Charges $OT Visit: 1 Visit OT Evaluation $OT Eval High Complexity: 1 High OT Treatments $Self Care/Home Management : 23-37 mins  [address], OTR/L   Cherina Dhillon R Ritchard Paragas 06/09/2023, 1:50 PM

## 2023-06-09 NOTE — Progress Notes (Signed)
 PHARMACY - ANTICOAGULATION CONSULT NOTE  Pharmacy Consult for heparin dosing Indication: stroke  Allergies  Allergen Reactions   Codeine Other (See Comments)    Per patient, this made her "flip out"    Patient Measurements: Height: 5\' 5"  (165.1 cm) IBW/kg (Calculated) : 57  Vital Signs: Temp: 97.9 F (36.6 C) (04/03 1200) Temp Source: Oral (04/03 1200) BP: 136/82 (04/03 1200) Pulse Rate: 73 (04/03 1200)  Labs: Recent Labs    06/08/23 1305 06/08/23 1317 06/09/23 0422  HGB 8.0* 7.8* 7.3*  HCT 26.8* 23.0* 23.9*  PLT 236  --  203  APTT 30  --   --   LABPROT 19.5*  --   --   INR 1.6*  --   --   CREATININE 0.84 0.80 0.79    CrCl cannot be calculated (Unknown ideal weight.).   Medical History: Past Medical History:  Diagnosis Date   Anxiety state, unspecified - sees Dr. Jennette Martin in gyn and benzo prescribed there 04/09/2013   Arthritis    Chicken pox    Hx of blood clots    jan 2025   Lupus    MCI (mild cognitive impairment) 05/20/2020   Migraines    Prediabetes        Assessment: Tara Martin is 83 YO female that was admitted 06/08/2023 for CVA. Previously on Eliquis 5 mg po BID for PE (03/09/2023). Anticipate need for biopsy for suspected lung malignancy. Pharmacy consulted for heparin dosing. PTA Eliquis last dose 4/2 0900. This will require aPTT monitoring until aPTT and antiXa are correlating.  Hgb 7, Platelets 200 stable  Goal of Therapy:  Heparin level 0.3-0.5 units/ml aPTT 66-85s Monitor platelets by anticoagulation protocol: Yes   Plan:  Start heparin infusion at 750 units/hr Check anti-Xa level in 8 hours and daily while on heparin Continue to monitor H&H and platelets Check aPTT levels daily while on heparin until correlating with antiXa  Tara Martin 06/09/2023,1:46 PM

## 2023-06-09 NOTE — TOC Benefit Eligibility Note (Signed)
 Pharmacy Patient Advocate Encounter  Insurance verification completed.    The patient is insured through [employer].     Ran test claim for dabigatran 150mg  and the current 30 day co-pay is $55.31 thru [facility].    This test claim was processed through [facility]- copay amounts may vary at other pharmacies due to pharmacy/plan contracts, or as the patient moves through the different stages of their insurance plan.

## 2023-06-09 NOTE — Progress Notes (Signed)
 OT Cancellation Note  Patient Details Name: Tara Martin MRN: [id number] DOB: 10-11-1940   Cancelled Treatment:    Reason Eval/Treat Not Completed: (P) Patient at procedure or test/ unavailable, will check back later.  Alexis Goodell 06/09/2023, 10:49 AM

## 2023-06-10 DIAGNOSIS — I639 Cerebral infarction, unspecified: Secondary | ICD-10-CM | POA: Diagnosis not present

## 2023-06-10 DIAGNOSIS — R911 Solitary pulmonary nodule: Secondary | ICD-10-CM | POA: Diagnosis not present

## 2023-06-10 DIAGNOSIS — I2699 Other pulmonary embolism without acute cor pulmonale: Secondary | ICD-10-CM | POA: Diagnosis not present

## 2023-06-10 LAB — BASIC METABOLIC PANEL WITH GFR
Anion gap: 11 (ref 5–15)
BUN: 10 mg/dL (ref 8–23)
CO2: 19 mmol/L — ABNORMAL LOW (ref 22–32)
Calcium: 8.8 mg/dL — ABNORMAL LOW (ref 8.9–10.3)
Chloride: 107 mmol/L (ref 98–111)
Creatinine, Ser: 0.9 mg/dL (ref 0.44–1.00)
GFR, Estimated: >60 mL/min (ref >60)
Glucose, Bld: 101 mg/dL — ABNORMAL HIGH (ref 70–99)
Potassium: 3.9 mmol/L (ref 3.5–5.1)
Sodium: 137 mmol/L (ref 135–145)

## 2023-06-10 LAB — BPAM RBC
Blood Product Expiration Date: 202504272359
ISSUE DATE / TIME: 202504031815
Unit Type and Rh: 5100

## 2023-06-10 LAB — HEPARIN LEVEL (UNFRACTIONATED): Heparin Unfractionated: 0.49 [IU]/mL (ref 0.30–0.70)

## 2023-06-10 LAB — TYPE AND SCREEN
ABO/RH(D): O POS
Antibody Screen: NEGATIVE
Unit division: 0

## 2023-06-10 LAB — APTT: aPTT: 49 s — ABNORMAL HIGH (ref 24–36)

## 2023-06-10 MED ORDER — ENOXAPARIN SODIUM 60 MG/0.6ML IJ SOSY: 55.0000 mg | PREFILLED_SYRINGE | Freq: Two times a day (BID) | INTRAMUSCULAR | Status: DC

## 2023-06-10 MED ORDER — ALPRAZOLAM 0.5 MG PO TABS
0.5000 mg | ORAL_TABLET | Freq: Every day | ORAL | Status: DC | PRN
  Administered 2023-06-10 – 2023-06-11 (×2): 0.5 mg via ORAL
  Filled 2023-06-10 (×2): qty 1

## 2023-06-10 MED ORDER — DONEPEZIL HCL 10 MG PO TABS
10.0000 mg | ORAL_TABLET | Freq: Every day | ORAL | Status: DC
  Administered 2023-06-10 – 2023-06-13 (×4): 10 mg via ORAL
  Filled 2023-06-10 (×4): qty 1

## 2023-06-10 MED ORDER — ENOXAPARIN SODIUM 60 MG/0.6ML IJ SOSY
55.0000 mg | PREFILLED_SYRINGE | Freq: Two times a day (BID) | INTRAMUSCULAR | Status: DC
  Administered 2023-06-10 – 2023-06-13 (×6): 55 mg via SUBCUTANEOUS
  Filled 2023-06-10 (×6): qty 0.6

## 2023-06-10 MED ORDER — ENSURE ENLIVE PO LIQD
237.0000 mL | Freq: Two times a day (BID) | ORAL | Status: DC
  Administered 2023-06-10 – 2023-06-12 (×5): 237 mL via ORAL

## 2023-06-10 MED ORDER — PANTOPRAZOLE SODIUM 40 MG PO TBEC
40.0000 mg | DELAYED_RELEASE_TABLET | Freq: Every day | ORAL | Status: DC
  Administered 2023-06-10 – 2023-06-13 (×4): 40 mg via ORAL
  Filled 2023-06-10 (×5): qty 1

## 2023-06-10 NOTE — Consult Note (Signed)
 NAME:  Tara Martin, MRN:  [id number], DOB:  February 03, 1941, LOS: 2 ADMISSION DATE:  06/08/2023, CONSULTATION DATE:  06/09/2023 REFERRING MD:  Dr. Randol Kern - [facility] , CHIEF COMPLAINT:  Lung mass    History of Present Illness:  Tara Martin is a 83 year old female with a past medical history significant for recently diagnosed bilateral PEs anticoagulated with Eliquis, lupus, mild cognitive impairment in the setting of late onset Alzheimer's, prediabetes, and anxiety who presented to the ED at [facility] 4/2 for complaints of AMS, aphasia, and weakness.  Head CT obtained on admission revealed acute to subacute infarct in the left occipital lobe and small cortical infarct in the right parietal region in the setting of hypercoagulable state from likely underlying lung cancer.  Patient reports compliance with anticoagulation with Eliquis prior to admission.  Last dose of Eliquis was 4/2 at 9 AM per spouse.  Patient has been established with outpatient pulmonary and is pending lung mass workup with potential tissue sampling.  Given readmission with now new stroke despite anticoagulation pulmonary has been consulted to assist with potential tissue sampling while inpatient.  Pertinent  Medical History  Recently diagnosed bilateral PEs anticoagulated with Eliquis, lupus, mild cognitive impairment in the setting of late onset Alzheimer's, prediabetes, and anxiety  Significant Hospital Events: Including procedures, antibiotic start and stop dates in addition to other pertinent events   4/2 presented with altered mental status, aphasia, and weakness workup revealed new CVA, admitted per hospitalist 4/3 pulmonary consulted for assistance in working up lung mass  Interim History / Subjective:   Spoke with patient's husband Leonette Most via phone while with patient at bedside Reviewed CT chest findings of enlarging right lung mass Offered bronchoscopy procedure for biopsy on Tuesday 4/8. Reviewed risks of procedure.  They agreed to move forward with procedure to identify this mass if able.  Objective   Blood pressure 136/62, pulse 68, temperature 97.6 F (36.4 C), temperature source Oral, resp. rate (!) 22, height 5\' 5"  (1.651 m), SpO2 95%.        Intake/Output Summary (Last 24 hours) at 06/10/2023 1047 Last data filed at 06/10/2023 0900 Gross per 24 hour  Intake 578.05 ml  Output 400 ml  Net 178.05 ml   There were no vitals filed for this visit.  Examination: General: chronically ill appearing thin elderly female lying in bed, in NAD HEENT: Pala/ATT, MM pink/moist, PERRL,  Neuro: Alert and oriented x3, non-focal  CV: s1s2 regular rate and rhythm, no murmur, rubs, or gallops,  PULM:  Clear to auscultation, no increased work of breathing, on RA  GI: soft, bowel sounds active in all 4 quadrants, non-tender, non-distended, tolerating oral diet Extremities: warm/dry, no edema  Skin: no rashes or lesions  Resolved Hospital Problem list     Assessment & Plan:  Pulmonary nodule -2.5 cm somewhat spiculated mass to right middle lobe adjacent to cardiac border and mediastinal lymph node measuring 10 mm for seen on CTA chest 03/09/2023 which has enlarged to 2.5cm x 4cm on recent CT Chest -Last dose of Eliquis per spouse was 4/2 at 0900 Bilateral pulmonary emboli -Seen on CTA chest 03/09/2023 started on Eliquis P: Continue lovenox twice daily, hold morning of 4/8 for bronchoscopy Plan for Bronchoscopy on 4/8 with Dr. Delton Coombes. Procedure request placed. Make NPO Monday night 4/7 for procedure  Acute multifocal infarcts acute to subacute despite anticoagulation -MRI brain with multifocal acute ischemia including the left occipital lobe, left temporal lobe, left thalamus and bilateral parietal subcortical  white matter with petechial hemorrhage P: Neurology following, appreciate assistance Eliquis remains on hold per neurology Aspirin only  PCCM will see again 4/7 prior to procedure  Best Practice (right click  and "Reselect all SmartList Selections" daily)  Per primary   Labs   CBC: Recent Labs  Lab 06/08/23 1305 06/08/23 1317 06/09/23 0422 06/10/23 0410  WBC 5.8  --  5.0 4.4  NEUTROABS 4.0  --   --   --   HGB 8.0* 7.8* 7.3* 9.3*  HCT 26.8* 23.0* 23.9* 28.6*  MCV 81.5  --  79.9* 80.3  PLT 236  --  203 220    Basic Metabolic Panel: Recent Labs  Lab 06/08/23 1305 06/08/23 1317 06/09/23 0422 06/10/23 0410  NA 135 138 135 137  K 3.3* 3.5 3.6 3.9  CL 104 107 106 107  CO2 20*  --  19* 19*  GLUCOSE 105* 99 90 101*  BUN 16 13 11 10   CREATININE 0.84 0.80 0.79 0.90  CALCIUM 9.2  --  8.6* 8.8*  MG 2.1  --   --   --   PHOS 3.7  --   --   --    GFR: CrCl cannot be calculated (Unknown ideal weight.). Recent Labs  Lab 06/08/23 1305 06/09/23 0422 06/10/23 0410  WBC 5.8 5.0 4.4    Liver Function Tests: Recent Labs  Lab 06/08/23 1305 06/09/23 0422  AST 22 19  ALT 11 10  ALKPHOS 75 65  BILITOT 0.6 0.3  PROT 7.8 6.2*  ALBUMIN 3.7 2.9*   No results for input(s): "LIPASE", "AMYLASE" in the last 168 hours. No results for input(s): "AMMONIA" in the last 168 hours.  ABG    Component Value Date/Time   HCO3 18.0 (L) 03/09/2023 2005   TCO2 20 (L) 06/08/2023 1317   ACIDBASEDEF 5.0 (H) 03/09/2023 2005   O2SAT 90 03/09/2023 2005     Coagulation Profile: Recent Labs  Lab 06/08/23 1305  INR 1.6*    Cardiac Enzymes: No results for input(s): "CKTOTAL", "CKMB", "CKMBINDEX", "TROPONINI" in the last 168 hours.  HbA1C: Hemoglobin A1C  Date/Time Value Ref Range Status  08/09/2022 08:52 AM 5.9 (A) 4.0 - 5.6 % Final   Hgb A1c MFr Bld  Date/Time Value Ref Range Status  06/08/2023 01:05 PM 5.5 4.8 - 5.6 % Final    Comment:    (NOTE) Pre diabetes:          5.7%-6.4%  Diabetes:              >6.4%  Glycemic control for   <7.0% adults with diabetes   05/12/2020 02:47 PM 6.1 4.6 - 6.5 % Final    Comment:    Glycemic Control Guidelines for People with Diabetes:Non  Diabetic:  <6%Goal of Therapy: <7%Additional Action Suggested:  >8%     CBG: Recent Labs  Lab 06/08/23 1246 06/08/23 2109  GLUCAP 90 89     Critical care time: NA   Melody Comas, MD [facility] Office: [phone]   See Amion for personal pager PCCM on call pager [phone] until 7pm. Please call Elink 7p-7a. [phone]

## 2023-06-10 NOTE — Progress Notes (Signed)
 STROKE TEAM PROGRESS NOTE   SUBJECTIVE (INTERVAL HISTORY) Her husband is at the bedside.  Overall her condition is stable.  Patient still pleasantly demented, speaking incoherently, distractible, impaired attention and concentration.  Seem to have right neglect intermittently.   OBJECTIVE Temp:  [97.6 F (36.4 C)-98.8 F (37.1 C)] 97.9 F (36.6 C) (04/04 1146) Pulse Rate:  [63-202] 68 (04/04 0800) Cardiac Rhythm: Normal sinus rhythm (04/04 0821) Resp:  [13-22] 19 (04/04 1146) BP: (127-160)/(52-87) 129/52 (04/04 1146) SpO2:  [87 %-100 %] 99 % (04/04 1146) Weight:  [56.9 kg] 56.9 kg (04/04 1309)  Recent Labs  Lab 06/08/23 1246 06/08/23 2109  GLUCAP 90 89   Recent Labs  Lab 06/08/23 1305 06/08/23 1317 06/09/23 0422 06/10/23 0410  NA 135 138 135 137  K 3.3* 3.5 3.6 3.9  CL 104 107 106 107  CO2 20*  --  19* 19*  GLUCOSE 105* 99 90 101*  BUN 16 13 11 10   CREATININE 0.84 0.80 0.79 0.90  CALCIUM 9.2  --  8.6* 8.8*  MG 2.1  --   --   --   PHOS 3.7  --   --   --    Recent Labs  Lab 06/08/23 1305 06/09/23 0422  AST 22 19  ALT 11 10  ALKPHOS 75 65  BILITOT 0.6 0.3  PROT 7.8 6.2*  ALBUMIN 3.7 2.9*   Recent Labs  Lab 06/08/23 1305 06/08/23 1317 06/09/23 0422 06/10/23 0410  WBC 5.8  --  5.0 4.4  NEUTROABS 4.0  --   --   --   HGB 8.0* 7.8* 7.3* 9.3*  HCT 26.8* 23.0* 23.9* 28.6*  MCV 81.5  --  79.9* 80.3  PLT 236  --  203 220   No results for input(s): "CKTOTAL", "CKMB", "CKMBINDEX", "TROPONINI" in the last 168 hours. Recent Labs    06/08/23 1305  LABPROT 19.5*  INR 1.6*   Recent Labs    06/08/23 1658  COLORURINE YELLOW  LABSPEC 1.017  PHURINE 6.0  GLUCOSEU NEGATIVE  HGBUR NEGATIVE  BILIRUBINUR NEGATIVE  KETONESUR NEGATIVE  PROTEINUR 30*  NITRITE POSITIVE*  LEUKOCYTESUR TRACE*       Component Value Date/Time   CHOL 136 06/09/2023 0422   TRIG 155 (H) 06/09/2023 0422   HDL 23 (L) 06/09/2023 0422   CHOLHDL 5.9 06/09/2023 0422   VLDL 31  06/09/2023 0422   LDLCALC 82 06/09/2023 0422   LDLCALC 129 (H) 12/11/2019 0908   Lab Results  Component Value Date   HGBA1C 5.5 06/08/2023      Component Value Date/Time   LABOPIA NONE DETECTED 06/08/2023 1658   COCAINSCRNUR NONE DETECTED 06/08/2023 1658   LABBENZ NONE DETECTED 06/08/2023 1658   AMPHETMU NONE DETECTED 06/08/2023 1658   THCU NONE DETECTED 06/08/2023 1658   LABBARB NONE DETECTED 06/08/2023 1658    Recent Labs  Lab 06/08/23 1305  ETH <10    I have personally reviewed the radiological images below and agree with the radiology interpretations.  CT CHEST WO CONTRAST Result Date: 06/09/2023 CLINICAL DATA:  Pleural mass EXAM: CT CHEST WITHOUT CONTRAST TECHNIQUE: Multidetector CT imaging of the chest was performed following the standard protocol without IV contrast. RADIATION DOSE REDUCTION: This exam was performed according to the departmental dose-optimization program which includes automated exposure control, adjustment of the mA and/or kV according to patient size and/or use of iterative reconstruction technique. COMPARISON:  03/09/2023 FINDINGS: Cardiovascular: Heart is normal size. Aorta is normal caliber. Small pericardial effusion. Scattered coronary artery  and aortic calcifications. Mediastinum/Nodes: Mediastinal adenopathy is stable since prior study. Difficult to assess the hila right without IV contrast, but right hilar fullness is suspicious for adenopathy. No axillary adenopathy. Trachea and esophagus are unremarkable. Thyroid unremarkable. Lungs/Pleura: Medial right middle lobe mass again noted measuring 4.0 x 2.2 cm compared to 2.6 x 1.3 cm on prior study when measured at the same level and in the same planes. This is highly suspicious for primary lung cancer. Anterior right middle lobe nodule measures 8 mm compared to 7 mm previously. Dependent atelectasis or scarring. No effusions. Upper Abdomen: No acute findings Musculoskeletal: Chest wall soft tissues are  unremarkable. No acute bony abnormality. IMPRESSION: Medial right middle lobe mass has enlarged since prior study, now measuring approximately 4.0 x 2.2 cm compared to 2.6 x 1.3 cm previously. This is most compatible with primary lung cancer. This could be further evaluated with PET CT. Stable mediastinal adenopathy. Difficult to assess the hila without IV contrast, but right hilar fullness is suspicious for adenopathy. Bibasilar atelectasis or scarring. Small pericardial effusion. Aortic Atherosclerosis (ICD10-I70.0). Electronically Signed   By: Charlett Nose M.D.   On: 06/09/2023 19:34   ECHOCARDIOGRAM COMPLETE BUBBLE STUDY Result Date: 06/09/2023    ECHOCARDIOGRAM REPORT   Patient Name:   Tara Martin Date of Exam: 06/09/2023 Medical Rec #:  [id number]        Height:       65.0 in Accession #:    [id number]       Weight:       135.0 lb Date of Birth:  [birth year]        BSA:          1.674 m Patient Age:    83 years         BP:           158/83 mmHg Patient Gender: F                HR:           63 bpm. Exam Location:  Inpatient Procedure: 2D Echo, Color Doppler, Cardiac Doppler and Saline Contrast Bubble            Study (Both Spectral and Color Flow Doppler were utilized during            procedure). Indications:    Stroke 434.91 / I63.9  History:        Patient has prior history of Echocardiogram examinations, most                 recent 03/10/2023.  Sonographer:    Harriette Bouillon RDCS Referring Phys: [phone] DAVID MANUEL ORTIZ IMPRESSIONS  1. Left ventricular ejection fraction, by estimation, is 60 to 65%. Left ventricular ejection fraction by PLAX is 64 %. The left ventricle has normal function. The left ventricle has no regional wall motion abnormalities. Left ventricular diastolic parameters are consistent with Grade I diastolic dysfunction (impaired relaxation).  2. Right ventricular systolic function is normal. The right ventricular size is normal.  3. There is no evidence of cardiac tamponade.  4. The  mitral valve is abnormal. Mild mitral valve regurgitation.  5. The aortic valve is tricuspid. Aortic valve regurgitation is not visualized.  6. The inferior vena cava is normal in size with greater than 50% respiratory variability, suggesting right atrial pressure of 3 mmHg.  7. Doppler suspicious of left to right atrial level shunting by color flow Doppler. However, agitated saline contrast bubble study was negative,  with no evidence of right to left interatrial shunt. There is a likely a patent foramen ovale with predominantly left to right shunting across the atrial septum. Comparison(s): Changes from prior study are noted. 03/10/2023: LVEF 60-65%. Conclusion(s)/Recommendation(s): Findings suggest possible PFO with left to right shunting, however, no right to left shunting seen by saline microbubble contrast - even with valsalva. An area of bubble "[place]" was noted at the atrial septum, which could be due to brisk left to right flow. Consider further evaluation with transesophageal echocardiogram and bubble study which would provide more definitive images. FINDINGS  Left Ventricle: Left ventricular ejection fraction, by estimation, is 60 to 65%. Left ventricular ejection fraction by PLAX is 64 %. The left ventricle has normal function. The left ventricle has no regional wall motion abnormalities. The left ventricular internal cavity size was normal in size. There is no left ventricular hypertrophy. Left ventricular diastolic parameters are consistent with Grade I diastolic dysfunction (impaired relaxation). Indeterminate filling pressures. Right Ventricle: The right ventricular size is normal. No increase in right ventricular wall thickness. Right ventricular systolic function is normal. Left Atrium: Left atrial size was normal in size. Right Atrium: Right atrial size was normal in size. Prominent Chiari network. Pericardium: Trivial pericardial effusion is present. The pericardial effusion is circumferential.  There is no evidence of cardiac tamponade. Mitral Valve: The mitral valve is abnormal. Mild mitral valve regurgitation. Tricuspid Valve: The tricuspid valve is grossly normal. Tricuspid valve regurgitation is trivial. Aortic Valve: The aortic valve is tricuspid. Aortic valve regurgitation is not visualized. Pulmonic Valve: The pulmonic valve was normal in structure. Pulmonic valve regurgitation is not visualized. Aorta: The aortic root and ascending aorta are structurally normal, with no evidence of dilitation. Venous: The inferior vena cava is normal in size with greater than 50% respiratory variability, suggesting right atrial pressure of 3 mmHg. IAS/Shunts: The interatrial septum is aneurysmal. Evidence of atrial level shunting detected by color flow Doppler. Agitated saline contrast was given intravenously to evaluate for intracardiac shunting. Agitated saline contrast bubble study was negative, with no evidence of any interatrial shunt. A moderately sized patent foramen ovale is detected with predominantly left to right shunting across the atrial septum.  LEFT VENTRICLE PLAX 2D LV EF:         Left            Diastology                ventricular     LV e' medial:    5.98 cm/s                ejection        LV E/e' medial:  11.5                fraction by     LV e' lateral:   9.03 cm/s                PLAX is 64      LV E/e' lateral: 7.6                %. LVIDd:         4.10 cm LVIDs:         2.70 cm LV PW:         1.00 cm LV IVS:        1.00 cm LVOT diam:     2.00 cm LV SV:         65 LV SV Index:  39 LVOT Area:     3.14 cm  RIGHT VENTRICLE             IVC RV S prime:     19.90 cm/s  IVC diam: 1.40 cm TAPSE (M-mode): 1.7 cm LEFT ATRIUM           Index        RIGHT ATRIUM           Index LA diam:      3.60 cm 2.15 cm/m   RA Area:     10.10 cm LA Vol (A2C): 38.0 ml 22.70 ml/m  RA Volume:   19.80 ml  11.83 ml/m LA Vol (A4C): 34.8 ml 20.79 ml/m  AORTIC VALVE LVOT Vmax:   86.20 cm/s LVOT Vmean:  59.300 cm/s  LVOT VTI:    0.207 m  AORTA Ao Root diam: 3.10 cm Ao Asc diam:  3.20 cm MITRAL VALVE MV Area (PHT): 2.35 cm    SHUNTS MV Decel Time: 323 msec    Systemic VTI:  0.21 m MV E velocity: 68.70 cm/s  Systemic Diam: 2.00 cm MV A velocity: 87.50 cm/s MV E/A ratio:  0.79 Zoila Shutter MD Electronically signed by Zoila Shutter MD Signature Date/Time: 06/09/2023/4:47:04 PM    Final    VAS US CAROTID Result Date: 06/09/2023 Carotid Arterial Duplex Study Patient Name:  ERIE SICA Edelstein  Date of Exam:   06/09/2023 Medical Rec #: [id number]         Accession #:    [id number] Date of Birth: April 23, 1940         Patient Gender: F Patient Age:   59 years Exam Location:  [facility] Procedure:      VAS US CAROTID Referring Phys: DAVID ORTIZ --------------------------------------------------------------------------------  Indications:       CVA, Speech disturbance, Numbness and Weakness. Risk Factors:      Hyperlipidemia, no history of smoking. Other Factors:     Late onset Alzheimer's, Lupus, right middle lobe lung mass,                    question malignancy. Comparison Study:  No prior study Performing Technologist: Sherren Kerns RVS  Examination Guidelines: A complete evaluation includes B-mode imaging, spectral Doppler, color Doppler, and power Doppler as needed of all accessible portions of each vessel. Bilateral testing is considered an integral part of a complete examination. Limited examinations for reoccurring indications may be performed as noted.  Right Carotid Findings: +----------+--------+--------+--------+------------------+--------+           PSV cm/sEDV cm/sStenosisPlaque DescriptionComments +----------+--------+--------+--------+------------------+--------+ CCA Prox  98      19                                         +----------+--------+--------+--------+------------------+--------+ CCA Distal78      16                                          +----------+--------+--------+--------+------------------+--------+ ICA Prox  75      27              heterogenous               +----------+--------+--------+--------+------------------+--------+ ICA Mid   82      27                                         +----------+--------+--------+--------+------------------+--------+  ICA Distal130     33                                tortuous +----------+--------+--------+--------+------------------+--------+ ECA       77      9                                          +----------+--------+--------+--------+------------------+--------+ +----------+--------+-------+--------+-------------------+           PSV cm/sEDV cmsDescribeArm Pressure (mmHG) +----------+--------+-------+--------+-------------------+ Subclavian101                                        +----------+--------+-------+--------+-------------------+ +---------+--------+--+--------+--+ VertebralPSV cm/s60EDV cm/s17 +---------+--------+--+--------+--+  Left Carotid Findings: +----------+--------+--------+--------+------------------+--------+           PSV cm/sEDV cm/sStenosisPlaque DescriptionComments +----------+--------+--------+--------+------------------+--------+ CCA Prox  84      16                                         +----------+--------+--------+--------+------------------+--------+ CCA Distal70      17                                         +----------+--------+--------+--------+------------------+--------+ ICA Prox  38      10              heterogenous               +----------+--------+--------+--------+------------------+--------+ ICA Mid   89      26                                         +----------+--------+--------+--------+------------------+--------+ ICA Distal79      25                                         +----------+--------+--------+--------+------------------+--------+ ECA       114     11                                          +----------+--------+--------+--------+------------------+--------+ +----------+--------+--------+--------+-------------------+           PSV cm/sEDV cm/sDescribeArm Pressure (mmHG) +----------+--------+--------+--------+-------------------+ [id number]                                          +----------+--------+--------+--------+-------------------+ +---------+--------+--+--------+--+ VertebralPSV cm/s41EDV cm/s15 +---------+--------+--+--------+--+   Summary: Right Carotid: The extracranial vessels were near-normal with only minimal wall                thickening or plaque. Left Carotid: The extracranial vessels were near-normal with only minimal wall               thickening or plaque. Vertebrals:  Bilateral vertebral arteries demonstrate antegrade flow. Subclavians: Normal flow hemodynamics were seen in bilateral subclavian              arteries. *See table(s) above for measurements and observations.     Preliminary    CT ANGIO HEAD NECK W WO CM Result Date: 06/09/2023 CLINICAL DATA:  83 year old female neurologic deficit. Multifocal infarcts in the anterior and posterior circulation on MRI yesterday. EXAM: CT ANGIOGRAPHY HEAD AND NECK WITH AND WITHOUT CONTRAST TECHNIQUE: Multidetector CT imaging of the head and neck was performed using the standard protocol during bolus administration of intravenous contrast. Multiplanar CT image reconstructions and MIPs were obtained to evaluate the vascular anatomy. Carotid stenosis measurements (when applicable) are obtained utilizing NASCET criteria, using the distal internal carotid diameter as the denominator. RADIATION DOSE REDUCTION: This exam was performed according to the departmental dose-optimization program which includes automated exposure control, adjustment of the mA and/or kV according to patient size and/or use of iterative reconstruction technique. CONTRAST:  60mL OMNIPAQUE IOHEXOL 350 MG/ML SOLN  COMPARISON:  Brain MRI and head CT yesterday. CTA chest 03/09/2023. FINDINGS: CT HEAD Brain: Hypodense cytotoxic edema most pronounced in the left PCA territory, stable there from the CT yesterday. Increased visualization of cytotoxic edema in the left thalamus since yesterday (series 5, image 14). Other small areas of abnormal diffusion on MRI remain occult by CT. No acute intracranial hemorrhage identified. No acute intracranial hemorrhage identified. Stable ventricle size and configuration. Normal basilar cisterns. Calvarium and skull base: Intact, stable. Paranasal sinuses: Visualized paranasal sinuses and mastoids are stable and well aerated. Orbits: No acute orbit or scalp soft tissue finding. CTA NECK Skeleton: Cervical spine degeneration. No acute osseous abnormality identified. Upper chest: Paraseptal emphysema or scattered pneumatocele is. Abnormal bulky superior mediastinal lymphadenopathy, appears malignant (series 10, image 168) and progressed since January. Suspected primary right middle lobe lung mass at that time not included today. Other neck: Thoracic inlet metastatic lymphadenopathy, bilateral lower neck level 4 stations is progressed since January (series 10, image 141). No other metastatic disease identified in the neck. Aortic arch: 3 vessel arch.  No significant arch atherosclerosis. Right carotid system: Tortuosity. Patent with no significant plaque or stenosis through the right carotid bifurcation. 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 The saccular component is also roughly 8 mm. No associated stenosis. Left carotid system: Tortuous left CCA and patent left carotid bifurcation without stenosis. However, attenuated right ICA distal to the bulb, circumferentially narrow due to 2-3 mm diameter. This remains patent to the skull base, with a  small area of fusiform aneurysmal dilatation measuring 5-6 mm just below the skull base series 17, image 30. Vertebral arteries: Proximal subclavian arteries, cervical vertebral arteries are patent with tortuosity, no significant plaque or stenosis to the skull base. CTA HEAD Posterior circulation: Right vertebral artery is mildly dominant throughout. Patent distal vertebral arteries and vertebrobasilar junction with tortuosity but no significant plaque or stenosis. Patent PICA origins. Patent basilar artery without stenosis. Patent SCA and left PCA origins. Fetal type right PCA origin. Prominent left posterior communicating artery also, which in light of the below left ICA siphon findings is probably collateral supply to the left ICA terminus. Left PCA is severely irregular and attenuated beginning at the junction of the P1 and posterior communicating artery, series 15, image 20, and  P3 branches are poorly enhancing. Contralateral right PCA branches appear more normal, mildly irregular. Anterior circulation:  Both ICA siphons are patent. But the left petrous ICA is highly irregular and stenotic as seen on series 12, image 126, with probable soft plaque (series 14, image 152). Left ICA remains patent, appears more normal in the cavernous and supraclinoid segments. Contralateral right ICA siphon is ectatic throughout, patent without stenosis. Both posterior communicating artery origins are normal. Patent carotid termini, MCA and ACA origins. Left ACA A1 is dominant and mildly irregular. Anterior communicating artery is diminutive but patent. Bilateral ACA branches are patent with mild irregularity. Right MCA M1 segment and bifurcation are patent without stenosis. Left MCA M1 segment and bifurcation are patent without stenosis. Bilateral MCA branches are patent with mild irregularity. Venous sinuses: Patent. Anatomic variants: Dominant right vertebral artery, right ACA A1, fetal type right PCA origin. Review of the  MIP images confirms the above findings IMPRESSION: 1. Progressed since January and bulky Metastatic Lymphadenopathy in the visible mediastinum, bilateral lower neck/thoracic inlet. 2. Positive for Left PCA severe stenosis, ultimately occluded in the P3 segments, concordant with acute ischemia demonstrated by MRI. 3. Positive also for highly abnormal bilateral Internal Carotid Arteries: - Attenuated, Stenotic cervical Left ICA and Left siphon, including High-grade stenosis in the petrous segment. Evidence of collateral supply to the Left ICA terminus from the posterior communicating artery, posterior circulation. 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 4. No other hemodynamically significant arterial stenosis in the head or neck. 5. Expected CT appearance of ischemia demonstrated by MRI yesterday. No hemorrhagic transformation or intracranial mass effect. Electronically Signed   By: Odessa Fleming M.D.   On: 06/09/2023 09:21   MR BRAIN WO CONTRAST Result Date: 06/08/2023 CLINICAL DATA:  Acute neurologic deficit EXAM: MRI HEAD WITHOUT CONTRAST TECHNIQUE: Multiplanar, multiecho pulse sequences of the brain and surrounding structures were obtained without intravenous contrast. COMPARISON:  None Available. FINDINGS: Brain: Multifocal abnormal diffusion restriction, including the left occipital lobe, left temporal lobe, left thalamus and within the bilateral parietal subcortical white matter. Petechial hemorrhage within the posterior right hemisphere. There is multifocal hyperintense T2-weighted signal within the white matter. Generalized volume loss. The midline structures are normal. Vascular: Normal flow voids. Skull and upper cervical spine: Normal calvarium and skull base. Visualized upper cervical spine and soft tissues are normal. Sinuses/Orbits:No paranasal sinus fluid levels or advanced  mucosal thickening. No mastoid or middle ear effusion. Normal orbits. IMPRESSION: 1. Multifocal acute ischemia, including the left occipital lobe, left temporal lobe, left thalamus and within the bilateral parietal subcortical white matter. No hemorrhage or mass effect. 2. Petechial hemorrhage within the posterior right hemisphere. Electronically Signed   By: Deatra Robinson M.D.   On: 06/08/2023 20:06   CT Head Wo Contrast Addendum Date: 06/08/2023 ADDENDUM REPORT: 06/08/2023 16:43 ADDENDUM: These results were called by telephone at the time of interpretation on 06/08/2023 at 4:37 pm to provider Dr. Rodena Medin, who verbally acknowledged these results. Electronically Signed   By: Darliss Cheney M.D.   On: 06/08/2023 16:43   Result Date: 06/08/2023 CLINICAL DATA:  Mental status change EXAM: CT HEAD WITHOUT CONTRAST TECHNIQUE: Contiguous axial images were obtained from the base of the skull through the vertex without intravenous contrast. RADIATION DOSE REDUCTION: This exam was performed according to the departmental dose-optimization program which includes automated exposure control, adjustment of the mA and/or kV  according to patient size and/or use of iterative reconstruction technique. COMPARISON:  MRI brain 02/24/2021.  Head CT 02/02/2020. FINDINGS: Brain: Focal area of hypodensity with loss of gray-white matter distinction is seen in the left occipital lobe, new from prior. No significant mass effect or midline shift, but there is associated sulcal effacement. No acute intracranial hemorrhage or hydrocephalus. There is mild diffuse atrophy. There is a small old cortical infarct in the high right parietal region which is new from 2021. Vascular: No hyperdense vessel or unexpected calcification. Skull: Normal. Negative for fracture or focal lesion. Sinuses/Orbits: No acute finding. Other: None. IMPRESSION: 1. Acute to subacute infarct in the left occipital lobe, new from prior. No significant mass effect or midline  shift. No acute hemorrhage. 2. Small old cortical infarct in the high right parietal region, new from 2021. Electronically Signed: By: Darliss Cheney M.D. On: 06/08/2023 16:32   CT Cervical Spine Wo Contrast Result Date: 06/08/2023 CLINICAL DATA:  Trauma EXAM: CT CERVICAL SPINE WITHOUT CONTRAST TECHNIQUE: Multidetector CT imaging of the cervical spine was performed without intravenous contrast. Multiplanar CT image reconstructions were also generated. RADIATION DOSE REDUCTION: This exam was performed according to the departmental dose-optimization program which includes automated exposure control, adjustment of the mA and/or kV according to patient size and/or use of iterative reconstruction technique. COMPARISON:  Cervical spine CT 02/02/2020 FINDINGS: Alignment: There is 2 mm of anterolisthesis at C7-T1 which is favored is degenerative and unchanged. Alignment is otherwise anatomic. Skull base and vertebrae: No acute fracture. No primary bone lesion or focal pathologic process. Soft tissues and spinal canal: No prevertebral fluid or swelling. No visible canal hematoma. Disc levels: There is moderate severe disc space narrowing and endplate osteophyte formation at C4-C5, C5-C6 and C6-C7 compatible with degenerative change. There is diffuse bilateral facet arthropathy causing mild neural foraminal stenosis most significant at C3-C4, C4-C5 and C5-C6 on right. Posterior disc osteophyte complexes are seen at C4-C5, C5-C6 and C6-C7 causing mild central canal stenosis. Upper chest: Negative. Other: None. IMPRESSION: 1. No acute fracture or traumatic subluxation of the cervical spine. 2. Multilevel degenerative changes of the cervical spine. Electronically Signed   By: Darliss Cheney M.D.   On: 06/08/2023 16:36     PHYSICAL EXAM  Temp:  [97.6 F (36.4 C)-98.8 F (37.1 C)] 97.9 F (36.6 C) (04/04 1146) Pulse Rate:  [63-202] 68 (04/04 0800) Resp:  [13-22] 19 (04/04 1146) BP: (127-160)/(52-87) 129/52 (04/04  1146) SpO2:  [87 %-100 %] 99 % (04/04 1146) Weight:  [56.9 kg] 56.9 kg (04/04 1309)  General - Well nourished, well developed, in no apparent distress. Seems happy and pleasant   Ophthalmologic - fundi not visualized due to noncooperation.  Cardiovascular - Regular rhythm and rate.  Neuro - awake, alert, eyes open, orientated to self and people, not continue to age, time or situation. Fluent language output, but incoherent with intermittent word salad.  Following most simple commands with prompt and repetitive request.  Mild to moderate perseveration.  Able to name 1/3 and repeat well. No gaze palsy, tracking more on the left, left tracking to the right, seems to have right visual neglect, not blinking to visual threat bilaterally. No facial droop. Tongue midline. Bilateral UEs 5/5, no drift. Bilaterally LEs 5/5, no drift.  However needed repetitive request to have right upper and lower extremity movement.  Sensation symmetrical bilaterally subjectively, b/l no ataxia by observation however difficult to follow commands for finger-to-nose testing, gait not tested.    ASSESSMENT/PLAN Tara Martin is a 83 y.o. 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 No TNK given due to outside window.    Stroke: Multifocal infarcts, embolic pattern, concerning for cardioembolic source versus hypercarbia state from advanced malignancy CT left occipital infarct CTA head and neck left PCA severe stenosis, occluded at left P3, Attenuated, Stenotic cervical Left ICA and Left siphon, including High-grade stenosis in the petrous segment. Evidence of collateral supply to the Left ICA terminus from the posterior communicating artery, posterior circulation. 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 MRI  Multifocal acute ischemia, including the left occipital lobe, left temporal lobe, left thalamus and within the bilateral parietal subcortical white matter. Petechial hemorrhage within the posterior right hemisphere. 2D Echo EF 60 to 65% Carotid Doppler negative LDL 82 HgbA1c 5.5 UDS negative Heparin IV for VTE prophylaxis Eliquis (apixaban) daily prior to admission, now on heparin IV, will transition to lovenox therapeutic dose with holding a day before lung biopsy. Ongoing aggressive stroke risk factor management Therapy recommendations: CIR Disposition: Pending  Probable advanced malignancy Hypercarbia state Known right middle lobe mass, pending biopsy CT head and neck showed progressed bulky metastatic lymphadenopathy in the visible mediastinum, bilateral lower neck/thoracic inlet. 03/2023 bilateral DVT and PE Pulmonary on board for biopsy next week On heparin IV per stroke protocol, will transition to lovenox therapeutic dose with holding a day before lung biopsy.  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 Follows with [facility] Dr. Vickey Huger  Other Stroke Risk Factors Advanced age History of PE and bilateral DVT in 03/2023, on Eliquis  Other Active Problems Anemia of chronic disease, hemoglobin 7.3-> 9.3 UA WBC 11-20  Hospital day # 2  Neurology will sign off. Please call with questions. Pt will follow up with Dr. Vickey Huger at [facility] in about 4-6 weeks. Thanks for the consult.   Marvel Plan, MD PhD Stroke  Neurology 06/10/2023 2:01 PM    To contact Stroke Continuity provider, please refer to [email or web address]. After hours, contact General Neurology

## 2023-06-10 NOTE — Progress Notes (Signed)
 PROGRESS NOTE    [name]  ZOX:[id number] DOB: 09/26/1940 DOA: 06/08/2023 PCP: Kristian Covey, MD    Chief Complaint  Patient presents with   Weakness   Aphasia   Altered Mental Status    Brief Narrative:    Tara Martin is a 83 y.o. female with medical history significant of anxiety, osteoarthritis, varicella-zoster, systemic lupus erythematosus, mild cognitive impairment, late onset Alzheimer's, migraine headaches, prediabetes who was admitted at the beginning of the year with pulmonary embolism and diagnosed with right middle lobe mass who is presenting to the emergency department via EMS with progressively worse mental status associated with right-sided weakness, confusion and unsteady gait, her workup significant for acute CVA, admitted for further workup   Assessment & Plan:   Principal Problem:   Acute CVA (cerebrovascular accident) (HCC) Active Problems:   SLE (systemic lupus erythematosus related syndrome) (HCC)   Prediabetes   Pulmonary embolism (HCC)   Moderate late onset Alzheimer's dementia without behavioral disturbance, psychotic disturbance, mood disturbance, or anxiety (HCC)   Normocytic anemia   Acute CVA (cerebrovascular accident) (HCC) -Patient MRI significant for acute CVA, most likely of cardioembolic source, it is concerning that it happened while on Eliquis, which may indicate hypercoagulable status  (please see discussion below under pulmonary mass) -PT, OT, SLP consulted, recommendation for CIR -started on heparin GTT, stroke protocol, will transition to full dose subcu Lovenox as discussed with both pulmonary and neurology.   Moderate late onset Alzheimer's dementia   without behavioral disturbance,  psychotic disturbance, mood disturbance, or anxiety (HCC) Continue donezepil 10 mg p.o. at bedtime. Supportive care.   SLE (systemic lupus erythematosus related syndrome) (HCC) Does not seem to be active. She is not currently on  medications.    Prediabetes A1c is 5.5  Pulmonary mass - PCCM consulted, likely for navigational (Koska P on Tuesday, okay for full dose Lovenox for now   Pulmonary embolism [facility]) The patient has been on apixaban twice daily.  Heparin GTT>> full dose Lovenox   Normocytic anemia Low B12 level -Hemoglobin 7.3, transfused 1 unit, good response 9.3, workup significant for B12 of 273, ferritin borderline low at 31 but will hold on IV iron for now.    DVT prophylaxis: Heparin GTT Code Status: (Full) Family Communication: (Discussed with husband at bedside 4/3,) none at bedside today Disposition:   Status is: Inpatient    Consultants:  Neurology [facility]  Subjective: No significant events overnight, she denies any complaints  Objective: Vitals:   06/10/23 0000 06/10/23 0505 06/10/23 0800 06/10/23 1146  BP: 138/62 (!) 140/68 136/62 (!) 129/52  Pulse: 64 (!) 202 68   Resp: 13 19 (!) 22 19  Temp:  98.7 F (37.1 C) 97.6 F (36.4 C) 97.9 F (36.6 C)  TempSrc:  Oral Oral Oral  SpO2: 97% (!) 87% 95% 99%  Height:        Intake/Output Summary (Last 24 hours) at 06/10/2023 1252 Last data filed at 06/10/2023 0900 Gross per 24 hour  Intake 578.05 ml  Output 400 ml  Net 178.05 ml   There were no vitals filed for this visit.  Examination:  Awake Alert, Oriented X 2, she has mild dementia at baseline, but overall present Symmetrical Chest wall movement, Good air movement bilaterally, CTAB RRR,No Gallops,Rubs or new Murmurs, No Parasternal Heave +ve B.Sounds, Abd Soft, No tenderness, No rebound - guarding or rigidity. No Cyanosis, Clubbing or edema, No new Rash or bruise  Data Reviewed: I have personally reviewed following labs and imaging studies  CBC: Recent Labs  Lab 06/08/23 1305 06/08/23 1317 06/09/23 0422 06/10/23 0410  WBC 5.8  --  5.0 4.4  NEUTROABS 4.0  --   --   --   HGB 8.0* 7.8* 7.3* 9.3*  HCT 26.8* 23.0* 23.9* 28.6*  MCV 81.5  --  79.9* 80.3   PLT 236  --  203 220    Basic Metabolic Panel: Recent Labs  Lab 06/08/23 1305 06/08/23 1317 06/09/23 0422 06/10/23 0410  NA 135 138 135 137  K 3.3* 3.5 3.6 3.9  CL 104 107 106 107  CO2 20*  --  19* 19*  GLUCOSE 105* 99 90 101*  BUN 16 13 11 10   CREATININE 0.84 0.80 0.79 0.90  CALCIUM 9.2  --  8.6* 8.8*  MG 2.1  --   --   --   PHOS 3.7  --   --   --     GFR: CrCl cannot be calculated (Unknown ideal weight.).  Liver Function Tests: Recent Labs  Lab 06/08/23 1305 06/09/23 0422  AST 22 19  ALT 11 10  ALKPHOS 75 65  BILITOT 0.6 0.3  PROT 7.8 6.2*  ALBUMIN 3.7 2.9*    CBG: Recent Labs  Lab 06/08/23 1246 06/08/23 2109  GLUCAP 90 89     No results found for this or any previous visit (from the past 240 hours).       Radiology Studies: CT CHEST WO CONTRAST Result Date: 06/09/2023 CLINICAL DATA:  Pleural mass EXAM: CT CHEST WITHOUT CONTRAST TECHNIQUE: Multidetector CT imaging of the chest was performed following the standard protocol without IV contrast. RADIATION DOSE REDUCTION: This exam was performed according to the departmental dose-optimization program which includes automated exposure control, adjustment of the mA and/or kV according to patient size and/or use of iterative reconstruction technique. COMPARISON:  03/09/2023 FINDINGS: Cardiovascular: Heart is normal size. Aorta is normal caliber. Small pericardial effusion. Scattered coronary artery and aortic calcifications. Mediastinum/Nodes: Mediastinal adenopathy is stable since prior study. Difficult to assess the hila right without IV contrast, but right hilar fullness is suspicious for adenopathy. No axillary adenopathy. Trachea and esophagus are unremarkable. Thyroid unremarkable. Lungs/Pleura: Medial right middle lobe mass again noted measuring 4.0 x 2.2 cm compared to 2.6 x 1.3 cm on prior study when measured at the same level and in the same planes. This is highly suspicious for primary lung cancer.  Anterior right middle lobe nodule measures 8 mm compared to 7 mm previously. Dependent atelectasis or scarring. No effusions. Upper Abdomen: No acute findings Musculoskeletal: Chest wall soft tissues are unremarkable. No acute bony abnormality. IMPRESSION: Medial right middle lobe mass has enlarged since prior study, now measuring approximately 4.0 x 2.2 cm compared to 2.6 x 1.3 cm previously. This is most compatible with primary lung cancer. This could be further evaluated with PET CT. Stable mediastinal adenopathy. Difficult to assess the hila without IV contrast, but right hilar fullness is suspicious for adenopathy. Bibasilar atelectasis or scarring. Small pericardial effusion. Aortic Atherosclerosis (ICD10-I70.0). Electronically Signed   By: Charlett Nose M.D.   On: 06/09/2023 19:34   ECHOCARDIOGRAM COMPLETE BUBBLE STUDY Result Date: 06/09/2023    ECHOCARDIOGRAM REPORT   Patient Name:   HAIDE KLINKER Patchin Date of Exam: 06/09/2023 Medical Rec #:  [id number]        Height:       65.0 in Accession #:    [id number]  Weight:       135.0 lb Date of Birth:  10-10-1940        BSA:          1.674 m Patient Age:    82 years         BP:           158/83 mmHg Patient Gender: F                HR:           63 bpm. Exam Location:  Inpatient Procedure: 2D Echo, Color Doppler, Cardiac Doppler and Saline Contrast Bubble            Study (Both Spectral and Color Flow Doppler were utilized during            procedure). Indications:    Stroke 434.91 / I63.9  History:        Patient has prior history of Echocardiogram examinations, most                 recent 03/10/2023.  Sonographer:    Harriette Bouillon RDCS Referring Phys: [phone] DAVID MANUEL ORTIZ IMPRESSIONS  1. Left ventricular ejection fraction, by estimation, is 60 to 65%. Left ventricular ejection fraction by PLAX is 64 %. The left ventricle has normal function. The left ventricle has no regional wall motion abnormalities. Left ventricular diastolic parameters are consistent  with Grade I diastolic dysfunction (impaired relaxation).  2. Right ventricular systolic function is normal. The right ventricular size is normal.  3. There is no evidence of cardiac tamponade.  4. The mitral valve is abnormal. Mild mitral valve regurgitation.  5. The aortic valve is tricuspid. Aortic valve regurgitation is not visualized.  6. The inferior vena cava is normal in size with greater than 50% respiratory variability, suggesting right atrial pressure of 3 mmHg.  7. Doppler suspicious of left to right atrial level shunting by color flow Doppler. However, agitated saline contrast bubble study was negative, with no evidence of right to left interatrial shunt. There is a likely a patent foramen ovale with predominantly left to right shunting across the atrial septum. Comparison(s): Changes from prior study are noted. 03/10/2023: LVEF 60-65%. Conclusion(s)/Recommendation(s): Findings suggest possible PFO with left to right shunting, however, no right to left shunting seen by saline microbubble contrast - even with valsalva. An area of bubble "[place]" was noted at the atrial septum, which could be due to brisk left to right flow. Consider further evaluation with transesophageal echocardiogram and bubble study which would provide more definitive images. FINDINGS  Left Ventricle: Left ventricular ejection fraction, by estimation, is 60 to 65%. Left ventricular ejection fraction by PLAX is 64 %. The left ventricle has normal function. The left ventricle has no regional wall motion abnormalities. The left ventricular internal cavity size was normal in size. There is no left ventricular hypertrophy. Left ventricular diastolic parameters are consistent with Grade I diastolic dysfunction (impaired relaxation). Indeterminate filling pressures. Right Ventricle: The right ventricular size is normal. No increase in right ventricular wall thickness. Right ventricular systolic function is normal. Left Atrium: Left atrial  size was normal in size. Right Atrium: Right atrial size was normal in size. Prominent Chiari network. Pericardium: Trivial pericardial effusion is present. The pericardial effusion is circumferential. There is no evidence of cardiac tamponade. Mitral Valve: The mitral valve is abnormal. Mild mitral valve regurgitation. Tricuspid Valve: The tricuspid valve is grossly normal. Tricuspid valve regurgitation is trivial. Aortic Valve: The aortic valve is  tricuspid. Aortic valve regurgitation is not visualized. Pulmonic Valve: The pulmonic valve was normal in structure. Pulmonic valve regurgitation is not visualized. Aorta: The aortic root and ascending aorta are structurally normal, with no evidence of dilitation. Venous: The inferior vena cava is normal in size with greater than 50% respiratory variability, suggesting right atrial pressure of 3 mmHg. IAS/Shunts: The interatrial septum is aneurysmal. Evidence of atrial level shunting detected by color flow Doppler. Agitated saline contrast was given intravenously to evaluate for intracardiac shunting. Agitated saline contrast bubble study was negative, with no evidence of any interatrial shunt. A moderately sized patent foramen ovale is detected with predominantly left to right shunting across the atrial septum.  LEFT VENTRICLE PLAX 2D LV EF:         Left            Diastology                ventricular     LV e' medial:    5.98 cm/s                ejection        LV E/e' medial:  11.5                fraction by     LV e' lateral:   9.03 cm/s                PLAX is 64      LV E/e' lateral: 7.6                %. LVIDd:         4.10 cm LVIDs:         2.70 cm LV PW:         1.00 cm LV IVS:        1.00 cm LVOT diam:     2.00 cm LV SV:         65 LV SV Index:   39 LVOT Area:     3.14 cm  RIGHT VENTRICLE             IVC RV S prime:     19.90 cm/s  IVC diam: 1.40 cm TAPSE (M-mode): 1.7 cm LEFT ATRIUM           Index        RIGHT ATRIUM           Index LA diam:      3.60 cm  2.15 cm/m   RA Area:     10.10 cm LA Vol (A2C): 38.0 ml 22.70 ml/m  RA Volume:   19.80 ml  11.83 ml/m LA Vol (A4C): 34.8 ml 20.79 ml/m  AORTIC VALVE LVOT Vmax:   86.20 cm/s LVOT Vmean:  59.300 cm/s LVOT VTI:    0.207 m  AORTA Ao Root diam: 3.10 cm Ao Asc diam:  3.20 cm MITRAL VALVE MV Area (PHT): 2.35 cm    SHUNTS MV Decel Time: 323 msec    Systemic VTI:  0.21 m MV E velocity: 68.70 cm/s  Systemic Diam: 2.00 cm MV A velocity: 87.50 cm/s MV E/A ratio:  0.79 Zoila Shutter MD Electronically signed by Zoila Shutter MD Signature Date/Time: 06/09/2023/4:47:04 PM    Final    VAS US CAROTID Result Date: 06/09/2023 Carotid Arterial Duplex Study Patient Name:  SALIMATOU SIMONE Kellison  Date of Exam:   06/09/2023 Medical Rec #: [id number]         Accession #:  [id number] Date of Birth: [birth year]         Patient Gender: F Patient Age:   7 years Exam Location:  [facility] Procedure:      VAS US CAROTID Referring Phys: DAVID ORTIZ --------------------------------------------------------------------------------  Indications:       CVA, Speech disturbance, Numbness and Weakness. Risk Factors:      Hyperlipidemia, no history of smoking. Other Factors:     Late onset Alzheimer's, Lupus, right middle lobe lung mass,                    question malignancy. Comparison Study:  No prior study Performing Technologist: Sherren Kerns RVS  Examination Guidelines: A complete evaluation includes B-mode imaging, spectral Doppler, color Doppler, and power Doppler as needed of all accessible portions of each vessel. Bilateral testing is considered an integral part of a complete examination. Limited examinations for reoccurring indications may be performed as noted.  Right Carotid Findings: +----------+--------+--------+--------+------------------+--------+           PSV cm/sEDV cm/sStenosisPlaque DescriptionComments +----------+--------+--------+--------+------------------+--------+ CCA Prox  98      19                                          +----------+--------+--------+--------+------------------+--------+ CCA Distal78      16                                         +----------+--------+--------+--------+------------------+--------+ ICA Prox  75      27              heterogenous               +----------+--------+--------+--------+------------------+--------+ ICA Mid   82      27                                         +----------+--------+--------+--------+------------------+--------+ ICA Distal130     33                                tortuous +----------+--------+--------+--------+------------------+--------+ ECA       77      9                                          +----------+--------+--------+--------+------------------+--------+ +----------+--------+-------+--------+-------------------+           PSV cm/sEDV cmsDescribeArm Pressure (mmHG) +----------+--------+-------+--------+-------------------+ Subclavian101                                        +----------+--------+-------+--------+-------------------+ +---------+--------+--+--------+--+ VertebralPSV cm/s60EDV cm/s17 +---------+--------+--+--------+--+  Left Carotid Findings: +----------+--------+--------+--------+------------------+--------+           PSV cm/sEDV cm/sStenosisPlaque DescriptionComments +----------+--------+--------+--------+------------------+--------+ CCA Prox  84      16                                         +----------+--------+--------+--------+------------------+--------+  CCA Distal70      17                                         +----------+--------+--------+--------+------------------+--------+ ICA Prox  38      10              heterogenous               +----------+--------+--------+--------+------------------+--------+ ICA Mid   89      26                                         +----------+--------+--------+--------+------------------+--------+ ICA  Distal79      25                                         +----------+--------+--------+--------+------------------+--------+ ECA       114     11                                         +----------+--------+--------+--------+------------------+--------+ +----------+--------+--------+--------+-------------------+           PSV cm/sEDV cm/sDescribeArm Pressure (mmHG) +----------+--------+--------+--------+-------------------+ [id number]                                          +----------+--------+--------+--------+-------------------+ +---------+--------+--+--------+--+ VertebralPSV cm/s41EDV cm/s15 +---------+--------+--+--------+--+   Summary: Right Carotid: The extracranial vessels were near-normal with only minimal wall                thickening or plaque. Left Carotid: The extracranial vessels were near-normal with only minimal wall               thickening or plaque. Vertebrals:  Bilateral vertebral arteries demonstrate antegrade flow. Subclavians: Normal flow hemodynamics were seen in bilateral subclavian              arteries. *See table(s) above for measurements and observations.     Preliminary    CT ANGIO HEAD NECK W WO CM Result Date: 06/09/2023 CLINICAL DATA:  83 year old female neurologic deficit. Multifocal infarcts in the anterior and posterior circulation on MRI yesterday. EXAM: CT ANGIOGRAPHY HEAD AND NECK WITH AND WITHOUT CONTRAST TECHNIQUE: Multidetector CT imaging of the head and neck was performed using the standard protocol during bolus administration of intravenous contrast. Multiplanar CT image reconstructions and MIPs were obtained to evaluate the vascular anatomy. Carotid stenosis measurements (when applicable) are obtained utilizing NASCET criteria, using the distal internal carotid diameter as the denominator. RADIATION DOSE REDUCTION: This exam was performed according to the departmental dose-optimization program which includes automated exposure  control, adjustment of the mA and/or kV according to patient size and/or use of iterative reconstruction technique. CONTRAST:  60mL OMNIPAQUE IOHEXOL 350 MG/ML SOLN COMPARISON:  Brain MRI and head CT yesterday. CTA chest 03/09/2023. FINDINGS: CT HEAD Brain: Hypodense cytotoxic edema most pronounced in the left PCA territory, stable there from the CT yesterday. Increased visualization of cytotoxic edema in the left thalamus since yesterday (series 5, image 14). Other small areas of abnormal  diffusion on MRI remain occult by CT. No acute intracranial hemorrhage identified. No acute intracranial hemorrhage identified. Stable ventricle size and configuration. Normal basilar cisterns. Calvarium and skull base: Intact, stable. Paranasal sinuses: Visualized paranasal sinuses and mastoids are stable and well aerated. Orbits: No acute orbit or scalp soft tissue finding. CTA NECK Skeleton: Cervical spine degeneration. No acute osseous abnormality identified. Upper chest: Paraseptal emphysema or scattered pneumatocele is. Abnormal bulky superior mediastinal lymphadenopathy, appears malignant (series 10, image 168) and progressed since January. Suspected primary right middle lobe lung mass at that time not included today. Other neck: Thoracic inlet metastatic lymphadenopathy, bilateral lower neck level 4 stations is progressed since January (series 10, image 141). No other metastatic disease identified in the neck. Aortic arch: 3 vessel arch.  No significant arch atherosclerosis. Right carotid system: Tortuosity. Patent with no significant plaque or stenosis through the right carotid bifurcation. 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 The saccular component is also roughly 8 mm. No associated stenosis. Left carotid system: Tortuous left CCA and patent left carotid bifurcation  without stenosis. However, attenuated right ICA distal to the bulb, circumferentially narrow due to 2-3 mm diameter. This remains patent to the skull base, with a small area of fusiform aneurysmal dilatation measuring 5-6 mm just below the skull base series 17, image 30. Vertebral arteries: Proximal subclavian arteries, cervical vertebral arteries are patent with tortuosity, no significant plaque or stenosis to the skull base. CTA HEAD Posterior circulation: Right vertebral artery is mildly dominant throughout. Patent distal vertebral arteries and vertebrobasilar junction with tortuosity but no significant plaque or stenosis. Patent PICA origins. Patent basilar artery without stenosis. Patent SCA and left PCA origins. Fetal type right PCA origin. Prominent left posterior communicating artery also, which in light of the below left ICA siphon findings is probably collateral supply to the left ICA terminus. Left PCA is severely irregular and attenuated beginning at the junction of the P1 and posterior communicating artery, series 15, image 20, and P3 branches are poorly enhancing. Contralateral right PCA branches appear more normal, mildly irregular. Anterior circulation:  Both ICA siphons are patent. But the left petrous ICA is highly irregular and stenotic as seen on series 12, image 126, with probable soft plaque (series 14, image 152). Left ICA remains patent, appears more normal in the cavernous and supraclinoid segments. Contralateral right ICA siphon is ectatic throughout, patent without stenosis. Both posterior communicating artery origins are normal. Patent carotid termini, MCA and ACA origins. Left ACA A1 is dominant and mildly irregular. Anterior communicating artery is diminutive but patent. Bilateral ACA branches are patent with mild irregularity. Right MCA M1 segment and bifurcation are patent without stenosis. Left MCA M1 segment and bifurcation are patent without stenosis. Bilateral MCA branches are  patent with mild irregularity. Venous sinuses: Patent. Anatomic variants: Dominant right vertebral artery, right ACA A1, fetal type right PCA origin. Review of the MIP images confirms the above findings IMPRESSION: 1. Progressed since January and bulky Metastatic Lymphadenopathy in the visible mediastinum, bilateral lower neck/thoracic inlet. 2. Positive for Left PCA severe stenosis, ultimately occluded in the P3 segments, concordant with acute ischemia demonstrated by MRI. 3. Positive also for highly abnormal bilateral Internal Carotid Arteries: - Attenuated, Stenotic cervical Left ICA and Left siphon, including High-grade stenosis in the petrous segment. Evidence of collateral supply  to the Left ICA terminus from the posterior communicating artery, posterior circulation. 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 4. No other hemodynamically significant arterial stenosis in the head or neck. 5. Expected CT appearance of ischemia demonstrated by MRI yesterday. No hemorrhagic transformation or intracranial mass effect. Electronically Signed   By: Odessa Fleming M.D.   On: 06/09/2023 09:21   MR BRAIN WO CONTRAST Result Date: 06/08/2023 CLINICAL DATA:  Acute neurologic deficit EXAM: MRI HEAD WITHOUT CONTRAST TECHNIQUE: Multiplanar, multiecho pulse sequences of the brain and surrounding structures were obtained without intravenous contrast. COMPARISON:  None Available. FINDINGS: Brain: Multifocal abnormal diffusion restriction, including the left occipital lobe, left temporal lobe, left thalamus and within the bilateral parietal subcortical white matter. Petechial hemorrhage within the posterior right hemisphere. There is multifocal hyperintense T2-weighted signal within the white matter. Generalized volume loss. The midline structures are normal. Vascular: Normal flow voids. Skull and upper cervical  spine: Normal calvarium and skull base. Visualized upper cervical spine and soft tissues are normal. Sinuses/Orbits:No paranasal sinus fluid levels or advanced mucosal thickening. No mastoid or middle ear effusion. Normal orbits. IMPRESSION: 1. Multifocal acute ischemia, including the left occipital lobe, left temporal lobe, left thalamus and within the bilateral parietal subcortical white matter. No hemorrhage or mass effect. 2. Petechial hemorrhage within the posterior right hemisphere. Electronically Signed   By: Deatra Robinson M.D.   On: 06/08/2023 20:06   CT Head Wo Contrast Addendum Date: 06/08/2023 ADDENDUM REPORT: 06/08/2023 16:43 ADDENDUM: These results were called by telephone at the time of interpretation on 06/08/2023 at 4:37 pm to provider Dr. Rodena Medin, who verbally acknowledged these results. Electronically Signed   By: Darliss Cheney M.D.   On: 06/08/2023 16:43   Result Date: 06/08/2023 CLINICAL DATA:  Mental status change EXAM: CT HEAD WITHOUT CONTRAST TECHNIQUE: Contiguous axial images were obtained from the base of the skull through the vertex without intravenous contrast. RADIATION DOSE REDUCTION: This exam was performed according to the departmental dose-optimization program which includes automated exposure control, adjustment of the mA and/or kV according to patient size and/or use of iterative reconstruction technique. COMPARISON:  MRI brain 02/24/2021.  Head CT 02/02/2020. FINDINGS: Brain: Focal area of hypodensity with loss of gray-white matter distinction is seen in the left occipital lobe, new from prior. No significant mass effect or midline shift, but there is associated sulcal effacement. No acute intracranial hemorrhage or hydrocephalus. There is mild diffuse atrophy. There is a small old cortical infarct in the high right parietal region which is new from 2021. Vascular: No hyperdense vessel or unexpected calcification. Skull: Normal. Negative for fracture or focal lesion.  Sinuses/Orbits: No acute finding. Other: None. IMPRESSION: 1. Acute to subacute infarct in the left occipital lobe, new from prior. No significant mass effect or midline shift. No acute hemorrhage. 2. Small old cortical infarct in the high right parietal region, new from 2021. Electronically Signed: By: Darliss Cheney M.D. On: 06/08/2023 16:32   CT Cervical Spine Wo Contrast Result Date: 06/08/2023 CLINICAL DATA:  Trauma EXAM: CT CERVICAL SPINE WITHOUT CONTRAST TECHNIQUE: Multidetector CT imaging of the cervical spine was performed without intravenous contrast. Multiplanar CT image reconstructions were also generated. RADIATION DOSE REDUCTION: This exam was performed according to the departmental dose-optimization program which includes automated exposure control, adjustment of the mA and/or kV according to patient size and/or use of iterative reconstruction technique. COMPARISON:  Cervical spine CT 02/02/2020 FINDINGS: Alignment: There is 2 mm of anterolisthesis at C7-T1 which is favored is degenerative and unchanged. Alignment is otherwise anatomic. Skull base and vertebrae: No acute fracture. No primary bone lesion or focal pathologic process. Soft tissues and spinal canal: No prevertebral fluid or swelling. No visible canal hematoma. Disc levels: There is moderate severe disc space narrowing and endplate osteophyte formation at C4-C5, C5-C6 and C6-C7 compatible with degenerative change. There is diffuse bilateral facet arthropathy causing mild neural foraminal stenosis most significant at C3-C4, C4-C5 and C5-C6 on right. Posterior disc osteophyte complexes are seen at C4-C5, C5-C6 and C6-C7 causing mild central canal stenosis. Upper chest: Negative. Other: None. IMPRESSION: 1. No acute fracture or traumatic subluxation of the cervical spine. 2. Multilevel degenerative changes of the cervical spine. Electronically Signed   By: Darliss Cheney M.D.   On: 06/08/2023 16:36        Scheduled Meds:  aspirin EC   81 mg Oral Daily   atorvastatin  20 mg Oral Daily   Continuous Infusions:  heparin 1,000 Units/hr (06/10/23 1140)     LOS: 2 days       Huey Bienenstock, MD [facility]   To contact the attending provider between 7A-7P or the covering provider during after hours 7P-7A, please log into the web site [email or web address] and access using universal [place] password for that web site. If you do not have the password, please call the hospital operator.  06/10/2023, 12:52 PM

## 2023-06-10 NOTE — Plan of Care (Signed)
  Problem: Education: Goal: Knowledge of disease or condition will improve Outcome: Not Progressing Goal: Knowledge of secondary prevention will improve (MUST DOCUMENT ALL) Outcome: Not Progressing Goal: Knowledge of patient specific risk factors will improve (DELETE if not current risk factor) Outcome: Not Progressing   Problem: Ischemic Stroke/TIA Tissue Perfusion: Goal: Complications of ischemic stroke/TIA will be minimized Outcome: Not Progressing   Problem: Coping: Goal: Will verbalize positive feelings about self Outcome: Progressing Goal: Will identify appropriate support needs Outcome: Progressing   Problem: Health Behavior/Discharge Planning: Goal: Ability to manage health-related needs will improve Outcome: Not Progressing Goal: Goals will be collaboratively established with patient/family Outcome: Not Progressing   Problem: Self-Care: Goal: Ability to participate in self-care as condition permits will improve Outcome: Not Progressing Goal: Verbalization of feelings and concerns over difficulty with self-care will improve Outcome: Not Progressing Goal: Ability to communicate needs accurately will improve Outcome: Not Progressing   Problem: Nutrition: Goal: Risk of aspiration will decrease Outcome: Not Progressing Goal: Dietary intake will improve Outcome: Not Progressing   Problem: Education: Goal: Knowledge of General Education information will improve Description: Including pain rating scale, medication(s)/side effects and non-pharmacologic comfort measures Outcome: Not Progressing   Problem: Health Behavior/Discharge Planning: Goal: Ability to manage health-related needs will improve Outcome: Not Progressing   Problem: Clinical Measurements: Goal: Ability to maintain clinical measurements within normal limits will improve Outcome: Not Progressing Goal: Will remain free from infection Outcome: Not Progressing Goal: Diagnostic test results will  improve Outcome: Not Progressing Goal: Respiratory complications will improve Outcome: Not Progressing Goal: Cardiovascular complication will be avoided Outcome: Not Progressing   Problem: Activity: Goal: Risk for activity intolerance will decrease Outcome: Progressing   Problem: Nutrition: Goal: Adequate nutrition will be maintained Outcome: Progressing   Problem: Coping: Goal: Level of anxiety will decrease Outcome: Progressing   Problem: Elimination: Goal: Will not experience complications related to bowel motility Outcome: Progressing Goal: Will not experience complications related to urinary retention Outcome: Progressing   Problem: Safety: Goal: Ability to remain free from injury will improve Outcome: Not Progressing   Problem: Skin Integrity: Goal: Risk for impaired skin integrity will decrease Outcome: Progressing

## 2023-06-10 NOTE — Plan of Care (Signed)

## 2023-06-10 NOTE — PMR Pre-admission (Signed)
 PMR Admission Coordinator Pre-Admission Assessment  Patient: Tara Martin is an 83 y.o., female MRN: [id number] DOB: 29-Nov-1940 Height: 5\' 5"  (165.1 cm) Weight: 58.3 kg           Insurance Information HMO:     PPO: yes     PCP:      IPA:      80/20:      OTHER:  PRIMARY: [employer] Medicare HMO/PPO      Policy#: [id number]      Subscriber: pt CM Name: Tara Martin      Phone#: [phone]     Fax#: [phone] Pre-Cert#: [id number]  approved 4/8 until 4/18    Employer:  Benefits:  Phone #: [phone]     Name: 4/4 Eff. Date: 03/09/23     Deduct: none      Out of Pocket Max: $4150      Life Max: none  CIR: $300 co pay per day days 1 until 6      SNF: $10 co pay per day days 1 until 20; $214 co pay per day days 21 until 100 Outpatient: $10 per visit     Co-Pay:  Home Health: 100%      Co-Pay:  DME: 80%     Co-Pay: 20% Providers: in network  SECONDARY: none      Policy#:       Phone#:   Artist:       Phone#:   The Data processing manager" for patients in Inpatient Rehabilitation Facilities with attached "Privacy Act Statement-Health Care Records" was provided and verbally reviewed with: Patient and Family  Emergency Contact Information Contact Information     Name Relation Home Work [place]   [phone]   Appleby,Kelli Daughter   [phone]      Other Contacts     Name Relation Home Work Mobile   Chelsea Friend   [phone]      Current Medical History  Patient Admitting Diagnosis: CVA  History of Present Illness:  Tara Martin is a 83 y.o. 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 She was admitted for confusion altered mental status and speech difficulty on 06/08/23.   Code stroke was initiated and CT of the head demonstrated a left occipital lobe infarct.  CTA of the head and neck revealed severe left PCA  stenosis, occluded at the left P3.  She also has an attenuated stenotic cervical left ICA and left siphon on.  There is evidence of collaterals supplied to the left ICA terminus from the posterior communicating artery and posterior circulation.  MRI of the brain noted multifocal acute ischemia involving the left occipital lobe, left temporal lobe, left thalamus and within the bilateral parietal subcortical white matter.    Right middle lung lobe mass pending 06/14/23. On full dose Lovenox pending bx. Has been on Apixaban twice daily due to PEs. Long term anticoagulation plan pending.  Complete NIHSS TOTAL: 9 Glasgow Coma Scale Score: 15  Patient's medical record from [facility] has been reviewed by the rehabilitation admission coordinator and physician.  Past Medical History  Past Medical History:  Diagnosis Date   Anxiety state, unspecified - sees Dr. Jennette Kettle in gyn and benzo prescribed there 04/09/2013   Arthritis    Chicken pox    Hx of blood clots    jan 2025   Lupus    MCI (  mild cognitive impairment) 05/20/2020   Migraines    Prediabetes    Has the patient had major surgery during 100 days prior to admission? yes  Family History  family history includes Alcoholism in an other family member; Alzheimer's disease in her brother and sister; Arthritis in her mother and another family member; COPD in her sister; Diabetes in her mother; Heart attack in her mother; Heart disease in her brother and mother; Hypertension in an other family member; Migraines in her daughter; Pneumonia in her father; Pulmonary embolism in her brother; Sudden death in an other family member.  Current Medications   Current Facility-Administered Medications:    0.9 %  sodium chloride infusion, , Intravenous, Continuous, Elgergawy, Leana Roe, MD, Last Rate: 75 mL/hr at 06/14/23 0933, New Bag at 06/14/23 0933   acetaminophen (TYLENOL) tablet 650 mg, 650 mg, Oral, Q6H PRN **OR** acetaminophen (TYLENOL)  suppository 650 mg, 650 mg, Rectal, Q6H PRN, Bobette Mo, MD   ALPRAZolam Prudy Feeler) tablet 0.5 mg, 0.5 mg, Oral, Daily PRN, Elgergawy, Leana Roe, MD, 0.5 mg at 06/11/23 2015   aspirin EC tablet 81 mg, 81 mg, Oral, Daily, Milon Dikes, MD, 81 mg at 06/13/23 1026   atorvastatin (LIPITOR) tablet 20 mg, 20 mg, Oral, Daily, Marvel Plan, MD, 20 mg at 06/13/23 1026   donepezil (ARICEPT) tablet 10 mg, 10 mg, Oral, QHS, Elgergawy, Leana Roe, MD, 10 mg at 06/13/23 2016   [START ON 06/15/2023] enoxaparin (LOVENOX) injection 60 mg, 60 mg, Subcutaneous, Q12H, Lorin Glass, MD   feeding supplement (ENSURE ENLIVE / ENSURE PLUS) liquid 237 mL, 237 mL, Oral, BID BM, Elgergawy, Leana Roe, MD, 237 mL at 06/12/23 1710   ondansetron (ZOFRAN) tablet 4 mg, 4 mg, Oral, Q6H PRN **OR** ondansetron (ZOFRAN) injection 4 mg, 4 mg, Intravenous, Q6H PRN, Bobette Mo, MD   pantoprazole (PROTONIX) EC tablet 40 mg, 40 mg, Oral, Daily, Elgergawy, Leana Roe, MD, 40 mg at 06/13/23 1026  Patients Current Diet:  Diet Order             Diet NPO time specified  Diet effective midnight                  Precautions / Restrictions Precautions Precautions: Fall (Simultaneous filing. User may not have seen previous data.) Restrictions Weight Bearing Restrictions Per Provider Order: No   Has the patient had 2 or more falls or a fall with injury in the past year?No  Prior Activity Level Community (5-7x/wk): I without AD  Prior Functional Level Prior Function Prior Level of Function : Independent/Modified Independent, Driving Mobility Comments: Pt reports no use of AD prior to hospitalization. ADLs Comments: ind  Self Care: Did the patient need help bathing, dressing, using the toilet or eating?  Independent  Indoor Mobility: Did the patient need assistance with walking from room to room (with or without device)? Independent  Stairs: Did the patient need assistance with internal or external stairs (with or  without device)? Independent  Functional Cognition: Did the patient need help planning regular tasks such as shopping or remembering to take medications? Independent  Patient Information Are you of Hispanic, Latino/a,or Spanish origin?: A. No, not of Hispanic, Latino/a, or Spanish origin What is your race?: A. White Do you need or want an interpreter to communicate with a doctor or health care staff?: 0. No  Patient's Response To:  Health Literacy and Transportation Is the patient able to respond to health literacy and transportation needs?: Yes Health Literacy -  How often do you need to have someone help you when you read instructions, pamphlets, or other written material from your doctor or pharmacy?: Never In the past 12 months, has lack of transportation kept you from medical appointments or from getting medications?: No In the past 12 months, has lack of transportation kept you from meetings, work, or from getting things needed for daily living?: No  Journalist, newspaper / Equipment Home Equipment: BSC/3in1, Information systems manager  Prior Device Use: Indicate devices/aids used by the patient prior to current illness, exacerbation or injury?  cane  Current Functional Level Cognition  Arousal/Alertness: Awake/alert Overall Cognitive Status: Impaired/Different from baseline Orientation Level: Oriented to person, Oriented to place Attention: Sustained Sustained Attention: Impaired Sustained Attention Impairment: Verbal basic, Functional basic Memory: Impaired Memory Impairment: Other (comment) (impacted by aphasia) Awareness: Impaired    Extremity Assessment (includes Sensation/Coordination)  Upper Extremity Assessment: RUE deficits/detail RUE Deficits / Details: good strength, poor awareness and coordination due to recent stroke, R side neglect. Significant difficulty holding items or performing functional tasks. Right side more affected. RUE: Shoulder pain with ROM RUE Sensation:  decreased light touch RUE Coordination: decreased fine motor, decreased gross motor LUE Deficits / Details: Pt uses L side in a more coordinated fashion but is accustomed to being R handed so is using R hand more than left. LUE Coordination: decreased fine motor, decreased gross motor  Lower Extremity Assessment: Defer to PT evaluation RLE Deficits / Details: Ataxic, Ankle DF 4/5, PF 5/5, knee extension 4+/5 hip extension 4+/5, hip flexion 4/5, knee flexion 4+/5 RLE Sensation: decreased light touch, decreased proprioception RLE Coordination: decreased gross motor, decreased fine motor    ADLs  Overall ADL's : Needs assistance/impaired Eating/Feeding: Moderate assistance, Sitting, Cueing for sequencing Eating/Feeding Details (indicate cue type and reason): Pt using cup with increased independence. Vision limits abiltiy to feed self without help. Pt often cannot find food on plate despite cues and using clock to orient self to plate. Pt unsure of clock set up and thefore these cues are not effective. Pt apraxia and incoordiation in RUE making feeding self R handed difficult. Grooming: Moderate assistance, Standing, Cueing for sequencing, Oral care Grooming Details (indicate cue type and reason): Pt stood at sink to groom. Unable to sequence or plan the task without max cues. Pt unable to find items on countertop even if they were on her L and harder time finding them on the R.  Once pt got started with brushing, pt brushed with min assist tomove toothbrush around in hand correctly due to apraxia. Upper Body Bathing: Moderate assistance, Standing Lower Body Bathing: Maximal assistance, Cueing for sequencing, Sitting/lateral leans Upper Body Dressing : Maximal assistance, Cueing for sequencing, Sitting Lower Body Dressing: Maximal assistance, Cueing for sequencing, Sitting/lateral leans Lower Body Dressing Details (indicate cue type and reason): Pt required step by step cues to donn socks in sitting.  Physically required min assist. Toilet Transfer: Minimal assistance, Rolling walker (2 wheels) Toilet Transfer Details (indicate cue type and reason): simulated Toileting- Clothing Manipulation and Hygiene: Maximal assistance, Cueing for safety, Cueing for sequencing, Sitting/lateral lean, Sit to/from stand Functional mobility during ADLs: Moderate assistance, Rolling walker (2 wheels) General ADL Comments: Pt very limited due to poor vision, R inattention and motor planning and sequencing deficits.    Mobility  Overal bed mobility: Needs Assistance Bed Mobility: Supine to Sit Supine to sit: HOB elevated, Contact guard Sit to supine: Min assist, HOB elevated General bed mobility comments: pt in chair on  arrival (Simultaneous filing. User may not have seen previous data.)    Transfers  Overall transfer level: Needs assistance (Simultaneous filing. User may not have seen previous data.) Equipment used: Rolling walker (2 wheels) (Simultaneous filing. User may not have seen previous data.) Transfers: Sit to/from Stand, Bed to chair/wheelchair/BSC (Simultaneous filing. User may not have seen previous data.) Sit to Stand: Min assist (Simultaneous filing. User may not have seen previous data.) Bed to/from chair/wheelchair/BSC transfer type:: Step pivot Step pivot transfers: Min assist General transfer comment: cues for hand placement and safety and to get hands positioned on walker correctly before walking. (Simultaneous filing. User may not have seen previous data.)    Ambulation / Gait / Stairs / Wheelchair Mobility  Ambulation/Gait Ambulation/Gait assistance: Editor, commissioning (Feet): 100 Feet Assistive device: Rolling walker (2 wheels) Gait Pattern/deviations: Step-through pattern, Decreased stride length, Ataxic, Drifts right/left, Wide base of support General Gait Details: Intermittent min assist for RW control and to guide body into RW for proper alignment - drifts outside of  walker BOS with turns. Needs intermittent cues for awareness of objects/obstacles on Rt multimodal cues to avoid 50% of the time avoiding on her own with only verbal cues. No overt buckling. VSS. Gait velocity: dec Gait velocity interpretation: <1.31 ft/sec, indicative of household ambulator    Posture / Balance Dynamic Sitting Balance Sitting balance - Comments: EOB (Simultaneous filing. User may not have seen previous data.) Balance Overall balance assessment: Needs assistance (Simultaneous filing. User may not have seen previous data.) Sitting-balance support: No upper extremity supported, Feet supported (Simultaneous filing. User may not have seen previous data.) Sitting balance-Leahy Scale: Fair (Simultaneous filing. User may not have seen previous data.) Sitting balance - Comments: EOB (Simultaneous filing. User may not have seen previous data.) Standing balance support: Bilateral upper extremity supported, During functional activity (Simultaneous filing. User may not have seen previous data.) Standing balance-Leahy Scale: Poor (Simultaneous filing. User may not have seen previous data.) Standing balance comment: min assist for all tasks in standing when at the sink. (Simultaneous filing. User may not have seen previous data.)    Special needs/care consideration Spouse has LTC policy and plans to hire caregiver supports at home. Will need some guidance.     Previous Home Environment  Living Arrangements: Spouse/significant other  Lives With: Spouse Available Help at Discharge: Family, Available 24 hours/day Type of Home: House Home Layout: Two level, Able to live on main level with bedroom/bathroom Home Access: Stairs to enter Entrance Stairs-Rails: Left Entrance Stairs-Number of Steps: 3 on the side and 5 in the front one rail on side with brick wall and bil rails in front Bathroom Shower/Tub: Engineer, manufacturing systems: Handicapped height Bathroom Accessibility: Yes How  Accessible: Accessible via walker Home Care Services: No Additional Comments: lives with spouse who is visually impaired. Daughter reports she can stay as long as needed  Discharge Living Setting Plans for Discharge Living Setting: Patient's home, Lives with (comment) (spouse) Type of Home at Discharge: Mobile home Discharge Home Layout: Two level, Able to live on main level with bedroom/bathroom Discharge Home Access: Stairs to enter Entrance Stairs-Rails: Left Entrance Stairs-Number of Steps: 3 on the side and 5 in the front Discharge Bathroom Shower/Tub: Tub/shower unit Discharge Bathroom Toilet: Handicapped height Discharge Bathroom Accessibility: Yes How Accessible: Accessible via walker Does the patient have any problems obtaining your medications?: No  Social/Family/Support Systems Patient Roles: Spouse, Parent Contact Information: spouse and daughter Anticipated Caregiver: spouse and daughter Anticipated Caregiver's  Contact Information: see contacts Ability/Limitations of Caregiver: spouse is legally blind but very active and independent Caregiver Availability: 24/7 Discharge Plan Discussed with Primary Caregiver: Yes Is Caregiver In Agreement with Plan?: Yes Does Caregiver/Family have Issues with Lodging/Transportation while Pt is in Rehab?: No  Goals Patient/Family Goal for Rehab: supervision with PT, OT and SLP Expected length of stay: ELOS 12 to 17 days Pt/Family Agrees to Admission and willing to participate: Yes Program Orientation Provided & Reviewed with Pt/Caregiver Including Roles  & Responsibilities: Yes  Decrease burden of Care through IP rehab admission: n/a  Possible need for SNF placement upon discharge:may if not reach supervision level  Patient Condition: This patient's condition remains as documented in the consult dated 06/10/23, in which the Rehabilitation Physician determined and documented that the patient's condition is appropriate for intensive  rehabilitative care in an inpatient rehabilitation facility. Will admit to inpatient rehab today.  Preadmission Screen Completed By:  Clois Dupes, RN MSN 06/14/2023 12:14 PM ______________________________________________________________________   Discussed status with Dr. Berline Chough on 06/14/23 at 1224 and received approval for admission today.  Admission Coordinator:  Clois Dupes RN MSN, time 1224 Date 06/14/23

## 2023-06-10 NOTE — Progress Notes (Addendum)
 PHARMACY - ANTICOAGULATION CONSULT NOTE  Pharmacy Consult for Heparin dosing Indication: stroke, hx PE  Allergies  Allergen Reactions   Codeine Other (See Comments)    Per patient, this made her "flip out"    Patient Measurements: Height: 5\' 5"  (165.1 cm) IBW/kg (Calculated) : 57  Vital Signs: Temp: 97.6 F (36.4 C) (04/04 0800) Temp Source: Oral (04/04 0800) BP: 136/62 (04/04 0800) Pulse Rate: 68 (04/04 0800)  Labs: Recent Labs    06/08/23 1305 06/08/23 1317 06/09/23 0422 06/09/23 1407 06/09/23 2150 06/10/23 0410 06/10/23 0906  HGB 8.0* 7.8* 7.3*  --   --  9.3*  --   HCT 26.8* 23.0* 23.9*  --   --  28.6*  --   PLT 236  --  203  --   --  220  --   APTT 30  --   --   --  37*  --  49*  LABPROT 19.5*  --   --   --   --   --   --   INR 1.6*  --   --   --   --   --   --   HEPARINUNFRC  --   --   --  >1.10* 0.60  --  0.49  CREATININE 0.84 0.80 0.79  --   --  0.90  --    CrCl cannot be calculated (Unknown ideal weight.).  Medical History: Past Medical History:  Diagnosis Date   Anxiety state, unspecified - sees Dr. Jennette Kettle in gyn and benzo prescribed there 04/09/2013   Arthritis    Chicken pox    Hx of blood clots    jan 2025   Lupus    MCI (mild cognitive impairment) 05/20/2020   Migraines    Prediabetes     Assessment: Tara Martin is 83 YO female that was admitted 06/08/2023 for CVA. Previously on Eliquis 5 mg po BID for PE (03/09/2023). Anticipate need for biopsy for suspected lung malignancy. Pharmacy consulted for heparin dosing. PTA Eliquis last dose 4/2 0900. This will require aPTT monitoring until aPTT and antiXa are correlating.  Hgb 7, Platelets 200 stable  4/4 AM: Heparin level 0.49- remains falsely elevated d/t recent DOAC use, but beginning to correlate with aPTT which remains subtherapeutic at  49 seconds with heparin running at 850 units/hour. Hgb 7.3>>9.3 s/p PRBC transfusion, PLT stable. No signs of bleeding or issues with the heparin infusion noted.     Goal of Therapy:  Heparin level 0.3-0.5 units/ml aPTT 66-85s Monitor platelets by anticoagulation protocol: Yes   Plan:  Inc heparin to 1,000 units/hr, no bolus  Heparin level and aPTT in 8 hours  ADDENDUM:  Pharmacy now consulted to transition heparin to Lovenox. CCM planning lung biopsy on 4/8.   - STOP heparin infusion  - START lovenox 55 mg [place] Q12H- administer first dose at the same time heparin infusion is stopped, plan for first dose 1800 so patient is not awakened in the night  - Per CCM- hold Lovenox AM dose on Tuesday, 06/14/23  - daily CBC    Tara Martin, PharmD Clinical Pharmacist  06/10/2023 11:32 AM

## 2023-06-10 NOTE — Progress Notes (Addendum)
 0800 patient alert to self and that's she's not home patient assisted to [facility] max assist needed patient confused right from left.  1800 patient very forgetful requiring max assist with transfers not easy to redirect or to take direction.

## 2023-06-10 NOTE — Progress Notes (Addendum)
  Inpatient Rehabilitation Admissions Coordinator   I will begin insurance approval for possible Cir admit with [facility].  Ottie Glazier, RN, MSN Rehab Admissions Coordinator [phone] 06/10/2023 1:51 PM   Monia Pouch will not allow me to begin Auth for Cir admit on Tuesday 4 days in advance. They recommend I resubmit Auth on Monday for which we will.  Ottie Glazier, RN, MSN Rehab Admissions Coordinator [phone] 06/10/2023 5:45 PM

## 2023-06-10 NOTE — Progress Notes (Signed)
 Physical Therapy Treatment Patient Details Name: Tara Martin MRN: [id number] DOB: Dec 15, 1940 Today's Date: 06/10/2023   History of Present Illness 83 y.o female presenting to the emergency department 4/2 with progressively worse mental status associated with right-sided weakness, confusion and unsteady gait.  MRI shows multifocal infarcts-acute to subacute involving the left PCA territory (open left occipital lobe, left thalamus), bilateral parietal lobes and bilateral cerebellar infarcts. PMH significant of anxiety, osteoarthritis, varicella-zoster, systemic lupus erythematosus, mild cognitive impairment, late onset Alzheimer's, migraine headaches, prediabetes.    PT Comments  Tolerated well, eager to get out of bed and to a recliner today. Showing some improved independence with bed mobility, min assist to scoot to edge. Looking towards right side more frequently but still having significant difficulty locating targets. Pt ambulated 100 feet with mod assist for balance, +2 for equipment and safety assist. No overt buckling, gait symmetry slightly improved but needs constant assist for RW control due to drift and LOB towards right side.  Patient will continue to benefit from skilled physical therapy services to further improve independence with functional mobility. Extensive reorientation today which she was gradually able to recall towards end of session (location, month, and situation.) Patient will benefit from intensive inpatient follow-up therapy, >3 hours/day    If plan is discharge home, recommend the following: A lot of help with walking and/or transfers;A lot of help with bathing/dressing/bathroom;Assistance with cooking/housework;Direct supervision/assist for medications management;Direct supervision/assist for financial management;Assist for transportation;Help with stairs or ramp for entrance;Supervision due to cognitive status   Can travel by private vehicle        Equipment  Recommendations   (TBD next venue)    Recommendations for Other Services Rehab consult     Precautions / Restrictions Precautions Precautions: Fall Recall of Precautions/Restrictions: Impaired Restrictions Weight Bearing Restrictions Per Provider Order: No     Mobility  Bed Mobility Overal bed mobility: Needs Assistance Bed Mobility: Supine to Sit     Supine to sit: Min assist, HOB elevated     General bed mobility comments: Able to rise with effort and HOB elevated no physical assist, but needs min assist to scoot to EOB.    Transfers Overall transfer level: Needs assistance Equipment used: Rolling walker (2 wheels) Transfers: Sit to/from Stand Sit to Stand: Min assist           General transfer comment: Min assist for alignment and stability to rise with RW for support, cues for hand placement and guidance for Rt hand onto RW with multimodal cues. Good control with descent into recliner but lots of cues and guidance to turn and sit in chair.    Ambulation/Gait Ambulation/Gait assistance: Mod assist, +2 safety/equipment Gait Distance (Feet): 100 Feet Assistive device: Rolling walker (2 wheels) Gait Pattern/deviations: Step-through pattern, Decreased stride length, Ataxic, Drifts right/left, Wide base of support, Staggering right Gait velocity: dec Gait velocity interpretation: <1.31 ft/sec, indicative of household ambulator   General Gait Details: Better foot clearance today and some improved symmetry but also provided additional RW control for pt with up to mod assist for balance (Rt lean and drift at times) +2 for safety and equipment management. Practiced gait symmetry with RW, occasionally reminding Pt to find Rt hand and place on RW. Constant assist for RW control. Target finding on right, reading signs (very difficult for pt.) No overt buckling, occasionally stepping out of RW with RLE.   Stairs             Psychologist, prison and probation services  Tilt Bed     Modified Rankin (Stroke Patients Only) Modified Rankin (Stroke Patients Only) Pre-Morbid Rankin Score: No symptoms Modified Rankin: Moderately severe disability     Balance Overall balance assessment: Needs assistance Sitting-balance support: No upper extremity supported, Feet supported Sitting balance-Leahy Scale: Fair Sitting balance - Comments: CGA   Standing balance support: Single extremity supported, During functional activity, Reliant on assistive device for balance Standing balance-Leahy Scale: Poor Standing balance comment: CGA with single UE today                            Communication Communication Communication: Impaired Factors Affecting Communication: Difficulty expressing self  Cognition Arousal: Alert Behavior During Therapy: WFL for tasks assessed/performed   PT - Cognitive impairments: Orientation, Attention, Sequencing, Problem solving, Safety/Judgement, Awareness, Memory, Initiation Difficult to assess due to: Impaired communication Orientation impairments: Place, Time, Situation                     Following commands: Impaired Following commands impaired: Only follows one step commands consistently    Cueing Cueing Techniques: Verbal cues, Gestural cues, Visual cues  Exercises      General Comments General comments (skin integrity, edema, etc.): VSS throughout. SpO2 low 90s on RA when good Pleth obtained.      Pertinent Vitals/Pain Pain Assessment Pain Assessment: No/denies pain    Home Living     Available Help at Discharge: Family;Available 24 hours/day               Additional Comments: lives with spouse who is visually impaired. Daughter reports she can stay as long as needed    Prior Function            PT Goals (current goals can now be found in the care plan section) Acute Rehab PT Goals Patient Stated Goal: get well PT Goal Formulation: With patient/family Time For Goal Achievement:  06/23/23 Potential to Achieve Goals: Good Progress towards PT goals: Progressing toward goals    Frequency    Min 3X/week      PT Plan      Co-evaluation              AM-PAC PT "6 Clicks" Mobility   Outcome Measure  Help needed turning from your back to your side while in a flat bed without using bedrails?: A Little Help needed moving from lying on your back to sitting on the side of a flat bed without using bedrails?: A Little Help needed moving to and from a bed to a chair (including a wheelchair)?: A Lot Help needed standing up from a chair using your arms (e.g., wheelchair or bedside chair)?: A Lot Help needed to walk in hospital room?: A Lot Help needed climbing 3-5 steps with a railing? : Total 6 Click Score: 13    End of Session Equipment Utilized During Treatment: Gait belt Activity Tolerance: Patient tolerated treatment well Patient left: with call bell/phone within reach;in chair;with chair alarm set (Oriented to use of call bell - able to press once before leaving) Nurse Communication: Mobility status (May need reminder how to use call bell) PT Visit Diagnosis: Unsteadiness on feet (R26.81);Other abnormalities of gait and mobility (R26.89);Hemiplegia and hemiparesis;Ataxic gait (R26.0);Other symptoms and signs involving the nervous system (R29.898) Hemiplegia - Right/Left: Right Hemiplegia - caused by: Cerebral infarction     Time: 1505-1530 PT Time Calculation (min) (ACUTE ONLY): 25 min  Charges:    $Gait  Training: 8-22 mins $Therapeutic Activity: 8-22 mins PT General Charges $$ ACUTE PT VISIT: 1 Visit                     Kathlyn Sacramento, PT, DPT [facility]  Rehabilitation Services Physical Therapist Office: [phone] Website: [email or web address]    Tara Martin 06/10/2023, 4:19 PM

## 2023-06-10 NOTE — Care Management Important Message (Signed)
 Important Message  Patient Details  Name: Tara Martin MRN: [id number] Date of Birth: [birth year]   Important Message Given:  Yes - Medicare IM     Dorena Bodo 06/10/2023, 2:51 PM

## 2023-06-10 NOTE — Consult Note (Addendum)
 Physical Medicine and Rehabilitation Consult Reason for Consult: Confusion and impaired functional mobility Referring Physician: Roda Shutters   HPI: Tara Martin is a 83 y.o. 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  Code stroke was initiated and CT of the head demonstrated a left occipital lobe infarct.  CTA of the head and neck revealed severe left PCA stenosis, occluded at the left P3.  She also has an attenuated stenotic cervical left ICA and left siphon on.  There is evidence of collaterals supplied to the left ICA terminus from the posterior communicating artery and posterior circulation.  MRI of the brain noted multifocal acute ischemia involving the left occipital lobe, left temporal lobe, left thalamus and within the bilateral parietal subcortical white matter.  Patient now on IV heparin pending biopsy of her right middle lung lobe mass.  Patient was seen by therapies yesterday and was min assist for sit to stand transfers, mod assist for 12 feet of gait using rolling walker.  Therapy notes poor placement of right lower extremity with ataxia.  Prior to arrival patient was independent and driving.  She lives with her spouse in a two-level home with 3 steps to enter.  She can stay on the first floor if needed.   Review of Systems  Constitutional:  Negative for fever.  HENT:  Negative for hearing loss.   Eyes:  Negative for blurred vision.  Respiratory:  Positive for cough.   Cardiovascular:  Negative for chest pain.  Gastrointestinal:  Negative for heartburn.  Genitourinary: Negative.   Musculoskeletal:  Positive for myalgias.  Skin:  Negative for rash.  Neurological:  Positive for focal weakness and weakness.  Psychiatric/Behavioral:  Positive for memory loss.    Past Medical History:  Diagnosis Date    Anxiety state, unspecified - sees Dr. Jennette Kettle in gyn and benzo prescribed there 04/09/2013   Arthritis    Chicken pox    Hx of blood clots    jan 2025   Lupus    MCI (mild cognitive impairment) 05/20/2020   Migraines    Prediabetes    Past Surgical History:  Procedure Laterality Date   BACK SURGERY  2000   CHOLECYSTECTOMY     DILATION AND CURETTAGE OF UTERUS  1973   GALLBLADDER SURGERY  1974   Family History  Problem Relation Age of Onset   Arthritis Mother    Diabetes Mother    Heart disease Mother    Heart attack Mother    Pneumonia Father    COPD Sister    Alzheimer's disease Sister    Heart disease Brother    Pulmonary embolism Brother    Alzheimer's disease Brother    Migraines Daughter    Alcoholism Other    Arthritis Other    Hypertension Other    Sudden death Other    Colon cancer Neg Hx    Esophageal cancer Neg Hx    Stomach cancer Neg Hx    Colon polyps Neg Hx    Social History:  reports that she has never smoked. She has never used smokeless tobacco. She reports that she does not currently use alcohol after a past usage of about 2.0 standard drinks of alcohol per week. She reports that she does not use drugs. Allergies:  Allergies  Allergen Reactions   Codeine Other (  See Comments)    Per patient, this made her "flip out"   Medications Prior to Admission  Medication Sig Dispense Refill   ALPRAZolam (XANAX) 0.5 MG tablet Take 0.5 mg by mouth daily as needed for anxiety.     apixaban (ELIQUIS) 5 MG TABS tablet Take 1 tablet (5 mg total) by mouth 2 (two) times daily. 60 tablet 5   diclofenac sodium (VOLTAREN) 1 % GEL Apply 3 gm to 3 large joints up to 3 times a day.Dispense 3 tubes with 3 refills. (Patient taking differently: Apply 3 g topically 3 (three) times daily as needed (for pain- large joints).) 3 Tube 1   donepezil (ARICEPT) 10 MG tablet Take 1 tablet (10 mg total) by mouth at bedtime. 90 tablet 1   Multiple Vitamins-Minerals (MULTIVITAMIN WOMEN 50+)  TABS Take 1 tablet by mouth daily with breakfast.     REFRESH OPTIVE ADVANCED PF 0.5-1-0.5 % SOLN Place 1 drop into both eyes 3 (three) times daily as needed (for irritation).      Home: Home Living Family/patient expects to be discharged to:: Private residence Living Arrangements: Spouse/significant other Available Help at Discharge: Family, Friend(s), Available PRN/intermittently Type of Home: House Home Access: Stairs to enter [employer] of Steps: 3 on the side and 5 in the front one rail on side with brick wall and bil rails in front Entrance Stairs-Rails: Left Home Layout: Two level, Able to live on main level with bedroom/bathroom Bathroom Shower/Tub: Engineer, manufacturing systems: Handicapped height Home Equipment: BSC/3in1, Shower seat Additional Comments: lives with spouse who is visually impaired. Daughter reports she can stay as long as needed  Lives With: Spouse  Functional History: Prior Function Prior Level of Function : Independent/Modified Independent, Driving Mobility Comments: Pt reports no use of AD prior to hospitalization. ADLs Comments: ind Functional Status:  Mobility: Bed Mobility Overal bed mobility: Needs Assistance Bed Mobility: Supine to Sit, Sit to Supine Supine to sit: Min assist, HOB elevated Sit to supine: Min assist, HOB elevated General bed mobility comments: Min assist for trunk support and to guide RUE with turning to EOB. LE support for sequencing back into bed with verbal cues for technique. Transfers Overall transfer level: Needs assistance Equipment used: Rolling walker (2 wheels) Transfers: Sit to/from Stand Sit to Stand: Min assist Bed to/from chair/wheelchair/BSC transfer type:: Step pivot Step pivot transfers: Min assist, Mod assist General transfer comment: Min assist for boost and balance. Multimodal cues for set-up and sequencing prior to rising, RUE assisted to find RW. Tactile cues to turn head and visually locate  RUE and hand grip on RW. Ambulation/Gait Ambulation/Gait assistance: Mod assist Gait Distance (Feet): 12 Feet Assistive device: Rolling walker (2 wheels) Gait Pattern/deviations: Step-through pattern, Step-to pattern, Decreased stride length, Decreased dorsiflexion - right, Shuffle, Ataxic, Drifts right/left, Wide base of support General Gait Details: Educated on safe AD use with RW for support, with effort able to sustain grip with RUE on RW. Poor awareness of RLE placement, ataxic, needing assist with RW control and body placement with BOS of RW. No overt buckling. Mod assist for RW control and balance. Multimodal cues for stepping backwards and sequencing to align with bed. Gait velocity: dec Gait velocity interpretation: <1.31 ft/sec, indicative of household ambulator    ADL: ADL Overall ADL's : Needs assistance/impaired Grooming: Maximal assistance, Cueing for sequencing, Sitting Upper Body Bathing: Maximal assistance, Cueing for sequencing, Sitting Lower Body Bathing: Maximal assistance, Cueing for sequencing, Sitting/lateral leans Upper Body Dressing : Maximal assistance, Cueing  for sequencing, Sitting Lower Body Dressing: Maximal assistance, Cueing for sequencing, Sitting/lateral leans Toilet Transfer: Moderate assistance, Cueing for safety, Cueing for sequencing, BSC/3in1 Toileting- Clothing Manipulation and Hygiene: Maximal assistance, Cueing for safety, Cueing for sequencing, Sitting/lateral lean, Sit to/from stand General ADL Comments: Pt max A for dressing/bathing and feeding due to poor coordination and R side neglect, difficulty sequencing, not aware of deficits, poor balance with transfers.  Cognition: Cognition Overall Cognitive Status: Impaired/Different from baseline Arousal/Alertness: Awake/alert Orientation Level: Oriented to person, Disoriented to situation, Disoriented to time, Disoriented to place Attention: Sustained Sustained Attention: Impaired Sustained  Attention Impairment: Verbal basic, Functional basic Memory: Impaired Memory Impairment: Other (comment) (impacted by aphasia) Awareness: Impaired Cognition Arousal: Alert Behavior During Therapy: WFL for tasks assessed/performed Overall Cognitive Status: Impaired/Different from baseline  Blood pressure 136/62, pulse 68, temperature 97.6 F (36.4 C), temperature source Oral, resp. rate (!) 22, height 5\' 5"  (1.651 m), SpO2 95%. Physical Exam Constitutional:      General: She is not in acute distress. HENT:     Head: Normocephalic.     Right Ear: External ear normal.     Left Ear: External ear normal.     Nose: Nose normal.  Eyes:     Extraocular Movements: Extraocular movements intact.     Pupils: Pupils are equal, round, and reactive to light.  Cardiovascular:     Rate and Rhythm: Normal rate.  Pulmonary:     Effort: Pulmonary effort is normal.  Abdominal:     Palpations: Abdomen is soft.  Musculoskeletal:        General: Normal range of motion.     Cervical back: Normal range of motion.  Skin:    General: Skin is warm.  Neurological:     Mental Status: She is alert.     Comments: Pt is alert, oriented to name, hospital, month/year. Does demonstrate limitations in awareness and insight as well as short term memory had difficulties with left/right discrimination and attempted to perform all activities using LUE during exam today despite repeated verbal and tactile cueing. Right inattention. CN exam was non-focal. MMT: LUE 4+/5. RUE 4/5. LLE 4+/5. RLE 4+/5. Was better with discriminating lower extremities. Sensed pain and light touch in all 4's. No abnl resting tone. Right upper and lower limb ataxia noted.   Psychiatric:        Mood and Affect: Mood normal.        Behavior: Behavior normal.     Results for orders placed or performed during the hospital encounter of 06/08/23 (from the past 24 hours)  Heparin level (unfractionated)     Status: Abnormal   Collection Time:  06/09/23  2:07 PM  Result Value Ref Range   Heparin Unfractionated >1.10 (H) 0.30 - 0.70 IU/mL  Prepare RBC (crossmatch)     Status: None   Collection Time: 06/09/23  5:00 PM  Result Value Ref Range   Order Confirmation      ORDER PROCESSED BY BLOOD BANK Performed at [facility], [address], [place], [place] [address]   APTT     Status: Abnormal   Collection Time: 06/09/23  9:50 PM  Result Value Ref Range   aPTT 37 (H) 24 - 36 seconds  Heparin level (unfractionated)     Status: None   Collection Time: 06/09/23  9:50 PM  Result Value Ref Range   Heparin Unfractionated 0.60 0.30 - 0.70 IU/mL  CBC     Status: Abnormal   Collection Time:  06/10/23  4:10 AM  Result Value Ref Range   WBC 4.4 4.0 - 10.5 K/uL   RBC 3.56 (L) 3.87 - 5.11 MIL/uL   Hemoglobin 9.3 (L) 12.0 - 15.0 g/dL   HCT 30.8 (L) 65.7 - 84.6 %   MCV 80.3 80.0 - 100.0 fL   MCH 26.1 26.0 - 34.0 pg   MCHC 32.5 30.0 - 36.0 g/dL   RDW 96.2 (H) 95.2 - 84.1 %   Platelets 220 150 - 400 K/uL   nRBC 0.0 0.0 - 0.2 %  Basic metabolic panel with GFR     Status: Abnormal   Collection Time: 06/10/23  4:10 AM  Result Value Ref Range   Sodium 137 135 - 145 mmol/L   Potassium 3.9 3.5 - 5.1 mmol/L   Chloride 107 98 - 111 mmol/L   CO2 19 (L) 22 - 32 mmol/L   Glucose, Bld 101 (H) 70 - 99 mg/dL   BUN 10 8 - 23 mg/dL   Creatinine, Ser 3.24 0.44 - 1.00 mg/dL   Calcium 8.8 (L) 8.9 - 10.3 mg/dL   GFR, Estimated >40 >10 mL/min   Anion gap 11 5 - 15  Heparin level (unfractionated)     Status: None   Collection Time: 06/10/23  9:06 AM  Result Value Ref Range   Heparin Unfractionated 0.49 0.30 - 0.70 IU/mL  APTT     Status: Abnormal   Collection Time: 06/10/23  9:06 AM  Result Value Ref Range   aPTT 49 (H) 24 - 36 seconds   CT CHEST WO CONTRAST Result Date: 06/09/2023 CLINICAL DATA:  Pleural mass EXAM: CT CHEST WITHOUT CONTRAST TECHNIQUE: Multidetector CT imaging of the chest was performed following the standard protocol  without IV contrast. RADIATION DOSE REDUCTION: This exam was performed according to the departmental dose-optimization program which includes automated exposure control, adjustment of the mA and/or kV according to patient size and/or use of iterative reconstruction technique. COMPARISON:  03/09/2023 FINDINGS: Cardiovascular: Heart is normal size. Aorta is normal caliber. Small pericardial effusion. Scattered coronary artery and aortic calcifications. Mediastinum/Nodes: Mediastinal adenopathy is stable since prior study. Difficult to assess the hila right without IV contrast, but right hilar fullness is suspicious for adenopathy. No axillary adenopathy. Trachea and esophagus are unremarkable. Thyroid unremarkable. Lungs/Pleura: Medial right middle lobe mass again noted measuring 4.0 x 2.2 cm compared to 2.6 x 1.3 cm on prior study when measured at the same level and in the same planes. This is highly suspicious for primary lung cancer. Anterior right middle lobe nodule measures 8 mm compared to 7 mm previously. Dependent atelectasis or scarring. No effusions. Upper Abdomen: No acute findings Musculoskeletal: Chest wall soft tissues are unremarkable. No acute bony abnormality. IMPRESSION: Medial right middle lobe mass has enlarged since prior study, now measuring approximately 4.0 x 2.2 cm compared to 2.6 x 1.3 cm previously. This is most compatible with primary lung cancer. This could be further evaluated with PET CT. Stable mediastinal adenopathy. Difficult to assess the hila without IV contrast, but right hilar fullness is suspicious for adenopathy. Bibasilar atelectasis or scarring. Small pericardial effusion. Aortic Atherosclerosis (ICD10-I70.0). Electronically Signed   By: Charlett Nose M.D.   On: 06/09/2023 19:34   ECHOCARDIOGRAM COMPLETE BUBBLE STUDY Result Date: 06/09/2023    ECHOCARDIOGRAM REPORT   Patient Name:   Tara Martin Swager Date of Exam: 06/09/2023 Medical Rec #:  [id number]        Height:       65.0 in  Accession #:    [id number]       Weight:       135.0 lb Date of Birth:  Feb 09, 1941        BSA:          1.674 m Patient Age:    82 years         BP:           158/83 mmHg Patient Gender: F                HR:           63 bpm. Exam Location:  Inpatient Procedure: 2D Echo, Color Doppler, Cardiac Doppler and Saline Contrast Bubble            Study (Both Spectral and Color Flow Doppler were utilized during            procedure). Indications:    Stroke 434.91 / I63.9  History:        Patient has prior history of Echocardiogram examinations, most                 recent 03/10/2023.  Sonographer:    Harriette Bouillon RDCS Referring Phys: [phone] DAVID MANUEL ORTIZ IMPRESSIONS  1. Left ventricular ejection fraction, by estimation, is 60 to 65%. Left ventricular ejection fraction by PLAX is 64 %. The left ventricle has normal function. The left ventricle has no regional wall motion abnormalities. Left ventricular diastolic parameters are consistent with Grade I diastolic dysfunction (impaired relaxation).  2. Right ventricular systolic function is normal. The right ventricular size is normal.  3. There is no evidence of cardiac tamponade.  4. The mitral valve is abnormal. Mild mitral valve regurgitation.  5. The aortic valve is tricuspid. Aortic valve regurgitation is not visualized.  6. The inferior vena cava is normal in size with greater than 50% respiratory variability, suggesting right atrial pressure of 3 mmHg.  7. Doppler suspicious of left to right atrial level shunting by color flow Doppler. However, agitated saline contrast bubble study was negative, with no evidence of right to left interatrial shunt. There is a likely a patent foramen ovale with predominantly left to right shunting across the atrial septum. Comparison(s): Changes from prior study are noted. 03/10/2023: LVEF 60-65%. Conclusion(s)/Recommendation(s): Findings suggest possible PFO with left to right shunting, however, no right to left shunting seen by saline  microbubble contrast - even with valsalva. An area of bubble "Washout" was noted at the atrial septum, which could be due to brisk left to right flow. Consider further evaluation with transesophageal echocardiogram and bubble study which would provide more definitive images. FINDINGS  Left Ventricle: Left ventricular ejection fraction, by estimation, is 60 to 65%. Left ventricular ejection fraction by PLAX is 64 %. The left ventricle has normal function. The left ventricle has no regional wall motion abnormalities. The left ventricular internal cavity size was normal in size. There is no left ventricular hypertrophy. Left ventricular diastolic parameters are consistent with Grade I diastolic dysfunction (impaired relaxation). Indeterminate filling pressures. Right Ventricle: The right ventricular size is normal. No increase in right ventricular wall thickness. Right ventricular systolic function is normal. Left Atrium: Left atrial size was normal in size. Right Atrium: Right atrial size was normal in size. Prominent Chiari network. Pericardium: Trivial pericardial effusion is present. The pericardial effusion is circumferential. There is no evidence of cardiac tamponade. Mitral Valve: The mitral valve is abnormal. Mild mitral valve regurgitation. Tricuspid Valve: The tricuspid valve is grossly normal.  Tricuspid valve regurgitation is trivial. Aortic Valve: The aortic valve is tricuspid. Aortic valve regurgitation is not visualized. Pulmonic Valve: The pulmonic valve was normal in structure. Pulmonic valve regurgitation is not visualized. Aorta: The aortic root and ascending aorta are structurally normal, with no evidence of dilitation. Venous: The inferior vena cava is normal in size with greater than 50% respiratory variability, suggesting right atrial pressure of 3 mmHg. IAS/Shunts: The interatrial septum is aneurysmal. Evidence of atrial level shunting detected by color flow Doppler. Agitated saline contrast was  given intravenously to evaluate for intracardiac shunting. Agitated saline contrast bubble study was negative, with no evidence of any interatrial shunt. A moderately sized patent foramen ovale is detected with predominantly left to right shunting across the atrial septum.  LEFT VENTRICLE PLAX 2D LV EF:         Left            Diastology                ventricular     LV e' medial:    5.98 cm/s                ejection        LV E/e' medial:  11.5                fraction by     LV e' lateral:   9.03 cm/s                PLAX is 64      LV E/e' lateral: 7.6                %. LVIDd:         4.10 cm LVIDs:         2.70 cm LV PW:         1.00 cm LV IVS:        1.00 cm LVOT diam:     2.00 cm LV SV:         65 LV SV Index:   39 LVOT Area:     3.14 cm  RIGHT VENTRICLE             IVC RV S prime:     19.90 cm/s  IVC diam: 1.40 cm TAPSE (M-mode): 1.7 cm LEFT ATRIUM           Index        RIGHT ATRIUM           Index LA diam:      3.60 cm 2.15 cm/m   RA Area:     10.10 cm LA Vol (A2C): 38.0 ml 22.70 ml/m  RA Volume:   19.80 ml  11.83 ml/m LA Vol (A4C): 34.8 ml 20.79 ml/m  AORTIC VALVE LVOT Vmax:   86.20 cm/s LVOT Vmean:  59.300 cm/s LVOT VTI:    0.207 m  AORTA Ao Root diam: 3.10 cm Ao Asc diam:  3.20 cm MITRAL VALVE MV Area (PHT): 2.35 cm    SHUNTS MV Decel Time: 323 msec    Systemic VTI:  0.21 m MV E velocity: 68.70 cm/s  Systemic Diam: 2.00 cm MV A velocity: 87.50 cm/s MV E/A ratio:  0.79 Zoila Shutter MD Electronically signed by Zoila Shutter MD Signature Date/Time: 06/09/2023/4:47:04 PM    Final    VAS US CAROTID Result Date: 06/09/2023 Carotid Arterial Duplex Study Patient Name:  Tara Martin  Date of Exam:   06/09/2023 Medical Rec #: [id number]  Accession #:    [id number] Date of Birth: [birth year]         Patient Gender: F Patient Age:   21 years Exam Location:  [facility] Procedure:      VAS US CAROTID Referring Phys: DAVID ORTIZ  --------------------------------------------------------------------------------  Indications:       CVA, Speech disturbance, Numbness and Weakness. Risk Factors:      Hyperlipidemia, no history of smoking. Other Factors:     Late onset Alzheimer's, Lupus, right middle lobe lung mass,                    question malignancy. Comparison Study:  No prior study Performing Technologist: Sherren Kerns RVS  Examination Guidelines: A complete evaluation includes B-mode imaging, spectral Doppler, color Doppler, and power Doppler as needed of all accessible portions of each vessel. Bilateral testing is considered an integral part of a complete examination. Limited examinations for reoccurring indications may be performed as noted.  Right Carotid Findings: +----------+--------+--------+--------+------------------+--------+           PSV cm/sEDV cm/sStenosisPlaque DescriptionComments +----------+--------+--------+--------+------------------+--------+ CCA Prox  98      19                                         +----------+--------+--------+--------+------------------+--------+ CCA Distal78      16                                         +----------+--------+--------+--------+------------------+--------+ ICA Prox  75      27              heterogenous               +----------+--------+--------+--------+------------------+--------+ ICA Mid   82      27                                         +----------+--------+--------+--------+------------------+--------+ ICA Distal130     33                                tortuous +----------+--------+--------+--------+------------------+--------+ ECA       77      9                                          +----------+--------+--------+--------+------------------+--------+ +----------+--------+-------+--------+-------------------+           PSV cm/sEDV cmsDescribeArm Pressure (mmHG)  +----------+--------+-------+--------+-------------------+ Subclavian101                                        +----------+--------+-------+--------+-------------------+ +---------+--------+--+--------+--+ VertebralPSV cm/s60EDV cm/s17 +---------+--------+--+--------+--+  Left Carotid Findings: +----------+--------+--------+--------+------------------+--------+           PSV cm/sEDV cm/sStenosisPlaque DescriptionComments +----------+--------+--------+--------+------------------+--------+ CCA Prox  84      16                                         +----------+--------+--------+--------+------------------+--------+  CCA Distal70      17                                         +----------+--------+--------+--------+------------------+--------+ ICA Prox  38      10              heterogenous               +----------+--------+--------+--------+------------------+--------+ ICA Mid   89      26                                         +----------+--------+--------+--------+------------------+--------+ ICA Distal79      25                                         +----------+--------+--------+--------+------------------+--------+ ECA       114     11                                         +----------+--------+--------+--------+------------------+--------+ +----------+--------+--------+--------+-------------------+           PSV cm/sEDV cm/sDescribeArm Pressure (mmHG) +----------+--------+--------+--------+-------------------+ [id number]                                          +----------+--------+--------+--------+-------------------+ +---------+--------+--+--------+--+ VertebralPSV cm/s41EDV cm/s15 +---------+--------+--+--------+--+   Summary: Right Carotid: The extracranial vessels were near-normal with only minimal wall                thickening or plaque. Left Carotid: The extracranial vessels were near-normal with only minimal wall                thickening or plaque. Vertebrals:  Bilateral vertebral arteries demonstrate antegrade flow. Subclavians: Normal flow hemodynamics were seen in bilateral subclavian              arteries. *See table(s) above for measurements and observations.     Preliminary    CT ANGIO HEAD NECK W WO CM Result Date: 06/09/2023 CLINICAL DATA:  83 year old female neurologic deficit. Multifocal infarcts in the anterior and posterior circulation on MRI yesterday. EXAM: CT ANGIOGRAPHY HEAD AND NECK WITH AND WITHOUT CONTRAST TECHNIQUE: Multidetector CT imaging of the head and neck was performed using the standard protocol during bolus administration of intravenous contrast. Multiplanar CT image reconstructions and MIPs were obtained to evaluate the vascular anatomy. Carotid stenosis measurements (when applicable) are obtained utilizing NASCET criteria, using the distal internal carotid diameter as the denominator. RADIATION DOSE REDUCTION: This exam was performed according to the departmental dose-optimization program which includes automated exposure control, adjustment of the mA and/or kV according to patient size and/or use of iterative reconstruction technique. CONTRAST:  60mL OMNIPAQUE IOHEXOL 350 MG/ML SOLN COMPARISON:  Brain MRI and head CT yesterday. CTA chest 03/09/2023. FINDINGS: CT HEAD Brain: Hypodense cytotoxic edema most pronounced in the left PCA territory, stable there from the CT yesterday. Increased visualization of cytotoxic edema in the left thalamus since yesterday (series 5, image 14). Other small areas of abnormal diffusion  on MRI remain occult by CT. No acute intracranial hemorrhage identified. No acute intracranial hemorrhage identified. Stable ventricle size and configuration. Normal basilar cisterns. Calvarium and skull base: Intact, stable. Paranasal sinuses: Visualized paranasal sinuses and mastoids are stable and well aerated. Orbits: No acute orbit or scalp soft tissue finding. CTA NECK Skeleton:  Cervical spine degeneration. No acute osseous abnormality identified. Upper chest: Paraseptal emphysema or scattered pneumatocele is. Abnormal bulky superior mediastinal lymphadenopathy, appears malignant (series 10, image 168) and progressed since January. Suspected primary right middle lobe lung mass at that time not included today. Other neck: Thoracic inlet metastatic lymphadenopathy, bilateral lower neck level 4 stations is progressed since January (series 10, image 141). No other metastatic disease identified in the neck. Aortic arch: 3 vessel arch.  No significant arch atherosclerosis. Right carotid system: Tortuosity. Patent with no significant plaque or stenosis through the right carotid bifurcation. 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 The saccular component is also roughly 8 mm. No associated stenosis. Left carotid system: Tortuous left CCA and patent left carotid bifurcation without stenosis. However, attenuated right ICA distal to the bulb, circumferentially narrow due to 2-3 mm diameter. This remains patent to the skull base, with a small area of fusiform aneurysmal dilatation measuring 5-6 mm just below the skull base series 17, image 30. Vertebral arteries: Proximal subclavian arteries, cervical vertebral arteries are patent with tortuosity, no significant plaque or stenosis to the skull base. CTA HEAD Posterior circulation: Right vertebral artery is mildly dominant throughout. Patent distal vertebral arteries and vertebrobasilar junction with tortuosity but no significant plaque or stenosis. Patent PICA origins. Patent basilar artery without stenosis. Patent SCA and left PCA origins. Fetal type right PCA origin. Prominent left posterior communicating artery also, which in light of the below left ICA siphon findings is probably collateral supply  to the left ICA terminus. Left PCA is severely irregular and attenuated beginning at the junction of the P1 and posterior communicating artery, series 15, image 20, and P3 branches are poorly enhancing. Contralateral right PCA branches appear more normal, mildly irregular. Anterior circulation:  Both ICA siphons are patent. But the left petrous ICA is highly irregular and stenotic as seen on series 12, image 126, with probable soft plaque (series 14, image 152). Left ICA remains patent, appears more normal in the cavernous and supraclinoid segments. Contralateral right ICA siphon is ectatic throughout, patent without stenosis. Both posterior communicating artery origins are normal. Patent carotid termini, MCA and ACA origins. Left ACA A1 is dominant and mildly irregular. Anterior communicating artery is diminutive but patent. Bilateral ACA branches are patent with mild irregularity. Right MCA M1 segment and bifurcation are patent without stenosis. Left MCA M1 segment and bifurcation are patent without stenosis. Bilateral MCA branches are patent with mild irregularity. Venous sinuses: Patent. Anatomic variants: Dominant right vertebral artery, right ACA A1, fetal type right PCA origin. Review of the MIP images confirms the above findings IMPRESSION: 1. Progressed since January and bulky Metastatic Lymphadenopathy in the visible mediastinum, bilateral lower neck/thoracic inlet. 2. Positive for Left PCA severe stenosis, ultimately occluded in the P3 segments, concordant with acute ischemia demonstrated by MRI. 3. Positive also for highly abnormal bilateral Internal Carotid Arteries: - Attenuated, Stenotic cervical Left ICA and Left siphon, including High-grade stenosis in the petrous segment. Evidence of collateral supply to  the Left ICA terminus from the posterior communicating artery, posterior circulation. 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 4. No other hemodynamically significant arterial stenosis in the head or neck. 5. Expected CT appearance of ischemia demonstrated by MRI yesterday. No hemorrhagic transformation or intracranial mass effect. Electronically Signed   By: Odessa Fleming M.D.   On: 06/09/2023 09:21   MR BRAIN WO CONTRAST Result Date: 06/08/2023 CLINICAL DATA:  Acute neurologic deficit EXAM: MRI HEAD WITHOUT CONTRAST TECHNIQUE: Multiplanar, multiecho pulse sequences of the brain and surrounding structures were obtained without intravenous contrast. COMPARISON:  None Available. FINDINGS: Brain: Multifocal abnormal diffusion restriction, including the left occipital lobe, left temporal lobe, left thalamus and within the bilateral parietal subcortical white matter. Petechial hemorrhage within the posterior right hemisphere. There is multifocal hyperintense T2-weighted signal within the white matter. Generalized volume loss. The midline structures are normal. Vascular: Normal flow voids. Skull and upper cervical spine: Normal calvarium and skull base. Visualized upper cervical spine and soft tissues are normal. Sinuses/Orbits:No paranasal sinus fluid levels or advanced mucosal thickening. No mastoid or middle ear effusion. Normal orbits. IMPRESSION: 1. Multifocal acute ischemia, including the left occipital lobe, left temporal lobe, left thalamus and within the bilateral parietal subcortical white matter. No hemorrhage or mass effect. 2. Petechial hemorrhage within the posterior right hemisphere. Electronically Signed   By: Deatra Robinson M.D.   On: 06/08/2023 20:06   CT Head Wo Contrast Addendum Date: 06/08/2023 ADDENDUM REPORT: 06/08/2023 16:43 ADDENDUM: These results were called by telephone at the time of interpretation on 06/08/2023 at 4:37 pm to provider Dr. Rodena Medin, who verbally acknowledged these results. Electronically Signed   By: Darliss Cheney M.D.   On: 06/08/2023 16:43    Result Date: 06/08/2023 CLINICAL DATA:  Mental status change EXAM: CT HEAD WITHOUT CONTRAST TECHNIQUE: Contiguous axial images were obtained from the base of the skull through the vertex without intravenous contrast. RADIATION DOSE REDUCTION: This exam was performed according to the departmental dose-optimization program which includes automated exposure control, adjustment of the mA and/or kV according to patient size and/or use of iterative reconstruction technique. COMPARISON:  MRI brain 02/24/2021.  Head CT 02/02/2020. FINDINGS: Brain: Focal area of hypodensity with loss of gray-white matter distinction is seen in the left occipital lobe, new from prior. No significant mass effect or midline shift, but there is associated sulcal effacement. No acute intracranial hemorrhage or hydrocephalus. There is mild diffuse atrophy. There is a small old cortical infarct in the high right parietal region which is new from 2021. Vascular: No hyperdense vessel or unexpected calcification. Skull: Normal. Negative for fracture or focal lesion. Sinuses/Orbits: No acute finding. Other: None. IMPRESSION: 1. Acute to subacute infarct in the left occipital lobe, new from prior. No significant mass effect or midline shift. No acute hemorrhage. 2. Small old cortical infarct in the high right parietal region, new from 2021. Electronically Signed: By: Darliss Cheney M.D. On: 06/08/2023 16:32   CT Cervical Spine Wo Contrast Result Date: 06/08/2023 CLINICAL DATA:  Trauma EXAM: CT CERVICAL SPINE WITHOUT CONTRAST TECHNIQUE: Multidetector CT imaging of the cervical spine was performed without intravenous contrast. Multiplanar CT image reconstructions were also generated. RADIATION DOSE REDUCTION: This exam was performed according to the departmental dose-optimization program which includes automated exposure control, adjustment of the mA and/or kV according to patient size and/or use of iterative reconstruction technique. COMPARISON:  Cervical spine CT 02/02/2020 FINDINGS: Alignment: There is 2 mm of anterolisthesis at C7-T1 which is favored is degenerative and unchanged. Alignment is otherwise anatomic. Skull base and vertebrae: No acute fracture. No primary bone lesion or focal pathologic process. Soft tissues and spinal canal: No prevertebral fluid or swelling. No visible canal hematoma. Disc levels: There is moderate severe disc space narrowing and endplate osteophyte formation at C4-C5, C5-C6 and C6-C7 compatible with degenerative change. There is diffuse bilateral facet arthropathy causing mild neural foraminal stenosis most significant at C3-C4, C4-C5 and C5-C6 on right. Posterior disc osteophyte complexes are seen at C4-C5, C5-C6 and C6-C7 causing mild central canal stenosis. Upper chest: Negative. Other: None. IMPRESSION: 1. No acute fracture or traumatic subluxation of the cervical spine. 2. Multilevel degenerative changes of the cervical spine. Electronically Signed   By: Darliss Cheney M.D.   On: 06/08/2023 16:36    Assessment/Plan: Diagnosis: 83 year old female status post multifocal infarcts concerning for cardioembolic source Does the need for close, 24 hr/day medical supervision in concert with the patient's rehab needs make it unreasonable for this patient to be served in a less intensive setting? Yes Co-Morbidities requiring supervision/potential complications:  -Right middle lobe lung mass pending biopsy -Hypertension -Dementia -Hx of PE, DVT Due to bladder management, bowel management, safety, skin/wound care, disease management, medication administration, pain management, and patient education, does the patient require 24 hr/day rehab nursing? Yes Does the patient require coordinated care of a physician, rehab nurse, therapy disciplines of PT, OT, SLP to address physical and functional deficits in the context of the above medical diagnosis(es)? Yes Addressing deficits in the following areas: balance, endurance,  locomotion, strength, transferring, bowel/bladder control, bathing, dressing, feeding, grooming, toileting, cognition, and psychosocial support Can the patient actively participate in an intensive therapy program of at least 3 hrs of therapy per day at least 5 days per week? Yes The potential for patient to make measurable gains while on inpatient rehab is good Anticipated functional outcomes upon discharge from inpatient rehab are supervision  with PT, supervision with OT, supervision with SLP. Estimated rehab length of stay to reach the above functional goals is: 12-17 days Anticipated discharge destination: Home Overall Rehab/Functional Prognosis: excellent  POST ACUTE RECOMMENDATIONS: This patient's condition is appropriate for continued rehabilitative care in the following setting: CIR Patient has agreed to participate in recommended program. Yes Note that insurance prior authorization may be required for reimbursement for recommended care.  Comment: Husband is legally blind but can provide supervision and some hands on assist. Home is accessible. Rehab Admissions Coordinator to follow up.       I have personally performed a face to face diagnostic evaluation of this patient. Additionally, I have examined the patient's medical record including any pertinent labs and radiographic images.    Thanks,  [name], MD 06/10/2023

## 2023-06-11 DIAGNOSIS — I639 Cerebral infarction, unspecified: Secondary | ICD-10-CM | POA: Diagnosis not present

## 2023-06-11 LAB — BASIC METABOLIC PANEL WITH GFR
Anion gap: 11 (ref 5–15)
BUN: 14 mg/dL (ref 8–23)
CO2: 20 mmol/L — ABNORMAL LOW (ref 22–32)
Calcium: 8.8 mg/dL — ABNORMAL LOW (ref 8.9–10.3)
Chloride: 106 mmol/L (ref 98–111)
Creatinine, Ser: 0.93 mg/dL (ref 0.44–1.00)
GFR, Estimated: >60 mL/min (ref >60)
Glucose, Bld: 106 mg/dL — ABNORMAL HIGH (ref 70–99)
Potassium: 4 mmol/L (ref 3.5–5.1)
Sodium: 137 mmol/L (ref 135–145)

## 2023-06-11 LAB — CBC
HCT: 28.6 % — ABNORMAL LOW (ref 36.0–46.0)
HCT: 29 % — ABNORMAL LOW (ref 36.0–46.0)
Hemoglobin: 9.3 g/dL — ABNORMAL LOW (ref 12.0–15.0)
Hemoglobin: 9.4 g/dL — ABNORMAL LOW (ref 12.0–15.0)
MCH: 26.1 pg (ref 26.0–34.0)
MCH: 26 pg (ref 26.0–34.0)
MCHC: 32.5 g/dL (ref 30.0–36.0)
MCHC: 32.4 g/dL (ref 30.0–36.0)
MCV: 80.3 fL (ref 80.0–100.0)
MCV: 80.1 fL (ref 80.0–100.0)
Platelets: 220 10*3/uL (ref 150–400)
Platelets: 240 10*3/uL (ref 150–400)
RBC: 3.56 MIL/uL — ABNORMAL LOW (ref 3.87–5.11)
RBC: 3.62 MIL/uL — ABNORMAL LOW (ref 3.87–5.11)
RDW: 15.8 % — ABNORMAL HIGH (ref 11.5–15.5)
RDW: 16.2 % — ABNORMAL HIGH (ref 11.5–15.5)
WBC: 4.4 10*3/uL (ref 4.0–10.5)
WBC: 4.5 10*3/uL (ref 4.0–10.5)
nRBC: 0 % (ref 0.0–0.2)
nRBC: 0 % (ref 0.0–0.2)

## 2023-06-11 NOTE — Plan of Care (Signed)
  Problem: Ischemic Stroke/TIA Tissue Perfusion: Goal: Complications of ischemic stroke/TIA will be minimized Outcome: Progressing   Problem: Self-Care: Goal: Ability to communicate needs accurately will improve Outcome: Progressing   Problem: Nutrition: Goal: Risk of aspiration will decrease Outcome: Progressing   Problem: Clinical Measurements: Goal: Ability to maintain clinical measurements within normal limits will improve Outcome: Progressing Goal: Will remain free from infection Outcome: Progressing Goal: Diagnostic test results will improve Outcome: Progressing Goal: Respiratory complications will improve Outcome: Progressing Goal: Cardiovascular complication will be avoided Outcome: Progressing   Problem: Activity: Goal: Risk for activity intolerance will decrease Outcome: Progressing   Problem: Nutrition: Goal: Adequate nutrition will be maintained Outcome: Progressing   Problem: Elimination: Goal: Will not experience complications related to bowel motility Outcome: Progressing Goal: Will not experience complications related to urinary retention Outcome: Progressing   Problem: Pain Managment: Goal: General experience of comfort will improve and/or be controlled Outcome: Progressing   Problem: Safety: Goal: Ability to remain free from injury will improve Outcome: Progressing   Problem: Skin Integrity: Goal: Risk for impaired skin integrity will decrease Outcome: Progressing

## 2023-06-11 NOTE — Plan of Care (Signed)
  Problem: Ischemic Stroke/TIA Tissue Perfusion: Goal: Complications of ischemic stroke/TIA will be minimized Outcome: Not Progressing   Problem: Coping: Goal: Will verbalize positive feelings about self Outcome: Not Progressing Goal: Will identify appropriate support needs Outcome: Not Progressing   Problem: Health Behavior/Discharge Planning: Goal: Ability to manage health-related needs will improve Outcome: Not Progressing Goal: Goals will be collaboratively established with patient/family Outcome: Not Progressing   Problem: Self-Care: Goal: Ability to participate in self-care as condition permits will improve Outcome: Not Progressing Goal: Verbalization of feelings and concerns over difficulty with self-care will improve Outcome: Not Progressing Goal: Ability to communicate needs accurately will improve Outcome: Not Progressing   Problem: Nutrition: Goal: Risk of aspiration will decrease Outcome: Not Progressing Goal: Dietary intake will improve Outcome: Not Progressing   Problem: Nutrition: Goal: Adequate nutrition will be maintained Outcome: Not Progressing

## 2023-06-11 NOTE — Progress Notes (Signed)
 PROGRESS NOTE    Tara Martin  WUJ:[id number] DOB: 05-01-1940 DOA: 06/08/2023 PCP: Kristian Covey, MD    Chief Complaint  Patient presents with   Weakness   Aphasia   Altered Mental Status    Brief Narrative:    Tara Martin is a 83 y.o. female with medical history significant of anxiety, osteoarthritis, varicella-zoster, systemic lupus erythematosus, mild cognitive impairment, late onset Alzheimer's, migraine headaches, prediabetes who was admitted at the beginning of the year with pulmonary embolism and diagnosed with right middle lobe mass who is presenting to the emergency department via EMS with progressively worse mental status associated with right-sided weakness, confusion and unsteady gait, her workup significant for acute CVA, admitted for further workup   Assessment & Plan:   Principal Problem:   Acute CVA (cerebrovascular accident) (HCC) Active Problems:   SLE (systemic lupus erythematosus related syndrome) (HCC)   Prediabetes   Pulmonary embolism (HCC)   Moderate late onset Alzheimer's dementia without behavioral disturbance, psychotic disturbance, mood disturbance, or anxiety (HCC)   Normocytic anemia   Acute CVA (cerebrovascular accident) (HCC) -Patient MRI significant for acute CVA, most likely of cardioembolic source, it is concerning that it happened while on Eliquis, which may indicate hypercoagulable status  (please see discussion below under pulmonary mass) -PT, OT, SLP consulted, recommendation for CIR -started on heparin GTT, stroke protocol, currently on full dose Lovenox, will hold night before bronchoscopy.   Moderate late onset Alzheimer's dementia   without behavioral disturbance,  psychotic disturbance, mood disturbance, or anxiety (HCC) Continue donezepil 10 mg p.o. at bedtime. Supportive care.   SLE (systemic lupus erythematosus related syndrome) (HCC) Does not seem to be active. She is not currently on medications.     Prediabetes A1c is 5.5  Pulmonary mass - PCCM consulted, navigational bronchoscopy on Tuesday, hold Lovenox today before   Pulmonary embolism (HCC) The patient has been on apixaban twice daily.  Heparin GTT>> full dose Lovenox   Normocytic anemia Low B12 level -Hemoglobin 7.3, transfused 1 unit, appropriate response, hemoglobin is stable. - workup significant for B12 of 273, ferritin borderline low at 31 but will hold on IV iron for now.    DVT prophylaxis: Heparin GTT Code Status: (Full) Family Communication: None at bedside today Disposition: Will need CIR, after her bronchoscopy on Tuesday  Status is: Inpatient    Consultants:  Neurology [facility]  Subjective: No significant events overnight, she denies any complaints  Objective: Vitals:   06/10/23 2334 06/11/23 0251 06/11/23 0400 06/11/23 0754  BP: (!) 152/70 (!) 163/70 129/60 127/61  Pulse: 66 68 (!) 57 72  Resp: 16 19 18  (!) 21  Temp: 98.4 F (36.9 C)   97.6 F (36.4 C)  TempSrc: Oral Oral  Axillary  SpO2: 98% 98% 94% 98%  Weight:      Height:        Intake/Output Summary (Last 24 hours) at 06/11/2023 1315 Last data filed at 06/11/2023 0229 Gross per 24 hour  Intake 417.46 ml  Output --  Net 417.46 ml   Filed Weights   06/10/23 1309 06/10/23 2205  Weight: 56.9 kg 58.3 kg    Examination:  Awake Alert, Oriented X 2, she has mild dementia at baseline, but overall present Symmetrical Chest wall movement, Good air movement bilaterally, CTAB RRR,No Gallops,Rubs or new Murmurs, No Parasternal Heave +ve B.Sounds, Abd Soft, No tenderness, No rebound - guarding or rigidity. No Cyanosis, Clubbing or edema, No new Rash or bruise  Data Reviewed: I have personally reviewed following labs and imaging studies  CBC: Recent Labs  Lab 06/08/23 1305 06/08/23 1317 06/09/23 0422 06/10/23 0410 06/11/23 0529  WBC 5.8  --  5.0 4.4 4.5  NEUTROABS 4.0  --   --   --   --   HGB 8.0* 7.8* 7.3* 9.3* 9.4*   HCT 26.8* 23.0* 23.9* 28.6* 29.0*  MCV 81.5  --  79.9* 80.3 80.1  PLT 236  --  203 220 240    Basic Metabolic Panel: Recent Labs  Lab 06/08/23 1305 06/08/23 1317 06/09/23 0422 06/10/23 0410 06/11/23 0529  NA 135 138 135 137 137  K 3.3* 3.5 3.6 3.9 4.0  CL 104 107 106 107 106  CO2 20*  --  19* 19* 20*  GLUCOSE 105* 99 90 101* 106*  BUN 16 13 11 10 14   CREATININE 0.84 0.80 0.79 0.90 0.93  CALCIUM 9.2  --  8.6* 8.8* 8.8*  MG 2.1  --   --   --   --   PHOS 3.7  --   --   --   --     GFR: Estimated Creatinine Clearance: 42.7 mL/min (by C-G formula based on SCr of 0.93 mg/dL).  Liver Function Tests: Recent Labs  Lab 06/08/23 1305 06/09/23 0422  AST 22 19  ALT 11 10  ALKPHOS 75 65  BILITOT 0.6 0.3  PROT 7.8 6.2*  ALBUMIN 3.7 2.9*    CBG: Recent Labs  Lab 06/08/23 1246 06/08/23 2109  GLUCAP 90 89     No results found for this or any previous visit (from the past 240 hours).       Radiology Studies: CT CHEST WO CONTRAST Result Date: 06/09/2023 CLINICAL DATA:  Pleural mass EXAM: CT CHEST WITHOUT CONTRAST TECHNIQUE: Multidetector CT imaging of the chest was performed following the standard protocol without IV contrast. RADIATION DOSE REDUCTION: This exam was performed according to the departmental dose-optimization program which includes automated exposure control, adjustment of the mA and/or kV according to patient size and/or use of iterative reconstruction technique. COMPARISON:  03/09/2023 FINDINGS: Cardiovascular: Heart is normal size. Aorta is normal caliber. Small pericardial effusion. Scattered coronary artery and aortic calcifications. Mediastinum/Nodes: Mediastinal adenopathy is stable since prior study. Difficult to assess the hila right without IV contrast, but right hilar fullness is suspicious for adenopathy. No axillary adenopathy. Trachea and esophagus are unremarkable. Thyroid unremarkable. Lungs/Pleura: Medial right middle lobe mass again noted  measuring 4.0 x 2.2 cm compared to 2.6 x 1.3 cm on prior study when measured at the same level and in the same planes. This is highly suspicious for primary lung cancer. Anterior right middle lobe nodule measures 8 mm compared to 7 mm previously. Dependent atelectasis or scarring. No effusions. Upper Abdomen: No acute findings Musculoskeletal: Chest wall soft tissues are unremarkable. No acute bony abnormality. IMPRESSION: Medial right middle lobe mass has enlarged since prior study, now measuring approximately 4.0 x 2.2 cm compared to 2.6 x 1.3 cm previously. This is most compatible with primary lung cancer. This could be further evaluated with PET CT. Stable mediastinal adenopathy. Difficult to assess the hila without IV contrast, but right hilar fullness is suspicious for adenopathy. Bibasilar atelectasis or scarring. Small pericardial effusion. Aortic Atherosclerosis (ICD10-I70.0). Electronically Signed   By: Charlett Nose M.D.   On: 06/09/2023 19:34        Scheduled Meds:  aspirin EC  81 mg Oral Daily   atorvastatin  20 mg Oral Daily  donepezil  10 mg Oral QHS   enoxaparin (LOVENOX) injection  55 mg Subcutaneous Q12H   feeding supplement  237 mL Oral BID BM   pantoprazole  40 mg Oral Daily   Continuous Infusions:     LOS: 3 days       Huey Bienenstock, MD [facility]   To contact the attending provider between 7A-7P or the covering provider during after hours 7P-7A, please log into the web site [email or web address] and access using universal [place] password for that web site. If you do not have the password, please call the hospital operator.  06/11/2023, 1:15 PM

## 2023-06-11 NOTE — Progress Notes (Signed)
 Occupational Therapy Treatment Patient Details Name: Tara Martin MRN: [id number] DOB: [birth year] Today's Date: 06/11/2023   History of present illness 83 y.o female presenting to the emergency department 4/2 with progressively worse mental status associated with right-sided weakness, confusion and unsteady gait.  MRI shows multifocal infarcts-acute to subacute involving the left PCA territory (open left occipital lobe, left thalamus), bilateral parietal lobes and bilateral cerebellar infarcts. PMH significant of anxiety, osteoarthritis, varicella-zoster, systemic lupus erythematosus, mild cognitive impairment, late onset Alzheimer's, migraine headaches, prediabetes.   OT comments  Patient received in supine and agreeable to OT treatment. Patient was min assist to get to EOB due to assistance with trunk. Patient performed standing tasks to simulate standing ADLs with min to CGA for balance. Patient performed AROM to BUE to address coordination and motor control. Patient able to manage cup with RUE but required cues to locate items on table. Patient will benefit from intensive inpatient follow-up therapy, >3 hours/day. Acute OT to continue to follow to address established goals to facilitate DC to next venue of care.      If plan is discharge home, recommend the following:  A lot of help with walking and/or transfers;A lot of help with bathing/dressing/bathroom;Assistance with cooking/housework;Assist for transportation;Help with stairs or ramp for entrance;Direct supervision/assist for medications management;Assistance with feeding   Equipment Recommendations  Other (comment) (TBD)    Recommendations for Other Services      Precautions / Restrictions Precautions Precautions: Fall Recall of Precautions/Restrictions: Impaired Restrictions Weight Bearing Restrictions Per Provider Order: No       Mobility Bed Mobility Overal bed mobility: Needs Assistance Bed Mobility: Supine to Sit      Supine to sit: Min assist, HOB elevated     General bed mobility comments: able to move BLEs off EOB and assistance with raising trunk    Transfers Overall transfer level: Needs assistance Equipment used: Rolling walker (2 wheels) Transfers: Sit to/from Stand, Bed to chair/wheelchair/BSC Sit to Stand: Min assist     Step pivot transfers: Min assist     General transfer comment: cues for hand placement and safety     Balance Overall balance assessment: Needs assistance Sitting-balance support: No upper extremity supported, Feet supported Sitting balance-Leahy Scale: Fair Sitting balance - Comments: EOB   Standing balance support: Single extremity supported, During functional activity, Reliant on assistive device for balance Standing balance-Leahy Scale: Poor Standing balance comment: CGA to min assist for balance while performing reaching tasks and balance activities                           ADL either performed or assessed with clinical judgement   ADL Overall ADL's : Needs assistance/impaired Eating/Feeding: Supervision/ safety;Sitting Eating/Feeding Details (indicate cue type and reason): able to manage cup with RUE Grooming: Minimal assistance;Standing   Upper Body Bathing: Moderate assistance;Standing               Toilet Transfer: Minimal assistance;Rolling walker (2 wheels) Toilet Transfer Details (indicate cue type and reason): simulated           General ADL Comments: Patient stated she performed bathing earlier and performed simulated bathing and grooming while standing    Extremity/Trunk Assessment              Vision       Perception     Praxis     Communication     Cognition Arousal: Alert Behavior During Therapy: [facility] for tasks  assessed/performed Cognition: Cognition impaired   Orientation impairments: Time     Attention impairment (select first level of impairment): Focused attention Executive functioning  impairment (select all impairments): Sequencing, Reasoning, Problem solving, Organization, Initiation OT - Cognition Comments: cues for vision and corrdination of RUE                 Following commands: Impaired Following commands impaired: Only follows one step commands consistently      Cueing   Cueing Techniques: Verbal cues, Gestural cues, Visual cues  Exercises Exercises: General Upper Extremity General Exercises - Upper Extremity Shoulder Flexion: AROM, Both, 10 reps, Seated Shoulder Horizontal ABduction: AROM, Both, 10 reps, Seated    Shoulder Instructions       General Comments VSS on RA    Pertinent Vitals/ Pain       Pain Assessment Pain Assessment: No/denies pain  Home Living                                          Prior Functioning/Environment              Frequency  Min 2X/week        Progress Toward Goals  OT Goals(current goals can now be found in the care plan section)  Progress towards OT goals: Progressing toward goals  Acute Rehab OT Goals Patient Stated Goal: get better OT Goal Formulation: With patient/family Time For Goal Achievement: 06/23/23 Potential to Achieve Goals: Good ADL Goals Pt Will Perform Upper Body Dressing: with min assist;sitting Pt Will Perform Lower Body Dressing: with min assist;sitting/lateral leans;sit to/from stand Pt Will Transfer to Toilet: with contact guard assist;bedside commode Pt Will Perform Toileting - Clothing Manipulation and hygiene: with min assist;sitting/lateral leans;sit to/from stand Additional ADL Goal #1: Pt will be able to complete grooming tasks sitting with set up/supervision, verbal cues as needed, using visual scanning to reduce R side neglect  Plan      Co-evaluation                 AM-PAC OT "6 Clicks" Daily Activity     Outcome Measure   Help from another person eating meals?: A Little Help from another person taking care of personal grooming?: A  Lot Help from another person toileting, which includes using toliet, bedpan, or urinal?: A Lot Help from another person bathing (including washing, rinsing, drying)?: A Lot Help from another person to put on and taking off regular upper body clothing?: A Lot Help from another person to put on and taking off regular lower body clothing?: A Lot 6 Click Score: 13    End of Session Equipment Utilized During Treatment: Gait belt;Rolling walker (2 wheels)  OT Visit Diagnosis: Unsteadiness on feet (R26.81);Other abnormalities of gait and mobility (R26.89);Muscle weakness (generalized) (M62.81);Hemiplegia and hemiparesis;Other symptoms and signs involving cognitive function;Other symptoms and signs involving the nervous system (R29.898) Hemiplegia - Right/Left: Right Hemiplegia - dominant/non-dominant: Dominant   Activity Tolerance Patient tolerated treatment well   Patient Left in chair;with call bell/phone within reach;with chair alarm set;with family/visitor present   Nurse Communication Mobility status        Time: 2130-8657 OT Time Calculation (min): 32 min  Charges: OT General Charges $OT Visit: 1 Visit OT Treatments $Therapeutic Activity: 8-22 mins $Therapeutic Exercise: 8-22 mins  Alfonse Flavors, OTA Acute Rehabilitation Services  Office [phone]   Dewain Penning 06/11/2023, 2:31  PM

## 2023-06-12 DIAGNOSIS — I639 Cerebral infarction, unspecified: Secondary | ICD-10-CM | POA: Diagnosis not present

## 2023-06-12 LAB — BASIC METABOLIC PANEL WITH GFR
Anion gap: 10 (ref 5–15)
BUN: 15 mg/dL (ref 8–23)
CO2: 21 mmol/L — ABNORMAL LOW (ref 22–32)
Calcium: 8.9 mg/dL (ref 8.9–10.3)
Chloride: 107 mmol/L (ref 98–111)
Creatinine, Ser: 1.01 mg/dL — ABNORMAL HIGH (ref 0.44–1.00)
GFR, Estimated: 56 mL/min — ABNORMAL LOW (ref >60)
Glucose, Bld: 97 mg/dL (ref 70–99)
Potassium: 3.8 mmol/L (ref 3.5–5.1)
Sodium: 138 mmol/L (ref 135–145)

## 2023-06-12 LAB — CBC
HCT: 30.3 % — ABNORMAL LOW (ref 36.0–46.0)
Hemoglobin: 9.5 g/dL — ABNORMAL LOW (ref 12.0–15.0)
MCH: 25.8 pg — ABNORMAL LOW (ref 26.0–34.0)
MCHC: 31.4 g/dL (ref 30.0–36.0)
MCV: 82.3 fL (ref 80.0–100.0)
Platelets: 283 10*3/uL (ref 150–400)
RBC: 3.68 MIL/uL — ABNORMAL LOW (ref 3.87–5.11)
RDW: 16.4 % — ABNORMAL HIGH (ref 11.5–15.5)
WBC: 4.9 10*3/uL (ref 4.0–10.5)
nRBC: 0 % (ref 0.0–0.2)

## 2023-06-12 NOTE — Plan of Care (Signed)
  Problem: Education: Goal: Knowledge of disease or condition will improve Outcome: Progressing Goal: Knowledge of secondary prevention will improve (MUST DOCUMENT ALL) Outcome: Progressing Goal: Knowledge of patient specific risk factors will improve (DELETE if not current risk factor) Outcome: Progressing   Problem: Ischemic Stroke/TIA Tissue Perfusion: Goal: Complications of ischemic stroke/TIA will be minimized Outcome: Progressing   Problem: Coping: Goal: Will verbalize positive feelings about self Outcome: Progressing Goal: Will identify appropriate support needs Outcome: Progressing   Problem: Health Behavior/Discharge Planning: Goal: Ability to manage health-related needs will improve Outcome: Progressing Goal: Goals will be collaboratively established with patient/family Outcome: Progressing   Problem: Self-Care: Goal: Ability to participate in self-care as condition permits will improve Outcome: Progressing Goal: Verbalization of feelings and concerns over difficulty with self-care will improve Outcome: Progressing Goal: Ability to communicate needs accurately will improve Outcome: Progressing   Problem: Nutrition: Goal: Risk of aspiration will decrease Outcome: Progressing Goal: Dietary intake will improve Outcome: Progressing   Problem: Education: Goal: Knowledge of General Education information will improve Description: Including pain rating scale, medication(s)/side effects and non-pharmacologic comfort measures Outcome: Progressing   Problem: Health Behavior/Discharge Planning: Goal: Ability to manage health-related needs will improve Outcome: Progressing   Problem: Clinical Measurements: Goal: Ability to maintain clinical measurements within normal limits will improve Outcome: Progressing Goal: Will remain free from infection Outcome: Progressing Goal: Diagnostic test results will improve Outcome: Progressing Goal: Respiratory complications will  improve Outcome: Progressing Goal: Cardiovascular complication will be avoided Outcome: Progressing   Problem: Activity: Goal: Risk for activity intolerance will decrease Outcome: Progressing   Problem: Nutrition: Goal: Adequate nutrition will be maintained Outcome: Progressing   Problem: Elimination: Goal: Will not experience complications related to bowel motility Outcome: Progressing Goal: Will not experience complications related to urinary retention Outcome: Progressing

## 2023-06-12 NOTE — Plan of Care (Signed)
  Problem: Ischemic Stroke/TIA Tissue Perfusion: Goal: Complications of ischemic stroke/TIA will be minimized 06/12/2023 0549 by Elita Quick, RN Outcome: Progressing 06/11/2023 2236 by Elita Quick, RN Outcome: Progressing   Problem: Coping: Goal: Will identify appropriate support needs Outcome: Progressing   Problem: Health Behavior/Discharge Planning: Goal: Goals will be collaboratively established with patient/family Outcome: Progressing   Problem: Self-Care: Goal: Ability to communicate needs accurately will improve 06/12/2023 0549 by Elita Quick, RN Outcome: Progressing 06/11/2023 2236 by Elita Quick, RN Outcome: Progressing   Problem: Nutrition: Goal: Risk of aspiration will decrease 06/12/2023 0549 by Elita Quick, RN Outcome: Progressing 06/11/2023 2236 by Elita Quick, RN Outcome: Progressing Goal: Dietary intake will improve Outcome: Progressing   Problem: Clinical Measurements: Goal: Ability to maintain clinical measurements within normal limits will improve 06/12/2023 0549 by Elita Quick, RN Outcome: Progressing 06/11/2023 2236 by Elita Quick, RN Outcome: Progressing Goal: Will remain free from infection 06/12/2023 0549 by Elita Quick, RN Outcome: Progressing 06/11/2023 2236 by Elita Quick, RN Outcome: Progressing Goal: Diagnostic test results will improve 06/12/2023 0549 by Elita Quick, RN Outcome: Progressing 06/11/2023 2236 by Elita Quick, RN Outcome: Progressing Goal: Respiratory complications will improve 06/12/2023 0549 by Elita Quick, RN Outcome: Progressing 06/11/2023 2236 by Elita Quick, RN Outcome: Progressing Goal: Cardiovascular complication will be avoided 06/12/2023 0549 by Elita Quick, RN Outcome: Progressing 06/11/2023 2236 by Elita Quick, RN Outcome: Progressing   Problem: Activity: Goal:  Risk for activity intolerance will decrease 06/12/2023 0549 by Elita Quick, RN Outcome: Progressing 06/11/2023 2236 by Elita Quick, RN Outcome: Progressing   Problem: Nutrition: Goal: Adequate nutrition will be maintained 06/12/2023 0549 by Elita Quick, RN Outcome: Progressing 06/11/2023 2236 by Elita Quick, RN Outcome: Progressing   Problem: Elimination: Goal: Will not experience complications related to bowel motility Outcome: Progressing Goal: Will not experience complications related to urinary retention Outcome: Progressing   Problem: Pain Managment: Goal: General experience of comfort will improve and/or be controlled Outcome: Progressing   Problem: Safety: Goal: Ability to remain free from injury will improve Outcome: Progressing   Problem: Skin Integrity: Goal: Risk for impaired skin integrity will decrease Outcome: Progressing

## 2023-06-12 NOTE — Progress Notes (Signed)
 PROGRESS NOTE    Tara Martin  WGN:[id number] DOB: 07-04-1940 DOA: 06/08/2023 PCP: Kristian Covey, MD    Chief Complaint  Patient presents with   Weakness   Aphasia   Altered Mental Status    Brief Narrative:    Tara Martin is a 83 y.o. female with medical history significant of anxiety, osteoarthritis, varicella-zoster, systemic lupus erythematosus, mild cognitive impairment, late onset Alzheimer's, migraine headaches, prediabetes who was admitted at the beginning of the year with pulmonary embolism and diagnosed with right middle lobe mass who is presenting to the emergency department via EMS with progressively worse mental status associated with right-sided weakness, confusion and unsteady gait, her workup significant for acute CVA, admitted for further workup   Assessment & Plan:   Principal Problem:   Acute CVA (cerebrovascular accident) (HCC) Active Problems:   SLE (systemic lupus erythematosus related syndrome) (HCC)   Prediabetes   Pulmonary embolism (HCC)   Moderate late onset Alzheimer's dementia without behavioral disturbance, psychotic disturbance, mood disturbance, or anxiety (HCC)   Normocytic anemia   Acute CVA (cerebrovascular accident) (HCC) -Patient MRI significant for acute CVA, most likely of cardioembolic source, it is concerning that it happened while on Eliquis, which may indicate hypercoagulable status  (please see discussion below under pulmonary mass) -PT, OT, SLP consulted, recommendation for CIR -started on heparin GTT, stroke protocol, currently on full dose Lovenox, will hold night before bronchoscopy.   Moderate late onset Alzheimer's dementia   without behavioral disturbance,  psychotic disturbance, mood disturbance, or anxiety (HCC) Continue donezepil 10 mg p.o. at bedtime. Supportive care.   SLE (systemic lupus erythematosus related syndrome) (HCC) Does not seem to be active. She is not currently on medications.     Prediabetes A1c is 5.5  Pulmonary mass - PCCM consulted, navigational bronchoscopy on Tuesday, hold Lovenox today before   Pulmonary embolism (HCC) The patient has been on apixaban twice daily.  Heparin GTT>> full dose Lovenox   Normocytic anemia Low B12 level -Hemoglobin 7.3, transfused 1 unit, appropriate response, hemoglobin is stable. - workup significant for B12 of 273, ferritin borderline low at 31 but will hold on IV iron for now.    DVT prophylaxis: Heparin GTT Code Status: (Full) Family Communication: None at bedside today Disposition: Will need CIR, after her bronchoscopy on Tuesday  Status is: Inpatient    Consultants:  Neurology Marshall Cork  Subjective: No significant events overnight, she denies any complaints  Objective: Vitals:   06/11/23 2013 06/12/23 0000 06/12/23 0400 06/12/23 1124  BP: (!) 150/73 128/69 (!) 120/54 (!) 152/82  Pulse: 69 64 74 79  Resp: 17 20 20 16   Temp: 98.3 F (36.8 C) 98.5 F (36.9 C) 98.3 F (36.8 C) 98.7 F (37.1 C)  TempSrc: Oral Oral Oral Oral  SpO2: 98% 94% 92%   Weight:      Height:        Intake/Output Summary (Last 24 hours) at 06/12/2023 1234 Last data filed at 06/11/2023 2015 Gross per 24 hour  Intake 600 ml  Output --  Net 600 ml   Filed Weights   06/10/23 1309 06/10/23 2205  Weight: 56.9 kg 58.3 kg    Examination:  Awake Alert, plesenetly demented  Good air movement bilaterally, CTAB RRR,No Gallops,Rubs or new Murmurs, No Parasternal Heave +ve B.Sounds, Abd Soft No Cyanosis, Clubbing or edema, No new Rash or bruise         Data Reviewed: I have personally reviewed following labs and imaging studies  CBC: Recent Labs  Lab 06/08/23 1305 06/08/23 1317 06/09/23 0422 06/10/23 0410 06/11/23 0529 06/12/23 0531  WBC 5.8  --  5.0 4.4 4.5 4.9  NEUTROABS 4.0  --   --   --   --   --   HGB 8.0* 7.8* 7.3* 9.3* 9.4* 9.5*  HCT 26.8* 23.0* 23.9* 28.6* 29.0* 30.3*  MCV 81.5  --  79.9* 80.3 80.1 82.3  PLT  236  --  203 220 240 283    Basic Metabolic Panel: Recent Labs  Lab 06/08/23 1305 06/08/23 1317 06/09/23 0422 06/10/23 0410 06/11/23 0529 06/12/23 0531  NA 135 138 135 137 137 138  K 3.3* 3.5 3.6 3.9 4.0 3.8  CL 104 107 106 107 106 107  CO2 20*  --  19* 19* 20* 21*  GLUCOSE 105* 99 90 101* 106* 97  BUN 16 13 11 10 14 15   CREATININE 0.84 0.80 0.79 0.90 0.93 1.01*  CALCIUM 9.2  --  8.6* 8.8* 8.8* 8.9  MG 2.1  --   --   --   --   --   PHOS 3.7  --   --   --   --   --     GFR: Estimated Creatinine Clearance: 39.3 mL/min (A) (by C-G formula based on SCr of 1.01 mg/dL (H)).  Liver Function Tests: Recent Labs  Lab 06/08/23 1305 06/09/23 0422  AST 22 19  ALT 11 10  ALKPHOS 75 65  BILITOT 0.6 0.3  PROT 7.8 6.2*  ALBUMIN 3.7 2.9*    CBG: Recent Labs  Lab 06/08/23 1246 06/08/23 2109  GLUCAP 90 89     No results found for this or any previous visit (from the past 240 hours).       Radiology Studies: No results found.       Scheduled Meds:  aspirin EC  81 mg Oral Daily   atorvastatin  20 mg Oral Daily   donepezil  10 mg Oral QHS   enoxaparin (LOVENOX) injection  55 mg Subcutaneous Q12H   feeding supplement  237 mL Oral BID BM   pantoprazole  40 mg Oral Daily   Continuous Infusions:     LOS: 4 days       Huey Bienenstock, MD [facility]   To contact the attending provider between 7A-7P or the covering provider during after hours 7P-7A, please log into the web site [email or web address] and access using universal [place] password for that web site. If you do not have the password, please call the hospital operator.  06/12/2023, 12:34 PM

## 2023-06-13 ENCOUNTER — Encounter (HOSPITAL_COMMUNITY): Payer: Self-pay

## 2023-06-13 ENCOUNTER — Telehealth: Payer: Self-pay

## 2023-06-13 DIAGNOSIS — I639 Cerebral infarction, unspecified: Secondary | ICD-10-CM | POA: Diagnosis not present

## 2023-06-13 LAB — CBC
HCT: 30.2 % — ABNORMAL LOW (ref 36.0–46.0)
Hemoglobin: 9.4 g/dL — ABNORMAL LOW (ref 12.0–15.0)
MCH: 25.8 pg — ABNORMAL LOW (ref 26.0–34.0)
MCHC: 31.1 g/dL (ref 30.0–36.0)
MCV: 82.7 fL (ref 80.0–100.0)
Platelets: 270 10*3/uL (ref 150–400)
RBC: 3.65 MIL/uL — ABNORMAL LOW (ref 3.87–5.11)
RDW: 16.4 % — ABNORMAL HIGH (ref 11.5–15.5)
WBC: 5.3 10*3/uL (ref 4.0–10.5)
nRBC: 0 % (ref 0.0–0.2)

## 2023-06-13 LAB — BASIC METABOLIC PANEL WITH GFR
Anion gap: 10 (ref 5–15)
BUN: 16 mg/dL (ref 8–23)
CO2: 21 mmol/L — ABNORMAL LOW (ref 22–32)
Calcium: 8.8 mg/dL — ABNORMAL LOW (ref 8.9–10.3)
Chloride: 106 mmol/L (ref 98–111)
Creatinine, Ser: 0.99 mg/dL (ref 0.44–1.00)
GFR, Estimated: 57 mL/min — ABNORMAL LOW (ref >60)
Glucose, Bld: 123 mg/dL — ABNORMAL HIGH (ref 70–99)
Potassium: 4 mmol/L (ref 3.5–5.1)
Sodium: 137 mmol/L (ref 135–145)

## 2023-06-13 MED ORDER — ENOXAPARIN SODIUM 60 MG/0.6ML IJ SOSY: 60.0000 mg | PREFILLED_SYRINGE | Freq: Two times a day (BID) | INTRAMUSCULAR | Status: DC

## 2023-06-13 NOTE — NC FL2 (Signed)
 [place] MEDICAID FL2 LEVEL OF CARE FORM     IDENTIFICATION  Patient Name: Tara Martin Birthdate: 03-19-1940 Sex: female Admission Date (Current Location): 06/08/2023  [facility] and [place] Number:  Producer, television/film/video and Address:  [place], [address] [place], [place] [address]      Provider Number: [id number]  Attending Physician Name and Address:  Elgergawy, Leana Roe, MD  Relative Name and Phone Number:       Current Level of Care: Hospital Recommended Level of Care: Skilled Nursing Facility Prior Approval Number:    Date Approved/Denied:   PASRR Number: [id number]  Discharge Plan: SNF    Current Diagnoses: Patient Active Problem List   Diagnosis Date Noted   Acute CVA (cerebrovascular accident) (HCC) 06/08/2023   History of actinic keratosis 06/08/2023   Lentigo 06/08/2023   Squamous cell cancer of skin of right forearm 06/08/2023   Hemangioma of skin and subcutaneous tissue 06/08/2023   Normocytic anemia 06/08/2023   Moderate late onset Alzheimer's dementia without behavioral disturbance, psychotic disturbance, mood disturbance, or anxiety (HCC) 05/25/2023   Delirium due to another medical condition 05/25/2023   Mass of middle lobe of right lung 03/10/2023   Acute respiratory failure with hypoxia (HCC) 03/10/2023   Acute pulmonary embolism (HCC) 03/10/2023   Pulmonary embolism (HCC) 03/09/2023   MCI (mild cognitive impairment) 05/20/2020   Acute midline low back pain with bilateral sciatica 11/05/2014   Facial numbness 02/11/2014   Prediabetes    SLE (systemic lupus erythematosus related syndrome) (HCC) 04/09/2013   Anxiety state 04/09/2013    Orientation RESPIRATION BLADDER Height & Weight     Self, Place  Normal Continent Weight: 128 lb 8.5 oz (58.3 kg) Height:  5\' 5"  (165.1 cm)  BEHAVIORAL SYMPTOMS/MOOD NEUROLOGICAL BOWEL NUTRITION STATUS      Continent Diet (see dc summary)  AMBULATORY STATUS COMMUNICATION OF  NEEDS Skin   Limited Assist Verbally Normal                       Personal Care Assistance Level of Assistance  Bathing, Feeding, Dressing Bathing Assistance: Limited assistance Feeding assistance: Limited assistance Dressing Assistance: Limited assistance     Functional Limitations Info             SPECIAL CARE FACTORS FREQUENCY  PT (By licensed PT), OT (By licensed OT)     PT Frequency: 5x/week OT Frequency: 5x/week            Contractures Contractures Info: Not present    Additional Factors Info  Code Status, Allergies Code Status Info: Full Allergies Info: Codeine           Current Medications (06/13/2023):  This is the current hospital active medication list Current Facility-Administered Medications  Medication Dose Route Frequency Provider Last Rate Last Admin   acetaminophen (TYLENOL) tablet 650 mg  650 mg Oral Q6H PRN Bobette Mo, MD       Or   acetaminophen (TYLENOL) suppository 650 mg  650 mg Rectal Q6H PRN Bobette Mo, MD       ALPRAZolam Prudy Feeler) tablet 0.5 mg  0.5 mg Oral Daily PRN Elgergawy, Leana Roe, MD   0.5 mg at 06/11/23 2015   aspirin EC tablet 81 mg  81 mg Oral Daily Milon Dikes, MD   81 mg at 06/13/23 1026   atorvastatin (LIPITOR) tablet 20 mg  20 mg Oral Daily Marvel Plan, MD   20 mg at  06/13/23 1026   donepezil (ARICEPT) tablet 10 mg  10 mg Oral QHS Elgergawy, Leana Roe, MD   10 mg at 06/12/23 2158   [START ON 06/15/2023] enoxaparin (LOVENOX) injection 60 mg  60 mg Subcutaneous Q12H Lorin Glass, MD       feeding supplement (ENSURE ENLIVE / ENSURE PLUS) liquid 237 mL  237 mL Oral BID BM Elgergawy, Leana Roe, MD   237 mL at 06/12/23 1710   ondansetron (ZOFRAN) tablet 4 mg  4 mg Oral Q6H PRN Bobette Mo, MD       Or   ondansetron [facility]) injection 4 mg  4 mg Intravenous Q6H PRN Bobette Mo, MD       pantoprazole (PROTONIX) EC tablet 40 mg  40 mg Oral Daily Elgergawy, Leana Roe, MD   40 mg at 06/13/23 1026      Discharge Medications: Please see discharge summary for a list of discharge medications.  Relevant Imaging Results:  Relevant Lab Results:   Additional Information SSN: [id number] [place], [place]

## 2023-06-13 NOTE — TOC Progression Note (Addendum)
 Transition of Care [facility]) - Progression Note    Patient Details  Name: Tara Martin MRN: [id number] Date of Birth: 1940/06/07  [facility]) CM/SW Contact  Mearl Latin, LCSW Phone Number: 06/13/2023, 8:58 AM  Clinical Narrative:    TOC continuing to follow for insurance determination of CIR.   CSW spoke with patient's spouse and if insurance denies CIR, he is fine with SNF rehab short term.         Expected Discharge Plan and Services                                               Social Determinants of Health (SDOH) Interventions SDOH Screenings   Food Insecurity: No Food Insecurity (06/08/2023)  Housing: Low Risk  (06/08/2023)  Transportation Needs: No Transportation Needs (06/08/2023)  Utilities: Not At Risk (06/08/2023)  Alcohol Screen: Low Risk  (06/25/2022)  Depression (PHQ2-9): Low Risk  (06/25/2022)  Financial Resource Strain: Low Risk  (06/25/2022)  Physical Activity: Insufficiently Active (06/25/2022)  Social Connections: Unknown (06/08/2023)  Stress: No Stress Concern Present (06/25/2022)  Tobacco Use: Low Risk  (05/25/2023)    Readmission Risk Interventions     No data to display

## 2023-06-13 NOTE — H&P (View-Only) (Signed)
 NAME:  Tara Martin, MRN:  [id number], DOB:  Sep 20, 1940, LOS: 5 ADMISSION DATE:  06/08/2023, CONSULTATION DATE:  06/09/2023 REFERRING MD:  Elgergawy - [facility] , CHIEF COMPLAINT:  Lung mass    History of Present Illness:  83 year old woman with a past medical history significant for recently diagnosed bilateral PEs anticoagulated with Eliquis, lupus, mild cognitive impairment in the setting of late onset Alzheimer's, prediabetes, and anxiety who presented to the ED at [facility] 4/2 for complaints of AMS, aphasia, and weakness.  Head CT obtained on admission revealed acute to subacute infarct in the left occipital lobe and small cortical infarct in the right parietal region in the setting of hypercoagulable state from likely underlying lung cancer.  Patient reports compliance with anticoagulation with Eliquis prior to admission.  Last dose of Eliquis was 4/2 at 9 AM per spouse.  Patient has been established with outpatient pulmonary and is pending lung mass workup with potential tissue sampling.  Given readmission with now new stroke despite anticoagulation pulmonary has been consulted to assist with potential tissue sampling while inpatient.  Pertinent Medical History:  Recently diagnosed bilateral PEs anticoagulated with Eliquis, lupus, mild cognitive impairment in the setting of late onset Alzheimer's, prediabetes, and anxiety  Significant Hospital Events: Including procedures, antibiotic start and stop dates in addition to other pertinent events   4/2 Presented with altered mental status, aphasia, and weakness workup revealed new CVA, admitted per hospitalist 4/3 Pulmonary consulted for assistance in working up lung mass  Interim History / Subjective:  No significant events Feeling well overall Husband at bedside, asking about procedure timing Denies CP/SOB, on RA Plan for bronch with Dr. Delton Coombes tomorrow, 4/8  Objective:  Blood pressure (!) 126/59, pulse 77, temperature 98 F (36.7 C),  temperature source Oral, resp. rate 20, height 5\' 5"  (1.651 m), weight 58.3 kg, SpO2 95%.        Intake/Output Summary (Last 24 hours) at 06/13/2023 1510 Last data filed at 06/13/2023 0900 Gross per 24 hour  Intake --  Output 300 ml  Net -300 ml   Filed Weights   06/10/23 1309 06/10/23 2205  Weight: 56.9 kg 58.3 kg   Physical Examination: General: Overall well-appearing elderly woman in NAD. Pleasant and conversant. HEENT: [place]/AT, anicteric sclera, moist mucous membranes. Neuro: Awake, oriented x 3-4 (situation). Responds to verbal stimuli. Following commands consistently. Moves all 4 extremities spontaneously.  CV: RRR, no m/g/r. PULM: Breathing even and unlabored on RA. Lung fields CTAB, slightly diminished on R.  GI: Soft, nontender, nondistended. Normoactive bowel sounds. Extremities: No LE edema noted. Skin: Warm/dry, no rashes.  Resolved Hospital Problem List:     Assessment & Plan:  Pulmonary nodule CTA Chest 03/09/2023 with 2.5cm somewhat spiculated mass noted in right middle lobe adjacent to cardiac border and mediastinal lymph node measuring 10mm; now enlarged to 2.5cm x 4cm on recent CT Chest Bilateral pulmonary emboli, seen on CTA chest 03/09/2023 started on Eliquis (ast dose of Eliquis 4/2 0900) - Bronchoscopy planned for 4/8 with Dr. Delton Coombes - NPO at midnight 4/8 - Hold Lovenox, resume 4/9  Acute multifocal infarcts acute to subacute despite anticoagulation MRI Brain with multifocal acute ischemia including the left occipital lobe, left temporal lobe, left thalamus and bilateral parietal subcortical white matter with petechial hemorrhage - Neuro following, appreciate assistance - Continue to hold Eliquis - Continue ASA  Plan for bronch tomorrow 4/8.  Best Practice: (right click and "Reselect all SmartList Selections" daily)  Per Primary Team  Signature:  Tim Lair, PA-C [facility] 06/13/23 3:11 PM  Please see [email or web address] for pager  details. From 7A-7P if no response, please call [phone] After hours, please call ELink [phone]

## 2023-06-13 NOTE — Telephone Encounter (Signed)
 Attempted to call spouse re: test results. Pt is currently in the hospital, admitted on 4/6. LVM for spouse to call back.

## 2023-06-13 NOTE — Progress Notes (Signed)
 Speech Language Pathology Treatment: Cognitive-Linquistic  Patient Details Name: Tara Martin MRN: [id number] DOB: 05-Mar-1941 Today's Date: 06/13/2023 Time: 4098-1191 SLP Time Calculation (min) (ACUTE ONLY): 35 min  Assessment / Plan / Recommendation Clinical Impression  Pt upright in chair seen for aphasia treatment with husband present. She is conversing in short sentences with intermittent phoneme substitutions present. She exhibits decreased awareness to her significant visual deficits and right inattention and needed max cues and reminders to look to her right side of environment using objects and words. Introduced strategy of finger scanning which was minimally effective this session. She answered mildly abstract yes/no questions with 100% acc, named simple objects 3/3 and needed semantic and phonemic cues for more abstract word. She followed abstract commands using objects (subtest on WAB) with max cues mostly due to visual impairments and several times she forgot what the command was. Pt would benefit from inpatient CIR > 3 hours and CIR coordinator is following.    HPI HPI: 83 y.o female presenting to the emergency department via EMS with progressively worse mental status associated with right-sided weakness, confusion and unsteady gait.  MRI shows multifocal infarcts-acute to subacute involving the left PCA territory (left occipital lobe, left temporal lobe, left thalamus), bilateral parietal lobes and bilateral cerebellar infarcts. Recent appt with Dr Vickey Huger, [facility] on 05/25/23, dx with moderate dementia (MMSE 18/30). PMH includes anxiety, osteoarthritis, varicella-zoster, systemic lupus erythematosus, migraine headaches, prediabetes.      SLP Plan  Continue with current plan of care      Recommendations for follow up therapy are one component of a multi-disciplinary discharge planning process, led by the attending physician.  Recommendations may be updated  based on patient status, additional functional criteria and insurance authorization.    Recommendations                         Frequent or constant Supervision/Assistance Aphasia (R47.01)     Continue with current plan of care     Royce Macadamia  06/13/2023, 12:45 PM

## 2023-06-13 NOTE — Progress Notes (Signed)
 Occupational Therapy Treatment Patient Details Name: Tara Martin MRN: [id number] DOB: 07-14-1940 Today's Date: 06/13/2023   History of present illness 83 y.o female presenting to the emergency department 4/2 with progressively worse mental status associated with right-sided weakness, confusion and unsteady gait.  MRI shows multifocal infarcts-acute to subacute involving the left PCA territory (open left occipital lobe, left thalamus), bilateral parietal lobes and bilateral cerebellar infarcts. PMH significant of anxiety, osteoarthritis, varicella-zoster, systemic lupus erythematosus, mild cognitive impairment, late onset Alzheimer's, migraine headaches, prediabetes. (Simultaneous filing. User may not have seen previous data.)   OT comments  Pt making slow but very steady progress with all adls and transfers. Pt continues to be very limited with all adls (at mod to max assist level overall) due to motor planning, sequencing and visual deficits. Pt is weak in the right side making all adls difficulty. Pt has a significant R inattention but also has difficulty scanning and seeing to the left when walking.  Feel greater than 3 hours of therapy a day will assist this pt in attempting to reach her previous level of functioning which is mod I. Pt has decreased insight and memory to all deficits and will need someone with her 24/7.       If plan is discharge home, recommend the following:  A lot of help with walking and/or transfers;A lot of help with bathing/dressing/bathroom;Assistance with cooking/housework;Assist for transportation;Help with stairs or ramp for entrance;Direct supervision/assist for medications management;Assistance with feeding   Equipment Recommendations       Recommendations for Other Services      Precautions / Restrictions Precautions Precautions: Fall (Simultaneous filing. User may not have seen previous data.) Recall of Precautions/Restrictions: Impaired (Simultaneous  filing. User may not have seen previous data.) Restrictions Weight Bearing Restrictions Per Provider Order: No (Simultaneous filing. User may not have seen previous data.)       Mobility Bed Mobility               General bed mobility comments: pt in chair on arrival (Simultaneous filing. User may not have seen previous data.)    Transfers Overall transfer level: Needs assistance (Simultaneous filing. User may not have seen previous data.) Equipment used: Rolling walker (2 wheels) (Simultaneous filing. User may not have seen previous data.) Transfers: Sit to/from Stand, Bed to chair/wheelchair/BSC (Simultaneous filing. User may not have seen previous data.) Sit to Stand: Min assist (Simultaneous filing. User may not have seen previous data.)           General transfer comment: cues for hand placement and safety and to get hands positioned on walker correctly before walking. (Simultaneous filing. User may not have seen previous data.)     Balance Overall balance assessment: Needs assistance (Simultaneous filing. User may not have seen previous data.) Sitting-balance support: No upper extremity supported, Feet supported (Simultaneous filing. User may not have seen previous data.) Sitting balance-Leahy Scale: Fair (Simultaneous filing. User may not have seen previous data.) Sitting balance - Comments: EOB (Simultaneous filing. User may not have seen previous data.)   Standing balance support: Bilateral upper extremity supported, During functional activity (Simultaneous filing. User may not have seen previous data.) Standing balance-Leahy Scale: Poor (Simultaneous filing. User may not have seen previous data.) Standing balance comment: min assist for all tasks in standing when at the sink. (Simultaneous filing. User may not have seen previous data.)  ADL either performed or assessed with clinical judgement   ADL Overall ADL's : Needs  assistance/impaired Eating/Feeding: Moderate assistance;Sitting;Cueing for sequencing Eating/Feeding Details (indicate cue type and reason): Pt using cup with increased independence. Vision limits abiltiy to feed self without help. Pt often cannot find food on plate despite cues and using clock to orient self to plate. Pt unsure of clock set up and thefore these cues are not effective. Pt apraxia and incoordiation in RUE making feeding self R handed difficult. Grooming: Moderate assistance;Standing;Cueing for sequencing;Oral care Grooming Details (indicate cue type and reason): Pt stood at sink to groom. Unable to sequence or plan the task without max cues. Pt unable to find items on countertop even if they were on her L and harder time finding them on the R.  Once pt got started with brushing, pt brushed with min assist tomove toothbrush around in hand correctly due to apraxia.             Lower Body Dressing: Maximal assistance;Cueing for sequencing;Sitting/lateral leans Lower Body Dressing Details (indicate cue type and reason): Pt required step by step cues to donn socks in sitting. Physically required min assist.             Functional mobility during ADLs: Moderate assistance;Rolling walker (2 wheels) General ADL Comments: Pt very limited due to poor vision, R inattention and motor planning and sequencing deficits.    Extremity/Trunk Assessment Upper Extremity Assessment Upper Extremity Assessment: RUE deficits/detail RUE Deficits / Details: good strength, poor awareness and coordination due to recent stroke, R side neglect. Significant difficulty holding items or performing functional tasks. Right side more affected. RUE: Shoulder pain with ROM RUE Sensation: decreased light touch RUE Coordination: decreased fine motor;decreased gross motor LUE Deficits / Details: Pt uses L side in a more coordinated fashion but is accustomed to being R handed so is using R hand more than  left. LUE Coordination: decreased fine motor;decreased gross motor   Lower Extremity Assessment Lower Extremity Assessment: Defer to PT evaluation        Vision   Vision Assessment?: Yes Ocular Range of Motion: Restricted on the right;Restricted on the left;Restricted looking up;Restricted looking down Alignment/Gaze Preference: Gaze left Tracking/Visual Pursuits: Other (comment) (Pt unable to follow commands to track L/R/up or down) Depth Perception: Overshoots Additional Comments: Pt's vision appears impaired to the R and to the L   Perception Perception Perception: Impaired Preception Impairment Details: Inattention/Neglect   Praxis Praxis Praxis: Impaired Praxis Impairment Details: Ideomotor;Motor planning;Organization   Communication Communication Communication: Impaired (Simultaneous filing. User may not have seen previous data.) Factors Affecting Communication: Difficulty expressing self (Simultaneous filing. User may not have seen previous data.)   Cognition Arousal: Alert (Simultaneous filing. User may not have seen previous data.) Behavior During Therapy: [facility] for tasks assessed/performed (Simultaneous filing. User may not have seen previous data.) Cognition: Cognition impaired   Orientation impairments: Time Awareness: Online awareness impaired, Intellectual awareness impaired Memory impairment (select all impairments): Short-term memory, Working memory, Non-declarative long-term memory Attention impairment (select first level of impairment): Focused attention Executive functioning impairment (select all impairments): Sequencing, Reasoning, Problem solving, Organization, Initiation OT - Cognition Comments: Pt requires step by step instructions for all basic adls. Cues to look to the R and often to the L as well when navigation environment. Pt with poor motor planning and sequencing.                 Following commands: Impaired (Simultaneous filing. User may  not  have seen previous data.) Following commands impaired: Only follows one step commands consistently (Simultaneous filing. User may not have seen previous data.)      Cueing   Cueing Techniques: Verbal cues, Gestural cues, Visual cues (Simultaneous filing. User may not have seen previous data.)  Exercises      Shoulder Instructions       General Comments Pt very limited due to vision, perception and cognition with all adls. Will need 24 hour assist. (Simultaneous filing. User may not have seen previous data.)    Pertinent Vitals/ Pain       Pain Assessment Pain Assessment: No/denies pain (Simultaneous filing. User may not have seen previous data.)  Home Living                                          Prior Functioning/Environment              Frequency  Min 2X/week        Progress Toward Goals  OT Goals(current goals can now be found in the care plan section)  Progress towards OT goals: Progressing toward goals  Acute Rehab OT Goals Patient Stated Goal: to get well OT Goal Formulation: With patient/family Time For Goal Achievement: 06/23/23 Potential to Achieve Goals: Good ADL Goals Pt Will Perform Upper Body Dressing: with min assist;sitting Pt Will Perform Lower Body Dressing: with min assist;sitting/lateral leans;sit to/from stand Pt Will Transfer to Toilet: with contact guard assist;bedside commode Pt Will Perform Toileting - Clothing Manipulation and hygiene: with min assist;sitting/lateral leans;sit to/from stand Additional ADL Goal #1: Pt will be able to complete grooming tasks sitting with set up/supervision, verbal cues as needed, using visual scanning to reduce R side neglect  Plan      Co-evaluation                 AM-PAC OT "6 Clicks" Daily Activity     Outcome Measure   Help from another person eating meals?: A Lot Help from another person taking care of personal grooming?: A Lot Help from another person toileting,  which includes using toliet, bedpan, or urinal?: A Lot Help from another person bathing (including washing, rinsing, drying)?: A Lot Help from another person to put on and taking off regular upper body clothing?: A Lot Help from another person to put on and taking off regular lower body clothing?: A Lot 6 Click Score: 12    End of Session Equipment Utilized During Treatment: Gait belt;Rolling walker (2 wheels)  OT Visit Diagnosis: Unsteadiness on feet (R26.81);Other abnormalities of gait and mobility (R26.89);Muscle weakness (generalized) (M62.81);Hemiplegia and hemiparesis;Other symptoms and signs involving cognitive function;Other symptoms and signs involving the nervous system (R29.898) Hemiplegia - Right/Left: Right Hemiplegia - dominant/non-dominant: Dominant   Activity Tolerance Patient tolerated treatment well   Patient Left in chair;with call bell/phone within reach;with chair alarm set;with family/visitor present   Nurse Communication Mobility status        Time: 6644-0347 OT Time Calculation (min): 31 min  Charges: OT General Charges $OT Visit: 1 Visit OT Treatments $Self Care/Home Management : 23-37 mins   Hope Budds 06/13/2023, 11:43 AM

## 2023-06-13 NOTE — Progress Notes (Signed)
 NAME:  Tara Martin, MRN:  [id number], DOB:  Sep 20, 1940, LOS: 5 ADMISSION DATE:  06/08/2023, CONSULTATION DATE:  06/09/2023 REFERRING MD:  Elgergawy - [facility] , CHIEF COMPLAINT:  Lung mass    History of Present Illness:  83 year old woman with a past medical history significant for recently diagnosed bilateral PEs anticoagulated with Eliquis, lupus, mild cognitive impairment in the setting of late onset Alzheimer's, prediabetes, and anxiety who presented to the ED at [facility] 4/2 for complaints of AMS, aphasia, and weakness.  Head CT obtained on admission revealed acute to subacute infarct in the left occipital lobe and small cortical infarct in the right parietal region in the setting of hypercoagulable state from likely underlying lung cancer.  Patient reports compliance with anticoagulation with Eliquis prior to admission.  Last dose of Eliquis was 4/2 at 9 AM per spouse.  Patient has been established with outpatient pulmonary and is pending lung mass workup with potential tissue sampling.  Given readmission with now new stroke despite anticoagulation pulmonary has been consulted to assist with potential tissue sampling while inpatient.  Pertinent Medical History:  Recently diagnosed bilateral PEs anticoagulated with Eliquis, lupus, mild cognitive impairment in the setting of late onset Alzheimer's, prediabetes, and anxiety  Significant Hospital Events: Including procedures, antibiotic start and stop dates in addition to other pertinent events   4/2 Presented with altered mental status, aphasia, and weakness workup revealed new CVA, admitted per hospitalist 4/3 Pulmonary consulted for assistance in working up lung mass  Interim History / Subjective:  No significant events Feeling well overall Husband at bedside, asking about procedure timing Denies CP/SOB, on RA Plan for bronch with Dr. Delton Coombes tomorrow, 4/8  Objective:  Blood pressure (!) 126/59, pulse 77, temperature 98 F (36.7 C),  temperature source Oral, resp. rate 20, height 5\' 5"  (1.651 m), weight 58.3 kg, SpO2 95%.        Intake/Output Summary (Last 24 hours) at 06/13/2023 1510 Last data filed at 06/13/2023 0900 Gross per 24 hour  Intake --  Output 300 ml  Net -300 ml   Filed Weights   06/10/23 1309 06/10/23 2205  Weight: 56.9 kg 58.3 kg   Physical Examination: General: Overall well-appearing elderly woman in NAD. Pleasant and conversant. HEENT: [place]/AT, anicteric sclera, moist mucous membranes. Neuro: Awake, oriented x 3-4 (situation). Responds to verbal stimuli. Following commands consistently. Moves all 4 extremities spontaneously.  CV: RRR, no m/g/r. PULM: Breathing even and unlabored on RA. Lung fields CTAB, slightly diminished on R.  GI: Soft, nontender, nondistended. Normoactive bowel sounds. Extremities: No LE edema noted. Skin: Warm/dry, no rashes.  Resolved Hospital Problem List:     Assessment & Plan:  Pulmonary nodule CTA Chest 03/09/2023 with 2.5cm somewhat spiculated mass noted in right middle lobe adjacent to cardiac border and mediastinal lymph node measuring 10mm; now enlarged to 2.5cm x 4cm on recent CT Chest Bilateral pulmonary emboli, seen on CTA chest 03/09/2023 started on Eliquis (ast dose of Eliquis 4/2 0900) - Bronchoscopy planned for 4/8 with Dr. Delton Coombes - NPO at midnight 4/8 - Hold Lovenox, resume 4/9  Acute multifocal infarcts acute to subacute despite anticoagulation MRI Brain with multifocal acute ischemia including the left occipital lobe, left temporal lobe, left thalamus and bilateral parietal subcortical white matter with petechial hemorrhage - Neuro following, appreciate assistance - Continue to hold Eliquis - Continue ASA  Plan for bronch tomorrow 4/8.  Best Practice: (right click and "Reselect all SmartList Selections" daily)  Per Primary Team  Signature:  Tim Lair, PA-C [facility] 06/13/23 3:11 PM  Please see [email or web address] for pager  details. From 7A-7P if no response, please call [phone] After hours, please call ELink [phone]

## 2023-06-13 NOTE — Progress Notes (Signed)
 PROGRESS NOTE    Tara Martin  GMW:[id number] DOB: [birth year] DOA: 06/08/2023 PCP: Kristian Covey, MD    Chief Complaint  Patient presents with   Weakness   Aphasia   Altered Mental Status    Brief Narrative:    Tara Martin is a 83 y.o. female with medical history significant of anxiety, osteoarthritis, varicella-zoster, systemic lupus erythematosus, mild cognitive impairment, late onset Alzheimer's, migraine headaches, prediabetes who was admitted at the beginning of the year with pulmonary embolism and diagnosed with right middle lobe mass who is presenting to the emergency department via EMS with progressively worse mental status associated with right-sided weakness, confusion and unsteady gait, her workup significant for acute CVA, admitted for further workup   Assessment & Plan:   Principal Problem:   Acute CVA (cerebrovascular accident) (HCC) Active Problems:   SLE (systemic lupus erythematosus related syndrome) (HCC)   Prediabetes   Pulmonary embolism (HCC)   Moderate late onset Alzheimer's dementia without behavioral disturbance, psychotic disturbance, mood disturbance, or anxiety (HCC)   Normocytic anemia   Acute CVA (cerebrovascular accident) (HCC) -Patient MRI significant for acute CVA, most likely of cardioembolic source, it is concerning that it happened while on Eliquis, which may indicate hypercoagulable status  (please see discussion below under pulmonary mass) -PT, OT, SLP consulted, recommendation for CIR -started on heparin GTT, stroke protocol, currently on full dose Lovenox, stroke on hold as plan for navigational bronchoscopy tomorrow, resume Lovenox/11/2023   Moderate late onset Alzheimer's dementia   without behavioral disturbance,  psychotic disturbance, mood disturbance, or anxiety (HCC) Continue donezepil 10 mg p.o. at bedtime. Supportive care.   SLE (systemic lupus erythematosus related syndrome) (HCC) Does not seem to be active. She  is not currently on medications.    Prediabetes A1c is 5.5  Pulmonary mass - PCCM consulted, concern for malignancy, so plan for navigational bronchoscopy tomorrow, will hold Lovenox today in anticipation for procedure tomorrow.   Pulmonary embolism (HCC) The patient has been on apixaban twice daily.  Heparin GTT>> full dose Lovenox   Normocytic anemia Low B12 level -Hemoglobin 7.3, transfused 1 unit, appropriate response, hemoglobin is stable. - workup significant for B12 of 273, ferritin borderline low at 31 but will hold on IV iron for now.    DVT prophylaxis: Heparin GTT Code Status: (Full) Family Communication: None at bedside today Disposition: Will need CIR, after her bronchoscopy on Tuesday  Status is: Inpatient    Consultants:  Neurology [facility]  Subjective: No significant events overnight, she denies any complaints  Objective: Vitals:   06/12/23 1600 06/12/23 2000 06/13/23 0000 06/13/23 0555  BP: 121/63 139/78 (!) 140/74 (!) 126/59  Pulse: 68   77  Resp: 18 20 15 20   Temp: 98.5 F (36.9 C)   98 F (36.7 C)  TempSrc: Oral   Oral  SpO2:    95%  Weight:      Height:        Intake/Output Summary (Last 24 hours) at 06/13/2023 1328 Last data filed at 06/13/2023 0900 Gross per 24 hour  Intake 120 ml  Output 300 ml  Net -180 ml   Filed Weights   06/10/23 1309 06/10/23 2205  Weight: 56.9 kg 58.3 kg    Examination: Awake Alert, densely demented CTAB RRR,No Gallops,Rubs or new Murmurs, No Parasternal Heave +ve B.Sounds, Abd Soft No Cyanosis, Clubbing or edema, No new Rash or bruise         Data Reviewed: I have personally reviewed following  labs and imaging studies  CBC: Recent Labs  Lab 06/08/23 1305 06/08/23 1317 06/09/23 0422 06/10/23 0410 06/11/23 0529 06/12/23 0531 06/13/23 0418  WBC 5.8  --  5.0 4.4 4.5 4.9 5.3  NEUTROABS 4.0  --   --   --   --   --   --   HGB 8.0*   < > 7.3* 9.3* 9.4* 9.5* 9.4*  HCT 26.8*   < > 23.9* 28.6*  29.0* 30.3* 30.2*  MCV 81.5  --  79.9* 80.3 80.1 82.3 82.7  PLT 236  --  203 220 240 283 270   < > = values in this interval not displayed.    Basic Metabolic Panel: Recent Labs  Lab 06/08/23 1305 06/08/23 1317 06/09/23 0422 06/10/23 0410 06/11/23 0529 06/12/23 0531 06/13/23 0418  NA 135   < > 135 137 137 138 137  K 3.3*   < > 3.6 3.9 4.0 3.8 4.0  CL 104   < > 106 107 106 107 106  CO2 20*  --  19* 19* 20* 21* 21*  GLUCOSE 105*   < > 90 101* 106* 97 123*  BUN 16   < > 11 10 14 15 16   CREATININE 0.84   < > 0.79 0.90 0.93 1.01* 0.99  CALCIUM 9.2  --  8.6* 8.8* 8.8* 8.9 8.8*  MG 2.1  --   --   --   --   --   --   PHOS 3.7  --   --   --   --   --   --    < > = values in this interval not displayed.    GFR: Estimated Creatinine Clearance: 40.1 mL/min (by C-G formula based on SCr of 0.99 mg/dL).  Liver Function Tests: Recent Labs  Lab 06/08/23 1305 06/09/23 0422  AST 22 19  ALT 11 10  ALKPHOS 75 65  BILITOT 0.6 0.3  PROT 7.8 6.2*  ALBUMIN 3.7 2.9*    CBG: Recent Labs  Lab 06/08/23 1246 06/08/23 2109  GLUCAP 90 89     No results found for this or any previous visit (from the past 240 hours).       Radiology Studies: No results found.       Scheduled Meds:  aspirin EC  81 mg Oral Daily   atorvastatin  20 mg Oral Daily   donepezil  10 mg Oral QHS   [START ON 06/15/2023] enoxaparin (LOVENOX) injection  60 mg Subcutaneous Q12H   feeding supplement  237 mL Oral BID BM   pantoprazole  40 mg Oral Daily   Continuous Infusions:     LOS: 5 days       Huey Bienenstock, MD [facility]   To contact the attending provider between 7A-7P or the covering provider during after hours 7P-7A, please log into the web site [email or web address] and access using universal [place] password for that web site. If you do not have the password, please call the hospital operator.  06/13/2023, 1:28 PM

## 2023-06-13 NOTE — Progress Notes (Signed)
 Physical Therapy Treatment Patient Details Name: Tara Martin MRN: [id number] DOB: 07-25-1940 Today's Date: 06/13/2023   History of Present Illness 83 y.o female presenting to the emergency department 4/2 with progressively worse mental status associated with right-sided weakness, confusion and unsteady gait.  MRI shows multifocal infarcts-acute to subacute involving the left PCA territory (open left occipital lobe, left thalamus), bilateral parietal lobes and bilateral cerebellar infarcts. PMH significant of anxiety, osteoarthritis, varicella-zoster, systemic lupus erythematosus, mild cognitive impairment, late onset Alzheimer's, migraine headaches, prediabetes.    PT Comments  Continues to demonstrate functional improvements with training. Min assist for transfer today, reaching automatically with RUE and shows improved coordination from prior visits. Better oriented as well, recalling self, location, and situations ("Here for stroke") but could not recall month/year or specific deficits resulting from CVA. Recalled 1/3 items when tested <5 minutes after being told and stating back those 3 objects. Min assist with gait, RW for support, drifts out of RW, still having neglect on Rt but seems to be improving and is recognizing/reading wall signs with VC to turn her head further towards the right to fully visualize room numbers. Patient will continue to benefit from skilled physical therapy services to further improve independence with functional mobility. Patient will benefit from intensive inpatient follow-up therapy, >3 hours.   If plan is discharge home, recommend the following: A lot of help with walking and/or transfers;A lot of help with bathing/dressing/bathroom;Assistance with cooking/housework;Direct supervision/assist for medications management;Direct supervision/assist for financial management;Assist for transportation;Help with stairs or ramp for entrance;Supervision due to cognitive status    Can travel by private vehicle        Equipment Recommendations   (TBD next venue)    Recommendations for Other Services Rehab consult     Precautions / Restrictions Precautions Precautions: Fall Recall of Precautions/Restrictions: Impaired Restrictions Weight Bearing Restrictions Per Provider Order: No     Mobility  Bed Mobility Overal bed mobility: Needs Assistance Bed Mobility: Supine to Sit     Supine to sit: HOB elevated, Contact guard     General bed mobility comments: Slow and effortful, minor cues to scoot to EOB, HOB elevated, CGA for safety and to be aware of alignment on EOB.    Transfers Overall transfer level: Needs assistance Equipment used: Rolling walker (2 wheels) Transfers: Sit to/from Stand Sit to Stand: Min assist           General transfer comment: Min assist for boost and balance (leaning posteriorly). Does a better job of automatically reaching for walker with RUE upon rising with improved accuracy and no VC to recall.    Ambulation/Gait Ambulation/Gait assistance: Min assist Gait Distance (Feet): 100 Feet Assistive device: Rolling walker (2 wheels) Gait Pattern/deviations: Step-through pattern, Decreased stride length, Ataxic, Drifts right/left, Wide base of support Gait velocity: dec Gait velocity interpretation: <1.31 ft/sec, indicative of household ambulator   General Gait Details: Intermittent min assist for RW control and to guide body into RW for proper alignment - drifts outside of walker BOS with turns. Needs intermittent cues for awareness of objects/obstacles on Rt multimodal cues to avoid 50% of the time avoiding on her own with only verbal cues. No overt buckling. VSS.   Stairs             Wheelchair Mobility     Tilt Bed    Modified Rankin (Stroke Patients Only) Modified Rankin (Stroke Patients Only) Pre-Morbid Rankin Score: No symptoms Modified Rankin: Moderately severe disability     Balance Overall  balance assessment: Needs assistance Sitting-balance support: No upper extremity supported, Feet supported Sitting balance-Leahy Scale: Fair Sitting balance - Comments: EOB   Standing balance support: Single extremity supported, During functional activity, Reliant on assistive device for balance Standing balance-Leahy Scale: Poor Standing balance comment: CGA with RW for support                            Communication Communication Communication: Impaired Factors Affecting Communication: Difficulty expressing self  Cognition Arousal: Alert Behavior During Therapy: WFL for tasks assessed/performed   PT - Cognitive impairments: Orientation, Attention, Sequencing, Problem solving, Safety/Judgement, Awareness, Memory, Initiation Difficult to assess due to: Impaired communication Orientation impairments: Time                   PT - Cognition Comments: Oriented to self, location, and situation. Unsure of month/year Following commands: Impaired Following commands impaired: Only follows one step commands consistently    Cueing Cueing Techniques: Verbal cues, Gestural cues, Visual cues, Tactile cues  Exercises      General Comments General comments (skin integrity, edema, etc.): VSS      Pertinent Vitals/Pain Pain Assessment Pain Assessment: No/denies pain    Home Living                          Prior Function            PT Goals (current goals can now be found in the care plan section) Acute Rehab PT Goals Patient Stated Goal: get well PT Goal Formulation: With patient/family Time For Goal Achievement: 06/23/23 Potential to Achieve Goals: Good Progress towards PT goals: Progressing toward goals    Frequency    Min 3X/week      PT Plan      Co-evaluation              AM-PAC PT "6 Clicks" Mobility   Outcome Measure  Help needed turning from your back to your side while in a flat bed without using bedrails?: A Little Help  needed moving from lying on your back to sitting on the side of a flat bed without using bedrails?: A Little Help needed moving to and from a bed to a chair (including a wheelchair)?: A Little Help needed standing up from a chair using your arms (e.g., wheelchair or bedside chair)?: A Little Help needed to walk in hospital room?: A Lot Help needed climbing 3-5 steps with a railing? : A Lot 6 Click Score: 16    End of Session Equipment Utilized During Treatment: Gait belt Activity Tolerance: Patient tolerated treatment well Patient left: with call bell/phone within reach;in chair;with chair alarm set;with family/visitor present   PT Visit Diagnosis: Unsteadiness on feet (R26.81);Other abnormalities of gait and mobility (R26.89);Hemiplegia and hemiparesis;Ataxic gait (R26.0);Other symptoms and signs involving the nervous system (R29.898) Hemiplegia - Right/Left: Right Hemiplegia - caused by: Cerebral infarction     Time: 9562-1308 PT Time Calculation (min) (ACUTE ONLY): 15 min  Charges:    $Gait Training: 8-22 mins PT General Charges $$ ACUTE PT VISIT: 1 Visit                     Kathlyn Sacramento, PT, DPT [facility]  Rehabilitation Services Physical Therapist Office: [phone] Website: [email or web address]    Berton Mount 06/13/2023, 11:34 AM

## 2023-06-14 ENCOUNTER — Encounter (HOSPITAL_COMMUNITY): Payer: Self-pay | Admitting: Physical Medicine & Rehabilitation

## 2023-06-14 ENCOUNTER — Encounter (HOSPITAL_COMMUNITY)
Admission: EM | Disposition: A | Discharge status: Rehabilitation Facility | Payer: Self-pay | Source: Home / Self Care | Attending: Internal Medicine

## 2023-06-14 ENCOUNTER — Other Ambulatory Visit: Payer: Self-pay

## 2023-06-14 ENCOUNTER — Inpatient Hospital Stay (HOSPITAL_COMMUNITY)
Admission: AD | Admit: 2023-06-14 | Discharge: 2023-06-24 | DRG: 057 | Disposition: A | Discharge status: Hospice | Source: Intra-hospital | Attending: Physical Medicine & Rehabilitation | Admitting: Physical Medicine & Rehabilitation

## 2023-06-14 ENCOUNTER — Inpatient Hospital Stay (HOSPITAL_COMMUNITY): Admitting: Certified Registered"

## 2023-06-14 ENCOUNTER — Encounter (HOSPITAL_COMMUNITY): Payer: Self-pay

## 2023-06-14 ENCOUNTER — Other Ambulatory Visit

## 2023-06-14 DIAGNOSIS — H548 Legal blindness, as defined in USA: Secondary | ICD-10-CM | POA: Diagnosis present

## 2023-06-14 DIAGNOSIS — C3490 Malignant neoplasm of unspecified part of unspecified bronchus or lung: Secondary | ICD-10-CM | POA: Insufficient documentation

## 2023-06-14 DIAGNOSIS — Z7901 Long term (current) use of anticoagulants: Secondary | ICD-10-CM

## 2023-06-14 DIAGNOSIS — Z8261 Family history of arthritis: Secondary | ICD-10-CM | POA: Diagnosis not present

## 2023-06-14 DIAGNOSIS — Z808 Family history of malignant neoplasm of other organs or systems: Secondary | ICD-10-CM

## 2023-06-14 DIAGNOSIS — I6349 Cerebral infarction due to embolism of other cerebral artery: Secondary | ICD-10-CM | POA: Diagnosis not present

## 2023-06-14 DIAGNOSIS — Z7982 Long term (current) use of aspirin: Secondary | ICD-10-CM

## 2023-06-14 DIAGNOSIS — M1990 Unspecified osteoarthritis, unspecified site: Secondary | ICD-10-CM

## 2023-06-14 DIAGNOSIS — Z8249 Family history of ischemic heart disease and other diseases of the circulatory system: Secondary | ICD-10-CM | POA: Diagnosis not present

## 2023-06-14 DIAGNOSIS — D649 Anemia, unspecified: Secondary | ICD-10-CM | POA: Diagnosis not present

## 2023-06-14 DIAGNOSIS — Z515 Encounter for palliative care: Secondary | ICD-10-CM

## 2023-06-14 DIAGNOSIS — Z86711 Personal history of pulmonary embolism: Secondary | ICD-10-CM

## 2023-06-14 DIAGNOSIS — I639 Cerebral infarction, unspecified: Secondary | ICD-10-CM | POA: Diagnosis not present

## 2023-06-14 DIAGNOSIS — F0280 Dementia in other diseases classified elsewhere without behavioral disturbance: Secondary | ICD-10-CM | POA: Diagnosis present

## 2023-06-14 DIAGNOSIS — Z833 Family history of diabetes mellitus: Secondary | ICD-10-CM

## 2023-06-14 DIAGNOSIS — Z820 Family history of epilepsy and other diseases of the nervous system: Secondary | ICD-10-CM

## 2023-06-14 DIAGNOSIS — Z9049 Acquired absence of other specified parts of digestive tract: Secondary | ICD-10-CM | POA: Diagnosis not present

## 2023-06-14 DIAGNOSIS — I69320 Aphasia following cerebral infarction: Secondary | ICD-10-CM | POA: Diagnosis not present

## 2023-06-14 DIAGNOSIS — I69351 Hemiplegia and hemiparesis following cerebral infarction affecting right dominant side: Principal | ICD-10-CM

## 2023-06-14 DIAGNOSIS — F02B3 Dementia in other diseases classified elsewhere, moderate, with mood disturbance: Secondary | ICD-10-CM | POA: Diagnosis not present

## 2023-06-14 DIAGNOSIS — D709 Neutropenia, unspecified: Secondary | ICD-10-CM | POA: Diagnosis present

## 2023-06-14 DIAGNOSIS — G309 Alzheimer's disease, unspecified: Secondary | ICD-10-CM | POA: Diagnosis not present

## 2023-06-14 DIAGNOSIS — F02B0 Dementia in other diseases classified elsewhere, moderate, without behavioral disturbance, psychotic disturbance, mood disturbance, and anxiety: Secondary | ICD-10-CM | POA: Diagnosis not present

## 2023-06-14 DIAGNOSIS — Z825 Family history of asthma and other chronic lower respiratory diseases: Secondary | ICD-10-CM | POA: Diagnosis not present

## 2023-06-14 DIAGNOSIS — H53461 Homonymous bilateral field defects, right side: Secondary | ICD-10-CM | POA: Diagnosis not present

## 2023-06-14 DIAGNOSIS — H539 Unspecified visual disturbance: Secondary | ICD-10-CM | POA: Diagnosis present

## 2023-06-14 DIAGNOSIS — E785 Hyperlipidemia, unspecified: Secondary | ICD-10-CM | POA: Diagnosis present

## 2023-06-14 DIAGNOSIS — G301 Alzheimer's disease with late onset: Secondary | ICD-10-CM | POA: Diagnosis not present

## 2023-06-14 DIAGNOSIS — Z66 Do not resuscitate: Secondary | ICD-10-CM | POA: Diagnosis not present

## 2023-06-14 DIAGNOSIS — I63439 Cerebral infarction due to embolism of unspecified posterior cerebral artery: Secondary | ICD-10-CM | POA: Diagnosis not present

## 2023-06-14 DIAGNOSIS — I69398 Other sequelae of cerebral infarction: Secondary | ICD-10-CM | POA: Diagnosis not present

## 2023-06-14 DIAGNOSIS — D630 Anemia in neoplastic disease: Secondary | ICD-10-CM | POA: Diagnosis present

## 2023-06-14 DIAGNOSIS — Z7189 Other specified counseling: Secondary | ICD-10-CM | POA: Diagnosis not present

## 2023-06-14 DIAGNOSIS — C771 Secondary and unspecified malignant neoplasm of intrathoracic lymph nodes: Secondary | ICD-10-CM | POA: Diagnosis not present

## 2023-06-14 DIAGNOSIS — Z86718 Personal history of other venous thrombosis and embolism: Secondary | ICD-10-CM

## 2023-06-14 DIAGNOSIS — Z885 Allergy status to narcotic agent status: Secondary | ICD-10-CM

## 2023-06-14 DIAGNOSIS — R278 Other lack of coordination: Secondary | ICD-10-CM | POA: Diagnosis not present

## 2023-06-14 DIAGNOSIS — M47819 Spondylosis without myelopathy or radiculopathy, site unspecified: Secondary | ICD-10-CM | POA: Diagnosis not present

## 2023-06-14 DIAGNOSIS — I679 Cerebrovascular disease, unspecified: Secondary | ICD-10-CM

## 2023-06-14 DIAGNOSIS — C3491 Malignant neoplasm of unspecified part of right bronchus or lung: Secondary | ICD-10-CM | POA: Diagnosis not present

## 2023-06-14 DIAGNOSIS — I6340 Cerebral infarction due to embolism of unspecified cerebral artery: Secondary | ICD-10-CM | POA: Diagnosis not present

## 2023-06-14 DIAGNOSIS — R4189 Other symptoms and signs involving cognitive functions and awareness: Secondary | ICD-10-CM | POA: Diagnosis present

## 2023-06-14 DIAGNOSIS — R918 Other nonspecific abnormal finding of lung field: Secondary | ICD-10-CM

## 2023-06-14 DIAGNOSIS — R590 Localized enlarged lymph nodes: Secondary | ICD-10-CM

## 2023-06-14 DIAGNOSIS — N179 Acute kidney failure, unspecified: Secondary | ICD-10-CM | POA: Diagnosis not present

## 2023-06-14 DIAGNOSIS — M4802 Spinal stenosis, cervical region: Secondary | ICD-10-CM | POA: Diagnosis not present

## 2023-06-14 DIAGNOSIS — K5900 Constipation, unspecified: Secondary | ICD-10-CM | POA: Diagnosis not present

## 2023-06-14 DIAGNOSIS — Z79899 Other long term (current) drug therapy: Secondary | ICD-10-CM

## 2023-06-14 DIAGNOSIS — K5901 Slow transit constipation: Secondary | ICD-10-CM | POA: Diagnosis not present

## 2023-06-14 DIAGNOSIS — R159 Full incontinence of feces: Secondary | ICD-10-CM | POA: Diagnosis present

## 2023-06-14 DIAGNOSIS — I2699 Other pulmonary embolism without acute cor pulmonale: Secondary | ICD-10-CM | POA: Diagnosis present

## 2023-06-14 DIAGNOSIS — R7303 Prediabetes: Secondary | ICD-10-CM | POA: Diagnosis present

## 2023-06-14 DIAGNOSIS — K219 Gastro-esophageal reflux disease without esophagitis: Secondary | ICD-10-CM | POA: Diagnosis present

## 2023-06-14 HISTORY — PX: BRONCHIAL NEEDLE ASPIRATION BIOPSY: SHX5106

## 2023-06-14 HISTORY — PX: VIDEO BRONCHOSCOPY WITH ENDOBRONCHIAL ULTRASOUND: SHX6177

## 2023-06-14 HISTORY — PX: BRONCHIAL BRUSHINGS: SHX5108

## 2023-06-14 LAB — BASIC METABOLIC PANEL WITH GFR
Anion gap: 11 (ref 5–15)
BUN: 16 mg/dL (ref 8–23)
CO2: 23 mmol/L (ref 22–32)
Calcium: 8.8 mg/dL — ABNORMAL LOW (ref 8.9–10.3)
Chloride: 102 mmol/L (ref 98–111)
Creatinine, Ser: 0.95 mg/dL (ref 0.44–1.00)
GFR, Estimated: >60 mL/min (ref >60)
Glucose, Bld: 110 mg/dL — ABNORMAL HIGH (ref 70–99)
Potassium: 4.3 mmol/L (ref 3.5–5.1)
Sodium: 136 mmol/L (ref 135–145)

## 2023-06-14 LAB — CBC
HCT: 29.6 % — ABNORMAL LOW (ref 36.0–46.0)
Hemoglobin: 9.3 g/dL — ABNORMAL LOW (ref 12.0–15.0)
MCH: 25.5 pg — ABNORMAL LOW (ref 26.0–34.0)
MCHC: 31.4 g/dL (ref 30.0–36.0)
MCV: 81.3 fL (ref 80.0–100.0)
Platelets: 272 10*3/uL (ref 150–400)
RBC: 3.64 MIL/uL — ABNORMAL LOW (ref 3.87–5.11)
RDW: 16.6 % — ABNORMAL HIGH (ref 11.5–15.5)
WBC: 3.9 10*3/uL — ABNORMAL LOW (ref 4.0–10.5)
nRBC: 0 % (ref 0.0–0.2)

## 2023-06-14 SURGERY — BRONCHOSCOPY, WITH EBUS
Anesthesia: General | Laterality: Bilateral

## 2023-06-14 MED ORDER — ASPIRIN 81 MG PO TBEC
81.0000 mg | DELAYED_RELEASE_TABLET | Freq: Every day | ORAL | Status: DC
  Administered 2023-06-15 – 2023-06-24 (×10): 81 mg via ORAL
  Filled 2023-06-14 (×10): qty 1

## 2023-06-14 MED ORDER — FLEET ENEMA RE ENEM: 1.0000 | ENEMA | Freq: Once | RECTAL | Status: DC | PRN

## 2023-06-14 MED ORDER — LACTATED RINGERS IV SOLN: INTRAVENOUS | Status: DC | PRN

## 2023-06-14 MED ORDER — ATORVASTATIN CALCIUM 20 MG PO TABS: 20.0000 mg | ORAL_TABLET | Freq: Every day | ORAL | Status: DC

## 2023-06-14 MED ORDER — MELATONIN 5 MG PO TABS
5.0000 mg | ORAL_TABLET | Freq: Every evening | ORAL | Status: DC | PRN
  Administered 2023-06-17: 5 mg via ORAL
  Filled 2023-06-14: qty 1

## 2023-06-14 MED ORDER — DONEPEZIL HCL 10 MG PO TABS
10.0000 mg | ORAL_TABLET | Freq: Every day | ORAL | Status: DC
  Administered 2023-06-14 – 2023-06-23 (×10): 10 mg via ORAL
  Filled 2023-06-14 (×10): qty 1

## 2023-06-14 MED ORDER — PROCHLORPERAZINE EDISYLATE 10 MG/2ML IJ SOLN: 5.0000 mg | Freq: Four times a day (QID) | INTRAMUSCULAR | Status: DC | PRN

## 2023-06-14 MED ORDER — ACETAMINOPHEN 325 MG PO TABS: 325.0000 mg | ORAL_TABLET | ORAL | Status: DC | PRN

## 2023-06-14 MED ORDER — ENSURE ENLIVE PO LIQD
237.0000 mL | Freq: Two times a day (BID) | ORAL | Status: DC
  Administered 2023-06-15 – 2023-06-21 (×5): 237 mL via ORAL

## 2023-06-14 MED ORDER — DEXMEDETOMIDINE HCL IN NACL 80 MCG/20ML IV SOLN
INTRAVENOUS | Status: DC | PRN
  Administered 2023-06-14 (×2): 8 ug via INTRAVENOUS

## 2023-06-14 MED ORDER — ATORVASTATIN CALCIUM 10 MG PO TABS
20.0000 mg | ORAL_TABLET | Freq: Every day | ORAL | Status: DC
  Administered 2023-06-15 – 2023-06-24 (×10): 20 mg via ORAL
  Filled 2023-06-14 (×10): qty 2

## 2023-06-14 MED ORDER — BISACODYL 10 MG RE SUPP
10.0000 mg | Freq: Every day | RECTAL | Status: DC | PRN
  Administered 2023-06-17: 10 mg via RECTAL
  Filled 2023-06-14: qty 1

## 2023-06-14 MED ORDER — ROCURONIUM BROMIDE 10 MG/ML (PF) SYRINGE
PREFILLED_SYRINGE | INTRAVENOUS | Status: DC | PRN
  Administered 2023-06-14: 40 mg via INTRAVENOUS

## 2023-06-14 MED ORDER — PANTOPRAZOLE SODIUM 40 MG PO TBEC: 40.0000 mg | DELAYED_RELEASE_TABLET | Freq: Every day | ORAL | Status: DC

## 2023-06-14 MED ORDER — VITAMIN B-12 1000 MCG PO TABS: 1000.0000 ug | ORAL_TABLET | Freq: Every day | ORAL | Status: DC

## 2023-06-14 MED ORDER — ENOXAPARIN SODIUM 60 MG/0.6ML IJ SOSY
60.0000 mg | PREFILLED_SYRINGE | Freq: Two times a day (BID) | INTRAMUSCULAR | Status: DC
  Administered 2023-06-15 – 2023-06-24 (×18): 60 mg via SUBCUTANEOUS
  Filled 2023-06-14 (×20): qty 0.6

## 2023-06-14 MED ORDER — ALUM & MAG HYDROXIDE-SIMETH 200-200-20 MG/5ML PO SUSP: 30.0000 mL | ORAL | Status: DC | PRN

## 2023-06-14 MED ORDER — GUAIFENESIN-DM 100-10 MG/5ML PO SYRP: 5.0000 mL | ORAL_SOLUTION | Freq: Four times a day (QID) | ORAL | Status: DC | PRN

## 2023-06-14 MED ORDER — SODIUM CHLORIDE 0.9 % IV SOLN
INTRAVENOUS | Status: AC | PRN
  Administered 2023-06-14: 500 mL via INTRAMUSCULAR

## 2023-06-14 MED ORDER — DEXAMETHASONE SODIUM PHOSPHATE 10 MG/ML IJ SOLN
INTRAMUSCULAR | Status: DC | PRN
  Administered 2023-06-14: 10 mg via INTRAVENOUS

## 2023-06-14 MED ORDER — ALPRAZOLAM 0.5 MG PO TABS
0.5000 mg | ORAL_TABLET | Freq: Every day | ORAL | Status: DC | PRN
  Administered 2023-06-15 – 2023-06-19 (×2): 0.5 mg via ORAL
  Filled 2023-06-14 (×2): qty 1

## 2023-06-14 MED ORDER — ONDANSETRON HCL 4 MG PO TABS: 4.0000 mg | ORAL_TABLET | Freq: Four times a day (QID) | ORAL | 0 refills | Status: DC | PRN

## 2023-06-14 MED ORDER — PANTOPRAZOLE SODIUM 40 MG PO TBEC
40.0000 mg | DELAYED_RELEASE_TABLET | Freq: Every day | ORAL | Status: DC
  Administered 2023-06-15 – 2023-06-24 (×10): 40 mg via ORAL
  Filled 2023-06-14 (×10): qty 1

## 2023-06-14 MED ORDER — SODIUM CHLORIDE 0.9 % IV SOLN: INTRAVENOUS | Status: AC

## 2023-06-14 MED ORDER — ONDANSETRON HCL 4 MG/2ML IJ SOLN
INTRAMUSCULAR | Status: DC | PRN
  Administered 2023-06-14: 4 mg via INTRAVENOUS

## 2023-06-14 MED ORDER — PROCHLORPERAZINE MALEATE 5 MG PO TABS: 5.0000 mg | ORAL_TABLET | Freq: Four times a day (QID) | ORAL | Status: DC | PRN

## 2023-06-14 MED ORDER — LIDOCAINE 2% (20 MG/ML) 5 ML SYRINGE
INTRAMUSCULAR | Status: DC | PRN
  Administered 2023-06-14: 60 mg via INTRAVENOUS

## 2023-06-14 MED ORDER — ENOXAPARIN SODIUM 60 MG/0.6ML IJ SOSY: 60.0000 mg | PREFILLED_SYRINGE | Freq: Two times a day (BID) | INTRAMUSCULAR | Status: DC

## 2023-06-14 MED ORDER — DIPHENHYDRAMINE HCL 25 MG PO CAPS: 25.0000 mg | ORAL_CAPSULE | Freq: Four times a day (QID) | ORAL | Status: DC | PRN

## 2023-06-14 MED ORDER — SUGAMMADEX SODIUM 200 MG/2ML IV SOLN
INTRAVENOUS | Status: DC | PRN
  Administered 2023-06-14: 200 mg via INTRAVENOUS

## 2023-06-14 MED ORDER — PROPOFOL 10 MG/ML IV BOLUS
INTRAVENOUS | Status: DC | PRN
  Administered 2023-06-14: 100 mg via INTRAVENOUS
  Administered 2023-06-14: 150 ug/kg/min via INTRAVENOUS

## 2023-06-14 MED ORDER — SODIUM CHLORIDE 0.9 % IV SOLN: INTRAVENOUS | Status: DC

## 2023-06-14 MED ORDER — PROCHLORPERAZINE 25 MG RE SUPP
12.5000 mg | Freq: Four times a day (QID) | RECTAL | Status: DC | PRN
  Filled 2023-06-14: qty 1

## 2023-06-14 MED ORDER — ENSURE ENLIVE PO LIQD: 237.0000 mL | Freq: Two times a day (BID) | ORAL | Status: DC

## 2023-06-14 NOTE — Discharge Summary (Signed)
 Physician Discharge Summary  Tara Martin HYQ:[id number] DOB: 17-Apr-1940 DOA: 06/08/2023  PCP: Tara Covey, MD  Admit date: 06/08/2023 Discharge date: 06/14/2023  Admitted From: (Home) Disposition:  (CIR)  Recommendations for Outpatient Follow-up:  Management per CIR Please resume full dose Lovenox for anticoagulation/11/2018 5 AM   Diet recommendation: Heart Healthy  Brief/Interim Summary: Tara Martin is a 83 y.o. female with medical history significant of anxiety, osteoarthritis, varicella-zoster, systemic lupus erythematosus, mild cognitive impairment, late onset Alzheimer's, migraine headaches, prediabetes who was admitted at the beginning of the year with pulmonary embolism and diagnosed with right middle lobe mass who is presenting to the emergency department via EMS with progressively worse mental status associated with right-sided weakness, confusion and unsteady gait, her workup significant for acute CVA, admitted for further workup   Acute CVA (cerebrovascular accident) [facility]) -Patient MRI significant for acute CVA, most likely of cardioembolic source, it is concerning that it happened while on Eliquis, which may indicate hypercoagulable status  (please see discussion below under pulmonary mass) -PT, OT, SLP consulted, recommendation for CIR -started on heparin GTT, stroke protocol, currently on full dose Lovenox, stroke on hold as plan for navigational bronchoscopy today, she can resume full dose Lovenox tomorrow morning, with plan to stay on full dose Lovenox if biopsy confirms diagnosis.       Moderate late onset Alzheimer's dementia   without behavioral disturbance,  psychotic disturbance, mood disturbance, or anxiety (HCC) Continue donezepil 10 mg p.o. at bedtime. Supportive care. B12 is borderline, continue with supplements -Follows with [facility] Dr. Vickey Martin   SLE (systemic lupus erythematosus related syndrome) (HCC) Does not seem to be active. She is not  currently on medications.    Prediabetes A1c is 5.5  Hyperlipidemia -LDL 82, started on low-dose Lipitor    Pulmonary mass - PCCM consulted, concern for malignancy, so plan for navigational bronchoscopy this afternoon, full dose Lovenox has been on hold, can be resumed tomorrow.    Pulmonary embolism (HCC) The patient has been on apixaban twice daily.  Heparin GTT>> full dose Lovenox   Normocytic anemia Low B12 level -Hemoglobin 7.3, transfused 1 unit, appropriate response, hemoglobin is stable. - workup significant for B12 of 273, ferritin borderline low at 31, IV iron can be considered as an outpatient.  Discharge Diagnoses:  Principal Problem:   Acute CVA (cerebrovascular accident) (HCC) Active Problems:   SLE (systemic lupus erythematosus related syndrome) (HCC)   Prediabetes   Pulmonary embolism (HCC)   Moderate late onset Alzheimer's dementia without behavioral disturbance, psychotic disturbance, mood disturbance, or anxiety (HCC)   Normocytic anemia    Discharge Instructions  Discharge Instructions     Ambulatory referral to Neurology   Complete by: As directed    Follow up with Dr. Vickey Martin at [facility] in 4-6 weeks.  Patient is Tara Martin patient.  Thanks.   Diet - low sodium heart healthy   Complete by: As directed    Discharge instructions   Complete by: As directed    Management per CIR   Increase activity slowly   Complete by: As directed       Allergies as of 06/14/2023       Reactions   Codeine Other (See Comments)   Per patient, this made her "flip out"        Medication List     STOP taking these medications    apixaban 5 MG Tabs tablet Commonly known as: ELIQUIS       TAKE these medications  ALPRAZolam 0.5 MG tablet Commonly known as: XANAX Take 0.5 mg by mouth daily as needed for anxiety.   atorvastatin 20 MG tablet Commonly known as: LIPITOR Take 1 tablet (20 mg total) by mouth daily. Start taking on: June 15, 2023    cyanocobalamin 1000 MCG tablet Commonly known as: VITAMIN B12 Take 1 tablet (1,000 mcg total) by mouth daily.   diclofenac sodium 1 % Gel Commonly known as: VOLTAREN Apply 3 gm to 3 large joints up to 3 times a day.Dispense 3 tubes with 3 refills. What changed:  how much to take how to take this when to take this reasons to take this additional instructions   donepezil 10 MG tablet Commonly known as: Aricept Take 1 tablet (10 mg total) by mouth at bedtime.   enoxaparin 60 MG/0.6ML injection Commonly known as: LOVENOX Inject 0.6 mLs (60 mg total) into the skin every 12 (twelve) hours. Start taking on: June 15, 2023   feeding supplement Liqd Take 237 mLs by mouth 2 (two) times daily between meals.   Multivitamin Women 50+ Tabs Take 1 tablet by mouth daily with breakfast.   ondansetron 4 MG tablet Commonly known as: ZOFRAN Take 1 tablet (4 mg total) by mouth every 6 (six) hours as needed for nausea.   pantoprazole 40 MG tablet Commonly known as: PROTONIX Take 1 tablet (40 mg total) by mouth daily. Start taking on: June 15, 2023   Refresh Optive Advanced PF 0.5-1-0.5 % Soln Generic drug: Carboxymeth-Glyc-Polysorb PF Place 1 drop into both eyes 3 (three) times daily as needed (for irritation).        Follow-up Information     Martin, Tara Mylar, MD. Schedule an appointment as soon as possible for a visit in 1 month(s).   Specialty: Neurology Contact information: [address] [place] [address] [phone]                Allergies  Allergen Reactions   Codeine Other (See Comments)    Per patient, this made her "flip out"    Consultations: Neurology Pulmonary   Procedures/Studies: VAS US CAROTID Result Date: 06/11/2023 Carotid Arterial Duplex Study Patient Name:  Tara Martin  Date of Exam:   06/09/2023 Medical Rec #: [id number]         Accession #:    [id number] Date of Birth: February 20, 1941         Patient Gender: F Patient Age:   5  years Exam Location:  [facility] Procedure:      VAS US CAROTID Referring Phys: DAVID ORTIZ --------------------------------------------------------------------------------  Indications:       CVA, Speech disturbance, Numbness and Weakness. Risk Factors:      Hyperlipidemia, no history of smoking. Other Factors:     Late onset Alzheimer's, Lupus, right middle lobe lung mass,                    question malignancy. Comparison Study:  No prior study Performing Technologist: Sherren Kerns RVS  Examination Guidelines: A complete evaluation includes B-mode imaging, spectral Doppler, color Doppler, and power Doppler as needed of all accessible portions of each vessel. Bilateral testing is considered an integral part of a complete examination. Limited examinations for reoccurring indications may be performed as noted.  Right Carotid Findings: +----------+--------+--------+--------+------------------+--------+           PSV cm/sEDV cm/sStenosisPlaque DescriptionComments +----------+--------+--------+--------+------------------+--------+ CCA Prox  98      19                                         +----------+--------+--------+--------+------------------+--------+  CCA Distal78      16                                         +----------+--------+--------+--------+------------------+--------+ ICA Prox  75      27              heterogenous               +----------+--------+--------+--------+------------------+--------+ ICA Mid   82      27                                         +----------+--------+--------+--------+------------------+--------+ ICA Distal130     33                                tortuous +----------+--------+--------+--------+------------------+--------+ ECA       77      9                                          +----------+--------+--------+--------+------------------+--------+ +----------+--------+-------+--------+-------------------+            PSV cm/sEDV cmsDescribeArm Pressure (mmHG) +----------+--------+-------+--------+-------------------+ Subclavian101                                        +----------+--------+-------+--------+-------------------+ +---------+--------+--+--------+--+ VertebralPSV cm/s60EDV cm/s17 +---------+--------+--+--------+--+  Left Carotid Findings: +----------+--------+--------+--------+------------------+--------+           PSV cm/sEDV cm/sStenosisPlaque DescriptionComments +----------+--------+--------+--------+------------------+--------+ CCA Prox  84      16                                         +----------+--------+--------+--------+------------------+--------+ CCA Distal70      17                                         +----------+--------+--------+--------+------------------+--------+ ICA Prox  38      10              heterogenous               +----------+--------+--------+--------+------------------+--------+ ICA Mid   89      26                                         +----------+--------+--------+--------+------------------+--------+ ICA Distal79      25                                         +----------+--------+--------+--------+------------------+--------+ ECA       114     11                                         +----------+--------+--------+--------+------------------+--------+ +----------+--------+--------+--------+-------------------+  PSV cm/sEDV cm/sDescribeArm Pressure (mmHG) +----------+--------+--------+--------+-------------------+ Subclavian69                                          +----------+--------+--------+--------+-------------------+ +---------+--------+--+--------+--+ VertebralPSV cm/s41EDV cm/s15 +---------+--------+--+--------+--+   Summary: Right Carotid: The extracranial vessels were near-normal with only minimal wall                thickening or plaque. Left Carotid: The extracranial  vessels were near-normal with only minimal wall               thickening or plaque. Vertebrals:  Bilateral vertebral arteries demonstrate antegrade flow. Subclavians: Normal flow hemodynamics were seen in bilateral subclavian              arteries. *See table(s) above for measurements and observations.  Electronically signed by Delia Heady MD on 06/11/2023 at 11:02:14 AM.    Final    CT CHEST WO CONTRAST Result Date: 06/09/2023 CLINICAL DATA:  Pleural mass EXAM: CT CHEST WITHOUT CONTRAST TECHNIQUE: Multidetector CT imaging of the chest was performed following the standard protocol without IV contrast. RADIATION DOSE REDUCTION: This exam was performed according to the departmental dose-optimization program which includes automated exposure control, adjustment of the mA and/or kV according to patient size and/or use of iterative reconstruction technique. COMPARISON:  03/09/2023 FINDINGS: Cardiovascular: Heart is normal size. Aorta is normal caliber. Small pericardial effusion. Scattered coronary artery and aortic calcifications. Mediastinum/Nodes: Mediastinal adenopathy is stable since prior study. Difficult to assess the hila right without IV contrast, but right hilar fullness is suspicious for adenopathy. No axillary adenopathy. Trachea and esophagus are unremarkable. Thyroid unremarkable. Lungs/Pleura: Medial right middle lobe mass again noted measuring 4.0 x 2.2 cm compared to 2.6 x 1.3 cm on prior study when measured at the same level and in the same planes. This is highly suspicious for primary lung cancer. Anterior right middle lobe nodule measures 8 mm compared to 7 mm previously. Dependent atelectasis or scarring. No effusions. Upper Abdomen: No acute findings Musculoskeletal: Chest wall soft tissues are unremarkable. No acute bony abnormality. IMPRESSION: Medial right middle lobe mass has enlarged since prior study, now measuring approximately 4.0 x 2.2 cm compared to 2.6 x 1.3 cm previously. This is most  compatible with primary lung cancer. This could be further evaluated with PET CT. Stable mediastinal adenopathy. Difficult to assess the hila without IV contrast, but right hilar fullness is suspicious for adenopathy. Bibasilar atelectasis or scarring. Small pericardial effusion. Aortic Atherosclerosis (ICD10-I70.0). Electronically Signed   By: Charlett Nose M.D.   On: 06/09/2023 19:34   ECHOCARDIOGRAM COMPLETE BUBBLE STUDY Result Date: 06/09/2023    ECHOCARDIOGRAM REPORT   Patient Name:   KAROLE OO Laidlaw Date of Exam: 06/09/2023 Medical Rec #:  [id number]        Height:       65.0 in Accession #:    [id number]       Weight:       135.0 lb Date of Birth:  06-Aug-1940        BSA:          1.674 m Patient Age:    82 years         BP:           158/83 mmHg Patient Gender: F                HR:  63 bpm. Exam Location:  Inpatient Procedure: 2D Echo, Color Doppler, Cardiac Doppler and Saline Contrast Bubble            Study (Both Spectral and Color Flow Doppler were utilized during            procedure). Indications:    Stroke 434.91 / I63.9  History:        Patient has prior history of Echocardiogram examinations, most                 recent 03/10/2023.  Sonographer:    Harriette Bouillon RDCS Referring Phys: [phone] DAVID MANUEL ORTIZ IMPRESSIONS  1. Left ventricular ejection fraction, by estimation, is 60 to 65%. Left ventricular ejection fraction by PLAX is 64 %. The left ventricle has normal function. The left ventricle has no regional wall motion abnormalities. Left ventricular diastolic parameters are consistent with Grade I diastolic dysfunction (impaired relaxation).  2. Right ventricular systolic function is normal. The right ventricular size is normal.  3. There is no evidence of cardiac tamponade.  4. The mitral valve is abnormal. Mild mitral valve regurgitation.  5. The aortic valve is tricuspid. Aortic valve regurgitation is not visualized.  6. The inferior vena cava is normal in size with greater than 50%  respiratory variability, suggesting right atrial pressure of 3 mmHg.  7. Doppler suspicious of left to right atrial level shunting by color flow Doppler. However, agitated saline contrast bubble study was negative, with no evidence of right to left interatrial shunt. There is a likely a patent foramen ovale with predominantly left to right shunting across the atrial septum. Comparison(s): Changes from prior study are noted. 03/10/2023: LVEF 60-65%. Conclusion(s)/Recommendation(s): Findings suggest possible PFO with left to right shunting, however, no right to left shunting seen by saline microbubble contrast - even with valsalva. An area of bubble "Washout" was noted at the atrial septum, which could be due to brisk left to right flow. Consider further evaluation with transesophageal echocardiogram and bubble study which would provide more definitive images. FINDINGS  Left Ventricle: Left ventricular ejection fraction, by estimation, is 60 to 65%. Left ventricular ejection fraction by PLAX is 64 %. The left ventricle has normal function. The left ventricle has no regional wall motion abnormalities. The left ventricular internal cavity size was normal in size. There is no left ventricular hypertrophy. Left ventricular diastolic parameters are consistent with Grade I diastolic dysfunction (impaired relaxation). Indeterminate filling pressures. Right Ventricle: The right ventricular size is normal. No increase in right ventricular wall thickness. Right ventricular systolic function is normal. Left Atrium: Left atrial size was normal in size. Right Atrium: Right atrial size was normal in size. Prominent Chiari network. Pericardium: Trivial pericardial effusion is present. The pericardial effusion is circumferential. There is no evidence of cardiac tamponade. Mitral Valve: The mitral valve is abnormal. Mild mitral valve regurgitation. Tricuspid Valve: The tricuspid valve is grossly normal. Tricuspid valve regurgitation is  trivial. Aortic Valve: The aortic valve is tricuspid. Aortic valve regurgitation is not visualized. Pulmonic Valve: The pulmonic valve was normal in structure. Pulmonic valve regurgitation is not visualized. Aorta: The aortic root and ascending aorta are structurally normal, with no evidence of dilitation. Venous: The inferior vena cava is normal in size with greater than 50% respiratory variability, suggesting right atrial pressure of 3 mmHg. IAS/Shunts: The interatrial septum is aneurysmal. Evidence of atrial level shunting detected by color flow Doppler. Agitated saline contrast was given intravenously to evaluate for intracardiac shunting. Agitated saline contrast bubble  study was negative, with no evidence of any interatrial shunt. A moderately sized patent foramen ovale is detected with predominantly left to right shunting across the atrial septum.  LEFT VENTRICLE PLAX 2D LV EF:         Left            Diastology                ventricular     LV e' medial:    5.98 cm/s                ejection        LV E/e' medial:  11.5                fraction by     LV e' lateral:   9.03 cm/s                PLAX is 64      LV E/e' lateral: 7.6                %. LVIDd:         4.10 cm LVIDs:         2.70 cm LV PW:         1.00 cm LV IVS:        1.00 cm LVOT diam:     2.00 cm LV SV:         65 LV SV Index:   39 LVOT Area:     3.14 cm  RIGHT VENTRICLE             IVC RV S prime:     19.90 cm/s  IVC diam: 1.40 cm TAPSE (M-mode): 1.7 cm LEFT ATRIUM           Index        RIGHT ATRIUM           Index LA diam:      3.60 cm 2.15 cm/m   RA Area:     10.10 cm LA Vol (A2C): 38.0 ml 22.70 ml/m  RA Volume:   19.80 ml  11.83 ml/m LA Vol (A4C): 34.8 ml 20.79 ml/m  AORTIC VALVE LVOT Vmax:   86.20 cm/s LVOT Vmean:  59.300 cm/s LVOT VTI:    0.207 m  AORTA Ao Root diam: 3.10 cm Ao Asc diam:  3.20 cm MITRAL VALVE MV Area (PHT): 2.35 cm    SHUNTS MV Decel Time: 323 msec    Systemic VTI:  0.21 m MV E velocity: 68.70 cm/s  Systemic Diam:  2.00 cm MV A velocity: 87.50 cm/s MV E/A ratio:  0.79 Zoila Shutter MD Electronically signed by Zoila Shutter MD Signature Date/Time: 06/09/2023/4:47:04 PM    Final    CT ANGIO HEAD NECK W WO CM Result Date: 06/09/2023 CLINICAL DATA:  83 year old female neurologic deficit. Multifocal infarcts in the anterior and posterior circulation on MRI yesterday. EXAM: CT ANGIOGRAPHY HEAD AND NECK WITH AND WITHOUT CONTRAST TECHNIQUE: Multidetector CT imaging of the head and neck was performed using the standard protocol during bolus administration of intravenous contrast. Multiplanar CT image reconstructions and MIPs were obtained to evaluate the vascular anatomy. Carotid stenosis measurements (when applicable) are obtained utilizing NASCET criteria, using the distal internal carotid diameter as the denominator. RADIATION DOSE REDUCTION: This exam was performed according to the departmental dose-optimization program which includes automated exposure control, adjustment of the mA and/or kV according to patient size and/or use of iterative reconstruction technique. CONTRAST:  60mL  OMNIPAQUE IOHEXOL 350 MG/ML SOLN COMPARISON:  Brain MRI and head CT yesterday. CTA chest 03/09/2023. FINDINGS: CT HEAD Brain: Hypodense cytotoxic edema most pronounced in the left PCA territory, stable there from the CT yesterday. Increased visualization of cytotoxic edema in the left thalamus since yesterday (series 5, image 14). Other small areas of abnormal diffusion on MRI remain occult by CT. No acute intracranial hemorrhage identified. No acute intracranial hemorrhage identified. Stable ventricle size and configuration. Normal basilar cisterns. Calvarium and skull base: Intact, stable. Paranasal sinuses: Visualized paranasal sinuses and mastoids are stable and well aerated. Orbits: No acute orbit or scalp soft tissue finding. CTA NECK Skeleton: Cervical spine degeneration. No acute osseous abnormality identified. Upper chest: Paraseptal emphysema  or scattered pneumatocele is. Abnormal bulky superior mediastinal lymphadenopathy, appears malignant (series 10, image 168) and progressed since January. Suspected primary right middle lobe lung mass at that time not included today. Other neck: Thoracic inlet metastatic lymphadenopathy, bilateral lower neck level 4 stations is progressed since January (series 10, image 141). No other metastatic disease identified in the neck. Aortic arch: 3 vessel arch.  No significant arch atherosclerosis. Right carotid system: Tortuosity. Patent with no significant plaque or stenosis through the right carotid bifurcation. 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 The saccular component is also roughly 8 mm. No associated stenosis. Left carotid system: Tortuous left CCA and patent left carotid bifurcation without stenosis. However, attenuated right ICA distal to the bulb, circumferentially narrow due to 2-3 mm diameter. This remains patent to the skull base, with a small area of fusiform aneurysmal dilatation measuring 5-6 mm just below the skull base series 17, image 30. Vertebral arteries: Proximal subclavian arteries, cervical vertebral arteries are patent with tortuosity, no significant plaque or stenosis to the skull base. CTA HEAD Posterior circulation: Right vertebral artery is mildly dominant throughout. Patent distal vertebral arteries and vertebrobasilar junction with tortuosity but no significant plaque or stenosis. Patent PICA origins. Patent basilar artery without stenosis. Patent SCA and left PCA origins. Fetal type right PCA origin. Prominent left posterior communicating artery also, which in light of the below left ICA siphon findings is probably collateral supply to the left ICA terminus. Left PCA is severely irregular and attenuated beginning at the junction of the  P1 and posterior communicating artery, series 15, image 20, and P3 branches are poorly enhancing. Contralateral right PCA branches appear more normal, mildly irregular. Anterior circulation:  Both ICA siphons are patent. But the left petrous ICA is highly irregular and stenotic as seen on series 12, image 126, with probable soft plaque (series 14, image 152). Left ICA remains patent, appears more normal in the cavernous and supraclinoid segments. Contralateral right ICA siphon is ectatic throughout, patent without stenosis. Both posterior communicating artery origins are normal. Patent carotid termini, MCA and ACA origins. Left ACA A1 is dominant and mildly irregular. Anterior communicating artery is diminutive but patent. Bilateral ACA branches are patent with mild irregularity. Right MCA M1 segment and bifurcation are patent without stenosis. Left MCA M1 segment and bifurcation are patent without stenosis. Bilateral MCA branches are patent with mild irregularity. Venous sinuses: Patent. Anatomic variants: Dominant right vertebral artery, right ACA A1, fetal type right PCA origin. Review of the MIP images confirms the above findings IMPRESSION: 1. Progressed since January and bulky Metastatic Lymphadenopathy in the  visible mediastinum, bilateral lower neck/thoracic inlet. 2. Positive for Left PCA severe stenosis, ultimately occluded in the P3 segments, concordant with acute ischemia demonstrated by MRI. 3. Positive also for highly abnormal bilateral Internal Carotid Arteries: - Attenuated, Stenotic cervical Left ICA and Left siphon, including High-grade stenosis in the petrous segment. Evidence of collateral supply to the Left ICA terminus from the posterior communicating artery, posterior circulation. 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 4. No other hemodynamically significant arterial  stenosis in the head or neck. 5. Expected CT appearance of ischemia demonstrated by MRI yesterday. No hemorrhagic transformation or intracranial mass effect. Electronically Signed   By: Odessa Fleming M.D.   On: 06/09/2023 09:21   MR BRAIN WO CONTRAST Result Date: 06/08/2023 CLINICAL DATA:  Acute neurologic deficit EXAM: MRI HEAD WITHOUT CONTRAST TECHNIQUE: Multiplanar, multiecho pulse sequences of the brain and surrounding structures were obtained without intravenous contrast. COMPARISON:  None Available. FINDINGS: Brain: Multifocal abnormal diffusion restriction, including the left occipital lobe, left temporal lobe, left thalamus and within the bilateral parietal subcortical white matter. Petechial hemorrhage within the posterior right hemisphere. There is multifocal hyperintense T2-weighted signal within the white matter. Generalized volume loss. The midline structures are normal. Vascular: Normal flow voids. Skull and upper cervical spine: Normal calvarium and skull base. Visualized upper cervical spine and soft tissues are normal. Sinuses/Orbits:No paranasal sinus fluid levels or advanced mucosal thickening. No mastoid or middle ear effusion. Normal orbits. IMPRESSION: 1. Multifocal acute ischemia, including the left occipital lobe, left temporal lobe, left thalamus and within the bilateral parietal subcortical white matter. No hemorrhage or mass effect. 2. Petechial hemorrhage within the posterior right hemisphere. Electronically Signed   By: Deatra Robinson M.D.   On: 06/08/2023 20:06   CT Head Wo Contrast Addendum Date: 06/08/2023 ADDENDUM REPORT: 06/08/2023 16:43 ADDENDUM: These results were called by telephone at the time of interpretation on 06/08/2023 at 4:37 pm to provider Dr. Rodena Medin, who verbally acknowledged these results. Electronically Signed   By: Darliss Cheney M.D.   On: 06/08/2023 16:43   Result Date: 06/08/2023 CLINICAL DATA:  Mental status change EXAM: CT HEAD WITHOUT CONTRAST TECHNIQUE: Contiguous  axial images were obtained from the base of the skull through the vertex without intravenous contrast. RADIATION DOSE REDUCTION: This exam was performed according to the departmental dose-optimization program which includes automated exposure control, adjustment of the mA and/or kV according to patient size and/or use of iterative reconstruction technique. COMPARISON:  MRI brain 02/24/2021.  Head CT 02/02/2020. FINDINGS: Brain: Focal area of hypodensity with loss of gray-white matter distinction is seen in the left occipital lobe, new from prior. No significant mass effect or midline shift, but there is associated sulcal effacement. No acute intracranial hemorrhage or hydrocephalus. There is mild diffuse atrophy. There is a small old cortical infarct in the high right parietal region which is new from 2021. Vascular: No hyperdense vessel or unexpected calcification. Skull: Normal. Negative for fracture or focal lesion. Sinuses/Orbits: No acute finding. Other: None. IMPRESSION: 1. Acute to subacute infarct in the left occipital lobe, new from prior. No significant mass effect or midline shift. No acute hemorrhage. 2. Small old cortical infarct in the high right parietal region, new from 2021. Electronically Signed: By: Darliss Cheney M.D. On: 06/08/2023 16:32   CT Cervical Spine Wo Contrast Result Date: 06/08/2023 CLINICAL DATA:  Trauma EXAM: CT CERVICAL SPINE WITHOUT CONTRAST  TECHNIQUE: Multidetector CT imaging of the cervical spine was performed without intravenous contrast. Multiplanar CT image reconstructions were also generated. RADIATION DOSE REDUCTION: This exam was performed according to the departmental dose-optimization program which includes automated exposure control, adjustment of the mA and/or kV according to patient size and/or use of iterative reconstruction technique. COMPARISON:  Cervical spine CT 02/02/2020 FINDINGS: Alignment: There is 2 mm of anterolisthesis at C7-T1 which is favored is  degenerative and unchanged. Alignment is otherwise anatomic. Skull base and vertebrae: No acute fracture. No primary bone lesion or focal pathologic process. Soft tissues and spinal canal: No prevertebral fluid or swelling. No visible canal hematoma. Disc levels: There is moderate severe disc space narrowing and endplate osteophyte formation at C4-C5, C5-C6 and C6-C7 compatible with degenerative change. There is diffuse bilateral facet arthropathy causing mild neural foraminal stenosis most significant at C3-C4, C4-C5 and C5-C6 on right. Posterior disc osteophyte complexes are seen at C4-C5, C5-C6 and C6-C7 causing mild central canal stenosis. Upper chest: Negative. Other: None. IMPRESSION: 1. No acute fracture or traumatic subluxation of the cervical spine. 2. Multilevel degenerative changes of the cervical spine. Electronically Signed   By: Darliss Cheney M.D.   On: 06/08/2023 16:36      Subjective: No significant events overnight as discussed with staff, patient denies any complaints today  Discharge Exam: Vitals:   06/14/23 0800 06/14/23 1231  BP: (!) 118/51 128/61  Pulse: 69 70  Resp: 16 14  Temp: 98.3 F (36.8 C) 98.6 F (37 C)  SpO2: 93% 96%   Vitals:   06/13/23 1933 06/14/23 0000 06/14/23 0800 06/14/23 1231  BP: 135/61  (!) 118/51 128/61  Pulse: 86 69 69 70  Resp: 17 20 16 14   Temp: 98.2 F (36.8 C) 98 F (36.7 C) 98.3 F (36.8 C) 98.6 F (37 C)  TempSrc: Oral Oral Oral Oral  SpO2: 98% 95% 93% 96%  Weight:      Height:        General: Pt is alert, awake, she is presently demented, no apparent distress Cardiovascular: RRR, S1/S2 +, no rubs, no gallops Respiratory: CTA bilaterally, no wheezing, no rhonchi Abdominal: Soft, NT, ND, bowel sounds + Extremities: no edema, no cyanosis    The results of significant diagnostics from this hospitalization (including imaging, microbiology, ancillary and laboratory) are listed below for reference.     Microbiology: No results  found for this or any previous visit (from the past 240 hours).   Labs: BNP (last 3 results) No results for input(s): "BNP" in the last 8760 hours. Basic Metabolic Panel: Recent Labs  Lab 06/08/23 1305 06/08/23 1317 06/10/23 0410 06/11/23 0529 06/12/23 0531 06/13/23 0418 06/14/23 0431  NA 135   < > 137 137 138 137 136  K 3.3*   < > 3.9 4.0 3.8 4.0 4.3  CL 104   < > 107 106 107 106 102  CO2 20*   < > 19* 20* 21* 21* 23  GLUCOSE 105*   < > 101* 106* 97 123* 110*  BUN 16   < > 10 14 15 16 16   CREATININE 0.84   < > 0.90 0.93 1.01* 0.99 0.95  CALCIUM 9.2   < > 8.8* 8.8* 8.9 8.8* 8.8*  MG 2.1  --   --   --   --   --   --   PHOS 3.7  --   --   --   --   --   --    < > =  values in this interval not displayed.   Liver Function Tests: Recent Labs  Lab 06/08/23 1305 06/09/23 0422  AST 22 19  ALT 11 10  ALKPHOS 75 65  BILITOT 0.6 0.3  PROT 7.8 6.2*  ALBUMIN 3.7 2.9*   No results for input(s): "LIPASE", "AMYLASE" in the last 168 hours. No results for input(s): "AMMONIA" in the last 168 hours. CBC: Recent Labs  Lab 06/08/23 1305 06/08/23 1317 06/10/23 0410 06/11/23 0529 06/12/23 0531 06/13/23 0418 06/14/23 0431  WBC 5.8   < > 4.4 4.5 4.9 5.3 3.9*  NEUTROABS 4.0  --   --   --   --   --   --   HGB 8.0*   < > 9.3* 9.4* 9.5* 9.4* 9.3*  HCT 26.8*   < > 28.6* 29.0* 30.3* 30.2* 29.6*  MCV 81.5   < > 80.3 80.1 82.3 82.7 81.3  PLT 236   < > 220 240 283 270 272   < > = values in this interval not displayed.   Cardiac Enzymes: No results for input(s): "CKTOTAL", "CKMB", "CKMBINDEX", "TROPONINI" in the last 168 hours. BNP: Invalid input(s): "POCBNP" CBG: Recent Labs  Lab 06/08/23 1246 06/08/23 2109  GLUCAP 90 89   D-Dimer No results for input(s): "DDIMER" in the last 72 hours. Hgb A1c No results for input(s): "HGBA1C" in the last 72 hours. Lipid Profile No results for input(s): "CHOL", "HDL", "LDLCALC", "TRIG", "CHOLHDL", "LDLDIRECT" in the last 72 hours. Thyroid  function studies No results for input(s): "TSH", "T4TOTAL", "T3FREE", "THYROIDAB" in the last 72 hours.  Invalid input(s): "FREET3" Anemia work up No results for input(s): "VITAMINB12", "FOLATE", "FERRITIN", "TIBC", "IRON", "RETICCTPCT" in the last 72 hours. Urinalysis    Component Value Date/Time   COLORURINE YELLOW 06/08/2023 1658   APPEARANCEUR HAZY (A) 06/08/2023 1658   LABSPEC 1.017 06/08/2023 1658   PHURINE 6.0 06/08/2023 1658   GLUCOSEU NEGATIVE 06/08/2023 1658   GLUCOSEU NEGATIVE 10/22/2013 1027   HGBUR NEGATIVE 06/08/2023 1658   BILIRUBINUR NEGATIVE 06/08/2023 1658   BILIRUBINUR negative 11/05/2014 1717   KETONESUR NEGATIVE 06/08/2023 1658   PROTEINUR 30 (A) 06/08/2023 1658   UROBILINOGEN 0.2 11/05/2014 1717   UROBILINOGEN 0.2 10/22/2013 1027   NITRITE POSITIVE (A) 06/08/2023 1658   LEUKOCYTESUR TRACE (A) 06/08/2023 1658   Sepsis Labs Recent Labs  Lab 06/11/23 0529 06/12/23 0531 06/13/23 0418 06/14/23 0431  WBC 4.5 4.9 5.3 3.9*   Microbiology No results found for this or any previous visit (from the past 240 hours).   Time coordinating discharge: Over 30 minutes  SIGNED:   Huey Bienenstock, MD  [facility] 06/14/2023, 12:49 PM Pager   If 7PM-7AM, please contact night-coverage [email or web address] Password [email or web address]

## 2023-06-14 NOTE — Progress Notes (Signed)
  Inpatient Rehabilitation Admissions Coordinator   I have insurance approval and CIR bed to admit to after recovery from biopsy today if ok with [facility]. I met with patient and spouse at bedside and they are aware. Acute team and TOC made aware. I will make the arrangements.  Ottie Glazier, RN, MSN Rehab Admissions Coordinator [phone] 06/14/2023 12:07 PM

## 2023-06-14 NOTE — Progress Notes (Signed)
 Pt taken to CIR, report called.

## 2023-06-14 NOTE — Op Note (Signed)
 Video Bronchoscopy with Endobronchial Ultrasound Procedure Note  Date of Operation: 06/14/2023  Pre-op Diagnosis: Right middle lobe mass, mediastinal adenopathy  Post-op Diagnosis: Same  Surgeon: Levy Pupa  Assistants: None 1  Anesthesia: General endotracheal anesthesia  Operation: Flexible video fiberoptic bronchoscopy with endobronchial ultrasound and biopsies.  Estimated Blood Loss: Minimal  Complications: None apparent  Indications and History: Tara Martin is a 83 y.o. female with history of SLE, Alzheimer's, pulmonary embolism, recent hospitalization for acute CVA (while on Eliquis).  Started on enoxaparin.  Noted to have a right middle lobe mass, mediastinal adenopathy on CT chest at the time of her pulmonary embolism diagnosis in January 2025.  She has been on anticoagulation since then and it is now held so she can undergo bronchoscopy to obtain a tissue diagnosis.  Recommendation made to perform bronchoscopy with endobronchial ultrasound and biopsies.  The risks, benefits, complications, treatment options and expected outcomes were discussed with the patient.  The possibilities of pneumothorax, pneumonia, reaction to medication, pulmonary aspiration, perforation of a viscus, bleeding, failure to diagnose a condition and creating a complication requiring transfusion or operation were discussed with the patient who freely signed the consent.    Description of Procedure: The patient was examined in the preoperative area and history and data from the preprocedure consultation were reviewed. It was deemed appropriate to proceed.  The patient was taken to [facility] endoscopy room 3, identified as Tara Martin and the procedure verified as Flexible Video Fiberoptic Bronchoscopy.  A Time Out was held and the above information confirmed. After being taken to the operating room general anesthesia was initiated and the patient  was orally intubated. The video fiberoptic bronchoscope  was introduced via the endotracheal tube and a general inspection was performed which showed normal airways throughout.  Endobronchial brushings were performed in the anterior segment of the right middle lobe to be evaluated for cytology.  There was some mild bleeding with the brushings that stopped quickly. The standard scope was then withdrawn and the endobronchial ultrasound was used to identify and characterize the peritracheal, hilar and bronchial lymph nodes. Inspection showed significant enlargement at station 4L, 4R, 7, 11R. Using real-time ultrasound guidance Wang needle biopsies were take from Station 4L node and were sent for cytology. The patient tolerated the procedure well without apparent complications. There was no significant blood loss. The bronchoscope was withdrawn. Anesthesia was reversed and the patient was taken to the PACU for recovery.   Samples: 1. Wang needle biopsies from 4L node 2.  Endobronchial brushings for anterior segment of the right middle lobe   Plans:  The patient will be discharged from the PACU to [facility] inpatient rehab when recovered from anesthesia. We will review the cytology, pathology and microbiology results with the patient when they become available. Outpatient followup will be with Saralyn Pilar, NP and Dr. Delton Coombes.   Levy Pupa, MD, PhD 06/14/2023, 3:30 PM [facility] [phone] or if no answer before 7:00PM call [phone] For any issues after 7:00PM please call eLink [phone]

## 2023-06-14 NOTE — Interval H&P Note (Signed)
 History and Physical Interval Note:  06/14/2023 2:33 PM  Tara Martin  has presented today for surgery, with the diagnosis of Mediastinal Adenopathy, Right Lung Nodule.  The various methods of treatment have been discussed with the patient and family. After consideration of risks, benefits and other options for treatment, the patient has consented to  Procedure(s): BRONCHOSCOPY, WITH EBUS (Bilateral), POSSIBLE ROBOTIC ASSISTED NAVIGATIONAL BRONCHOSCOPY as a surgical intervention.  The patient's history has been reviewed, patient examined, no change in status, stable for surgery.  I have reviewed the patient's chart and labs.  Questions were answered to the patient's satisfaction.     Leslye Peer

## 2023-06-14 NOTE — Progress Notes (Addendum)
 Tara Rouge, MD  Physician Physical Medicine and Rehabilitation   PMR Pre-admission    Signed   Date of Service: 06/10/2023  4:16 PM  Related encounter: ED to Hosp-Admission (Current) from 06/08/2023 in [facility] ENDOSCOPY   Signed     Expand All Collapse All  Show:Clear all [x] Written[x] Templated[x] Copied  Added by: [x] Standley Brooking, RN  [] Hover for details PMR Admission Coordinator Pre-Admission Assessment   Patient: Tara Martin is an 83 y.o., female MRN: [id number] DOB: 02-18-1941 Height: 5\' 5"  (165.1 cm) Weight: 58.3 kg                                                                                                                       Insurance Information HMO:     PPO: yes     PCP:      IPA:      80/20:      OTHER:  PRIMARY: [employer] Medicare HMO/PPO      Policy#: [id number]      Subscriber: pt CM Name: Tiffany      Phone#: [phone]     Fax#: [phone] Pre-Cert#: [id number]  approved 4/8 until 4/18    Employer:  Benefits:  Phone #: [phone]     Name: 4/4 Eff. Date: 03/09/23     Deduct: none      Out of Pocket Max: $4150      Life Max: none  CIR: $300 co pay per day days 1 until 6      SNF: $10 co pay per day days 1 until 20; $214 co pay per day days 21 until 100 Outpatient: $10 per visit     Co-Pay:  Home Health: 100%      Co-Pay:  DME: 80%     Co-Pay: 20% Providers: in network  SECONDARY: none      Policy#:       Phone#:    Artist:       Phone#:    The Data processing manager" for patients in Inpatient Rehabilitation Facilities with attached "Privacy Act Statement-Health Care Records" was provided and verbally reviewed with: Patient and Family   Emergency Contact Information Contact Information       Name Relation Home Work [place] D [place]     [phone]    Appleby,Kelli Daughter     [phone]         Other Contacts       Name Relation Home Work Mobile     [place] Friend     [phone]         Current Medical History  Patient Admitting Diagnosis: CVA   History of Present Illness:  Tara Martin is a 83 y.o. 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 She was admitted for confusion altered mental status and speech difficulty on 06/08/23.    Code stroke was  initiated and CT of the head demonstrated a left occipital lobe infarct.  CTA of the head and neck revealed severe left PCA stenosis, occluded at the left P3.  She also has an attenuated stenotic cervical left ICA and left siphon on.  There is evidence of collaterals supplied to the left ICA terminus from the posterior communicating artery and posterior circulation.  MRI of the brain noted multifocal acute ischemia involving the left occipital lobe, left temporal lobe, left thalamus and within the bilateral parietal subcortical white matter.     Right middle lung lobe mass pending 06/14/23. On full dose Lovenox pending bx. Has been on Apixaban twice daily due to PEs. Long term anticoagulation plan pending.   Complete NIHSS TOTAL: 9 Glasgow Coma Scale Score: 15   Patient's medical record from [facility] has been reviewed by the rehabilitation admission coordinator and physician.   Past Medical History      Past Medical History:  Diagnosis Date   Anxiety state, unspecified - sees Dr. Jennette Kettle in gyn and benzo prescribed there 04/09/2013   Arthritis     Chicken pox     Hx of blood clots      jan 2025   Lupus     MCI (mild cognitive impairment) 05/20/2020   Migraines     Prediabetes          Has the patient had major surgery during 100 days prior to admission? yes   Family History  family history includes Alcoholism in an other family member; Alzheimer's disease in her brother and sister; Arthritis in her mother and another family member; COPD in her sister; Diabetes in  her mother; Heart attack in her mother; Heart disease in her brother and mother; Hypertension in an other family member; Migraines in her daughter; Pneumonia in her father; Pulmonary embolism in her brother; Sudden death in an other family member.   Current Medications   Current Medications    Current Facility-Administered Medications:    0.9 %  sodium chloride infusion, , Intravenous, Continuous, Elgergawy, Leana Roe, MD, Last Rate: 75 mL/hr at 06/14/23 0933, New Bag at 06/14/23 0933   acetaminophen (TYLENOL) tablet 650 mg, 650 mg, Oral, Q6H PRN **OR** acetaminophen (TYLENOL) suppository 650 mg, 650 mg, Rectal, Q6H PRN, Bobette Mo, MD   ALPRAZolam Prudy Feeler) tablet 0.5 mg, 0.5 mg, Oral, Daily PRN, Elgergawy, Leana Roe, MD, 0.5 mg at 06/11/23 2015   aspirin EC tablet 81 mg, 81 mg, Oral, Daily, Milon Dikes, MD, 81 mg at 06/13/23 1026   atorvastatin (LIPITOR) tablet 20 mg, 20 mg, Oral, Daily, Marvel Plan, MD, 20 mg at 06/13/23 1026   donepezil (ARICEPT) tablet 10 mg, 10 mg, Oral, QHS, Elgergawy, Leana Roe, MD, 10 mg at 06/13/23 2016   [START ON 06/15/2023] enoxaparin (LOVENOX) injection 60 mg, 60 mg, Subcutaneous, Q12H, Lorin Glass, MD   feeding supplement (ENSURE ENLIVE / ENSURE PLUS) liquid 237 mL, 237 mL, Oral, BID BM, Elgergawy, Leana Roe, MD, 237 mL at 06/12/23 1710   ondansetron (ZOFRAN) tablet 4 mg, 4 mg, Oral, Q6H PRN **OR** ondansetron (ZOFRAN) injection 4 mg, 4 mg, Intravenous, Q6H PRN, Bobette Mo, MD   pantoprazole (PROTONIX) EC tablet 40 mg, 40 mg, Oral, Daily, Elgergawy, Leana Roe, MD, 40 mg at 06/13/23 1026     Patients Current Diet:  Diet Order                  Diet NPO time specified  Diet effective midnight  Precautions / Restrictions Precautions Precautions: Fall (Simultaneous filing. User may not have seen previous data.) Restrictions Weight Bearing Restrictions Per Provider Order: No    Has the patient had 2 or more falls or a  fall with injury in the past year?No   Prior Activity Level Community (5-7x/wk): I without AD   Prior Functional Level Prior Function Prior Level of Function : Independent/Modified Independent, Driving Mobility Comments: Pt reports no use of AD prior to hospitalization. ADLs Comments: ind   Self Care: Did the patient need help bathing, dressing, using the toilet or eating?  Independent   Indoor Mobility: Did the patient need assistance with walking from room to room (with or without device)? Independent   Stairs: Did the patient need assistance with internal or external stairs (with or without device)? Independent   Functional Cognition: Did the patient need help planning regular tasks such as shopping or remembering to take medications? Independent   Patient Information Are you of Hispanic, Latino/a,or Spanish origin?: A. No, not of Hispanic, Latino/a, or Spanish origin What is your race?: A. White Do you need or want an interpreter to communicate with a doctor or health care staff?: 0. No   Patient's Response To:  Health Literacy and Transportation Is the patient able to respond to health literacy and transportation needs?: Yes Health Literacy - How often do you need to have someone help you when you read instructions, pamphlets, or other written material from your doctor or pharmacy?: Never In the past 12 months, has lack of transportation kept you from medical appointments or from getting medications?: No In the past 12 months, has lack of transportation kept you from meetings, work, or from getting things needed for daily living?: No   Journalist, newspaper / Equipment Home Equipment: BSC/3in1, Information systems manager   Prior Device Use: Indicate devices/aids used by the patient prior to current illness, exacerbation or injury?  cane   Current Functional Level Cognition   Arousal/Alertness: Awake/alert Overall Cognitive Status: Impaired/Different from baseline Orientation Level:  Oriented to person, Oriented to place Attention: Sustained Sustained Attention: Impaired Sustained Attention Impairment: Verbal basic, Functional basic Memory: Impaired Memory Impairment: Other (comment) (impacted by aphasia) Awareness: Impaired    Extremity Assessment (includes Sensation/Coordination)   Upper Extremity Assessment: RUE deficits/detail RUE Deficits / Details: good strength, poor awareness and coordination due to recent stroke, R side neglect. Significant difficulty holding items or performing functional tasks. Right side more affected. RUE: Shoulder pain with ROM RUE Sensation: decreased light touch RUE Coordination: decreased fine motor, decreased gross motor LUE Deficits / Details: Pt uses L side in a more coordinated fashion but is accustomed to being R handed so is using R hand more than left. LUE Coordination: decreased fine motor, decreased gross motor  Lower Extremity Assessment: Defer to PT evaluation RLE Deficits / Details: Ataxic, Ankle DF 4/5, PF 5/5, knee extension 4+/5 hip extension 4+/5, hip flexion 4/5, knee flexion 4+/5 RLE Sensation: decreased light touch, decreased proprioception RLE Coordination: decreased gross motor, decreased fine motor     ADLs   Overall ADL's : Needs assistance/impaired Eating/Feeding: Moderate assistance, Sitting, Cueing for sequencing Eating/Feeding Details (indicate cue type and reason): Pt using cup with increased independence. Vision limits abiltiy to feed self without help. Pt often cannot find food on plate despite cues and using clock to orient self to plate. Pt unsure of clock set up and thefore these cues are not effective. Pt apraxia and incoordiation in RUE making feeding self R handed  difficult. Grooming: Moderate assistance, Standing, Cueing for sequencing, Oral care Grooming Details (indicate cue type and reason): Pt stood at sink to groom. Unable to sequence or plan the task without max cues. Pt unable to find items  on countertop even if they were on her L and harder time finding them on the R.  Once pt got started with brushing, pt brushed with min assist tomove toothbrush around in hand correctly due to apraxia. Upper Body Bathing: Moderate assistance, Standing Lower Body Bathing: Maximal assistance, Cueing for sequencing, Sitting/lateral leans Upper Body Dressing : Maximal assistance, Cueing for sequencing, Sitting Lower Body Dressing: Maximal assistance, Cueing for sequencing, Sitting/lateral leans Lower Body Dressing Details (indicate cue type and reason): Pt required step by step cues to donn socks in sitting. Physically required min assist. Toilet Transfer: Minimal assistance, Rolling walker (2 wheels) Toilet Transfer Details (indicate cue type and reason): simulated Toileting- Clothing Manipulation and Hygiene: Maximal assistance, Cueing for safety, Cueing for sequencing, Sitting/lateral lean, Sit to/from stand Functional mobility during ADLs: Moderate assistance, Rolling walker (2 wheels) General ADL Comments: Pt very limited due to poor vision, R inattention and motor planning and sequencing deficits.     Mobility   Overal bed mobility: Needs Assistance Bed Mobility: Supine to Sit Supine to sit: HOB elevated, Contact guard Sit to supine: Min assist, HOB elevated General bed mobility comments: pt in chair on arrival (Simultaneous filing. User may not have seen previous data.)     Transfers   Overall transfer level: Needs assistance (Simultaneous filing. User may not have seen previous data.) Equipment used: Rolling walker (2 wheels) (Simultaneous filing. User may not have seen previous data.) Transfers: Sit to/from Stand, Bed to chair/wheelchair/BSC (Simultaneous filing. User may not have seen previous data.) Sit to Stand: Min assist (Simultaneous filing. User may not have seen previous data.) Bed to/from chair/wheelchair/BSC transfer type:: Step pivot Step pivot transfers: Min assist General  transfer comment: cues for hand placement and safety and to get hands positioned on walker correctly before walking. (Simultaneous filing. User may not have seen previous data.)     Ambulation / Gait / Stairs / Wheelchair Mobility   Ambulation/Gait Ambulation/Gait assistance: Editor, commissioning (Feet): 100 Feet Assistive device: Rolling walker (2 wheels) Gait Pattern/deviations: Step-through pattern, Decreased stride length, Ataxic, Drifts right/left, Wide base of support General Gait Details: Intermittent min assist for RW control and to guide body into RW for proper alignment - drifts outside of walker BOS with turns. Needs intermittent cues for awareness of objects/obstacles on Rt multimodal cues to avoid 50% of the time avoiding on her own with only verbal cues. No overt buckling. VSS. Gait velocity: dec Gait velocity interpretation: <1.31 ft/sec, indicative of household ambulator     Posture / Balance Dynamic Sitting Balance Sitting balance - Comments: EOB (Simultaneous filing. User may not have seen previous data.) Balance Overall balance assessment: Needs assistance (Simultaneous filing. User may not have seen previous data.) Sitting-balance support: No upper extremity supported, Feet supported (Simultaneous filing. User may not have seen previous data.) Sitting balance-Leahy Scale: Fair (Simultaneous filing. User may not have seen previous data.) Sitting balance - Comments: EOB (Simultaneous filing. User may not have seen previous data.) Standing balance support: Bilateral upper extremity supported, During functional activity (Simultaneous filing. User may not have seen previous data.) Standing balance-Leahy Scale: Poor (Simultaneous filing. User may not have seen previous data.) Standing balance comment: min assist for all tasks in standing when at the sink. (Simultaneous filing. User may  not have seen previous data.)     Special needs/care consideration Spouse has LTC policy  and plans to hire caregiver supports at home. Will need some guidance.        Previous Home Environment  Living Arrangements: Spouse/significant other  Lives With: Spouse Available Help at Discharge: Family, Available 24 hours/day Type of Home: House Home Layout: Two level, Able to live on main level with bedroom/bathroom Home Access: Stairs to enter Entrance Stairs-Rails: Left Entrance Stairs-Number of Steps: 3 on the side and 5 in the front one rail on side with brick wall and bil rails in front Bathroom Shower/Tub: Engineer, manufacturing systems: Handicapped height Bathroom Accessibility: Yes How Accessible: Accessible via walker Home Care Services: No Additional Comments: lives with spouse who is visually impaired. Daughter reports she can stay as long as needed   Discharge Living Setting Plans for Discharge Living Setting: Patient's home, Lives with (comment) (spouse) Type of Home at Discharge: Mobile home Discharge Home Layout: Two level, Able to live on main level with bedroom/bathroom Discharge Home Access: Stairs to enter Entrance Stairs-Rails: Left Entrance Stairs-Number of Steps: 3 on the side and 5 in the front Discharge Bathroom Shower/Tub: Tub/shower unit Discharge Bathroom Toilet: Handicapped height Discharge Bathroom Accessibility: Yes How Accessible: Accessible via walker Does the patient have any problems obtaining your medications?: No   Social/Family/Support Systems Patient Roles: Spouse, Parent Contact Information: spouse and daughter Anticipated Caregiver: spouse and daughter Anticipated Caregiver's Contact Information: see contacts Ability/Limitations of Caregiver: spouse is legally blind but very active and independent Caregiver Availability: 24/7 Discharge Plan Discussed with Primary Caregiver: Yes Is Caregiver In Agreement with Plan?: Yes Does Caregiver/Family have Issues with Lodging/Transportation while Pt is in Rehab?: No    Goals Patient/Family Goal for Rehab: supervision with PT, OT and SLP Expected length of stay: ELOS 12 to 17 days Pt/Family Agrees to Admission and willing to participate: Yes Program Orientation Provided & Reviewed with Pt/Caregiver Including Roles  & Responsibilities: Yes   Decrease burden of Care through IP rehab admission: n/a   Possible need for SNF placement upon discharge:may if not reach supervision level   Patient Condition: This patient's condition remains as documented in the consult dated 06/10/23, in which the Rehabilitation Physician determined and documented that the patient's condition is appropriate for intensive rehabilitative care in an inpatient rehabilitation facility. Will admit to inpatient rehab today.   Preadmission Screen Completed By:  Clois Dupes, RN MSN 06/14/2023 12:14 PM ______________________________________________________________________   Discussed status with Dr. Berline Chough on 06/14/23 at 1224 and received approval for admission today.   Admission Coordinator:  Clois Dupes RN MSN, time 1224 Date 06/14/23            Revision History  Routing History

## 2023-06-14 NOTE — Anesthesia Procedure Notes (Signed)
 Procedure Name: Intubation Date/Time: 06/14/2023 2:55 PM  Performed by: Stanton Kidney, CRNAPre-anesthesia Checklist: Patient identified, Emergency Drugs available, Suction available and Patient being monitored Patient Re-evaluated:Patient Re-evaluated prior to induction Oxygen Delivery Method: Circle system utilized Preoxygenation: Pre-oxygenation with 100% oxygen Induction Type: IV induction Ventilation: Mask ventilation without difficulty Laryngoscope Size: McGrath and 4 Grade View: Grade I Tube type: Oral Tube size: 8.5 mm Number of attempts: 1 Airway Equipment and Method: Stylet and Oral airway Placement Confirmation: ETT inserted through vocal cords under direct vision, positive ETCO2 and breath sounds checked- equal and bilateral Tube secured with: Tape Dental Injury: Teeth and Oropharynx as per pre-operative assessment

## 2023-06-14 NOTE — H&P (Signed)
 Physical Medicine and Rehabilitation Admission H&P        Chief Complaint  Patient presents with   Functional deficits due to stroke and advancement of dementia.       HPI: Tara Martin is an 83 year old RH female with history of anxiety d/o, migraines, lupus, Dx of RML lung mass, DVT and bilateral PE's 03/2023-on eliquis, late onset Alzheimer's with advancement (since Jan) who presented to ED on 06/08/23 with acute onset of MS changes, difficulty speaking and weakness. 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   MRI brain done revealing multifocal acute ischemia left occipital lobe, left temporal lobe, left thalamus and within bilateral parietal subcortical white matter and petechial hemorrhage within posterior right hemisphere. CT cervical spine with bilateral facet arthropathy with mild foraminal stenosis C3-C6 and mild central canal stenosis C4-C7.  Eliquis placed on hold, ASA added and Pulmonary consulted for work up of spiculated Lung mass. Dr. Roda Shutters felt that stoke concerning for cardioembolic source v/s hypercarbic state from advanced malignancy. IV heparin added per stroke protocol pending biopsy and to transition to therapeutic dose lovenox after a day.  She underwent bronchoscopy today with reportedly positive RML airway and 4 nodes on contralateral side.  Final path pending. She continues to be limited by significant visual deficits w/ right neglect, fluent aphasia with poor awareness of deficits, right sided weakness and requires min assist with ADL/mobility. She was fully independent prior to Jan admission and CIR recommended due to functional decline.    Pt reports she doesn't know what she's going for ( a biopsy) and I  repeated myself and explained what it was 3 different times- she was unable to remember by the time I left the room in 15 minutes.    Review of Systems  Unable to perform ROS: Mental acuity  Cardiovascular:  Negative for chest pain.  Gastrointestinal:        Incontinence of bowel.   Neurological:  Negative for dizziness and headaches.  All other systems reviewed and are negative.         Past Medical History:  Diagnosis Date   Anxiety state, unspecified - sees Dr. Jennette Kettle in gyn and benzo prescribed there 04/09/2013   Arthritis     Chicken pox     Hx of blood clots      jan 2025   Lupus     MCI (mild cognitive impairment) 05/20/2020   Migraines     Prediabetes                 Past Surgical History:  Procedure Laterality Date   BACK SURGERY   2000   CHOLECYSTECTOMY       DILATION AND CURETTAGE OF UTERUS   1973   GALLBLADDER SURGERY   1974               Family History  Problem Relation Age of Onset   Arthritis Mother     Diabetes Mother     Heart disease Mother     Heart attack Mother     Pneumonia Father     COPD Sister     Alzheimer's disease Sister     Heart disease Brother     Pulmonary embolism Brother     Alzheimer's disease  Brother     Migraines Daughter     Alcoholism Other     Arthritis Other     Hypertension Other     Sudden death Other     Colon cancer Neg Hx     Esophageal cancer Neg Hx     Stomach cancer Neg Hx     Colon polyps Neg Hx            Social History:  Married. Retired Psychologist, occupational. Husband is visually impaired and daughter has been helping them out since Jan? She reports that she has never smoked. She has never used smokeless tobacco. She reports that she does not currently use alcohol after a past usage of about 2.0 standard drinks of alcohol per week. She reports that she does not use drugs.     Allergies       Allergies  Allergen Reactions   Codeine Other (See Comments)      Per patient, this made her "flip out"               Medications Prior to Admission  Medication Sig Dispense Refill   ALPRAZolam (XANAX) 0.5 MG tablet Take 0.5 mg by mouth daily as needed for anxiety.       apixaban (ELIQUIS) 5 MG TABS tablet Take 1 tablet (5 mg total) by mouth 2 (two) times daily. 60 tablet 5   diclofenac sodium (VOLTAREN) 1 % GEL Apply 3 gm to 3 large joints up to 3 times a day.Dispense 3 tubes with 3 refills. (Patient taking differently: Apply 3 g topically 3 (three) times daily as needed (for pain- large joints).) 3 Tube 1   donepezil (ARICEPT) 10 MG tablet Take 1 tablet (10 mg total) by mouth at bedtime. 90 tablet 1   Multiple Vitamins-Minerals (MULTIVITAMIN WOMEN 50+) TABS Take 1 tablet by mouth daily with breakfast.       REFRESH OPTIVE ADVANCED PF 0.5-1-0.5 % SOLN Place 1 drop into both eyes 3 (three) times daily as needed (for irritation).                Home: Home Living Family/patient expects to be discharged to:: Private residence Living Arrangements: Spouse/significant other Available Help at Discharge: Family, Available 24 hours/day Type of Home: House Home Access: Stairs to enter [employer] of Steps: 3 on the side and 5 in the front one rail on side with brick wall and bil rails in front Entrance Stairs-Rails: Left Home Layout: Two level, Able to live on main level with bedroom/bathroom Bathroom Shower/Tub: Engineer, manufacturing systems: Handicapped height Bathroom Accessibility: Yes Home Equipment: BSC/3in1, Shower seat Additional Comments: lives with spouse who is visually impaired. Daughter reports she can stay as long as needed  Lives With: Spouse   Functional History: Prior Function Prior Level of Function : Independent/Modified Independent, Driving Mobility Comments: Pt reports no use of AD prior to hospitalization. ADLs Comments: ind   Functional Status:  Mobility: Bed Mobility Overal bed mobility: Needs Assistance Bed Mobility: Supine to Sit Supine to sit: HOB elevated,  Contact guard Sit to supine: Min assist, HOB elevated General bed mobility comments: pt in chair on arrival (Simultaneous filing. User may not have seen previous data.) Transfers Overall transfer level: Needs assistance (Simultaneous filing. User may not have seen previous data.) Equipment used: Rolling walker (2 wheels) (Simultaneous filing. User may not have seen previous data.) Transfers: Sit to/from Stand, Bed to chair/wheelchair/BSC (Simultaneous filing. User may not have seen previous data.) Sit to Stand: Min  assist (Simultaneous filing. User may not have seen previous data.) Bed to/from chair/wheelchair/BSC transfer type:: Step pivot Step pivot transfers: Min assist General transfer comment: cues for hand placement and safety and to get hands positioned on walker correctly before walking. (Simultaneous filing. User may not have seen previous data.) Ambulation/Gait Ambulation/Gait assistance: Min assist Gait Distance (Feet): 100 Feet Assistive device: Rolling walker (2 wheels) Gait Pattern/deviations: Step-through pattern, Decreased stride length, Ataxic, Drifts right/left, Wide base of support General Gait Details: Intermittent min assist for RW control and to guide body into RW for proper alignment - drifts outside of walker BOS with turns. Needs intermittent cues for awareness of objects/obstacles on Rt multimodal cues to avoid 50% of the time avoiding on her own with only verbal cues. No overt buckling. VSS. Gait velocity: dec Gait velocity interpretation: <1.31 ft/sec, indicative of household ambulator   ADL: ADL Overall ADL's : Needs assistance/impaired Eating/Feeding: Moderate assistance, Sitting, Cueing for sequencing Eating/Feeding Details (indicate cue type and reason): Pt using cup with increased independence. Vision limits abiltiy to feed self without help. Pt often cannot find food on plate despite cues and using clock to orient self to plate. Pt unsure of clock set up and  thefore these cues are not effective. Pt apraxia and incoordiation in RUE making feeding self R handed difficult. Grooming: Moderate assistance, Standing, Cueing for sequencing, Oral care Grooming Details (indicate cue type and reason): Pt stood at sink to groom. Unable to sequence or plan the task without max cues. Pt unable to find items on countertop even if they were on her L and harder time finding them on the R.  Once pt got started with brushing, pt brushed with min assist tomove toothbrush around in hand correctly due to apraxia. Upper Body Bathing: Moderate assistance, Standing Lower Body Bathing: Maximal assistance, Cueing for sequencing, Sitting/lateral leans Upper Body Dressing : Maximal assistance, Cueing for sequencing, Sitting Lower Body Dressing: Maximal assistance, Cueing for sequencing, Sitting/lateral leans Lower Body Dressing Details (indicate cue type and reason): Pt required step by step cues to donn socks in sitting. Physically required min assist. Toilet Transfer: Minimal assistance, Rolling walker (2 wheels) Toilet Transfer Details (indicate cue type and reason): simulated Toileting- Clothing Manipulation and Hygiene: Maximal assistance, Cueing for safety, Cueing for sequencing, Sitting/lateral lean, Sit to/from stand Functional mobility during ADLs: Moderate assistance, Rolling walker (2 wheels) General ADL Comments: Pt very limited due to poor vision, R inattention and motor planning and sequencing deficits.   Cognition: Cognition Overall Cognitive Status: Impaired/Different from baseline Arousal/Alertness: Awake/alert Orientation Level: Oriented to person Attention: Sustained Sustained Attention: Impaired Sustained Attention Impairment: Verbal basic, Functional basic Memory: Impaired Memory Impairment: Other (comment) (impacted by aphasia) Awareness: Impaired Cognition Arousal: Alert (Simultaneous filing. User may not have seen previous data.) Behavior During  Therapy: [facility] for tasks assessed/performed (Simultaneous filing. User may not have seen previous data.) Overall Cognitive Status: Impaired/Different from baseline     Blood pressure 114/66, pulse 60, temperature 98.2 F (36.8 C), temperature source Temporal, resp. rate 17, height 5\' 5"  (1.651 m), weight 58.3 kg, SpO2 92%. Physical Exam Vitals and nursing note reviewed.  Constitutional:      Appearance: Normal appearance.     Comments: Clearing throat frequently.  Awake, alert, sitting up in bed; deferring to her husband at bedside who's blind, NAD  HENT:     Head: Normocephalic and atraumatic.     Comments: Smile intact and facial sensation intact per pt- but not clear if true?  Right Ear: External ear normal.     Left Ear: External ear normal.     Nose: Nose normal. No congestion.     Mouth/Throat:     Mouth: Mucous membranes are dry.     Pharynx: Oropharynx is clear. No oropharyngeal exudate.  Eyes:     Comments: L gaze preference   Cardiovascular:     Rate and Rhythm: Normal rate and regular rhythm.     Heart sounds: Normal heart sounds. No murmur heard.    No gallop.  Pulmonary:     Effort: Pulmonary effort is normal. No respiratory distress.     Breath sounds: Normal breath sounds. No wheezing.     Comments: Decreased on R base Abdominal:     General: Bowel sounds are normal. There is no distension.     Palpations: Abdomen is soft.     Tenderness: There is no abdominal tenderness.  Musculoskeletal:     Cervical back: Neck supple. No tenderness.     Comments: L side 5/5 in all muscles tested RUE 4+/5 throughout but perseverates so hard to test RLE 4/5 throughout- same as above  Skin:    General: Skin is warm and dry.     Comments: IV L forearm- covered so pt couldn't remove  Neurological:     Mental Status: She is alert.     Comments: Able to state name and DOB but unable to recall age, year '[birth year]/'[birth year], day -" May or June" or name of hospital.  Kept asking if she just  had a procedure and results. Tended to repeat herself.  She was able to follow simple one step motor commands.  RUE inattention/decreased motor control. Able to scan in all fields with cues to locate items in right lower field.  Pt thought was in hospital when given cues and in [place], but didn't know was [facility].  Thought was 2023 or 2024- May or June Knew was here for CVA, but didn't know was going for bronch/biopsy even when I rpeated them 3 different times separated by 3 minutes each Intact to light touch but R inattention   Psychiatric:     Comments: smiling        Lab Results Last 48 Hours        Results for orders placed or performed during the hospital encounter of 06/08/23 (from the past 48 hours)  CBC     Status: Abnormal    Collection Time: 06/13/23  4:18 AM  Result Value Ref Range    WBC 5.3 4.0 - 10.5 K/uL    RBC 3.65 (L) 3.87 - 5.11 MIL/uL    Hemoglobin 9.4 (L) 12.0 - 15.0 g/dL    HCT 91.4 (L) 78.2 - 46.0 %    MCV 82.7 80.0 - 100.0 fL    MCH 25.8 (L) 26.0 - 34.0 pg    MCHC 31.1 30.0 - 36.0 g/dL    RDW 95.6 (H) 21.3 - 15.5 %    Platelets 270 150 - 400 K/uL    nRBC 0.0 0.0 - 0.2 %      Comment: Performed at [facility], [address] [place], [place] [address]  Basic metabolic panel with GFR     Status: Abnormal    Collection Time: 06/13/23  4:18 AM  Result Value Ref Range    Sodium 137 135 - 145 mmol/L    Potassium 4.0 3.5 - 5.1 mmol/L    Chloride 106 98 - 111 mmol/L    CO2  21 (L) 22 - 32 mmol/L    Glucose, Bld 123 (H) 70 - 99 mg/dL      Comment: Glucose reference range applies only to samples taken after fasting for at least 8 hours.    BUN 16 8 - 23 mg/dL    Creatinine, Ser 1.61 0.44 - 1.00 mg/dL    Calcium 8.8 (L) 8.9 - 10.3 mg/dL    GFR, Estimated 57 (L) >60 mL/min      Comment: (NOTE) Calculated using the CKD-EPI Creatinine Equation (2021)      Anion gap 10 5 - 15      Comment: Performed at [facility], [address] [place],  [place] [address]  CBC     Status: Abnormal    Collection Time: 06/14/23  4:31 AM  Result Value Ref Range    WBC 3.9 (L) 4.0 - 10.5 K/uL    RBC 3.64 (L) 3.87 - 5.11 MIL/uL    Hemoglobin 9.3 (L) 12.0 - 15.0 g/dL    HCT 54.0 (L) 98.1 - 46.0 %    MCV 81.3 80.0 - 100.0 fL    MCH 25.5 (L) 26.0 - 34.0 pg    MCHC 31.4 30.0 - 36.0 g/dL    RDW 19.1 (H) 47.8 - 15.5 %    Platelets 272 150 - 400 K/uL    nRBC 0.0 0.0 - 0.2 %      Comment: Performed at [facility], [address], [place], [place] [address]  Basic metabolic panel with GFR     Status: Abnormal    Collection Time: 06/14/23  4:31 AM  Result Value Ref Range    Sodium 136 135 - 145 mmol/L    Potassium 4.3 3.5 - 5.1 mmol/L    Chloride 102 98 - 111 mmol/L    CO2 23 22 - 32 mmol/L    Glucose, Bld 110 (H) 70 - 99 mg/dL      Comment: Glucose reference range applies only to samples taken after fasting for at least 8 hours.    BUN 16 8 - 23 mg/dL    Creatinine, Ser 1.30 0.44 - 1.00 mg/dL    Calcium 8.8 (L) 8.9 - 10.3 mg/dL    GFR, Estimated >86 >57 mL/min      Comment: (NOTE) Calculated using the CKD-EPI Creatinine Equation (2021)      Anion gap 11 5 - 15      Comment: Performed at [facility], [address], [place], [place] [address]      Imaging Results (Last 48 hours)  No results found.         Blood pressure 114/66, pulse 60, temperature 98.2 F (36.8 C), temperature source Temporal, resp. rate 17, height 5\' 5"  (1.651 m), weight 58.3 kg, SpO2 92%.   Medical Problem List and Plan: 1. 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              -patient may  shower-              -ELOS/Goals: 12-18 days supervision due to poor memory             Admit to CIR 2.  Antithrombotics: -DVT/anticoagulation:  Pharmaceutical: Lovenox             -antiplatelet therapy: ASA 3. Pain Management: Tylenol prn for pain.  4. Mood/Behavior/Sleep: LCSW  to follow for  evaluation and support.              -antipsychotic agents: N/A 5. Neuropsych/cognition: This patient is not fully capable of making decisions on of own behalf. 6. Skin/Wound Care: Routine pressure relief measures.  7. Fluids/Electrolytes/Nutrition: Monitor intake. Check CMET in am.  8. PE/DVT: To start treatment dose of lovenox in am 9. Moderate degree dementia: just dx'd Advancement past PE/hypoxia per 05/2023 visit.  --Now back on Aricept.  10. Bronch/Biopsy of 4.4 x 2+ cm R middle lung nodule that's suspicious for cancer  11. HLD- con't statin       Jacquelynn Cree, PA-C 06/14/2023   I have personally performed a face to face diagnostic evaluation of this patient and formulated the key components of the plan.  Additionally, I have personally reviewed laboratory data, imaging studies, as well as relevant notes and concur with the physician assistant's documentation above.   The patient's status has not changed from the original H&P.  Any changes in documentation from the acute care chart have been noted above.

## 2023-06-14 NOTE — Progress Notes (Signed)
 PHARMACY - ANTICOAGULATION CONSULT NOTE  Pharmacy Consult for Lovenox Indication: stroke and hx PE and DVT  Allergies  Allergen Reactions   Codeine Other (See Comments)    Per patient, this made her "flip out"    Patient Measurements: Height: 5\' 5"  (165.1 cm) Weight: 59.4 kg (130 lb 15.3 oz) IBW/kg (Calculated) : 57 HEPARIN DW (KG): 59.4  Vital Signs: Temp: 97.8 F (36.6 C) (04/08 1818) Temp Source: Oral (04/08 1818) BP: 158/74 (04/08 1818) Pulse Rate: 67 (04/08 1818)  Labs: Recent Labs    06/12/23 0531 06/13/23 0418 06/14/23 0431  HGB 9.5* 9.4* 9.3*  HCT 30.3* 30.2* 29.6*  PLT 283 270 272  CREATININE 1.01* 0.99 0.95    Estimated Creatinine Clearance: 41.8 mL/min (by C-G formula based on SCr of 0.95 mg/dL).   Medical History: Past Medical History:  Diagnosis Date   Anxiety state, unspecified - sees Dr. Jennette Kettle in gyn and benzo prescribed there 04/09/2013   Arthritis    Chicken pox    Hx of blood clots    jan 2025   Lupus    MCI (mild cognitive impairment) 05/20/2020   Migraines    Prediabetes    Assessment: 83 YO female that was admitted 06/08/2023 for CVA. Previously on Eliquis 5 mg po BID for PE (03/09/2023). Eliquis held on admit with need for biopsy for suspected lung malignancy. Pharmacy consulted for IV heparin dosing on 4/3 and then transition to full-dose Lovenox on 4/4.    Lovenox held after 4/7 am dose and lung biopsy done 4/8. To resume Lovenox on 4/9 am. Transferred from inpatient unit to CIR tonight.  Goal of Therapy:  Anti-Xa level 0.6-1 units/ml 4hrs after LMWH dose given Monitor platelets by anticoagulation protocol: Yes   Plan:  Lovenox to resumed on 4/9 am with 60 mg SQ Q12hrs. Intermittent CBC while on Lovenox.  Dennie Fetters, [place] 06/14/2023,6:41 PM

## 2023-06-14 NOTE — Progress Notes (Signed)
 Tara Oyster, MD  Physician Physical Medicine and Rehabilitation   Consult Note    Addendum   Date of Service: 06/10/2023 11:22 AM  Related encounter: ED to Hosp-Admission (Current) from 06/08/2023 in [facility] ENDOSCOPY   Expand All Collapse All  Show:Clear all [x] Written[x] Templated[] Copied  Added by: [x] Tara Oyster, MD  [] Hover for details          Physical Medicine and Rehabilitation Consult Reason for Consult: Confusion and impaired functional mobility Referring Physician: Roda Shutters     HPI: Tara Martin is a 83 y.o. 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  Code stroke was initiated and CT of the head demonstrated a left occipital lobe infarct.  CTA of the head and neck revealed severe left PCA stenosis, occluded at the left P3.  She also has an attenuated stenotic cervical left ICA and left siphon on.  There is evidence of collaterals supplied to the left ICA terminus from the posterior communicating artery and posterior circulation.  MRI of the brain noted multifocal acute ischemia involving the left occipital lobe, left temporal lobe, left thalamus and within the bilateral parietal subcortical white matter.  Patient now on IV heparin pending biopsy of her right middle lung lobe mass.  Patient was seen by therapies yesterday and was min assist for sit to stand transfers, mod assist for 12 feet of gait using rolling walker.  Therapy notes poor placement of right lower extremity with ataxia.  Prior to arrival patient was independent and driving.  She lives with her spouse in a two-level home with 3 steps to enter.  She can stay on the first floor if needed.     Review of Systems  Constitutional:  Negative for fever.  HENT:  Negative for hearing loss.   Eyes:  Negative  for blurred vision.  Respiratory:  Positive for cough.   Cardiovascular:  Negative for chest pain.  Gastrointestinal:  Negative for heartburn.  Genitourinary: Negative.   Musculoskeletal:  Positive for myalgias.  Skin:  Negative for rash.  Neurological:  Positive for focal weakness and weakness.  Psychiatric/Behavioral:  Positive for memory loss.         Past Medical History:  Diagnosis Date   Anxiety state, unspecified - sees Dr. Jennette Kettle in gyn and benzo prescribed there 04/09/2013   Arthritis     Chicken pox     Hx of blood clots      jan 2025   Lupus     MCI (mild cognitive impairment) 05/20/2020   Migraines     Prediabetes               Past Surgical History:  Procedure Laterality Date   BACK SURGERY   2000   CHOLECYSTECTOMY       DILATION AND CURETTAGE OF UTERUS   1973   GALLBLADDER SURGERY   1974             Family History  Problem Relation Age of Onset   Arthritis Mother     Diabetes Mother     Heart disease Mother     Heart attack Mother     Pneumonia Father     COPD Sister     Alzheimer's disease Sister     Heart disease Brother     Pulmonary embolism Brother  Alzheimer's disease Brother     Migraines Daughter     Alcoholism Other     Arthritis Other     Hypertension Other     Sudden death Other     Colon cancer Neg Hx     Esophageal cancer Neg Hx     Stomach cancer Neg Hx     Colon polyps Neg Hx          Social History:  reports that she has never smoked. She has never used smokeless tobacco. She reports that she does not currently use alcohol after a past usage of about 2.0 standard drinks of alcohol per week. She reports that she does not use drugs. Allergies:  Allergies       Allergies  Allergen Reactions   Codeine Other (See Comments)      Per patient, this made her "flip out"            Medications Prior to Admission  Medication Sig Dispense Refill   ALPRAZolam (XANAX) 0.5 MG tablet Take 0.5 mg by mouth daily as needed for  anxiety.       apixaban (ELIQUIS) 5 MG TABS tablet Take 1 tablet (5 mg total) by mouth 2 (two) times daily. 60 tablet 5   diclofenac sodium (VOLTAREN) 1 % GEL Apply 3 gm to 3 large joints up to 3 times a day.Dispense 3 tubes with 3 refills. (Patient taking differently: Apply 3 g topically 3 (three) times daily as needed (for pain- large joints).) 3 Tube 1   donepezil (ARICEPT) 10 MG tablet Take 1 tablet (10 mg total) by mouth at bedtime. 90 tablet 1   Multiple Vitamins-Minerals (MULTIVITAMIN WOMEN 50+) TABS Take 1 tablet by mouth daily with breakfast.       REFRESH OPTIVE ADVANCED PF 0.5-1-0.5 % SOLN Place 1 drop into both eyes 3 (three) times daily as needed (for irritation).              Home: Home Living Family/patient expects to be discharged to:: Private residence Living Arrangements: Spouse/significant other Available Help at Discharge: Family, Friend(s), Available PRN/intermittently Type of Home: House Home Access: Stairs to enter [employer] of Steps: 3 on the side and 5 in the front one rail on side with brick wall and bil rails in front Entrance Stairs-Rails: Left Home Layout: Two level, Able to live on main level with bedroom/bathroom Bathroom Shower/Tub: Engineer, manufacturing systems: Handicapped height Home Equipment: BSC/3in1, Shower seat Additional Comments: lives with spouse who is visually impaired. Daughter reports she can stay as long as needed  Lives With: Spouse  Functional History: Prior Function Prior Level of Function : Independent/Modified Independent, Driving Mobility Comments: Pt reports no use of AD prior to hospitalization. ADLs Comments: ind Functional Status:  Mobility: Bed Mobility Overal bed mobility: Needs Assistance Bed Mobility: Supine to Sit, Sit to Supine Supine to sit: Min assist, HOB elevated Sit to supine: Min assist, HOB elevated General bed mobility comments: Min assist for trunk support and to guide RUE with turning to EOB.  LE support for sequencing back into bed with verbal cues for technique. Transfers Overall transfer level: Needs assistance Equipment used: Rolling walker (2 wheels) Transfers: Sit to/from Stand Sit to Stand: Min assist Bed to/from chair/wheelchair/BSC transfer type:: Step pivot Step pivot transfers: Min assist, Mod assist General transfer comment: Min assist for boost and balance. Multimodal cues for set-up and sequencing prior to rising, RUE assisted to find RW. Tactile cues to turn head and visually  locate RUE and hand grip on RW. Ambulation/Gait Ambulation/Gait assistance: Mod assist Gait Distance (Feet): 12 Feet Assistive device: Rolling walker (2 wheels) Gait Pattern/deviations: Step-through pattern, Step-to pattern, Decreased stride length, Decreased dorsiflexion - right, Shuffle, Ataxic, Drifts right/left, Wide base of support General Gait Details: Educated on safe AD use with RW for support, with effort able to sustain grip with RUE on RW. Poor awareness of RLE placement, ataxic, needing assist with RW control and body placement with BOS of RW. No overt buckling. Mod assist for RW control and balance. Multimodal cues for stepping backwards and sequencing to align with bed. Gait velocity: dec Gait velocity interpretation: <1.31 ft/sec, indicative of household ambulator   ADL: ADL Overall ADL's : Needs assistance/impaired Grooming: Maximal assistance, Cueing for sequencing, Sitting Upper Body Bathing: Maximal assistance, Cueing for sequencing, Sitting Lower Body Bathing: Maximal assistance, Cueing for sequencing, Sitting/lateral leans Upper Body Dressing : Maximal assistance, Cueing for sequencing, Sitting Lower Body Dressing: Maximal assistance, Cueing for sequencing, Sitting/lateral leans Toilet Transfer: Moderate assistance, Cueing for safety, Cueing for sequencing, BSC/3in1 Toileting- Clothing Manipulation and Hygiene: Maximal assistance, Cueing for safety, Cueing for sequencing,  Sitting/lateral lean, Sit to/from stand General ADL Comments: Pt max A for dressing/bathing and feeding due to poor coordination and R side neglect, difficulty sequencing, not aware of deficits, poor balance with transfers.   Cognition: Cognition Overall Cognitive Status: Impaired/Different from baseline Arousal/Alertness: Awake/alert Orientation Level: Oriented to person, Disoriented to situation, Disoriented to time, Disoriented to place Attention: Sustained Sustained Attention: Impaired Sustained Attention Impairment: Verbal basic, Functional basic Memory: Impaired Memory Impairment: Other (comment) (impacted by aphasia) Awareness: Impaired Cognition Arousal: Alert Behavior During Therapy: WFL for tasks assessed/performed Overall Cognitive Status: Impaired/Different from baseline   Blood pressure 136/62, pulse 68, temperature 97.6 F (36.4 C), temperature source Oral, resp. rate (!) 22, height 5\' 5"  (1.651 m), SpO2 95%. Physical Exam Constitutional:      General: She is not in acute distress. HENT:     Head: Normocephalic.     Right Ear: External ear normal.     Left Ear: External ear normal.     Nose: Nose normal.  Eyes:     Extraocular Movements: Extraocular movements intact.     Pupils: Pupils are equal, round, and reactive to light.  Cardiovascular:     Rate and Rhythm: Normal rate.  Pulmonary:     Effort: Pulmonary effort is normal.  Abdominal:     Palpations: Abdomen is soft.  Musculoskeletal:        General: Normal range of motion.     Cervical back: Normal range of motion.  Skin:    General: Skin is warm.  Neurological:     Mental Status: She is alert.     Comments: Pt is alert, oriented to name, hospital, month/year. Does demonstrate limitations in awareness and insight as well as short term memory had difficulties with left/right discrimination and attempted to perform all activities using LUE during exam today despite repeated verbal and tactile cueing.  Right inattention. CN exam was non-focal. MMT: LUE 4+/5. RUE 4/5. LLE 4+/5. RLE 4+/5. Was better with discriminating lower extremities. Sensed pain and light touch in all 4's. No abnl resting tone. Right upper and lower limb ataxia noted.   Psychiatric:        Mood and Affect: Mood normal.        Behavior: Behavior normal.        Lab Results Last 24 Hours  Results for orders placed or performed during the hospital encounter of 06/08/23 (from the past 24 hours)  Heparin level (unfractionated)     Status: Abnormal    Collection Time: 06/09/23  2:07 PM  Result Value Ref Range    Heparin Unfractionated >1.10 (H) 0.30 - 0.70 IU/mL  Prepare RBC (crossmatch)     Status: None    Collection Time: 06/09/23  5:00 PM  Result Value Ref Range    Order Confirmation          ORDER PROCESSED BY BLOOD BANK Performed at [facility], [address], [place], [place] [address]    APTT     Status: Abnormal    Collection Time: 06/09/23  9:50 PM  Result Value Ref Range    aPTT 37 (H) 24 - 36 seconds  Heparin level (unfractionated)     Status: None    Collection Time: 06/09/23  9:50 PM  Result Value Ref Range    Heparin Unfractionated 0.60 0.30 - 0.70 IU/mL  CBC     Status: Abnormal    Collection Time: 06/10/23  4:10 AM  Result Value Ref Range    WBC 4.4 4.0 - 10.5 K/uL    RBC 3.56 (L) 3.87 - 5.11 MIL/uL    Hemoglobin 9.3 (L) 12.0 - 15.0 g/dL    HCT 52.8 (L) 41.3 - 46.0 %    MCV 80.3 80.0 - 100.0 fL    MCH 26.1 26.0 - 34.0 pg    MCHC 32.5 30.0 - 36.0 g/dL    RDW 24.4 (H) 01.0 - 15.5 %    Platelets 220 150 - 400 K/uL    nRBC 0.0 0.0 - 0.2 %  Basic metabolic panel with GFR     Status: Abnormal    Collection Time: 06/10/23  4:10 AM  Result Value Ref Range    Sodium 137 135 - 145 mmol/L    Potassium 3.9 3.5 - 5.1 mmol/L    Chloride 107 98 - 111 mmol/L    CO2 19 (L) 22 - 32 mmol/L    Glucose, Bld 101 (H) 70 - 99 mg/dL    BUN 10 8 - 23 mg/dL    Creatinine, Ser 2.72 0.44 - 1.00 mg/dL     Calcium 8.8 (L) 8.9 - 10.3 mg/dL    GFR, Estimated >53 >66 mL/min    Anion gap 11 5 - 15  Heparin level (unfractionated)     Status: None    Collection Time: 06/10/23  9:06 AM  Result Value Ref Range    Heparin Unfractionated 0.49 0.30 - 0.70 IU/mL  APTT     Status: Abnormal    Collection Time: 06/10/23  9:06 AM  Result Value Ref Range    aPTT 49 (H) 24 - 36 seconds       Imaging Results (Last 48 hours)  CT CHEST WO CONTRAST Result Date: 06/09/2023 CLINICAL DATA:  Pleural mass EXAM: CT CHEST WITHOUT CONTRAST TECHNIQUE: Multidetector CT imaging of the chest was performed following the standard protocol without IV contrast. RADIATION DOSE REDUCTION: This exam was performed according to the departmental dose-optimization program which includes automated exposure control, adjustment of the mA and/or kV according to patient size and/or use of iterative reconstruction technique. COMPARISON:  03/09/2023 FINDINGS: Cardiovascular: Heart is normal size. Aorta is normal caliber. Small pericardial effusion. Scattered coronary artery and aortic calcifications. Mediastinum/Nodes: Mediastinal adenopathy is stable since prior study. Difficult to assess the hila right without IV contrast, but right hilar  fullness is suspicious for adenopathy. No axillary adenopathy. Trachea and esophagus are unremarkable. Thyroid unremarkable. Lungs/Pleura: Medial right middle lobe mass again noted measuring 4.0 x 2.2 cm compared to 2.6 x 1.3 cm on prior study when measured at the same level and in the same planes. This is highly suspicious for primary lung cancer. Anterior right middle lobe nodule measures 8 mm compared to 7 mm previously. Dependent atelectasis or scarring. No effusions. Upper Abdomen: No acute findings Musculoskeletal: Chest wall soft tissues are unremarkable. No acute bony abnormality. IMPRESSION: Medial right middle lobe mass has enlarged since prior study, now measuring approximately 4.0 x 2.2 cm compared to  2.6 x 1.3 cm previously. This is most compatible with primary lung cancer. This could be further evaluated with PET CT. Stable mediastinal adenopathy. Difficult to assess the hila without IV contrast, but right hilar fullness is suspicious for adenopathy. Bibasilar atelectasis or scarring. Small pericardial effusion. Aortic Atherosclerosis (ICD10-I70.0). Electronically Signed   By: Charlett Nose M.D.   On: 06/09/2023 19:34    ECHOCARDIOGRAM COMPLETE BUBBLE STUDY Result Date: 06/09/2023    ECHOCARDIOGRAM REPORT   Patient Name:   MIKALA PODOLL Cancro Date of Exam: 06/09/2023 Medical Rec #:  [id number]        Height:       65.0 in Accession #:    [id number]       Weight:       135.0 lb Date of Birth:  April 01, 1940        BSA:          1.674 m Patient Age:    82 years         BP:           158/83 mmHg Patient Gender: F                HR:           63 bpm. Exam Location:  Inpatient Procedure: 2D Echo, Color Doppler, Cardiac Doppler and Saline Contrast Bubble            Study (Both Spectral and Color Flow Doppler were utilized during            procedure). Indications:    Stroke 434.91 / I63.9  History:        Patient has prior history of Echocardiogram examinations, most                 recent 03/10/2023.  Sonographer:    Harriette Bouillon RDCS Referring Phys: [phone] DAVID MANUEL ORTIZ IMPRESSIONS  1. Left ventricular ejection fraction, by estimation, is 60 to 65%. Left ventricular ejection fraction by PLAX is 64 %. The left ventricle has normal function. The left ventricle has no regional wall motion abnormalities. Left ventricular diastolic parameters are consistent with Grade I diastolic dysfunction (impaired relaxation).  2. Right ventricular systolic function is normal. The right ventricular size is normal.  3. There is no evidence of cardiac tamponade.  4. The mitral valve is abnormal. Mild mitral valve regurgitation.  5. The aortic valve is tricuspid. Aortic valve regurgitation is not visualized.  6. The inferior vena cava is  normal in size with greater than 50% respiratory variability, suggesting right atrial pressure of 3 mmHg.  7. Doppler suspicious of left to right atrial level shunting by color flow Doppler. However, agitated saline contrast bubble study was negative, with no evidence of right to left interatrial shunt. There is a likely a patent foramen ovale with predominantly left to right  shunting across the atrial septum. Comparison(s): Changes from prior study are noted. 03/10/2023: LVEF 60-65%. Conclusion(s)/Recommendation(s): Findings suggest possible PFO with left to right shunting, however, no right to left shunting seen by saline microbubble contrast - even with valsalva. An area of bubble "Washout" was noted at the atrial septum, which could be due to brisk left to right flow. Consider further evaluation with transesophageal echocardiogram and bubble study which would provide more definitive images. FINDINGS  Left Ventricle: Left ventricular ejection fraction, by estimation, is 60 to 65%. Left ventricular ejection fraction by PLAX is 64 %. The left ventricle has normal function. The left ventricle has no regional wall motion abnormalities. The left ventricular internal cavity size was normal in size. There is no left ventricular hypertrophy. Left ventricular diastolic parameters are consistent with Grade I diastolic dysfunction (impaired relaxation). Indeterminate filling pressures. Right Ventricle: The right ventricular size is normal. No increase in right ventricular wall thickness. Right ventricular systolic function is normal. Left Atrium: Left atrial size was normal in size. Right Atrium: Right atrial size was normal in size. Prominent Chiari network. Pericardium: Trivial pericardial effusion is present. The pericardial effusion is circumferential. There is no evidence of cardiac tamponade. Mitral Valve: The mitral valve is abnormal. Mild mitral valve regurgitation. Tricuspid Valve: The tricuspid valve is grossly  normal. Tricuspid valve regurgitation is trivial. Aortic Valve: The aortic valve is tricuspid. Aortic valve regurgitation is not visualized. Pulmonic Valve: The pulmonic valve was normal in structure. Pulmonic valve regurgitation is not visualized. Aorta: The aortic root and ascending aorta are structurally normal, with no evidence of dilitation. Venous: The inferior vena cava is normal in size with greater than 50% respiratory variability, suggesting right atrial pressure of 3 mmHg. IAS/Shunts: The interatrial septum is aneurysmal. Evidence of atrial level shunting detected by color flow Doppler. Agitated saline contrast was given intravenously to evaluate for intracardiac shunting. Agitated saline contrast bubble study was negative, with no evidence of any interatrial shunt. A moderately sized patent foramen ovale is detected with predominantly left to right shunting across the atrial septum.  LEFT VENTRICLE PLAX 2D LV EF:         Left            Diastology                ventricular     LV e' medial:    5.98 cm/s                ejection        LV E/e' medial:  11.5                fraction by     LV e' lateral:   9.03 cm/s                PLAX is 64      LV E/e' lateral: 7.6                %. LVIDd:         4.10 cm LVIDs:         2.70 cm LV PW:         1.00 cm LV IVS:        1.00 cm LVOT diam:     2.00 cm LV SV:         65 LV SV Index:   39 LVOT Area:     3.14 cm  RIGHT VENTRICLE  IVC RV S prime:     19.90 cm/s  IVC diam: 1.40 cm TAPSE (M-mode): 1.7 cm LEFT ATRIUM           Index        RIGHT ATRIUM           Index LA diam:      3.60 cm 2.15 cm/m   RA Area:     10.10 cm LA Vol (A2C): 38.0 ml 22.70 ml/m  RA Volume:   19.80 ml  11.83 ml/m LA Vol (A4C): 34.8 ml 20.79 ml/m  AORTIC VALVE LVOT Vmax:   86.20 cm/s LVOT Vmean:  59.300 cm/s LVOT VTI:    0.207 m  AORTA Ao Root diam: 3.10 cm Ao Asc diam:  3.20 cm MITRAL VALVE MV Area (PHT): 2.35 cm    SHUNTS MV Decel Time: 323 msec    Systemic VTI:  0.21 m MV  E velocity: 68.70 cm/s  Systemic Diam: 2.00 cm MV A velocity: 87.50 cm/s MV E/A ratio:  0.79 Tara Shutter MD Electronically signed by Tara Shutter MD Signature Date/Time: 06/09/2023/4:47:04 PM    Final     VAS US CAROTID Result Date: 06/09/2023 Carotid Arterial Duplex Study Patient Name:  KIANNA BILLET Pellman  Date of Exam:   06/09/2023 Medical Rec #: [id number]         Accession #:    [id number] Date of Birth: 20-May-1940         Patient Gender: F Patient Age:   66 years Exam Location:  [facility] Procedure:      VAS US CAROTID Referring Phys: DAVID ORTIZ --------------------------------------------------------------------------------  Indications:       CVA, Speech disturbance, Numbness and Weakness. Risk Factors:      Hyperlipidemia, no history of smoking. Other Factors:     Late onset Alzheimer's, Lupus, right middle lobe lung mass,                    question malignancy. Comparison Study:  No prior study Performing Technologist: Sherren Kerns RVS  Examination Guidelines: A complete evaluation includes B-mode imaging, spectral Doppler, color Doppler, and power Doppler as needed of all accessible portions of each vessel. Bilateral testing is considered an integral part of a complete examination. Limited examinations for reoccurring indications may be performed as noted.  Right Carotid Findings: +----------+--------+--------+--------+------------------+--------+           PSV cm/sEDV cm/sStenosisPlaque DescriptionComments +----------+--------+--------+--------+------------------+--------+ CCA Prox  98      19                                         +----------+--------+--------+--------+------------------+--------+ CCA Distal78      16                                         +----------+--------+--------+--------+------------------+--------+ ICA Prox  75      27              heterogenous               +----------+--------+--------+--------+------------------+--------+ ICA Mid   82       27                                         +----------+--------+--------+--------+------------------+--------+  ICA Distal130     33                                tortuous +----------+--------+--------+--------+------------------+--------+ ECA       77      9                                          +----------+--------+--------+--------+------------------+--------+ +----------+--------+-------+--------+-------------------+           PSV cm/sEDV cmsDescribeArm Pressure (mmHG) +----------+--------+-------+--------+-------------------+ Subclavian101                                        +----------+--------+-------+--------+-------------------+ +---------+--------+--+--------+--+ VertebralPSV cm/s60EDV cm/s17 +---------+--------+--+--------+--+  Left Carotid Findings: +----------+--------+--------+--------+------------------+--------+           PSV cm/sEDV cm/sStenosisPlaque DescriptionComments +----------+--------+--------+--------+------------------+--------+ CCA Prox  84      16                                         +----------+--------+--------+--------+------------------+--------+ CCA Distal70      17                                         +----------+--------+--------+--------+------------------+--------+ ICA Prox  38      10              heterogenous               +----------+--------+--------+--------+------------------+--------+ ICA Mid   89      26                                         +----------+--------+--------+--------+------------------+--------+ ICA Distal79      25                                         +----------+--------+--------+--------+------------------+--------+ ECA       114     11                                         +----------+--------+--------+--------+------------------+--------+ +----------+--------+--------+--------+-------------------+           PSV cm/sEDV cm/sDescribeArm  Pressure (mmHG) +----------+--------+--------+--------+-------------------+ [id number]                                          +----------+--------+--------+--------+-------------------+ +---------+--------+--+--------+--+ VertebralPSV cm/s41EDV cm/s15 +---------+--------+--+--------+--+   Summary: Right Carotid: The extracranial vessels were near-normal with only minimal wall                thickening or plaque. Left Carotid: The extracranial vessels were near-normal with only minimal wall               thickening or plaque. Vertebrals:  Bilateral vertebral arteries demonstrate antegrade flow. Subclavians: Normal flow hemodynamics were seen in bilateral subclavian              arteries. *See table(s) above for measurements and observations.     Preliminary     CT ANGIO HEAD NECK W WO CM Result Date: 06/09/2023 CLINICAL DATA:  83 year old female neurologic deficit. Multifocal infarcts in the anterior and posterior circulation on MRI yesterday. EXAM: CT ANGIOGRAPHY HEAD AND NECK WITH AND WITHOUT CONTRAST TECHNIQUE: Multidetector CT imaging of the head and neck was performed using the standard protocol during bolus administration of intravenous contrast. Multiplanar CT image reconstructions and MIPs were obtained to evaluate the vascular anatomy. Carotid stenosis measurements (when applicable) are obtained utilizing NASCET criteria, using the distal internal carotid diameter as the denominator. RADIATION DOSE REDUCTION: This exam was performed according to the departmental dose-optimization program which includes automated exposure control, adjustment of the mA and/or kV according to patient size and/or use of iterative reconstruction technique. CONTRAST:  60mL OMNIPAQUE IOHEXOL 350 MG/ML SOLN COMPARISON:  Brain MRI and head CT yesterday. CTA chest 03/09/2023. FINDINGS: CT HEAD Brain: Hypodense cytotoxic edema most pronounced in the left PCA territory, stable there from the CT yesterday. Increased  visualization of cytotoxic edema in the left thalamus since yesterday (series 5, image 14). Other small areas of abnormal diffusion on MRI remain occult by CT. No acute intracranial hemorrhage identified. No acute intracranial hemorrhage identified. Stable ventricle size and configuration. Normal basilar cisterns. Calvarium and skull base: Intact, stable. Paranasal sinuses: Visualized paranasal sinuses and mastoids are stable and well aerated. Orbits: No acute orbit or scalp soft tissue finding. CTA NECK Skeleton: Cervical spine degeneration. No acute osseous abnormality identified. Upper chest: Paraseptal emphysema or scattered pneumatocele is. Abnormal bulky superior mediastinal lymphadenopathy, appears malignant (series 10, image 168) and progressed since January. Suspected primary right middle lobe lung mass at that time not included today. Other neck: Thoracic inlet metastatic lymphadenopathy, bilateral lower neck level 4 stations is progressed since January (series 10, image 141). No other metastatic disease identified in the neck. Aortic arch: 3 vessel arch.  No significant arch atherosclerosis. Right carotid system: Tortuosity. Patent with no significant plaque or stenosis through the right carotid bifurcation. 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 The saccular component is also roughly 8 mm. No associated stenosis. Left carotid system: Tortuous left CCA and patent left carotid bifurcation without stenosis. However, attenuated right ICA distal to the bulb, circumferentially narrow due to 2-3 mm diameter. This remains patent to the skull base, with a small area of fusiform aneurysmal dilatation measuring 5-6 mm just below the skull base series 17, image 30. Vertebral arteries: Proximal subclavian arteries, cervical vertebral arteries are patent with  tortuosity, no significant plaque or stenosis to the skull base. CTA HEAD Posterior circulation: Right vertebral artery is mildly dominant throughout. Patent distal vertebral arteries and vertebrobasilar junction with tortuosity but no significant plaque or stenosis. Patent PICA origins. Patent basilar artery without stenosis. Patent SCA and left PCA origins. Fetal type right PCA origin. Prominent left posterior communicating artery also, which in light of the below left ICA siphon findings is probably collateral supply to the left ICA terminus. Left PCA is severely irregular and attenuated beginning at the junction of the P1 and posterior communicating artery, series 15, image 20,  and P3 branches are poorly enhancing. Contralateral right PCA branches appear more normal, mildly irregular. Anterior circulation:  Both ICA siphons are patent. But the left petrous ICA is highly irregular and stenotic as seen on series 12, image 126, with probable soft plaque (series 14, image 152). Left ICA remains patent, appears more normal in the cavernous and supraclinoid segments. Contralateral right ICA siphon is ectatic throughout, patent without stenosis. Both posterior communicating artery origins are normal. Patent carotid termini, MCA and ACA origins. Left ACA A1 is dominant and mildly irregular. Anterior communicating artery is diminutive but patent. Bilateral ACA branches are patent with mild irregularity. Right MCA M1 segment and bifurcation are patent without stenosis. Left MCA M1 segment and bifurcation are patent without stenosis. Bilateral MCA branches are patent with mild irregularity. Venous sinuses: Patent. Anatomic variants: Dominant right vertebral artery, right ACA A1, fetal type right PCA origin. Review of the MIP images confirms the above findings IMPRESSION: 1. Progressed since January and bulky Metastatic Lymphadenopathy in the visible mediastinum, bilateral lower neck/thoracic inlet. 2. Positive for Left PCA  severe stenosis, ultimately occluded in the P3 segments, concordant with acute ischemia demonstrated by MRI. 3. Positive also for highly abnormal bilateral Internal Carotid Arteries: - Attenuated, Stenotic cervical Left ICA and Left siphon, including High-grade stenosis in the petrous segment. Evidence of collateral supply to the Left ICA terminus from the posterior communicating artery, posterior circulation. 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 4. No other hemodynamically significant arterial stenosis in the head or neck. 5. Expected CT appearance of ischemia demonstrated by MRI yesterday. No hemorrhagic transformation or intracranial mass effect. Electronically Signed   By: Odessa Fleming M.D.   On: 06/09/2023 09:21    MR BRAIN WO CONTRAST Result Date: 06/08/2023 CLINICAL DATA:  Acute neurologic deficit EXAM: MRI HEAD WITHOUT CONTRAST TECHNIQUE: Multiplanar, multiecho pulse sequences of the brain and surrounding structures were obtained without intravenous contrast. COMPARISON:  None Available. FINDINGS: Brain: Multifocal abnormal diffusion restriction, including the left occipital lobe, left temporal lobe, left thalamus and within the bilateral parietal subcortical white matter. Petechial hemorrhage within the posterior right hemisphere. There is multifocal hyperintense T2-weighted signal within the white matter. Generalized volume loss. The midline structures are normal. Vascular: Normal flow voids. Skull and upper cervical spine: Normal calvarium and skull base. Visualized upper cervical spine and soft tissues are normal. Sinuses/Orbits:No paranasal sinus fluid levels or advanced mucosal thickening. No mastoid or middle ear effusion. Normal orbits. IMPRESSION: 1. Multifocal acute ischemia, including the left occipital lobe, left temporal lobe, left thalamus and within the bilateral  parietal subcortical white matter. No hemorrhage or mass effect. 2. Petechial hemorrhage within the posterior right hemisphere. Electronically Signed   By: Deatra Robinson M.D.   On: 06/08/2023 20:06    CT Head Wo Contrast Addendum Date: 06/08/2023 ADDENDUM REPORT: 06/08/2023 16:43 ADDENDUM: These results were called by telephone at the time of interpretation on 06/08/2023 at 4:37 pm to provider Dr. Rodena Medin, who verbally acknowledged these results. Electronically Signed   By: Darliss Cheney M.D.   On: 06/08/2023 16:43    Result Date: 06/08/2023 CLINICAL DATA:  Mental status change EXAM: CT HEAD WITHOUT CONTRAST TECHNIQUE: Contiguous axial images were obtained from the base of the skull through the vertex without intravenous contrast. RADIATION DOSE REDUCTION: This exam was performed according to the departmental dose-optimization program which includes automated exposure control, adjustment of  the mA and/or kV according to patient size and/or use of iterative reconstruction technique. COMPARISON:  MRI brain 02/24/2021.  Head CT 02/02/2020. FINDINGS: Brain: Focal area of hypodensity with loss of gray-white matter distinction is seen in the left occipital lobe, new from prior. No significant mass effect or midline shift, but there is associated sulcal effacement. No acute intracranial hemorrhage or hydrocephalus. There is mild diffuse atrophy. There is a small old cortical infarct in the high right parietal region which is new from 2021. Vascular: No hyperdense vessel or unexpected calcification. Skull: Normal. Negative for fracture or focal lesion. Sinuses/Orbits: No acute finding. Other: None. IMPRESSION: 1. Acute to subacute infarct in the left occipital lobe, new from prior. No significant mass effect or midline shift. No acute hemorrhage. 2. Small old cortical infarct in the high right parietal region, new from 2021. Electronically Signed: By: Darliss Cheney M.D. On: 06/08/2023 16:32    CT Cervical Spine Wo  Contrast Result Date: 06/08/2023 CLINICAL DATA:  Trauma EXAM: CT CERVICAL SPINE WITHOUT CONTRAST TECHNIQUE: Multidetector CT imaging of the cervical spine was performed without intravenous contrast. Multiplanar CT image reconstructions were also generated. RADIATION DOSE REDUCTION: This exam was performed according to the departmental dose-optimization program which includes automated exposure control, adjustment of the mA and/or kV according to patient size and/or use of iterative reconstruction technique. COMPARISON:  Cervical spine CT 02/02/2020 FINDINGS: Alignment: There is 2 mm of anterolisthesis at C7-T1 which is favored is degenerative and unchanged. Alignment is otherwise anatomic. Skull base and vertebrae: No acute fracture. No primary bone lesion or focal pathologic process. Soft tissues and spinal canal: No prevertebral fluid or swelling. No visible canal hematoma. Disc levels: There is moderate severe disc space narrowing and endplate osteophyte formation at C4-C5, C5-C6 and C6-C7 compatible with degenerative change. There is diffuse bilateral facet arthropathy causing mild neural foraminal stenosis most significant at C3-C4, C4-C5 and C5-C6 on right. Posterior disc osteophyte complexes are seen at C4-C5, C5-C6 and C6-C7 causing mild central canal stenosis. Upper chest: Negative. Other: None. IMPRESSION: 1. No acute fracture or traumatic subluxation of the cervical spine. 2. Multilevel degenerative changes of the cervical spine. Electronically Signed   By: Darliss Cheney M.D.   On: 06/08/2023 16:36       Assessment/Plan: Diagnosis: 82 year old female status post multifocal infarcts concerning for cardioembolic source Does the need for close, 24 hr/day medical supervision in concert with the patient's rehab needs make it unreasonable for this patient to be served in a less intensive setting? Yes Co-Morbidities requiring supervision/potential complications:  -Right middle lobe lung mass pending  biopsy -Hypertension -Dementia -Hx of PE, DVT Due to bladder management, bowel management, safety, skin/wound care, disease management, medication administration, pain management, and patient education, does the patient require 24 hr/day rehab nursing? Yes Does the patient require coordinated care of a physician, rehab nurse, therapy disciplines of PT, OT, SLP to address physical and functional deficits in the context of the above medical diagnosis(es)? Yes Addressing deficits in the following areas: balance, endurance, locomotion, strength, transferring, bowel/bladder control, bathing, dressing, feeding, grooming, toileting, cognition, and psychosocial support Can the patient actively participate in an intensive therapy program of at least 3 hrs of therapy per day at least 5 days per week? Yes The potential for patient to make measurable gains while on inpatient rehab is good Anticipated functional outcomes upon discharge from inpatient rehab are supervision  with PT, supervision with OT, supervision with SLP. Estimated rehab length of stay to reach  the above functional goals is: 12-17 days Anticipated discharge destination: Home Overall Rehab/Functional Prognosis: excellent   POST ACUTE RECOMMENDATIONS: This patient's condition is appropriate for continued rehabilitative care in the following setting: CIR Patient has agreed to participate in recommended program. Yes Note that insurance prior authorization may be required for reimbursement for recommended care.   Comment: Husband is legally blind but can provide supervision and some hands on assist. Home is accessible. Rehab Admissions Coordinator to follow up.           I have personally performed a face to face diagnostic evaluation of this patient. Additionally, I have examined the patient's medical record including any pertinent labs and radiographic images.     Thanks,   Tara Oyster, MD 06/10/2023      Revision  History  Routing History

## 2023-06-14 NOTE — Transfer of Care (Signed)
 Immediate Anesthesia Transfer of Care Note  Patient: Tara Martin  Procedure(s) Performed: BRONCHOSCOPY, WITH EBUS (Bilateral) BRONCHOSCOPY, WITH BRUSH BIOPSY BRONCHOSCOPY, WITH NEEDLE ASPIRATION BIOPSY  Patient Location: PACU  Anesthesia Type:General  Level of Consciousness: awake and alert   Airway & Oxygen Therapy: Patient Spontanous Breathing  Post-op Assessment: Report given to RN and Post -op Vital signs reviewed and stable  Post vital signs: Reviewed and stable  Last Vitals:  Vitals Value Taken Time  BP 113/61 06/14/23 1534  Temp    Pulse 66 06/14/23 1534  Resp 21 06/14/23 1534  SpO2 93 % 06/14/23 1534    Last Pain:  Vitals:   06/14/23 1411  TempSrc: Temporal  PainSc: 0-No pain         Complications: No notable events documented.

## 2023-06-14 NOTE — Anesthesia Preprocedure Evaluation (Signed)
 Anesthesia Evaluation  Patient identified by MRN, date of birth, ID band Patient awake    Reviewed: Allergy & Precautions, NPO status , Patient's Chart, lab work & pertinent test results  Airway Mallampati: II  TM Distance: >3 FB Neck ROM: Full    Dental  (+) Dental Advisory Given   Pulmonary  Lung mass. PE   breath sounds clear to auscultation       Cardiovascular negative cardio ROS  Rhythm:Regular Rate:Normal     Neuro/Psych  Headaches  Neuromuscular disease CVA    GI/Hepatic negative GI ROS, Neg liver ROS,,,  Endo/Other  negative endocrine ROS    Renal/GU negative Renal ROS     Musculoskeletal  (+) Arthritis ,    Abdominal   Peds  Hematology  (+) Blood dyscrasia (PE on eliquis with lovenox bridge), anemia   Anesthesia Other Findings   Reproductive/Obstetrics                             Anesthesia Physical Anesthesia Plan  ASA: 3  Anesthesia Plan: General   Post-op Pain Management: Minimal or no pain anticipated   Induction: Intravenous  PONV Risk Score and Plan: 3 and Dexamethasone, Ondansetron and Treatment may vary due to age or medical condition  Airway Management Planned: Oral ETT  Additional Equipment: None  Intra-op Plan:   Post-operative Plan: Extubation in OR  Informed Consent: I have reviewed the patients History and Physical, chart, labs and discussed the procedure including the risks, benefits and alternatives for the proposed anesthesia with the patient or authorized representative who has indicated his/her understanding and acceptance.     Dental advisory given  Plan Discussed with: CRNA  Anesthesia Plan Comments:        Anesthesia Quick Evaluation

## 2023-06-14 NOTE — H&P (Signed)
 Physical Medicine and Rehabilitation Admission H&P    Chief Complaint  Patient presents with   Functional deficits due to stroke and advancement of dementia.     HPI: Tara Martin is an 83 year old RH female with history of anxiety d/o, migraines, lupus, Dx of RML lung mass, DVT and bilateral PE's 03/2023-on eliquis, late onset Alzheimer's with advancement (since Jan) who presented to ED on 06/08/23 with acute onset of MS changes, difficulty speaking and weakness. 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   MRI brain done revealing multifocal acute ischemia left occipital lobe, left temporal lobe, left thalamus and within bilateral parietal subcortical white matter and petechial hemorrhage within posterior right hemisphere. CT cervical spine with bilateral facet arthropathy with mild foraminal stenosis C3-C6 and mild central canal stenosis C4-C7.  Eliquis placed on hold, ASA added and Pulmonary consulted for work up of spiculated Lung mass. Dr. Roda Shutters felt that stoke concerning for cardioembolic source v/s hypercarbic state from advanced malignancy. IV heparin added per stroke protocol pending biopsy and to transition to therapeutic dose lovenox after a day.  She underwent bronchoscopy today with reportedly positive RML airway and 4 nodes on contralateral side.  Final path pending. She continues to be limited by significant visual deficits w/ right neglect, fluent aphasia with poor awareness of deficits, right sided weakness and requires min assist with ADL/mobility. She was fully independent prior to Jan admission and CIR recommended due to functional decline.   Pt reports she doesn't know what she's going for ( a biopsy) and I repeated  myself and explained what it was 3 different times- she was unable to remember by the time I left the room in 15 minutes.    Review of Systems  Unable to perform ROS: Mental acuity  Cardiovascular:  Negative for chest pain.  Gastrointestinal:        Incontinence of bowel.   Neurological:  Negative for dizziness and headaches.  All other systems reviewed and are negative.   Past Medical History:  Diagnosis Date   Anxiety state, unspecified - sees Dr. Jennette Kettle in gyn and benzo prescribed there 04/09/2013   Arthritis    Chicken pox    Hx of blood clots    jan 2025   Lupus    MCI (mild cognitive impairment) 05/20/2020   Migraines    Prediabetes     Past Surgical History:  Procedure Laterality Date   BACK SURGERY  2000   CHOLECYSTECTOMY     DILATION AND CURETTAGE OF UTERUS  1973   GALLBLADDER SURGERY  1974    Family History  Problem Relation Age of Onset   Arthritis Mother    Diabetes Mother    Heart disease Mother    Heart attack Mother    Pneumonia Father    COPD Sister    Alzheimer's disease Sister    Heart disease Brother    Pulmonary embolism Brother    Alzheimer's disease Brother    Migraines Daughter    Alcoholism Other    Arthritis Other    Hypertension Other    Sudden death Other    Colon cancer Neg Hx    Esophageal cancer Neg Hx    Stomach cancer Neg Hx    Colon polyps Neg Hx  Social History:  Married. Retired Psychologist, occupational. Husband is visually impaired and daughter has been helping them out since Jan? She reports that she has never smoked. She has never used smokeless tobacco. She reports that she does not currently use alcohol after a past usage of about 2.0 standard drinks of alcohol per week. She reports that she does not use drugs.   Allergies  Allergen Reactions   Codeine Other (See Comments)    Per patient, this made her "flip out"    Medications Prior to Admission  Medication Sig Dispense Refill   ALPRAZolam (XANAX) 0.5 MG tablet Take 0.5 mg by  mouth daily as needed for anxiety.     apixaban (ELIQUIS) 5 MG TABS tablet Take 1 tablet (5 mg total) by mouth 2 (two) times daily. 60 tablet 5   diclofenac sodium (VOLTAREN) 1 % GEL Apply 3 gm to 3 large joints up to 3 times a day.Dispense 3 tubes with 3 refills. (Patient taking differently: Apply 3 g topically 3 (three) times daily as needed (for pain- large joints).) 3 Tube 1   donepezil (ARICEPT) 10 MG tablet Take 1 tablet (10 mg total) by mouth at bedtime. 90 tablet 1   Multiple Vitamins-Minerals (MULTIVITAMIN WOMEN 50+) TABS Take 1 tablet by mouth daily with breakfast.     REFRESH OPTIVE ADVANCED PF 0.5-1-0.5 % SOLN Place 1 drop into both eyes 3 (three) times daily as needed (for irritation).       Home: Home Living Family/patient expects to be discharged to:: Private residence Living Arrangements: Spouse/significant other Available Help at Discharge: Family, Available 24 hours/day Type of Home: House Home Access: Stairs to enter [employer] of Steps: 3 on the side and 5 in the front one rail on side with brick wall and bil rails in front Entrance Stairs-Rails: Left Home Layout: Two level, Able to live on main level with bedroom/bathroom Bathroom Shower/Tub: Engineer, manufacturing systems: Handicapped height Bathroom Accessibility: Yes Home Equipment: BSC/3in1, Shower seat Additional Comments: lives with spouse who is visually impaired. Daughter reports she can stay as long as needed  Lives With: Spouse   Functional History: Prior Function Prior Level of Function : Independent/Modified Independent, Driving Mobility Comments: Pt reports no use of AD prior to hospitalization. ADLs Comments: ind  Functional Status:  Mobility: Bed Mobility Overal bed mobility: Needs Assistance Bed Mobility: Supine to Sit Supine to sit: HOB elevated, Contact guard Sit to supine: Min assist, HOB elevated General bed mobility comments: pt in chair on arrival (Simultaneous filing.  User may not have seen previous data.) Transfers Overall transfer level: Needs assistance (Simultaneous filing. User may not have seen previous data.) Equipment used: Rolling walker (2 wheels) (Simultaneous filing. User may not have seen previous data.) Transfers: Sit to/from Stand, Bed to chair/wheelchair/BSC (Simultaneous filing. User may not have seen previous data.) Sit to Stand: Min assist (Simultaneous filing. User may not have seen previous data.) Bed to/from chair/wheelchair/BSC transfer type:: Step pivot Step pivot transfers: Min assist General transfer comment: cues for hand placement and safety and to get hands positioned on walker correctly before walking. (Simultaneous filing. User may not have seen previous data.) Ambulation/Gait Ambulation/Gait assistance: Min assist Gait Distance (Feet): 100 Feet Assistive device: Rolling walker (2 wheels) Gait Pattern/deviations: Step-through pattern, Decreased stride length, Ataxic, Drifts right/left, Wide base of support General Gait Details: Intermittent min assist for RW control and to guide body into RW for proper alignment - drifts outside of walker BOS with turns. Needs intermittent cues for  awareness of objects/obstacles on Rt multimodal cues to avoid 50% of the time avoiding on her own with only verbal cues. No overt buckling. VSS. Gait velocity: dec Gait velocity interpretation: <1.31 ft/sec, indicative of household ambulator    ADL: ADL Overall ADL's : Needs assistance/impaired Eating/Feeding: Moderate assistance, Sitting, Cueing for sequencing Eating/Feeding Details (indicate cue type and reason): Pt using cup with increased independence. Vision limits abiltiy to feed self without help. Pt often cannot find food on plate despite cues and using clock to orient self to plate. Pt unsure of clock set up and thefore these cues are not effective. Pt apraxia and incoordiation in RUE making feeding self R handed difficult. Grooming:  Moderate assistance, Standing, Cueing for sequencing, Oral care Grooming Details (indicate cue type and reason): Pt stood at sink to groom. Unable to sequence or plan the task without max cues. Pt unable to find items on countertop even if they were on her L and harder time finding them on the R.  Once pt got started with brushing, pt brushed with min assist tomove toothbrush around in hand correctly due to apraxia. Upper Body Bathing: Moderate assistance, Standing Lower Body Bathing: Maximal assistance, Cueing for sequencing, Sitting/lateral leans Upper Body Dressing : Maximal assistance, Cueing for sequencing, Sitting Lower Body Dressing: Maximal assistance, Cueing for sequencing, Sitting/lateral leans Lower Body Dressing Details (indicate cue type and reason): Pt required step by step cues to donn socks in sitting. Physically required min assist. Toilet Transfer: Minimal assistance, Rolling walker (2 wheels) Toilet Transfer Details (indicate cue type and reason): simulated Toileting- Clothing Manipulation and Hygiene: Maximal assistance, Cueing for safety, Cueing for sequencing, Sitting/lateral lean, Sit to/from stand Functional mobility during ADLs: Moderate assistance, Rolling walker (2 wheels) General ADL Comments: Pt very limited due to poor vision, R inattention and motor planning and sequencing deficits.  Cognition: Cognition Overall Cognitive Status: Impaired/Different from baseline Arousal/Alertness: Awake/alert Orientation Level: Oriented to person Attention: Sustained Sustained Attention: Impaired Sustained Attention Impairment: Verbal basic, Functional basic Memory: Impaired Memory Impairment: Other (comment) (impacted by aphasia) Awareness: Impaired Cognition Arousal: Alert (Simultaneous filing. User may not have seen previous data.) Behavior During Therapy: [facility] for tasks assessed/performed (Simultaneous filing. User may not have seen previous data.) Overall Cognitive  Status: Impaired/Different from baseline   Blood pressure 114/66, pulse 60, temperature 98.2 F (36.8 C), temperature source Temporal, resp. rate 17, height 5\' 5"  (1.651 m), weight 58.3 kg, SpO2 92%. Physical Exam Vitals and nursing note reviewed.  Constitutional:      Appearance: Normal appearance.     Comments: Clearing throat frequently.  Awake, alert, sitting up in bed; deferring to her husband at bedside who's blind, NAD  HENT:     Head: Normocephalic and atraumatic.     Comments: Smile intact and facial sensation intact per pt- but not clear if true?    Right Ear: External ear normal.     Left Ear: External ear normal.     Nose: Nose normal. No congestion.     Mouth/Throat:     Mouth: Mucous membranes are dry.     Pharynx: Oropharynx is clear. No oropharyngeal exudate.  Eyes:     Comments: L gaze preference   Cardiovascular:     Rate and Rhythm: Normal rate and regular rhythm.     Heart sounds: Normal heart sounds. No murmur heard.    No gallop.  Pulmonary:     Effort: Pulmonary effort is normal. No respiratory distress.     Breath sounds:  Normal breath sounds. No wheezing.     Comments: Decreased on R base Abdominal:     General: Bowel sounds are normal. There is no distension.     Palpations: Abdomen is soft.     Tenderness: There is no abdominal tenderness.  Musculoskeletal:     Cervical back: Neck supple. No tenderness.     Comments: L side 5/5 in all muscles tested RUE 4+/5 throughout but perseverates so hard to test RLE 4/5 throughout- same as above  Skin:    General: Skin is warm and dry.     Comments: IV L forearm- covered so pt couldn't remove  Neurological:     Mental Status: She is alert.     Comments: Able to state name and DOB but unable to recall age, year '[birth year]/'[birth year], day -" May or June" or name of hospital.  Kept asking if she just had a procedure and results. Tended to repeat herself.  She was able to follow simple one step motor commands.  RUE  inattention/decreased motor control. Able to scan in all fields with cues to locate items in right lower field.  Pt thought was in hospital when given cues and in [place], but didn't know was [facility].  Thought was 2023 or 2024- May or June Knew was here for CVA, but didn't know was going for bronch/biopsy even when I rpeated them 3 different times separated by 3 minutes each Intact to light touch but R inattention   Psychiatric:     Comments: smiling     Results for orders placed or performed during the hospital encounter of 06/08/23 (from the past 48 hours)  CBC     Status: Abnormal   Collection Time: 06/13/23  4:18 AM  Result Value Ref Range   WBC 5.3 4.0 - 10.5 K/uL   RBC 3.65 (L) 3.87 - 5.11 MIL/uL   Hemoglobin 9.4 (L) 12.0 - 15.0 g/dL   HCT 40.9 (L) 81.1 - 91.4 %   MCV 82.7 80.0 - 100.0 fL   MCH 25.8 (L) 26.0 - 34.0 pg   MCHC 31.1 30.0 - 36.0 g/dL   RDW 78.2 (H) 95.6 - 21.3 %   Platelets 270 150 - 400 K/uL   nRBC 0.0 0.0 - 0.2 %    Comment: Performed at [facility], [address] [place], [place] [address]  Basic metabolic panel with GFR     Status: Abnormal   Collection Time: 06/13/23  4:18 AM  Result Value Ref Range   Sodium 137 135 - 145 mmol/L   Potassium 4.0 3.5 - 5.1 mmol/L   Chloride 106 98 - 111 mmol/L   CO2 21 (L) 22 - 32 mmol/L   Glucose, Bld 123 (H) 70 - 99 mg/dL    Comment: Glucose reference range applies only to samples taken after fasting for at least 8 hours.   BUN 16 8 - 23 mg/dL   Creatinine, Ser 8.46 0.44 - 1.00 mg/dL   Calcium 8.8 (L) 8.9 - 10.3 mg/dL   GFR, Estimated 57 (L) >60 mL/min    Comment: (NOTE) Calculated using the CKD-EPI Creatinine Equation (2021)    Anion gap 10 5 - 15    Comment: Performed at [facility], [address] [place], [place] [address]  CBC     Status: Abnormal   Collection Time: 06/14/23  4:31 AM  Result Value Ref Range   WBC 3.9 (L) 4.0 - 10.5 K/uL   RBC 3.64 (L) 3.87 -  5.11 MIL/uL   Hemoglobin 9.3  (L) 12.0 - 15.0 g/dL   HCT 29.5 (L) 62.1 - 30.8 %   MCV 81.3 80.0 - 100.0 fL   MCH 25.5 (L) 26.0 - 34.0 pg   MCHC 31.4 30.0 - 36.0 g/dL   RDW 65.7 (H) 84.6 - 96.2 %   Platelets 272 150 - 400 K/uL   nRBC 0.0 0.0 - 0.2 %    Comment: Performed at [facility], [address] [place], [place] [address]  Basic metabolic panel with GFR     Status: Abnormal   Collection Time: 06/14/23  4:31 AM  Result Value Ref Range   Sodium 136 135 - 145 mmol/L   Potassium 4.3 3.5 - 5.1 mmol/L   Chloride 102 98 - 111 mmol/L   CO2 23 22 - 32 mmol/L   Glucose, Bld 110 (H) 70 - 99 mg/dL    Comment: Glucose reference range applies only to samples taken after fasting for at least 8 hours.   BUN 16 8 - 23 mg/dL   Creatinine, Ser 1.32 0.44 - 1.00 mg/dL   Calcium 8.8 (L) 8.9 - 10.3 mg/dL   GFR, Estimated >44 >01 mL/min    Comment: (NOTE) Calculated using the CKD-EPI Creatinine Equation (2021)    Anion gap 11 5 - 15    Comment: Performed at [facility], [address] [place], [place] [address]   No results found.    Blood pressure 114/66, pulse 60, temperature 98.2 F (36.8 C), temperature source Temporal, resp. rate 17, height 5\' 5"  (1.651 m), weight 58.3 kg, SpO2 92%.  Medical Problem List and Plan: 1. 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   -patient may  shower-   -ELOS/Goals: 12-18 days supervision due to poor memory  Admit to CIR 2.  Antithrombotics: -DVT/anticoagulation:  Pharmaceutical: Lovenox  -antiplatelet therapy: ASA 3. Pain Management: Tylenol prn for pain.  4. Mood/Behavior/Sleep: LCSW to follow for evaluation and support.   -antipsychotic agents: N/A 5. Neuropsych/cognition: This patient is not fully capable of making decisions on of own behalf. 6. Skin/Wound Care: Routine pressure relief measures.  7. Fluids/Electrolytes/Nutrition: Monitor intake. Check CMET in am.  8. PE/DVT:  To start treatment dose of lovenox in am 9. Moderate degree dementia: just dx'd Advancement past PE/hypoxia per 05/2023 visit.  --Now back on Aricept.  10. Bronch/Biopsy of 4.4 x 2+ cm R middle lung nodule that's suspicious for cancer  11. HLD- con't statin    Jacquelynn Cree, PA-C 06/14/2023  I have personally performed a face to face diagnostic evaluation of this patient and formulated the key components of the plan.  Additionally, I have personally reviewed laboratory data, imaging studies, as well as relevant notes and concur with the physician assistant's documentation above.   The patient's status has not changed from the original H&P.  Any changes in documentation from the acute care chart have been noted above.

## 2023-06-15 ENCOUNTER — Encounter (HOSPITAL_COMMUNITY): Payer: Self-pay | Admitting: Emergency Medicine

## 2023-06-15 DIAGNOSIS — H53461 Homonymous bilateral field defects, right side: Secondary | ICD-10-CM

## 2023-06-15 DIAGNOSIS — F02B3 Dementia in other diseases classified elsewhere, moderate, with mood disturbance: Secondary | ICD-10-CM

## 2023-06-15 DIAGNOSIS — G301 Alzheimer's disease with late onset: Secondary | ICD-10-CM

## 2023-06-15 DIAGNOSIS — I6340 Cerebral infarction due to embolism of unspecified cerebral artery: Secondary | ICD-10-CM | POA: Diagnosis not present

## 2023-06-15 LAB — COMPREHENSIVE METABOLIC PANEL WITH GFR
ALT: 15 U/L (ref 0–44)
AST: 24 U/L (ref 15–41)
Albumin: 2.9 g/dL — ABNORMAL LOW (ref 3.5–5.0)
Alkaline Phosphatase: 61 U/L (ref 38–126)
Anion gap: 10 (ref 5–15)
BUN: 17 mg/dL (ref 8–23)
CO2: 20 mmol/L — ABNORMAL LOW (ref 22–32)
Calcium: 9.1 mg/dL (ref 8.9–10.3)
Chloride: 106 mmol/L (ref 98–111)
Creatinine, Ser: 0.9 mg/dL (ref 0.44–1.00)
GFR, Estimated: >60 mL/min (ref >60)
Glucose, Bld: 126 mg/dL — ABNORMAL HIGH (ref 70–99)
Potassium: 4.4 mmol/L (ref 3.5–5.1)
Sodium: 136 mmol/L (ref 135–145)
Total Bilirubin: 0.5 mg/dL (ref 0.0–1.2)
Total Protein: 6.5 g/dL (ref 6.5–8.1)

## 2023-06-15 LAB — CBC WITH DIFFERENTIAL/PLATELET
Abs Immature Granulocytes: 0.03 10*3/uL (ref 0.00–0.07)
Basophils Absolute: 0 10*3/uL (ref 0.0–0.1)
Basophils Relative: 0 %
Eosinophils Absolute: 0 10*3/uL (ref 0.0–0.5)
Eosinophils Relative: 0 %
HCT: 30.5 % — ABNORMAL LOW (ref 36.0–46.0)
Hemoglobin: 9.6 g/dL — ABNORMAL LOW (ref 12.0–15.0)
Immature Granulocytes: 1 %
Lymphocytes Relative: 14 %
Lymphs Abs: 0.7 10*3/uL (ref 0.7–4.0)
MCH: 25.7 pg — ABNORMAL LOW (ref 26.0–34.0)
MCHC: 31.5 g/dL (ref 30.0–36.0)
MCV: 81.8 fL (ref 80.0–100.0)
Monocytes Absolute: 0.3 10*3/uL (ref 0.1–1.0)
Monocytes Relative: 6 %
Neutro Abs: 4.2 10*3/uL (ref 1.7–7.7)
Neutrophils Relative %: 79 %
Platelets: 262 10*3/uL (ref 150–400)
RBC: 3.73 MIL/uL — ABNORMAL LOW (ref 3.87–5.11)
RDW: 16.2 % — ABNORMAL HIGH (ref 11.5–15.5)
WBC: 5.3 10*3/uL (ref 4.0–10.5)
nRBC: 0 % (ref 0.0–0.2)

## 2023-06-15 NOTE — Progress Notes (Signed)
 Inpatient Rehabilitation  Patient information reviewed and entered into eRehab system by [facility]. Karen Kays., CCC/SLP, PPS Coordinator.  Information including medical coding, functional ability and quality indicators will be reviewed and updated through discharge.

## 2023-06-15 NOTE — Progress Notes (Addendum)
 Inpatient Rehabilitation Admission Medication Review by a Pharmacist  A complete drug regimen review was completed for this patient to identify any potential clinically significant medication issues.  High Risk Drug Classes Is patient taking? Indication by Medication  Antipsychotic Yes, as an intravenous medication Prochloperazine - PRN nausea  Anticoagulant Yes Lovenox - PE treatment  Antibiotic No   Opioid No   Antiplatelet Yes Aspirin - CVA treatment/ppx  Hypoglycemics/insulin No   Vasoactive Medication No   Chemotherapy No   Other Yes fleet enema , bisacodyl , and - constipation Maalox- indigestion Pantoprazole- reflux  Diphenhydramine- itching  Acetaminophen- pain   melatonin and -insomnia Donepezil - dementia Alprazolam - anxiety Atorvastatin - CVA tx/ppx     Type of Medication Issue Identified Description of Issue Recommendation(s)  Drug Interaction(s) (clinically significant)     Duplicate Therapy     Allergy     No Medication Administration End Date     Incorrect Dose     Additional Drug Therapy Needed     Significant med changes from prior encounter (inform family/care partners about these prior to discharge). Apixaban discontinued during inpatient admission Communicate relevant medication changes to patient/family members at discharge from CIR.   Restart or discontinue PTA meds not resumed in CIR at discharge if clinically indicated.   Other       Clinically significant medication issues were identified that warrant physician communication and completion of prescribed/recommended actions by midnight of the next day:  No  Name of provider notified for urgent issues identified: N/a  Provider Method of Notification: N/a  Time spent performing this drug regimen review (minutes):  20 minutes   Dalene Carrow 06/15/2023 7:17 AM

## 2023-06-15 NOTE — Evaluation (Signed)
 Speech Language Pathology Assessment and Plan  Patient Details  Name: Tara Martin MRN: [id number] Date of Birth: 24-Aug-1940  SLP Diagnosis: Cognitive Impairments  Rehab Potential: Good ELOS: 10-14 days   Today's Date: 06/15/2023 SLP Individual Time: 7829-5621 SLP Individual Time Calculation (min): 56 min  Hospital Problem: Principal Problem:   Embolic stroke [facility])  Past Medical History:  Past Medical History:  Diagnosis Date   Anxiety state, unspecified - sees Dr. Jennette Kettle in gyn and benzo prescribed there 04/09/2013   Arthritis    Chicken pox    Hx of blood clots    jan 2025   Lupus    MCI (mild cognitive impairment) 05/20/2020   Migraines    Prediabetes    Past Surgical History:  Past Surgical History:  Procedure Laterality Date   BACK SURGERY  2000   CHOLECYSTECTOMY     DILATION AND CURETTAGE OF UTERUS  1973   GALLBLADDER SURGERY  1974    Assessment / Plan / Recommendation Clinical Impression  83 y.o female presenting to the emergency department via EMS with progressively worse mental status associated with right-sided weakness, confusion and unsteady gait.  MRI shows multifocal infarcts-acute to subacute involving the left PCA territory (left occipital lobe, left temporal lobe, left thalamus), bilateral parietal lobes and bilateral cerebellar infarcts. Recent appt with Dr Vickey Huger, [facility] on 05/25/23, dx with moderate dementia (MMSE 18/30). PMH includes anxiety, osteoarthritis, varicella-zoster, systemic lupus erythematosus, migraine headaches, prediabetes.   Cognitive-Linguistic Evaluation: Patient presents with severe deficits in the areas of: memory, sustained and selective attention, problem solving, executive function, and orientation as evident across functional activities, MMSE, and patient interview. Patient has baseline moderate dementia, however family present to corroborate baseline. Patient is poor historian but family does state that  despite baseline dementia, patient is additionally exhibiting acute changes to cognition. Patient is only oriented to self and perseverated during session on husband's wellness/wellbeing. Patient was difficult to redirect from this thought, but calmed down once husband appeared midway through session. Patient scored 10/30 on MMSE, decreased from 18/30 during neurology visit 05/24/23. Patient would benefit from SLP services targeting all aforementioned deficits. Patient was left in lowered bed with call bell in reach and bed alarm set. SLP will continue to target goals per plan of care.     Skilled Therapeutic Interventions          SLP conducted skilled assessment utilizing MMSE, patient family interview, and functional task assessment. Full results above.    SLP Assessment  Patient will need skilled Speech Lanaguage Pathology Services during CIR admission    Recommendations  Recommendations for Other Services: Neuropsych consult Patient destination: Home Follow up Recommendations: Home Health SLP;24 hour supervision/assistance Equipment Recommended: None recommended by SLP    SLP Frequency 3 to 5 out of 7 days   SLP Duration  SLP Intensity  SLP Treatment/Interventions 10-14 days  Minumum of 1-2 x/day, 30 to 90 minutes  Cognitive remediation/compensation;Environmental controls;Cueing hierarchy;Functional tasks;Therapeutic Activities;Internal/external aids    Pain Pain Assessment Pain Scale: 0-10 Pain Score: 0-No pain  Prior Functioning Cognitive/Linguistic Baseline: Baseline deficits Baseline deficit details: moderate Alzheimer's dementia per neurology visit 05/24/23 Type of Home: House  Lives With: Spouse Available Help at Discharge: Family;Available 24 hours/day Education: n/a Vocation: Retired  Architectural technologist Overall Cognitive Status: Impaired/Different from baseline Arousal/Alertness: Awake/alert Orientation Level: Oriented to person;Disoriented to  time;Disoriented to situation;Disoriented to place Year:  (1942) Month:  (none stated) Day of Week: Incorrect Attention: Sustained;Selective Sustained Attention: Impaired  Sustained Attention Impairment: Verbal basic;Functional basic Selective Attention: Impaired Selective Attention Impairment: Verbal basic;Functional basic Memory: Impaired Memory Impairment: Decreased recall of new information;Decreased short term memory;Storage deficit;Decreased long term memory;Retrieval deficit Decreased Long Term Memory: Verbal basic;Functional basic Decreased Short Term Memory: Verbal basic;Functional basic Awareness: Impaired Awareness Impairment: Intellectual impairment;Emergent impairment;Anticipatory impairment Problem Solving: Impaired Problem Solving Impairment: Verbal basic;Functional basic Executive Function: Reasoning;Organizing;Decision Making;Sequencing;Self Monitoring;Self Correcting Reasoning: Impaired Reasoning Impairment: Verbal basic Sequencing: Impaired Sequencing Impairment: Verbal basic Organizing: Impaired Organizing Impairment: Verbal basic Decision Making: Impaired Decision Making Impairment: Verbal basic Self Monitoring: Impaired Self Monitoring Impairment: Verbal basic Self Correcting: Impaired Self Correcting Impairment: Verbal basic Behaviors: Impulsive Safety/Judgment: Impaired  Comprehension Auditory Comprehension Overall Auditory Comprehension: Appears within functional limits for tasks assessed Expression Expression Primary Mode of Expression: Verbal Verbal Expression Overall Verbal Expression: Appears within functional limits for tasks assessed Written Expression Dominant Hand: Right Oral Motor Oral Motor/Sensory Function Overall Oral Motor/Sensory Function: Within functional limits Motor Speech Overall Motor Speech: Appears within functional limits for tasks assessed Intelligibility: Intelligible  Care Tool Care Tool Cognition Ability to hear  (with hearing aid or hearing appliances if normally used Ability to hear (with hearing aid or hearing appliances if normally used): 1. Minimal difficulty - difficulty in some environments (e.g. when person speaks softly or setting is noisy)   Expression of Ideas and Wants Expression of Ideas and Wants: 4. Without difficulty (complex and basic) - expresses complex messages without difficulty and with speech that is clear and easy to understand   Understanding Verbal and Non-Verbal Content Understanding Verbal and Non-Verbal Content: 3. Usually understands - understands most conversations, but misses some part/intent of message. Requires cues at times to understand  Memory/Recall Ability Memory/Recall Ability : That he or she is in a hospital/hospital unit   Intelligibility: Intelligible  Short Term Goals: Week 1: SLP Short Term Goal 1 (Week 1): Patient will utilize external memory aids to recall basic orientation information with 75% accuracy given max assist. SLP Short Term Goal 2 (Week 1): Patient will utilize external aids to state date in 4/5 opportunities given max assist. SLP Short Term Goal 3 (Week 1): Patient will utilize external aids/signs to implement basic safety awareness strategies in 4/5 opportunities given max assist. SLP Short Term Goal 4 (Week 1): Patient will demonstrate safety awareness during basic problem solving tasks with 80% accuracy given max assist.  Refer to Care Plan for Long Term Goals  Recommendations for other services: Neuropsych  Discharge Criteria: Patient will be discharged from SLP if patient refuses treatment 3 consecutive times without medical reason, if treatment goals not met, if there is a change in medical status, if patient makes no progress towards goals or if patient is discharged from hospital.  The above assessment, treatment plan, treatment alternatives and goals were discussed and mutually agreed upon: by patient  Jeannie Done, M.A.,  CCC-SLP  Yetta Barre 06/15/2023, 3:59 PM

## 2023-06-15 NOTE — Plan of Care (Signed)
  Problem: RH SAFETY Goal: RH STG ADHERE TO SAFETY PRECAUTIONS W/ASSISTANCE/DEVICE Description: STG Adhere to Safety Precautions With cues Assistance/Device. Outcome: Progressing   Problem: RH PAIN MANAGEMENT Goal: RH STG PAIN MANAGED AT OR BELOW PT'S PAIN GOAL Description: < 4 with prns Outcome: Progressing   Problem: RH KNOWLEDGE DEFICIT Goal: RH STG INCREASE KNOWLEDGE OF HYPERTENSION Description: Patient and dtr will be able to manage HTN using educational resources for medications and dietary modification independently Outcome: Progressing   Problem: RH KNOWLEDGE DEFICIT Goal: RH STG INCREASE KNOWLEGDE OF HYPERLIPIDEMIA Description: Patient and dtr will be able to manage HLD using educational resources for medications and dietary modification independently Outcome: Progressing   Problem: RH KNOWLEDGE DEFICIT Goal: RH STG INCREASE KNOWLEDGE OF STROKE PROPHYLAXIS Description: Patient and dtr will be able to manage secondary risks using educational resources for medications and dietary modification independently Outcome: Progressing

## 2023-06-15 NOTE — Plan of Care (Signed)
  Problem: RH Balance Goal: LTG: Patient will maintain dynamic sitting balance (OT) Description: LTG:  Patient will maintain dynamic sitting balance with assistance during activities of daily living (OT) Flowsheets (Taken 06/15/2023 1308) LTG: Pt will maintain dynamic sitting balance during ADLs with: Supervision/Verbal cueing Goal: LTG Patient will maintain dynamic standing with ADLs (OT) Description: LTG:  Patient will maintain dynamic standing balance with assist during activities of daily living (OT)  Flowsheets (Taken 06/15/2023 1308) LTG: Pt will maintain dynamic standing balance during ADLs with: Supervision/Verbal cueing   Problem: RH Eating Goal: LTG Patient will perform eating w/assist, cues/equip (OT) Description: LTG: Patient will perform eating with assist, with/without cues using equipment (OT) Flowsheets (Taken 06/15/2023 1308) LTG: Pt will perform eating with assistance level of: Supervision/Verbal cueing   Problem: RH Grooming Goal: LTG Patient will perform grooming w/assist,cues/equip (OT) Description: LTG: Patient will perform grooming with assist, with/without cues using equipment (OT) Flowsheets (Taken 06/15/2023 1308) LTG: Pt will perform grooming with assistance level of: Supervision/Verbal cueing   Problem: RH Bathing Goal: LTG Patient will bathe all body parts with assist levels (OT) Description: LTG: Patient will bathe all body parts with assist levels (OT) Flowsheets (Taken 06/15/2023 1308) LTG: Pt will perform bathing with assistance level/cueing: Supervision/Verbal cueing   Problem: RH Dressing Goal: LTG Patient will perform upper body dressing (OT) Description: LTG Patient will perform upper body dressing with assist, with/without cues (OT). Flowsheets (Taken 06/15/2023 1308) LTG: Pt will perform upper body dressing with assistance level of: Supervision/Verbal cueing Goal: LTG Patient will perform lower body dressing w/assist (OT) Description: LTG: Patient will  perform lower body dressing with assist, with/without cues in positioning using equipment (OT) Flowsheets (Taken 06/15/2023 1308) LTG: Pt will perform lower body dressing with assistance level of: Supervision/Verbal cueing   Problem: RH Toileting Goal: LTG Patient will perform toileting task (3/3 steps) with assistance level (OT) Description: LTG: Patient will perform toileting task (3/3 steps) with assistance level (OT)  Flowsheets (Taken 06/15/2023 1308) LTG: Pt will perform toileting task (3/3 steps) with assistance level: Supervision/Verbal cueing   Problem: RH Vision Goal: RH LTG Vision Consulting civil engineer) Flowsheets (Taken 06/15/2023 1308) LTG: Vision Goals: Pt will turn her head to the R to compensate for R visual field loss with minimal cues.   Problem: RH Functional Use of Upper Extremity Goal: LTG Patient will use RT/LT upper extremity as a (OT) Description: LTG: Patient will use right/left upper extremity as a stabilizer/gross assist/diminished/nondominant/dominant level with assist, with/without cues during functional activity (OT) Flowsheets (Taken 06/15/2023 1308) LTG: Use of upper extremity in functional activities: RUE as diminished level LTG: Pt will use upper extremity in functional activity with assistance level of: Supervision/Verbal cueing   Problem: RH Toilet Transfers Goal: LTG Patient will perform toilet transfers w/assist (OT) Description: LTG: Patient will perform toilet transfers with assist, with/without cues using equipment (OT) Flowsheets (Taken 06/15/2023 1308) LTG: Pt will perform toilet transfers with assistance level of: Supervision/Verbal cueing   Problem: RH Tub/Shower Transfers Goal: LTG Patient will perform tub/shower transfers w/assist (OT) Description: LTG: Patient will perform tub/shower transfers with assist, with/without cues using equipment (OT) Flowsheets (Taken 06/15/2023 1308) LTG: Pt will perform tub/shower stall transfers with assistance level of:  Supervision/Verbal cueing

## 2023-06-15 NOTE — Evaluation (Signed)
 Occupational Therapy Assessment and Plan  Patient Details  Name: Tara Martin MRN: [id number] Date of Birth: 1940-10-23  OT Diagnosis: apraxia, cognitive deficits, disturbance of vision, and hemiplegia affecting dominant side Rehab Potential: Rehab Potential (ACUTE ONLY): Fair ELOS: 10-12 days   Today's Date: 06/15/2023 OT Individual Time: 1045-1200 OT Individual Time Calculation (min): 75 min     Hospital Problem: Principal Problem:   Embolic stroke [facility])   Past Medical History:  Past Medical History:  Diagnosis Date   Anxiety state, unspecified - sees Dr. Jennette Kettle in gyn and benzo prescribed there 04/09/2013   Arthritis    Chicken pox    Hx of blood clots    jan 2025   Lupus    MCI (mild cognitive impairment) 05/20/2020   Migraines    Prediabetes    Past Surgical History:  Past Surgical History:  Procedure Laterality Date   BACK SURGERY  2000   CHOLECYSTECTOMY     DILATION AND CURETTAGE OF UTERUS  1973   GALLBLADDER SURGERY  1974    Assessment & Plan Clinical Impression:  Tara Martin. Fenster is an 83 year old RH female with history of anxiety d/o, migraines, lupus, Dx of RML lung mass, DVT and bilateral PE's 03/2023-on eliquis, late onset Alzheimer's with advancement (since Jan) who presented to ED on 06/08/23 with acute onset of MS changes, difficulty speaking and weakness. 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   MRI brain done revealing multifocal acute ischemia left occipital lobe, left temporal lobe, left thalamus and within bilateral parietal subcortical white matter and petechial hemorrhage within posterior right hemisphere. CT cervical spine with bilateral facet arthropathy with mild foraminal stenosis C3-C6 and  mild central canal stenosis C4-C7.  Eliquis placed on hold, ASA added and Pulmonary consulted for work up of spiculated Lung mass. Dr. Roda Shutters felt that stoke concerning for cardioembolic source v/s hypercarbic state from advanced malignancy. IV heparin added per stroke protocol pending biopsy and to transition to therapeutic dose lovenox after a day.  She underwent bronchoscopy today with reportedly positive RML airway and 4 nodes on contralateral side.  Final path pending. She continues to be limited by significant visual deficits w/ right neglect, fluent aphasia with poor awareness of deficits, right sided weakness and requires min assist with ADL/mobility. She was fully independent prior to Jan admission and CIR recommended due to functional decline.     Patient transferred to CIR on 06/14/2023 .    Patient currently requires min with basic self-care skills secondary to motor apraxia, decreased visual acuity, decreased visual perceptual skills, decreased visual motor skills, and hemianopsia, right side neglect, decreased attention, decreased awareness, decreased problem solving, decreased safety awareness, decreased memory, and delayed processing, and decreased standing balance, decreased postural control, hemiplegia, and decreased balance strategies.  Prior to hospitalization, patient could complete BADLs with independent .  Patient will benefit from skilled intervention to increase independence with basic self-care skills prior to discharge home with care partner.  Anticipate patient will require 24 hour supervision and follow up home health.  OT - End of Session Activity Tolerance: Tolerates 10 - 20 min activity with multiple rests Endurance Deficit: Yes OT Assessment Rehab Potential (ACUTE ONLY): Fair OT Patient demonstrates impairments in the following area(s): Balance;Cognition;Behavior;Motor;Endurance;Sensory;Safety;Perception;Vision OT Basic ADL's Functional Problem(s):  Eating;Grooming;Bathing;Toileting OT  Transfers Functional Problem(s): Toilet;Tub/Shower OT Additional Impairment(s): Fuctional Use of Upper Extremity OT Plan OT Intensity: Minimum of 1-2 x/day, 45 to 90 minutes OT Frequency: 5 out of 7 days OT Duration/Estimated Length of Stay: 10-12 days OT Treatment/Interventions: Balance/vestibular training;Cognitive remediation/compensation;Discharge planning;DME/adaptive equipment instruction;Functional mobility training;Neuromuscular re-education;Pain management;Patient/family education;Psychosocial support;Self Care/advanced ADL retraining;Therapeutic Activities;Therapeutic Exercise;UE/LE Strength taining/ROM;UE/LE Coordination activities;Visual/perceptual remediation/compensation OT Self Feeding Anticipated Outcome(s): supervision OT Basic Self-Care Anticipated Outcome(s): supervision OT Toileting Anticipated Outcome(s): supervision OT Bathroom Transfers Anticipated Outcome(s): supervision OT Recommendation Patient destination: Home Follow Up Recommendations: Home health OT;24 hour supervision/assistance Equipment Recommended: To be determined   OT Evaluation Precautions/Restrictions  Precautions Precautions: Fall Recall of Precautions/Restrictions: Impaired Precaution/Restrictions Comments: Fall, R hemi, R inattention, R visual field neglect Restrictions Weight Bearing Restrictions Per Provider Order: No   Pain Pain Assessment Pain Score: 0-No pain Home Living/Prior Functioning Home Living Family/patient expects to be discharged to:: Private residence Living Arrangements: Spouse/significant other Available Help at Discharge: Family, Available 24 hours/day Type of Home: House Home Access: Stairs to enter [employer] of Steps: 3 on the side and 5 in the front one rail on side with brick wall and bil rails in front Entrance Stairs-Rails: Left Home Layout: Two level, Able to live on main level with bedroom/bathroom Bathroom  Shower/Tub: Engineer, manufacturing systems: Handicapped height Bathroom Accessibility: Yes Additional Comments: lives with spouse who is visually impaired. 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 she had to use head turns to look to either side) Alignment/Gaze Preference: Gaze left Tracking/Visual Pursuits: Other (comment) (unable to follow commands,  on testing pt did turn head but unable to visual track without head movement) Visual Fields: Right homonymous hemianopsia;Right visual field deficit Depth Perception: Overshoots Additional Comments: Pt appears to have visual impairments in B eyes, with R visual field deficit Perception  Perception: Impaired Praxis Praxis: Impaired Praxis Impairment Details: Ideomotor;Motor planning;Organization;Limb apraxia Praxis-Other Comments: pt has great difficult using B hands in bimanual tasks (ie placing toothpaste on brush or putting soap onto wash cloth) Cognition Cognition Overall Cognitive Status: Impaired/Different from baseline (pt has history of dementia) Arousal/Alertness: Awake/alert Orientation Level: Person Memory: Impaired Memory Impairment: Decreased recall of new  information;Decreased short term memory;Storage deficit;Decreased long term memory;Retrieval deficit Sustained Attention: Impaired Awareness: Impaired Problem Solving: Impaired Behaviors: Impulsive Safety/Judgment: Impaired Brief Interview for Mental Status (BIMS) Repetition of Three Words (First Attempt): 3 Temporal Orientation: Year: Missed by 1 year Temporal Orientation: Month: Missed by 6 days to 1 month Temporal Orientation: Day: Incorrect Recall: "Sock": No, could not recall Recall: "Blue": Yes, after cueing ("a color") Recall: "Bed": No, could not recall BIMS Summary Score: 7 Sensation Sensation Light Touch: Impaired by gross assessment Hot/Cold: Appears Intact Proprioception: Impaired by gross assessment Stereognosis: Impaired by gross assessment Coordination Gross Motor Movements are Fluid and Coordinated: No  Fine Motor Movements are Fluid and Coordinated: No Coordination and Movement Description: FMC fluctuated, she can open small containers with B hands but has difficulty with Bilateral integration Finger Nose Finger Test: overshoots using either hand, had great difficulty following directions Motor  Motor Motor: Hemiplegia;Motor impersistence Motor - Skilled Clinical Observations: R hemi (functional in RUE, pt leans to R in standing and has decreased attention to RLE); pt has strong R grasp but drops objects due to motor impersistence  Trunk/Postural Assessment  Postural Control Postural Control: Deficits on evaluation Trunk Control: R lean in standing with fatigue, poor awareness of RLE placement impacts trunk control in standing (ie pt has poor proprioception of RLE and tends to stand with R foot abducted and posterior to R hip causing her to lean/ tilt to her R)  Balance Balance Balance Assessed: Yes Static Sitting Balance Static Sitting - Level of Assistance: 5: Stand by assistance (CGA/close supervision) Dynamic Sitting Balance Dynamic Sitting - Balance  Support: During functional activity;Bilateral upper extremity supported Dynamic Sitting - Level of Assistance: 4: Min Oncologist Standing - Balance Support: Right upper extremity supported Static Standing - Level of Assistance: 4: Min assist Dynamic Standing Balance Dynamic Standing - Level of Assistance: 4: Min assist Dynamic Standing - Balance Activities: Forward lean/weight shifting;Reaching for objects;Other (comment) (reaching for objects from floor) Extremity/Trunk Assessment RUE Assessment Active Range of Motion (AROM) Comments: WFL General Strength Comments: WFL LUE Assessment LUE Assessment: Within Functional Limits  Care Tool Care Tool Self Care Eating   Eating Assist Level: Minimal Assistance - Patient > 75%    Oral Care    Oral Care Assist Level: Minimal Assistance - Patient > 75%    Bathing   Body parts bathed by patient: Right arm;Left arm;Chest;Abdomen;Right upper leg;Buttocks;Front perineal area;Left upper leg;Right lower leg;Left lower leg;Face     Assist Level: Contact Guard/Touching assist    Upper Body Dressing(including orthotics)   What is the patient wearing?: Pull over shirt   Assist Level: Minimal Assistance - Patient > 75%    Lower Body Dressing (excluding footwear)   What is the patient wearing?: Underwear/pull up;Pants Assist for lower body dressing: Minimal Assistance - Patient > 75%    Putting on/Taking off footwear   What is the patient wearing?: Non-skid slipper socks Assist for footwear: Supervision/Verbal cueing       Care Tool Toileting Toileting activity   Assist for toileting: Minimal Assistance - Patient > 75%     Care Tool Bed Mobility Roll left and right activity    s    Sit to lying activity    s    Lying to sitting on side of bed activity    s     Care Tool Transfers Sit to stand transfer   Sit to stand assist level: Supervision/Verbal cueing    Chair/bed transfer   Chair/bed transfer  assist level: Minimal Assistance - Patient > 75%     Toilet transfer   Assist Level: Minimal Assistance - Patient > 75%     Care Tool Cognition  Expression of Ideas and Wants Expression of Ideas and Wants: 3. Some difficulty - exhibits some difficulty with expressing needs and ideas (e.g, some words or finishing thoughts) or speech is not clear  Understanding Verbal and Non-Verbal Content Understanding Verbal and Non-Verbal Content: 2. Sometimes understands - understands only basic conversations or simple, direct phrases. Frequently requires cues to understand   Memory/Recall Ability Memory/Recall Ability : That he or she  is in a hospital/hospital unit   Refer to Care Plan for Long Term Goals  SHORT TERM GOAL WEEK 1 OT Short Term Goal 1 (Week 1): Pt will demonstrate improved motor control of RUE to squeeze soap from bottle onto wash cloth. OT Short Term Goal 2 (Week 1): Pt will don a shirt with supervision. OT Short Term Goal 3 (Week 1): Pt will be able to ambulate from bed to toilet with CGA and only 1 cue if needed to find the toilet to demonstrate improved R visual scanning. OT Short Term Goal 4 (Week 1): Pt will be able to self feed with min A needed less than 25 % of the time.  Recommendations for other services: None    Skilled Therapeutic Intervention ADL ADL Eating: Minimal assistance Grooming: Minimal assistance Where Assessed-Grooming: Sitting at sink Upper Body Bathing: Supervision/safety;Moderate cueing Where Assessed-Upper Body Bathing: Shower Lower Body Bathing: Contact guard;Moderate cueing Where Assessed-Lower Body Bathing: Shower Upper Body Dressing: Minimal assistance;Moderate cueing Lower Body Dressing: Minimal assistance;Moderate cueing Toileting: Minimal assistance;Moderate cueing Where Assessed-Toileting: Teacher, adult education: Curator Method: Event organiser: Insurance underwriter  Method: Designer, industrial/product: Transfer tub bench;Grab bars Mobility  Bed Mobility Bed Mobility: Rolling Right;Rolling Left;Supine to [employer] Right: Supervision/verbal cueing Rolling Left: Supervision/Verbal cueing Supine to Sit: Minimal Assistance - Patient > 75% Transfers Sit to Stand: Minimal Assistance - Patient > 75% Stand to Sit: Minimal Assistance - Patient > 75%  Pt seen for initial evaluation and ADL training with a focus on R side awareness,  safety awareness.  Nursing student present during session.   Pt moves quite well and is flexible as she is easily able to cross her legs to doff/don socks and reach to pick up items from the floor.  Due to sensory, perceptual and visual deficits, pt is unable to use her R hand effectively.  She will reach down to pick up paper off the floor but will drop it instantly.  She dropped the wash cloth in the shower a few times.  Visual testing very difficult to assess accurately, but pt clearly has significant deficits.  She asked if her husband knew she was in the hospital, pt reassured he did.  She reasked the same question every few minutes.  Reviewed role of OT, discussed POC and pt's goals, and ELOS.  Pt will need family involvement as she is not able to fully understand what she is working on and discharge recommendations.   Nursing student with pt in w/c to assist her with drying her hair.   Discharge Criteria: Patient will be discharged from OT if patient refuses treatment 3 consecutive times without medical reason, if treatment goals not met, if there is a change in medical status, if patient makes no progress towards goals or if patient is discharged from hospital.  The above assessment, treatment plan, treatment alternatives and goals were discussed and mutually agreed upon: by patient  [facility] 06/15/2023, 12:56 PM

## 2023-06-15 NOTE — Plan of Care (Signed)
  Problem: RH Cognition - SLP Goal: RH LTG Patient will demonstrate orientation with cues Description:  LTG:  Patient will demonstrate orientation to person/place/time/situation with cues (SLP)   Flowsheets (Taken 06/15/2023 1615) LTG: Patient will demonstrate orientation using cueing (SLP): Moderate Assistance - Patient 50 - 74%   Problem: RH Memory Goal: LTG Patient will use memory compensatory aids to (SLP) Description: LTG:  Patient will use memory compensatory aids to recall biographical/new, daily complex information with cues (SLP) Flowsheets (Taken 06/15/2023 1615) LTG: Patient will use memory compensatory aids to (SLP): Moderate Assistance - Patient 50 - 74%   Problem: RH Awareness Goal: LTG: Patient will demonstrate awareness during functional activites type of (SLP) Description: LTG: Patient will demonstrate awareness during functional activites type of (SLP) Flowsheets (Taken 06/15/2023 1615) LTG: Patient will demonstrate awareness during cognitive/linguistic activities with assistance of (SLP): Moderate Assistance - Patient 50 - 74%

## 2023-06-15 NOTE — Plan of Care (Signed)
  Problem: RH Balance Goal: LTG Patient will maintain dynamic sitting balance (PT) Description: LTG:  Patient will maintain dynamic sitting balance with assistance during mobility activities (PT) Flowsheets (Taken 06/15/2023 1547) LTG: Pt will maintain dynamic sitting balance during mobility activities with:: Supervision/Verbal cueing Goal: LTG Patient will maintain dynamic standing balance (PT) Description: LTG:  Patient will maintain dynamic standing balance with assistance during mobility activities (PT) Flowsheets (Taken 06/15/2023 1547) LTG: Pt will maintain dynamic standing balance during mobility activities with:: Supervision/Verbal cueing   Problem: Sit to Stand Goal: LTG:  Patient will perform sit to stand with assistance level (PT) Description: LTG:  Patient will perform sit to stand with assistance level (PT) Flowsheets (Taken 06/15/2023 1547) LTG: PT will perform sit to stand in preparation for functional mobility with assistance level: Supervision/Verbal cueing   Problem: RH Bed Mobility Goal: LTG Patient will perform bed mobility with assist (PT) Description: LTG: Patient will perform bed mobility with assistance, with/without cues (PT). Flowsheets (Taken 06/15/2023 1547) LTG: Pt will perform bed mobility with assistance level of: Supervision/Verbal cueing   Problem: RH Bed to Chair Transfers Goal: LTG Patient will perform bed/chair transfers w/assist (PT) Description: LTG: Patient will perform bed to chair transfers with assistance (PT). Flowsheets (Taken 06/15/2023 1547) LTG: Pt will perform Bed to Chair Transfers with assistance level: Supervision/Verbal cueing   Problem: RH Car Transfers Goal: LTG Patient will perform car transfers with assist (PT) Description: LTG: Patient will perform car transfers with assistance (PT). Flowsheets (Taken 06/15/2023 1547) LTG: Pt will perform car transfers with assist:: Supervision/Verbal cueing   Problem: RH Furniture Transfers Goal: LTG  Patient will perform furniture transfers w/assist (OT/PT) Description: LTG: Patient will perform furniture transfers  with assistance (OT/PT). Flowsheets (Taken 06/15/2023 1547) LTG: Pt will perform furniture transfers with assist:: Supervision/Verbal cueing   Problem: RH Ambulation Goal: LTG Patient will ambulate in controlled environment (PT) Description: LTG: Patient will ambulate in a controlled environment, # of feet with assistance (PT). Flowsheets (Taken 06/15/2023 1547) LTG: Pt will ambulate in controlled environ  assist needed:: Supervision/Verbal cueing LTG: Ambulation distance in controlled environment: 150 feet with LRAD Goal: LTG Patient will ambulate in home environment (PT) Description: LTG: Patient will ambulate in home environment, # of feet with assistance (PT). Flowsheets (Taken 06/15/2023 1547) LTG: Pt will ambulate in home environ  assist needed:: Supervision/Verbal cueing LTG: Ambulation distance in home environment: 150 feet with LRAD

## 2023-06-15 NOTE — Progress Notes (Signed)
 PROGRESS NOTE   Subjective/Complaints:  Patient went to the bathroom with therapy assistance.  Does not remember what she had for breakfast.  Positive circumlocution.  Denies any pain complaints.  States that her husband helps her at home but he is legally blind  ROS-negative chest pain shortness of breath nausea vomiting diarrhea constipation  Objective:   No results found. Recent Labs    06/14/23 0431 06/15/23 0518  WBC 3.9* 5.3  HGB 9.3* 9.6*  HCT 29.6* 30.5*  PLT 272 262   Recent Labs    06/14/23 0431 06/15/23 0518  NA 136 136  K 4.3 4.4  CL 102 106  CO2 23 20*  GLUCOSE 110* 126*  BUN 16 17  CREATININE 0.95 0.90  CALCIUM 8.8* 9.1   No intake or output data in the 24 hours ending 06/15/23 0758      Physical Exam: Vital Signs Blood pressure 130/70, pulse 60, temperature (!) (P) 97.4 F (36.3 C), temperature source (P) Oral, resp. rate 17, height 5\' 5"  (1.651 m), weight 59.4 kg, SpO2 93%.   General: No acute distress Mood and affect mildly irritable when asked orientation questions. Heart: Regular rate and rhythm no rubs murmurs or extra sounds Lungs: Clear to auscultation, breathing unlabored, no rales or wheezes Abdomen: Positive bowel sounds, soft nontender to palpation, nondistended Extremities: No clubbing, cyanosis, or edema Skin: No evidence of breakdown, no evidence of rash Neurologic: Cranial nerves II through XII intact, motor strength is 5/5 in bilateral deltoid, bicep, tricep, grip, hip flexor, knee extensors, ankle dorsiflexor and plantar flexor Right homonymous hemianopsia Oriented to person and place but not time Cerebellar exam normal finger to nose to finger as well as heel to shin in bilateral upper and lower extremities Musculoskeletal: Full range of motion in all 4 extremities. No joint swelling   Assessment/Plan: 1. Functional deficits which require 3+ hours per day of  interdisciplinary therapy in a comprehensive inpatient rehab setting. Physiatrist is providing close team supervision and 24 hour management of active medical problems listed below. Physiatrist and rehab team continue to assess barriers to discharge/monitor patient progress toward functional and medical goals  Care Tool:  Bathing              Bathing assist       Upper Body Dressing/Undressing Upper body dressing        Upper body assist      Lower Body Dressing/Undressing Lower body dressing            Lower body assist       Toileting Toileting    Toileting assist       Transfers Chair/bed transfer  Transfers assist           Locomotion Ambulation   Ambulation assist              Walk 10 feet activity   Assist           Walk 50 feet activity   Assist           Walk 150 feet activity   Assist           Walk 10 feet on uneven surface  activity   Assist           Wheelchair     Assist               Wheelchair 50 feet with 2 turns activity    Assist            Wheelchair 150 feet activity     Assist          Blood pressure 130/70, pulse 60, temperature (!) (P) 97.4 F (36.3 C), temperature source (P) Oral, resp. rate 17, height 5\' 5"  (1.651 m), weight 59.4 kg, SpO2 93%.  Medical Problem List and Plan: 1. 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              -patient may  shower-              -ELOS/Goals: 12-18 days supervision due to poor memory             Admit to CIR 2.  Antithrombotics: -DVT/anticoagulation:  Pharmaceutical: Lovenox             -antiplatelet therapy: ASA 3. Pain Management: Tylenol prn for pain.  4. Mood/Behavior/Sleep: LCSW to follow for evaluation and support.              -antipsychotic agents: N/A 5. Neuropsych/cognition: This patient is not fully capable of making  decisions on of own behalf. 6. Skin/Wound Care: Routine pressure relief measures.  7. Fluids/Electrolytes/Nutrition: Monitor intake. Check CMET in am.  8. PE/DVT: To start treatment dose of lovenox in am- R>L PE onset in January 2025 9. Moderate degree dementia: just dx'd Advancement past PE/hypoxia per 05/2023 visit.  --Now back on Aricept.  Recent memory impaired orientation impaired, mild irritability but no agitation thus far. 10. Bronch/Biopsy of 4.4 x 2+ cm R middle lung nodule that's suspicious for cancer, cytology still in process 11. HLD- con't statin    LOS: 1 days A FACE TO FACE EVALUATION WAS PERFORMED  Erick Colace 06/15/2023, 7:58 AM

## 2023-06-15 NOTE — Evaluation (Signed)
 Physical Therapy Assessment and Plan  Patient Details  Name: Tara Martin MRN: [id number] Date of Birth: 1940/08/22  PT Diagnosis: Abnormal posture, Abnormality of gait, Cognitive deficits, Difficulty walking, Hemiparesis dominant, Impaired cognition, Impaired sensation, and Muscle weakness Rehab Potential: Fair ELOS: 10-14 days   Today's Date: 06/15/2023 PT Individual Time: 0820-0915 PT Individual Time Calculation (min): 55 min  and Today's Date: 06/15/2023 PT Missed Time: 20 Minutes Missed Time Reason: Other (Comment) (pt eating)   Hospital Problem: Principal Problem:   Embolic stroke [facility])   Past Medical History:  Past Medical History:  Diagnosis Date   Anxiety state, unspecified - sees Dr. Jennette Kettle in gyn and benzo prescribed there 04/09/2013   Arthritis    Chicken pox    Hx of blood clots    jan 2025   Lupus    MCI (mild cognitive impairment) 05/20/2020   Migraines    Prediabetes    Past Surgical History:  Past Surgical History:  Procedure Laterality Date   BACK SURGERY  2000   CHOLECYSTECTOMY     DILATION AND CURETTAGE OF UTERUS  1973   GALLBLADDER SURGERY  1974    Assessment & Plan Clinical Impression: Patient is a 83 y.o. year old female with  history of anxiety d/o, migraines, lupus, Dx of RML lung mass, DVT and bilateral PE's 03/2023-on eliquis, late onset Alzheimer's with advancement (since Jan) who presented to ED on 06/08/23 with acute onset of MS changes, difficulty speaking and weakness. 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   MRI brain done revealing multifocal acute ischemia left occipital lobe, left temporal lobe, left thalamus and within bilateral parietal subcortical white matter and petechial  hemorrhage within posterior right hemisphere. CT cervical spine with bilateral facet arthropathy with mild foraminal stenosis C3-C6 and mild central canal stenosis C4-C7.  Eliquis placed on hold, ASA added and Pulmonary consulted for work up of spiculated Lung mass. Dr. Roda Shutters felt that stoke concerning for cardioembolic source v/s hypercarbic state from advanced malignancy. IV heparin added per stroke protocol pending biopsy and to transition to therapeutic dose lovenox after a day.  She underwent bronchoscopy today with reportedly positive RML airway and 4 nodes on contralateral side.  Final path pending. She continues to be limited by significant visual deficits w/ right neglect, fluent aphasia with poor awareness of deficits, right sided weakness and requires min assist with ADL/mobility. She was fully independent prior to Jan admission and CIR recommended due to functional decline.    Patient currently requires min with mobility secondary to muscle weakness, decreased cardiorespiratoy endurance, impaired timing and sequencing, unbalanced muscle activation, decreased coordination, and decreased motor planning, decreased visual perceptual skills and field cut, decreased attention to right, right side neglect, and decreased motor planning, decreased attention, decreased awareness, decreased problem solving, decreased safety awareness, decreased memory, and delayed processing, and decreased sitting balance, decreased standing balance, decreased postural control, hemiplegia, and decreased balance strategies.  Prior to hospitalization, patient was modified independent  with mobility and lived with Spouse in a House home.  Home access is 3 on the side and 5 in the front one rail on side with brick wall and bil rails in frontStairs to enter.  Patient will benefit from skilled PT intervention to maximize safe functional mobility, minimize fall risk, and decrease caregiver  burden for planned discharge home with 24 hour  supervision.  Anticipate patient will benefit from follow up HH at discharge.  PT - End of Session Activity Tolerance: Tolerates 30+ min activity with multiple rests Endurance Deficit: Yes PT Assessment Rehab Potential (ACUTE/IP ONLY): Fair PT Barriers to Discharge: Decreased caregiver support;Home environment access/layout;Incontinence;Lack of/limited family support PT Barriers to Discharge Comments: baseline dementia PT Patient demonstrates impairments in the following area(s): Balance;Behavior;Edema;Endurance;Motor;Perception;Safety;Sensory;Skin Integrity PT Transfers Functional Problem(s): Bed Mobility;Bed to Chair;Car;Furniture PT Locomotion Functional Problem(s): Ambulation;Wheelchair Mobility;Stairs PT Plan PT Intensity: Minimum of 1-2 x/day ,45 to 90 minutes PT Frequency: 5 out of 7 days PT Duration Estimated Length of Stay: 10-14 days PT Treatment/Interventions: Ambulation/gait training;Community reintegration;DME/adaptive equipment instruction;Neuromuscular re-education;Psychosocial support;Stair training;UE/LE Strength taining/ROM;Wheelchair propulsion/positioning;Balance/vestibular training;Discharge planning;Functional electrical stimulation;Pain management;Skin care/wound management;Therapeutic Activities;UE/LE Coordination activities;Cognitive remediation/compensation;Disease management/prevention;Functional mobility training;Patient/family education;Splinting/orthotics;Therapeutic Exercise;Visual/perceptual remediation/compensation PT Transfers Anticipated Outcome(s): supervision PT Locomotion Anticipated Outcome(s): supervision PT Recommendation Recommendations for Other Services: Speech consult Follow Up Recommendations: Home health PT Patient destination: Home Equipment Recommended: To be determined   PT Evaluation Precautions/Restrictions Precautions Precautions: Fall Recall of Precautions/Restrictions: Impaired Precaution/Restrictions Comments: Fall, R hemi, R  inattention, R visual field neglect Pain Interference Pain Interference Pain Effect on Sleep: 1. Rarely or not at all Pain Interference with Therapy Activities: 1. Rarely or not at all Pain Interference with Day-to-Day Activities: 1. Rarely or not at all Home Living/Prior Functioning Home Living Available Help at Discharge: Family;Available 24 hours/day Type of Home: House Home Access: Stairs to enter [employer] of Steps: 3 on the side and 5 in the front one rail on side with brick wall and bil rails in front Entrance Stairs-Rails: Left Home Layout: Two level;Able to live on main level with bedroom/bathroom Bathroom Shower/Tub: Tub/shower unit Bathroom Toilet: Handicapped height Bathroom Accessibility: Yes Additional Comments: lives with spouse who is visually impaired. Daughter reports she can stay as long as needed; home set up and PLOF taken from H&P 2/2 pt poor historian; need to verify with family  Lives With: Spouse Prior Function Level of Independence: Independent with gait;Independent with basic ADLs;Independent with transfers  Able to Take Stairs?: Yes Driving: Yes Vocation: Retired Gaffer: retired Field seismologist - History Ability to See in Adequate Light: 3 Highly impaired Vision - Assessment Ocular Range of Motion: Restricted on the right;Restricted on the left;Restricted looking up;Restricted looking down Alignment/Gaze Preference: Gaze left Tracking/Visual Pursuits: Other (comment) (unable to follow commands,  on testing pt did turn head but unable to visual track without head movement) Additional Comments: Pt appears to have visual impairments in B eyes, with R visual field deficit Perception Perception: Impaired Preception Impairment Details: Inattention/Neglect;Spatial orientation Praxis Praxis: Impaired Praxis Impairment Details: Ideomotor;Motor planning;Organization;Limb apraxia Praxis-Other Comments: pt has great  difficult using B hands in bimanual tasks (ie placing toothpaste on brush or putting soap onto wash cloth)  Cognition Overall Cognitive Status: Impaired/Different from baseline (pt has history of dementia) Arousal/Alertness: Awake/alert Orientation Level: Oriented to person;Disoriented to time;Disoriented to situation;Disoriented to place Year:  (1992) Month: November Day of Week: Incorrect Sustained Attention: Impaired Memory: Impaired Memory Impairment: Decreased recall of new information;Decreased short term memory;Storage deficit;Decreased long term memory;Retrieval deficit Awareness: Impaired Problem Solving: Impaired Behaviors: Impulsive Safety/Judgment: Impaired Sensation Sensation Light Touch: Impaired by gross assessment Hot/Cold: Appears Intact Proprioception: Impaired by gross assessment Stereognosis: Impaired by gross assessment Coordination Gross Motor Movements are Fluid and Coordinated: No Fine Motor Movements are Fluid and Coordinated: No Coordination and Movement Description: FMC fluctuated, she  can open small containers with B hands but has difficulty with Bilateral integration Finger Nose Finger Test: overshoots using either hand, had great difficulty following directions Motor  Motor Motor: Hemiplegia;Motor impersistence Motor - Skilled Clinical Observations: R hemi (functional in RUE, pt leans to R in standing and has decreased attention to RLE); pt has strong R grasp but drops objects due to motor impersistence  Trunk/Postural Assessment  Cervical Assessment Cervical Assessment: Within Functional Limits Thoracic Assessment Thoracic Assessment: Within Functional Limits Lumbar Assessment Lumbar Assessment: Within Functional Limits Postural Control Postural Control: Deficits on evaluation Trunk Control: R lean in standing with fatigue, poor awareness of RLE placement impacts trunk control in standing (ie pt has poor proprioception of RLE and tends to stand  with R foot abducted and posterior to R hip causing her to lean/ tilt to her R)  Balance Balance Balance Assessed: Yes Static Sitting Balance Static Sitting - Level of Assistance: 5: Stand by assistance (CGA/close supervision) Dynamic Sitting Balance Dynamic Sitting - Balance Support: During functional activity;Bilateral upper extremity supported Dynamic Sitting - Level of Assistance: 4: Min Oncologist Standing - Balance Support: Right upper extremity supported Static Standing - Level of Assistance: 4: Min assist Dynamic Standing Balance Dynamic Standing - Level of Assistance: 4: Min assist Dynamic Standing - Balance Activities: Forward lean/weight shifting;Reaching for objects;Other (comment) (reaching for objects from floor) Extremity Assessment  RLE Assessment RLE Assessment: Exceptions to [facility] General Strength Comments: not formally assessed, grossly 3+/5 LLE Assessment LLE Assessment: Exceptions to [facility] General Strength Comments: not formally assessed, grossly 3+/5  Care Tool Care Tool Bed Mobility Roll left and right activity   Roll left and right assist level: Supervision/Verbal cueing    Sit to lying activity   Sit to lying assist level: Minimal Assistance - Patient > 75%    Lying to sitting on side of bed activity   Lying to sitting on side of bed assist level: the ability to move from lying on the back to sitting on the side of the bed with no back support.: Minimal Assistance - Patient > 75%     Care Tool Transfers Sit to stand transfer   Sit to stand assist level: Minimal Assistance - Patient > 75%    Chair/bed transfer   Chair/bed transfer assist level: Minimal Assistance - Patient > 75%    Car transfer   Car transfer assist level: Minimal Assistance - Patient > 75%      Care Tool Locomotion Ambulation   Assist level: Minimal Assistance - Patient > 75% Assistive device: No Device Max distance: 300+  Walk 10 feet activity    Assist level: Minimal Assistance - Patient > 75% Assistive device: No Device;Hand held assist   Walk 50 feet with 2 turns activity   Assist level: Minimal Assistance - Patient > 75% Assistive device: No Device;Hand held assist  Walk 150 feet activity   Assist level: Minimal Assistance - Patient > 75% Assistive device: No Device;Hand held assist  Walk 10 feet on uneven surfaces activity   Assist level: Minimal Assistance - Patient > 75% Assistive device: Hand held assist  Stairs   Assist level: Minimal Assistance - Patient > 75% Stairs assistive device: 2 hand rails Max number of stairs: 8  Walk up/down 1 step activity   Walk up/down 1 step (curb) assist level: Minimal Assistance - Patient > 75% Walk up/down 1 step or curb assistive device: 2 hand rails  Walk up/down 4 steps activity  Walk up/down 4 steps assist level: Minimal Assistance - Patient > 75% Walk up/down 4 steps assistive device: 2 hand rails  Walk up/down 12 steps activity Walk up/down 12 steps activity did not occur: Safety/medical concerns      Pick up small objects from floor   Pick up small object from the floor assist level: Minimal Assistance - Patient > 75% Pick up small object from the floor assistive device: no AD  Wheelchair Is the patient using a wheelchair?: Yes     Wheelchair assist level: Dependent - Patient 0%    Wheel 50 feet with 2 turns activity   Assist Level: Dependent - Patient 0%  Wheel 150 feet activity   Assist Level: Dependent - Patient 0%    Refer to Care Plan for Long Term Goals  SHORT TERM GOAL WEEK 1 PT Short Term Goal 1 (Week 1): pt will perform stand pivot transfer with LRAD and CGA PT Short Term Goal 2 (Week 1): pt will ambulate 150 feet with LRAD and CGA PT Short Term Goal 3 (Week 1): pt will navigate 5 steps with B HR and CGA  Recommendations for other services: None   Skilled Therapeutic Intervention Mobility Bed Mobility Bed Mobility: Rolling Right;Rolling  Left;Supine to Sit Rolling Right: Supervision/verbal cueing Rolling Left: Supervision/Verbal cueing Supine to Sit: Minimal Assistance - Patient > 75% Transfers Transfers: Sit to Stand;Stand to Sit;Stand Pivot Transfers Sit to Stand: Minimal Assistance - Patient > 75% Stand to Sit: Minimal Assistance - Patient > 75% Stand Pivot Transfers: Minimal Assistance - Patient > 75% Transfer (Assistive device): None Locomotion  Gait Ambulation: Yes Gait Assistance: Minimal Assistance - Patient > 75% Gait Distance (Feet): 300 Feet Assistive device: None Gait Assistance Details: Verbal cues for precautions/safety;Verbal cues for gait pattern;Verbal cues for safe use of DME/AE Gait Assistance Details: trailed gait with no AD and with RW--both require min A Gait Gait: Yes Gait Pattern: Impaired Gait Pattern: Step-through pattern;Decreased stance time - right;Decreased stride length;Decreased hip/knee flexion - right;Decreased weight shift to right;Poor foot clearance - right Stairs / Additional Locomotion Stairs: Yes Stairs Assistance: Minimal Assistance - Patient > 75% Stair Management Technique: Two rails Number of Stairs: 8 Height of Stairs: 6 Ramp: Minimal Assistance - Patient >75% Wheelchair Mobility Wheelchair Mobility: Yes Wheelchair Assistance: Dependent - Patient 0% Wheelchair Parts Management: Needs assistance   Discharge Criteria: Patient will be discharged from PT if patient refuses treatment 3 consecutive times without medical reason, if treatment goals not met, if there is a change in medical status, if patient makes no progress towards goals or if patient is discharged from hospital.  The above assessment, treatment plan, treatment alternatives and goals were discussed and mutually agreed upon: by patient  Today's Interventions  Evaluation completed (see details above and below) with education on PT POC and goals and individual treatment initiated with focus on attention to R  UE/LE.   Pt missed 15 minutes 2/2 eating breakfast.   Pt agreeable to therpay. Pt denies any pain.   Pt A&O to self, city and state she is in, that she is in hospital post stroke. Pt otherwise disoriented but very sweet.   Bed mobility: rolling B with supervision, supine to sit with min A 2/2 R lateral trunk lean and posterior bias.   Transfers: sit to stand and stand pivot transfer with min A with no AD, and with RW, max verbal cues provided for safety with RW especially with ambualtory transfers. Pt performed stand pivot transfer WC  to car simuator with no AD and min A.   Gait: x300+ feet with no AD and with and without R HHA with min A for lateral LOB to R, inconsistent stepping pattern, verbal cues provided scanning to R of environment for obstacle negotiation   Stair navigation: x8 6 inch steps with B HR and min A--verbal cues provided for R UE/R LE positioning.   MD present for AM rounds. Pt seated in [facility] with all needs within reach and seatbelt alarm on. Therapist positioned table in front of pt with written reminder "call for help (press red button) if you need anything."    [facility] Ambrose Finland, PT, DPT  06/15/2023, 3:30 PM

## 2023-06-16 DIAGNOSIS — G301 Alzheimer's disease with late onset: Secondary | ICD-10-CM | POA: Diagnosis not present

## 2023-06-16 DIAGNOSIS — F02B3 Dementia in other diseases classified elsewhere, moderate, with mood disturbance: Secondary | ICD-10-CM | POA: Diagnosis not present

## 2023-06-16 DIAGNOSIS — I6340 Cerebral infarction due to embolism of unspecified cerebral artery: Secondary | ICD-10-CM | POA: Diagnosis not present

## 2023-06-16 DIAGNOSIS — H53461 Homonymous bilateral field defects, right side: Secondary | ICD-10-CM | POA: Diagnosis not present

## 2023-06-16 LAB — CYTOLOGY - NON PAP

## 2023-06-16 MED ORDER — ENOXAPARIN (LOVENOX) PATIENT EDUCATION KIT
PACK | Freq: Once | Status: AC
  Filled 2023-06-16: qty 1

## 2023-06-16 NOTE — Progress Notes (Signed)
 PROGRESS NOTE   Subjective/Complaints:  Eating from left side of plate, repetitively asking SLP what temp is it outside We discussed stroke location and field cut   ROS-negative chest pain shortness of breath nausea vomiting diarrhea constipation  Objective:   No results found. Recent Labs    06/14/23 0431 06/15/23 0518  WBC 3.9* 5.3  HGB 9.3* 9.6*  HCT 29.6* 30.5*  PLT 272 262   Recent Labs    06/14/23 0431 06/15/23 0518  NA 136 136  K 4.3 4.4  CL 102 106  CO2 23 20*  GLUCOSE 110* 126*  BUN 16 17  CREATININE 0.95 0.90  CALCIUM 8.8* 9.1    Intake/Output Summary (Last 24 hours) at 06/16/2023 0813 Last data filed at 06/15/2023 1913 Gross per 24 hour  Intake 177 ml  Output --  Net 177 ml        Physical Exam: Vital Signs Blood pressure 124/63, pulse (!) 59, temperature 98.2 F (36.8 C), resp. rate 17, height 5\' 5"  (1.651 m), weight 59.4 kg, SpO2 97%.   General: No acute distress Mood and affect mildly irritable when asked orientation questions. Heart: Regular rate and rhythm no rubs murmurs or extra sounds Lungs: Clear to auscultation, breathing unlabored, no rales or wheezes Abdomen: Positive bowel sounds, soft nontender to palpation, nondistended Extremities: No clubbing, cyanosis, or edema Skin: No evidence of breakdown, no evidence of rash Neurologic: Cranial nerves II through XII intact, motor strength is 5/5 in bilateral deltoid, bicep, tricep, grip, hip flexor, knee extensors, ankle dorsiflexor and plantar flexor Right homonymous hemianopsia-dense  Oriented to person and place but not time  Musculoskeletal: Full range of motion in all 4 extremities. No joint swelling   Assessment/Plan: 1. Functional deficits which require 3+ hours per day of interdisciplinary therapy in a comprehensive inpatient rehab setting. Physiatrist is providing close team supervision and 24 hour management of active  medical problems listed below. Physiatrist and rehab team continue to assess barriers to discharge/monitor patient progress toward functional and medical goals  Care Tool:  Bathing    Body parts bathed by patient: Right arm, Left arm, Chest, Abdomen, Right upper leg, Buttocks, Front perineal area, Left upper leg, Right lower leg, Left lower leg, Face         Bathing assist Assist Level: Contact Guard/Touching assist     Upper Body Dressing/Undressing Upper body dressing   What is the patient wearing?: Pull over shirt    Upper body assist Assist Level: Minimal Assistance - Patient > 75%    Lower Body Dressing/Undressing Lower body dressing      What is the patient wearing?: Underwear/pull up, Pants     Lower body assist Assist for lower body dressing: Minimal Assistance - Patient > 75%     Toileting Toileting    Toileting assist Assist for toileting: Moderate Assistance - Patient 50 - 74%     Transfers Chair/bed transfer  Transfers assist     Chair/bed transfer assist level: Minimal Assistance - Patient > 75%     Locomotion Ambulation   Ambulation assist      Assist level: Minimal Assistance - Patient > 75% Assistive device: No Device Max  distance: 300+   Walk 10 feet activity   Assist     Assist level: Minimal Assistance - Patient > 75% Assistive device: No Device, Hand held assist   Walk 50 feet activity   Assist    Assist level: Minimal Assistance - Patient > 75% Assistive device: No Device, Hand held assist    Walk 150 feet activity   Assist    Assist level: Minimal Assistance - Patient > 75% Assistive device: No Device, Hand held assist    Walk 10 feet on uneven surface  activity   Assist     Assist level: Minimal Assistance - Patient > 75% Assistive device: Hand held assist   Wheelchair     Assist Is the patient using a wheelchair?: Yes      Wheelchair assist level: Dependent - Patient 0%      Wheelchair  50 feet with 2 turns activity    Assist        Assist Level: Dependent - Patient 0%   Wheelchair 150 feet activity     Assist      Assist Level: Dependent - Patient 0%   Blood pressure 124/63, pulse (!) 59, temperature 98.2 F (36.8 C), resp. rate 17, height 5\' 5"  (1.651 m), weight 59.4 kg, SpO2 97%.  Medical Problem List and Plan: 1. 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              -patient may  shower-              -ELOS/Goals: 12-18 days supervision due to poor memory- could not set d/c date yet in team conf             CIR PT, OT, SLP  2.  Antithrombotics: -DVT/anticoagulation:  Pharmaceutical: Lovenox             -antiplatelet therapy: ASA 3. Pain Management: Tylenol prn for pain.  4. Mood/Behavior/Sleep: LCSW to follow for evaluation and support.              -antipsychotic agents: N/A 5. Neuropsych/cognition: This patient is not fully capable of making decisions on of own behalf. 6. Skin/Wound Care: Routine pressure relief measures.  7. Fluids/Electrolytes/Nutrition: Monitor intake. Check CMET in am.  8. PE/DVT: To start treatment dose of lovenox in am- R>L PE onset in January 2025 9. Moderate degree dementia: just dx'd Advancement past PE/hypoxia per 05/2023 visit.  --Now back on Aricept.  Recent memory impaired orientation impaired, mild irritability but no agitation thus far. 10. Bronch/Biopsy of 4.4 x 2+ cm R middle lung nodule that's suspicious for cancer, cytology still in process 11. HLD- con't statin    LOS: 2 days A FACE TO FACE EVALUATION WAS PERFORMED  Erick Colace 06/16/2023, 8:13 AM

## 2023-06-16 NOTE — Progress Notes (Signed)
 Patient ID: Tara Martin, female   DOB: [birth year], 83 y.o.   MRN: [id number]  65- SW spoke with pt husband to introduce self, explain role, discuss discharge process, and inform on ELOS. He confirms he will be primary caregiver. States he would like for his wife to be bale to perform self-care needs; he can also help her with this as well. States they have a cleaning lady that helps with housekeeping, and friends/church family help with taking to appointments and driving since he is unable too. SW will follow-up once there are more updates.   Cecile Sheerer, MSW, LCSW Office: [phone] Cell: [phone] Fax: [phone]
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
## 2023-06-16 NOTE — Anesthesia Postprocedure Evaluation (Signed)
 Anesthesia Post Note  Patient: LEQUITA MEADOWCROFT  Procedure(s) Performed: BRONCHOSCOPY, WITH EBUS (Bilateral) BRONCHOSCOPY, WITH BRUSH BIOPSY BRONCHOSCOPY, WITH NEEDLE ASPIRATION BIOPSY     Patient location during evaluation: PACU Anesthesia Type: General Level of consciousness: awake and alert Pain management: pain level controlled Vital Signs Assessment: post-procedure vital signs reviewed and stable Respiratory status: spontaneous breathing, nonlabored ventilation, respiratory function stable and patient connected to nasal cannula oxygen Cardiovascular status: blood pressure returned to baseline and stable Postop Assessment: no apparent nausea or vomiting Anesthetic complications: no   No notable events documented.  Last Vitals:  Vitals:   06/14/23 1550 06/14/23 1554  BP: 109/60 114/66  Pulse: 62 60  Resp: 17 17  Temp:  36.8 C  SpO2: 92% 92%    Last Pain:  Vitals:   06/14/23 1554  TempSrc: Temporal  PainSc: 0-No pain                 Kennieth Rad

## 2023-06-16 NOTE — Care Management (Signed)
 Inpatient Rehabilitation Center Individual Statement of Services  Patient Name:  Tara Martin  Date:  06/16/2023  Welcome to the Inpatient Rehabilitation Center.  Our goal is to provide you with an individualized program based on your diagnosis and situation, designed to meet your specific needs.  With this comprehensive rehabilitation program, you will be expected to participate in at least 3 hours of rehabilitation therapies Monday-Friday, with modified therapy programming on the weekends.  Your rehabilitation program will include the following services:  Physical Therapy (PT), Occupational Therapy (OT), Speech Therapy (ST), 24 hour per day rehabilitation nursing, Therapeutic Recreaction (TR), Psychology, Neuropsychology, Care Coordinator, Rehabilitation Medicine, Nutrition Services, Pharmacy Services, and Other  Weekly team conferences will be held on Wednesdays to discuss your progress.  Your Inpatient Rehabilitation Care Coordinator will talk with you frequently to get your input and to update you on team discussions.  Team conferences with you and your family in attendance may also be held.  Expected length of stay: 10-14 days    Overall anticipated outcome: Supervision  Depending on your progress and recovery, your program may change. Your Inpatient Rehabilitation Care Coordinator will coordinate services and will keep you informed of any changes. Your Inpatient Rehabilitation Care Coordinator's name and contact numbers are listed  below.  The following services may also be recommended but are not provided by the Inpatient Rehabilitation Center:  Driving Evaluations Home Health Rehabiltiation Services Outpatient Rehabilitation Services Vocational Rehabilitation   Arrangements will be made to provide these services after discharge if needed.  Arrangements include referral to agencies that provide these services.  Your insurance has been verified to be:  [employer]  Your  primary doctor is:  Government social research officer  Pertinent information will be shared with your doctor and your insurance company.  Inpatient Rehabilitation Care Coordinator:  Susie Cassette [phone] or ([phone]  Information discussed with and copy given to patient by: Gretchen Short, 06/16/2023, 11:52 AM

## 2023-06-16 NOTE — Progress Notes (Signed)
 Occupational Therapy Session Note  Patient Details  Name: Tara Martin MRN: [id number] Date of Birth: 27-Oct-1940  Today's Date: 06/16/2023 OT Individual Time: 1105-1200 OT Individual Time Calculation (min): 55 min    Short Term Goals: Week 1:  OT Short Term Goal 1 (Week 1): Pt will demonstrate improved motor control of RUE to squeeze soap from bottle onto wash cloth. OT Short Term Goal 2 (Week 1): Pt will don a shirt with supervision. OT Short Term Goal 3 (Week 1): Pt will be able to ambulate from bed to toilet with CGA and only 1 cue if needed to find the toilet to demonstrate improved R visual scanning. OT Short Term Goal 4 (Week 1): Pt will be able to self feed with min A needed less than 25 % of the time.  Skilled Therapeutic Interventions/Progress Updates:    Pt received in w/c ready for therapy.      ADL Retraining: -declined need to toilet -pt had new pants on but shirt from yesterday. Taken into bathroom to don bra and new shirt with mod A overall due to perceptual/ coordination deficits -during dressing pt kept repeating "oh boy I need to go clothes shopping"  Therapeutic Activity/ Exercise: -pt taken to gym to work on bilateral coordination and visual scanning exercises. -held up cards with very small pictures 20/10 and pt able to identify small shapes correctly, tried with larger letters and pt could state some letters but not others.  -folding wash cloths into small square. Pt could fold into rectangle but needed cues to make the last fold. Good use of B hands together. -stacking pins and soft Legos with mod cues.   -pt able to place legos together more easily if blocks in L visual field -worked on R scanning with pt having to look far to R field to find blocks needed to build color patterns.  Only min cues to turn her head.  Improved R side scanning.   -pt very agreeable with activities and I always explained the purpose of the activities "yes I really need to work on  my vision".  Balance: -stood with supervision in bathroom to adjust bra  Pt resting in w/c with all needs met. Alarm set and call light in reach.  Telesitter on.   Therapy Documentation Precautions:  Precautions Precautions: Fall Recall of Precautions/Restrictions: Impaired Precaution/Restrictions Comments: Fall, R hemi, R inattention, R visual field neglect Restrictions Weight Bearing Restrictions Per Provider Order: No Pain: Pain Assessment Pain Scale: 0-10 Pain Score: 0-No pain ADL: ADL Eating: Minimal assistance Grooming: Minimal assistance Where Assessed-Grooming: Sitting at sink Upper Body Bathing: Supervision/safety, Moderate cueing Where Assessed-Upper Body Bathing: Shower Lower Body Bathing: Contact guard, Moderate cueing Where Assessed-Lower Body Bathing: Shower Upper Body Dressing: Minimal assistance, Moderate cueing Lower Body Dressing: Minimal assistance, Moderate cueing Toileting: Minimal assistance, Moderate cueing Where Assessed-Toileting: Teacher, adult education: Curator Method: Event organiser: Insurance underwriter Method: Designer, industrial/product: Emergency planning/management officer, Grab bars   Therapy/Group: Co-Treatment  Burwell Bethel 06/16/2023, 8:43 AM

## 2023-06-16 NOTE — Progress Notes (Signed)
 Inpatient Rehabilitation Care Coordinator Assessment and Plan Patient Details  Name: Tara Martin MRN: [id number] Date of Birth: 1940-07-04  Today's Date: 06/16/2023  Hospital Problems: Principal Problem:   Embolic stroke [facility])  Past Medical History:  Past Medical History:  Diagnosis Date   Anxiety state, unspecified - sees Dr. Jennette Kettle in gyn and benzo prescribed there 04/09/2013   Arthritis    Chicken pox    Hx of blood clots    jan 2025   Lupus    MCI (mild cognitive impairment) 05/20/2020   Migraines    Prediabetes    Past Surgical History:  Past Surgical History:  Procedure Laterality Date   BACK SURGERY  2000   BRONCHIAL BRUSHINGS  06/14/2023   Procedure: BRONCHOSCOPY, WITH BRUSH BIOPSY;  Surgeon: Leslye Peer, MD;  Location: [facility] ENDOSCOPY;  Service: Cardiopulmonary;;   BRONCHIAL NEEDLE ASPIRATION BIOPSY  06/14/2023   Procedure: BRONCHOSCOPY, WITH NEEDLE ASPIRATION BIOPSY;  Surgeon: Leslye Peer, MD;  Location: [facility] ENDOSCOPY;  Service: Cardiopulmonary;;   CHOLECYSTECTOMY     DILATION AND CURETTAGE OF UTERUS  1973   GALLBLADDER SURGERY  1974   VIDEO BRONCHOSCOPY WITH ENDOBRONCHIAL ULTRASOUND Bilateral 06/14/2023   Procedure: BRONCHOSCOPY, WITH EBUS;  Surgeon: Leslye Peer, MD;  Location: [facility] ENDOSCOPY;  Service: Cardiopulmonary;  Laterality: Bilateral;   Social History:  reports that she has never smoked. She has never used smokeless tobacco. She reports that she does not currently use alcohol after a past usage of about 2.0 standard drinks of alcohol per week. She reports that she does not use drugs.  Family / Support Systems Marital Status: Married How Long?: 54 years Patient Roles: Spouse Spouse/Significant Other: Leonette Most (husband) Children: Dtr- lives in [place] Other Supports: Friends/church family Anticipated Caregiver: Husband Ability/Limitations of Caregiver: Pt husband is legally blind. He reports their dtr will come down for a few days after her  discharge and will return to [place], [place]. Caregiver Availability: 24/7 Family Dynamics: Pt lives with her husband.  Social History Preferred language: English Religion: Methodist Cultural Background: She worked as a Special educational needs teacher until retirement at age 22 Education: high school grad Primary school teacher - How often do you need to have someone help you when you read instructions, pamphlets, or other written material from your doctor or pharmacy?: Sometimes Writes: No Employment Status: Retired Marine scientist Issues: Husband Denies Guardian/Conservator: N/A   Abuse/Neglect Abuse/Neglect Assessment Can Be Completed: Yes Physical Abuse: Denies Verbal Abuse: Denies Sexual Abuse: Denies Exploitation of patient/patient's resources: Denies Self-Neglect: Denies  Patient response to: Social Isolation - How often do you feel lonely or isolated from those around you?: Patient unable to respond  Emotional Status Pt's affect, behavior and adjustment status: Pt in good spirits at time of visit. she reports she misses her husband. Assessment completed with her husband. Recent Psychosocial Issues: None Psychiatric History: alzheimer'sdiagnosis Substance Abuse History: Husband denies  Patient / Family Perceptions, Expectations & Goals Pt/Family understanding of illness & functional limitations: Pt husband has a general understanding of care needs Premorbid pt/family roles/activities: Independent with supervision needs due to cognition Anticipated changes in roles/activities/participation: Continued assistance with ADLs/IADLs Pt/family expectations/goals: Pt husband would like for pt to be able to perform most of her self-care, feed, and dress self as she was before even though he can help her with this.  Community Resources [employer]: None Premorbid Home Care/DME Agencies: None Transportation available at discharge: TBD Is the patient able to respond to transportation  needs?: No In  the past 12 months, has lack of transportation kept you from medical appointments or from getting medications?: No In the past 12 months, has lack of transportation kept you from meetings, work, or from getting things needed for daily living?: No Resource referrals recommended: Neuropsychology  Discharge Planning Living Arrangements: Spouse/significant other Support Systems: Spouse/significant other, Children Type of Residence: Private residence Insurance Resources: Media planner (specify) Administrator Medicare) Financial Resources: Restaurant manager, fast food Screen Referred: No Living Expenses: Banker Management: Spouse Does the patient have any problems obtaining your medications?: No Home Management: Pt husband manages homecare needs; they have  a cleaning lady that comes in to assist; friends/church family helps with transportation to appointments. Patient/Family Preliminary Plans: No changes Care Coordinator Barriers to Discharge: Decreased caregiver support, Lack of/limited family support, Insurance for SNF coverage Care Coordinator Anticipated Follow Up Needs: HH/OP Expected length of stay: 10-14 days  Clinical Impression SW completed assessment with pt husband. Pt is not a Cytogeneticist. No HCPOA. DME: TTB.  Shantrice Rodenberg A Jing Howatt 06/16/2023, 1:37 PM

## 2023-06-16 NOTE — Discharge Instructions (Addendum)
 Inpatient Rehab Discharge Instructions  Tara Martin Discharge date and time:  06/24/23  Activities/Precautions/ Functional Status: Activity: no lifting, driving, or strenuous exercise till cleared by MD Diet: cardiac diet Wound Care: none needed   Functional status:  ___ No restrictions     ___ Walk up steps independently _X__ 24/7 supervision/assistance   ___ Walk up steps with assistance ___ Intermittent supervision/assistance  ___ Bathe/dress independently ___ Walk with walker     _X__ Bathe/dress with assistance ___ Walk Independently    ___ Shower independently ___ Walk with assistance    ___ Shower with assistance __X_ No alcohol     ___ Return to work/school ________   Special Instructions: Family needs to provide 24 hours (has to be insight of the person) supervision, locks on door, etc to prevent wandering. Family has to handle and administer medications.     STROKE/TIA DISCHARGE INSTRUCTIONS SMOKING Cigarette smoking nearly doubles your risk of having a stroke & is the single most alterable risk factor  If you smoke or have smoked in the last 12 months, you are advised to quit smoking for your health. Most of the excess cardiovascular risk related to smoking disappears within a year of stopping. Ask you doctor about anti-smoking medications [place] Quit Line: 1-800-QUIT NOW Free Smoking Cessation Classes [phone]  CHOLESTEROL Know your levels; limit fat & cholesterol in your diet  Lipid Panel     Component Value Date/Time   CHOL 136 06/09/2023 0422   TRIG 155 (H) 06/09/2023 0422   HDL 23 (L) 06/09/2023 0422   CHOLHDL 5.9 06/09/2023 0422   VLDL 31 06/09/2023 0422   LDLCALC 82 06/09/2023 0422   LDLCALC 129 (H) 12/11/2019 0908     Many patients benefit from treatment even if their cholesterol is at goal. Goal: Total Cholesterol (CHOL) less than 160 Goal:  Triglycerides (TRIG) less than 150 Goal:  HDL greater than 40 Goal:  LDL (LDLCALC) less than 100    BLOOD PRESSURE American Stroke Association blood pressure target is less that 120/80 mm/Hg  Your discharge blood pressure is:  BP: (!) 107/46 Monitor your blood pressure Limit your salt and alcohol intake Many individuals will require more than one medication for high blood pressure  DIABETES (A1c is a blood sugar average for last 3 months) Goal HGBA1c is under 7% (HBGA1c is blood sugar average for last 3 months)  Diabetes: No known diagnosis of diabetes    Lab Results  Component Value Date   HGBA1C 5.5 06/08/2023    Your HGBA1c can be lowered with medications, healthy diet, and exercise. Check your blood sugar as directed by your physician Call your physician if you experience unexplained or low blood sugars.  PHYSICAL ACTIVITY/REHABILITATION Goal is 30 minutes at least 4 days per week  Activity: No driving, Therapies: see above Return to work: N/A Activity decreases your risk of heart attack and stroke and makes your heart stronger.  It helps control your weight and blood pressure; helps you relax and can improve your mood. Participate in a regular exercise program. Talk with your doctor about the best form of exercise for you (dancing, walking, swimming, cycling).  DIET/WEIGHT Goal is to maintain a healthy weight  Your discharge diet is:  Diet Order             Diet regular Room service appropriate? Yes with Assist; Fluid consistency: Thin  Diet effective now  liquids Your height is:  Height: 5\' 5"  (165.1 cm) Your current weight is: Weight: 59.4 kg Your Body Mass Index (BMI) is:  BMI (Calculated): 21.79 Following the type of diet specifically designed for you will help prevent another stroke. You are at goal weight    Your goal Body Mass Index (BMI) is 19-24. Healthy food habits can help reduce 3 risk factors for stroke:  High cholesterol, hypertension, and excess weight.  RESOURCES Stroke/Support Group:  Call [phone]   STROKE EDUCATION  PROVIDED/REVIEWED AND GIVEN TO PATIENT Stroke warning signs and symptoms How to activate emergency medical system (call 911). Medications prescribed at discharge. Need for follow-up after discharge. Personal risk factors for stroke. Pneumonia vaccine given:  Flu vaccine given:  My questions have been answered, the writing is legible, and I understand these instructions.  I will adhere to these goals & educational materials that have been provided to me after my discharge from the hospital.     My questions have been answered and I understand these instructions. I will adhere to these goals and the provided educational materials after my discharge from the hospital.  Patient/Caregiver Signature _______________________________ Date __________  Clinician Signature _______________________________________ Date __________  Please bring this form and your medication list with you to all your follow-up doctor's appointments.

## 2023-06-16 NOTE — Progress Notes (Signed)
 Physical Therapy Session Note  Patient Details  Name: Tara Martin MRN: [id number] Date of Birth: 09/17/1940  Today's Date: 06/16/2023 PT Individual Time: 0930-1030; 1420 - 1505 PT Individual Time Calculation (min): 60 min; 45 min   Short Term Goals: Week 1:  PT Short Term Goal 1 (Week 1): pt will perform stand pivot transfer with LRAD and CGA PT Short Term Goal 2 (Week 1): pt will ambulate 150 feet with LRAD and CGA PT Short Term Goal 3 (Week 1): pt will navigate 5 steps with B HR and CGA  SESSION 1 Skilled Therapeutic Interventions/Progress Updates: Patient sitting in WC on entrance to room. Patient alert and agreeable to PT session.   Patient reported no pain during session.  Therapeutic Activity: Transfers: Pt performed sit to stands from [facility] height with CGA for safety, and VC to retro-step back to sitting surface (when back of knee touches edge). Pt transported to main gym in [facility] dependently for time management/energy conservation.  Patient demonstrates increased fall risk as noted by score of 17/56 on Berg Balance Scale.  (<36= high risk for falls, close to 100%; 37-45 significant >80%; 46-51 moderate >50%; 52-55 lower >25%)  Gait Training:  Pt ambulated roughly 320' using no AD (R HHA on first lap around nsg/day room loop, then no HHA on 2nd lap) with overall minA. Pt demonstrated the following gait deviations with therapist providing the described cuing and facilitation for improvement:  - Decreased step clearance on R LE - slight scissor-like pattern (L LE swinging closer to midline vs R) with VC to increase. Pt started to physically fatigue towards end with increased cuing to increase step clearance - Decreased attention to R  Neuromuscular Re-ed: - Stepping to 6" step with only L LE to increase WB on R LE for proprioceptive feedback. Pt with R HHA initially, then no UE support. Pt with minA and increased R lean with HHA, then closer to light minA without HHA due to  decreased compensation to bear weight to R. Pt cued throughout to maintain balance closer to center. 2 rounds performed.  NMR performed for improvements in motor control and coordination, balance, sequencing, judgement, and self confidence/ efficacy in performing all aspects of mobility at highest level of independence.   Patient sitting in WC at end of session with brakes locked, belt alarm set, and all needs within reach.  SESSION 2 Skilled Therapeutic Interventions/Progress Updates: Patient sitting in [facility] with family present on entrance to room. Patient alert and agreeable to PT session.   Patient reported no pain. Pt, family and PTA discussed pt's CLOF and deficits and what PT encompass to progress pt towards goals. Pt family reported they will have assistance from daughter after d/c for a few days, and is in contact with LCSW about further assistance afterwards.   Gait Training:  Pt ambulated from room<>main gym using no AD with min/light minA. Pt demonstrated the following gait deviations with therapist providing the described cuing and facilitation for improvement:  - Decreased step clearance on R LE with VC to shift weight to L to increase to avoid LOB anteriorly - decreased R sided attention   Pt with improved step clearance on R LE, and weight shift to L when ambulating back to room following NMRE. Pt still required VC throughout to maintain. Pt also with increased cuing to turn head to R to ambulate in that direction (visual cue with PTA cuing pt to follow hand in R direction).    Neuromuscular Re-ed:  NMR facilitated during session with focus on dynamic standing balance, weight shifting, proprioceptive feedback.  - 5lb ankle weight donned on R LE. Pt ambulated around nsg/day room loop x 2 (roughly 320') with min/light minA and multimodal cues to shift weight to L to improve clearance on R. Pt unable to maintain for more than a few steps. Pt with seated rest break. Pt also cued to  increase spatial awareness to environment to avoid coming close to grazing objects.  - 5lb ankle weight donned laterally stepping R LE onto pilate step in order to improve weight shifting onto L. Pt with mod cues to perform shift with multimodal cues. VC added to bring R LE back to neutral stance when stepping off, and to avoid R LE sliding on step.  NMR performed for improvements in motor control and coordination, balance, sequencing, judgement, and self confidence/ efficacy in performing all aspects of mobility at highest level of independence.   Patient sitting in WC at end of session with brakes locked, husband present, belt alarm set, and all needs within reach.       Therapy Documentation Precautions:  Precautions Precautions: Fall Recall of Precautions/Restrictions: Impaired Precaution/Restrictions Comments: Fall, R hemi, R inattention, R visual field neglect Restrictions Weight Bearing Restrictions Per Provider Order: No  Therapy/Group: Individual Therapy  Aracelie Addis PTA 06/16/2023, 12:17 PM

## 2023-06-16 NOTE — Progress Notes (Signed)
 Speech Language Pathology Daily Session Note  Patient Details  Name: Tara Martin MRN: [id number] Date of Birth: [birth year]  Today's Date: 06/16/2023 SLP Individual Time: 6962-9528 SLP Individual Time Calculation (min): 55 min  Short Term Goals: Week 1: SLP Short Term Goal 1 (Week 1): Patient will utilize external memory aids to recall basic orientation information with 75% accuracy given max assist. SLP Short Term Goal 2 (Week 1): Patient will utilize external aids to state date in 4/5 opportunities given max assist. SLP Short Term Goal 3 (Week 1): Patient will utilize external aids/signs to implement basic safety awareness strategies in 4/5 opportunities given max assist. SLP Short Term Goal 4 (Week 1): Patient will demonstrate safety awareness during basic problem solving tasks with 80% accuracy given max assist.  Skilled Therapeutic Interventions: SLP conducted skilled therapy session targeting cognitive retraining goals. Upon SLP entry, patient eating breakfast. SLP assumed role of full supervision for breakfast to provide cognitive assistance. Patient benefited from mod cues to locate items on the right side of the plate/tray. Patient's access to items improved with intermittent rotation of plate to reposition items on the left side of her visual field. Throughout session, patient required total assist to recall basic orientation information such as reason for admission, current location, and year. With max to total cues, patient interpreted daily schedule and calendar to state date, month, year, and upcoming therapy appointments. Patient called husband on room phone to relay schedule with total assist. Patient was left in room with call bell in reach and alarm set. SLP will continue to target goals per plan of care.         Pain Pain Assessment Pain Scale: 0-10 Pain Score: 0-No pain  Therapy/Group: Individual Therapy  Tara Martin, M.A., CCC-SLP  Tara Martin 06/16/2023, 9:01  AM

## 2023-06-16 NOTE — Plan of Care (Signed)
  Problem: RH SAFETY Goal: RH STG ADHERE TO SAFETY PRECAUTIONS W/ASSISTANCE/DEVICE Description: STG Adhere to Safety Precautions With cues Assistance/Device. Outcome: Progressing   Problem: RH PAIN MANAGEMENT Goal: RH STG PAIN MANAGED AT OR BELOW PT'S PAIN GOAL Description: < 4 with prns Outcome: Progressing   Problem: RH KNOWLEDGE DEFICIT Goal: RH STG INCREASE KNOWLEDGE OF DIABETES Description: Patient and dtr will be able to manage prediabetes using educational resources for medications and dietary modification independently Outcome: Progressing

## 2023-06-17 DIAGNOSIS — R278 Other lack of coordination: Secondary | ICD-10-CM | POA: Diagnosis not present

## 2023-06-17 DIAGNOSIS — G301 Alzheimer's disease with late onset: Secondary | ICD-10-CM | POA: Diagnosis not present

## 2023-06-17 DIAGNOSIS — H53461 Homonymous bilateral field defects, right side: Secondary | ICD-10-CM | POA: Diagnosis not present

## 2023-06-17 DIAGNOSIS — I6340 Cerebral infarction due to embolism of unspecified cerebral artery: Secondary | ICD-10-CM | POA: Diagnosis not present

## 2023-06-17 NOTE — Progress Notes (Signed)
 PROGRESS NOTE   Subjective/Complaints:  Notified by pulmonary that bronchoscopy cytology revealed adenocarcinoma of lung   ROS-negative chest pain shortness of breath nausea vomiting diarrhea constipation  Objective:   No results found. Recent Labs    06/15/23 0518  WBC 5.3  HGB 9.6*  HCT 30.5*  PLT 262   Recent Labs    06/15/23 0518  NA 136  K 4.4  CL 106  CO2 20*  GLUCOSE 126*  BUN 17  CREATININE 0.90  CALCIUM 9.1    Intake/Output Summary (Last 24 hours) at 06/17/2023 0827 Last data filed at 06/16/2023 1700 Gross per 24 hour  Intake 414 ml  Output --  Net 414 ml        Physical Exam: Vital Signs Blood pressure (!) 142/67, pulse 64, temperature 98.3 F (36.8 C), temperature source Oral, resp. rate 16, height 5\' 5"  (1.651 m), weight 59.4 kg, SpO2 95%.   General: No acute distress Mood and affect mildly irritable when asked orientation questions. Heart: Regular rate and rhythm no rubs murmurs or extra sounds Lungs: Clear to auscultation, breathing unlabored, no rales or wheezes Abdomen: Positive bowel sounds, soft nontender to palpation, nondistended Extremities: No clubbing, cyanosis, or edema Skin: No evidence of breakdown, no evidence of rash Neurologic: Cranial nerves II through XII intact, motor strength is 5/5 in bilateral deltoid, bicep, tricep, grip, hip flexor, knee extensors, ankle dorsiflexor and plantar flexor Mild dysmetria RIght FNF and Right Heel --> shin Right homonymous hemianopsia-dense  Reduced sensation RIght foot LT Oriented to person and place but not time  Musculoskeletal: Full range of motion in all 4 extremities. No joint swelling   Assessment/Plan: 1. Functional deficits which require 3+ hours per day of interdisciplinary therapy in a comprehensive inpatient rehab setting. Physiatrist is providing close team supervision and 24 hour management of active medical problems  listed below. Physiatrist and rehab team continue to assess barriers to discharge/monitor patient progress toward functional and medical goals  Care Tool:  Bathing    Body parts bathed by patient: Right arm, Left arm, Chest, Abdomen, Right upper leg, Buttocks, Front perineal area, Left upper leg, Right lower leg, Left lower leg, Face         Bathing assist Assist Level: Contact Guard/Touching assist     Upper Body Dressing/Undressing Upper body dressing   What is the patient wearing?: Pull over shirt    Upper body assist Assist Level: Minimal Assistance - Patient > 75%    Lower Body Dressing/Undressing Lower body dressing      What is the patient wearing?: Underwear/pull up, Pants     Lower body assist Assist for lower body dressing: Minimal Assistance - Patient > 75%     Toileting Toileting    Toileting assist Assist for toileting: Moderate Assistance - Patient 50 - 74%     Transfers Chair/bed transfer  Transfers assist     Chair/bed transfer assist level: Minimal Assistance - Patient > 75%     Locomotion Ambulation   Ambulation assist      Assist level: Minimal Assistance - Patient > 75% Assistive device: No Device Max distance: 300+   Walk 10 feet activity  Assist     Assist level: Minimal Assistance - Patient > 75% Assistive device: No Device, Hand held assist   Walk 50 feet activity   Assist    Assist level: Minimal Assistance - Patient > 75% Assistive device: No Device, Hand held assist    Walk 150 feet activity   Assist    Assist level: Minimal Assistance - Patient > 75% Assistive device: No Device, Hand held assist    Walk 10 feet on uneven surface  activity   Assist     Assist level: Minimal Assistance - Patient > 75% Assistive device: Hand held assist   Wheelchair     Assist Is the patient using a wheelchair?: Yes      Wheelchair assist level: Dependent - Patient 0%      Wheelchair 50 feet with 2  turns activity    Assist        Assist Level: Dependent - Patient 0%   Wheelchair 150 feet activity     Assist      Assist Level: Dependent - Patient 0%   Blood pressure (!) 142/67, pulse 64, temperature 98.3 F (36.8 C), temperature source Oral, resp. rate 16, height 5\' 5"  (1.651 m), weight 59.4 kg, SpO2 95%.  Medical Problem List and Plan: 1. 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  Right homonymous hemianopsia and sensory ataxia UE and LE              -patient may  shower-              -ELOS/Goals: 12-18 days supervision due to poor memory- could not set d/c date yet in team conf             CIR PT, OT, SLP  2.  Antithrombotics: -DVT/anticoagulation:  Pharmaceutical: Lovenox             -antiplatelet therapy: ASA 3. Pain Management: Tylenol prn for pain.  4. Mood/Behavior/Sleep: LCSW to follow for evaluation and support.              -antipsychotic agents: N/A 5. Neuropsych/cognition: This patient is not fully capable of making decisions on of own behalf. 6. Skin/Wound Care: Routine pressure relief measures.  7. Fluids/Electrolytes/Nutrition: Monitor intake. Check CMET in am.  8. PE/DVT: To start treatment dose of lovenox in am- R>L PE onset in January 2025 9. Moderate degree dementia: just dx'd Advancement past PE/hypoxia per 05/2023 visit.  --Now back on Aricept.  Recent memory impaired orientation impaired, mild irritability but no agitation thus far. 10. Bronch/Biopsy of 4.4 x 2+ cm R middle lung nodule that's suspicious for cancer, cytology revealed adenocarcinoma, await pulmonary medicine recs  11. HLD- con't statin    LOS: 3 days A FACE TO FACE EVALUATION WAS PERFORMED  Erick Colace 06/17/2023, 8:27 AM

## 2023-06-17 NOTE — Progress Notes (Signed)
 Occupational Therapy Session Note  Patient Details  Name: Tara Martin MRN: [id number] Date of Birth: 1940/06/03  Today's Date: 06/17/2023 OT Individual Time: 8295-6213 OT Individual Time Calculation (min): 75 min    Short Term Goals: Week 1:  OT Short Term Goal 1 (Week 1): Pt will demonstrate improved motor control of RUE to squeeze soap from bottle onto wash cloth. OT Short Term Goal 2 (Week 1): Pt will don a shirt with supervision. OT Short Term Goal 3 (Week 1): Pt will be able to ambulate from bed to toilet with CGA and only 1 cue if needed to find the toilet to demonstrate improved R visual scanning. OT Short Term Goal 4 (Week 1): Pt will be able to self feed with min A needed less than 25 % of the time.  Skilled Therapeutic Interventions/Progress Updates:    Pt received in bed and agreeable to  a shower. Had pt ambulate in room with HHA on her R and CGA to walk to sink to brush teeth, then to toilet (continent and documented in flow sheet),  shower, dressed sitting on BSC.   Overall she needs min A and mod cues due to R neglect, R hemianopia, severely imp STM, limited problem solving.  Pt asking every other minute "I hope my husband is ok, does he know I am here". Each time I would reassure her and let her know we can call him at the end of the session. Pt sat to dry her hair with hairdryer using B hands well. Improved B hand integration today.  Pt also scanning to the right more easily.   Pt resting in w/c at end of session with belt alarm on.  I assisted pt dialing the phone to talk to her spouse.   Therapy Documentation Precautions:  Precautions Precautions: Fall Recall of Precautions/Restrictions: Impaired Precaution/Restrictions Comments: Fall, R hemi, R inattention, R visual field neglect Restrictions Weight Bearing Restrictions Per Provider Order: No   Pain: Pain Assessment Pain Scale: 0-10 Pain Score: 0-No pain ADL: ADL Eating: Minimal assistance Grooming: Minimal  assistance Where Assessed-Grooming: Sitting at sink Upper Body Bathing: Supervision/safety, Moderate cueing Where Assessed-Upper Body Bathing: Shower Lower Body Bathing: Contact guard, Moderate cueing Where Assessed-Lower Body Bathing: Shower Upper Body Dressing: Minimal assistance, Moderate cueing Lower Body Dressing: Minimal assistance, Moderate cueing Toileting: Minimal assistance, Moderate cueing Where Assessed-Toileting: Teacher, adult education: Curator Method: Event organiser: Insurance underwriter Method: Designer, industrial/product: Emergency planning/management officer, Grab bars   Therapy/Group: Individual Therapy  Thanos Cousineau 06/17/2023, 12:55 PM

## 2023-06-17 NOTE — IPOC Note (Signed)
 Overall Plan of Care [facility]) Patient Details Name: Tara Martin MRN: [id number] DOB: December 28, 1940  Admitting Diagnosis: Embolic stroke [facility])  Hospital Problems: Principal Problem:   Embolic stroke [facility])     Functional Problem List: Nursing Pain, Safety, Endurance, Medication Management  PT Balance, Behavior, Edema, Endurance, Motor, Perception, Safety, Sensory, Skin Integrity  OT Balance, Cognition, Behavior, Motor, Endurance, Sensory, Safety, Perception, Vision  SLP Cognition  TR         Basic ADL's: OT Eating, Grooming, Bathing, Toileting     Advanced  ADL's: OT       Transfers: PT Bed Mobility, Bed to Chair, Car, Occupational psychologist, Research scientist (life sciences): PT Ambulation, Psychologist, prison and probation services, Stairs     Additional Impairments: OT Fuctional Use of Upper Extremity  SLP Social Cognition   Problem Solving, Memory, Attention, Awareness  TR      Anticipated Outcomes Item Anticipated Outcome  Self Feeding supervision  Swallowing      Basic self-care  supervision  Toileting  supervision   Bathroom Transfers supervision  Bowel/Bladder  n/a  Transfers  supervision  Locomotion  supervision  Communication     Cognition  mod A  Pain  manage < 4 with prns  Safety/Judgment  manage safety w cues   Therapy Plan: PT Intensity: Minimum of 1-2 x/day ,45 to 90 minutes PT Frequency: 5 out of 7 days PT Duration Estimated Length of Stay: 10-14 days OT Intensity: Minimum of 1-2 x/day, 45 to 90 minutes OT Frequency: 5 out of 7 days OT Duration/Estimated Length of Stay: 10-12 days SLP Intensity: Minumum of 1-2 x/day, 30 to 90 minutes SLP Frequency: 3 to 5 out of 7 days SLP Duration/Estimated Length of Stay: 10-14 days   Team Interventions: Nursing Interventions Patient/Family Education, Medication Management, Bowel Management, Disease Management/Prevention, Discharge Planning, Dysphagia/Aspiration Precaution Training  PT interventions Ambulation/gait  training, Community reintegration, DME/adaptive equipment instruction, Neuromuscular re-education, Psychosocial support, Stair training, UE/LE Strength taining/ROM, Wheelchair propulsion/positioning, Warden/ranger, Discharge planning, Functional electrical stimulation, Pain management, Skin care/wound management, Therapeutic Activities, UE/LE Coordination activities, Cognitive remediation/compensation, Disease management/prevention, Functional mobility training, Patient/family education, Splinting/orthotics, Therapeutic Exercise, Visual/perceptual remediation/compensation  OT Interventions Balance/vestibular training, Cognitive remediation/compensation, Discharge planning, DME/adaptive equipment instruction, Functional mobility training, Neuromuscular re-education, Pain management, Patient/family education, Psychosocial support, Self Care/advanced ADL retraining, Therapeutic Activities, Therapeutic Exercise, UE/LE Strength taining/ROM, UE/LE Coordination activities, Visual/perceptual remediation/compensation  SLP Interventions Cognitive remediation/compensation, Environmental controls, Cueing hierarchy, Functional tasks, Therapeutic Activities, Internal/external aids  TR Interventions    SW/CM Interventions Psychosocial Support, Patient/Family Education, Discharge Planning   Barriers to Discharge MD  Medical stability and Pending chemo/radiation  Nursing Decreased caregiver support, Home environment access/layout 2 level main B+B w spouse who is legally blind;3 on the side and 5 in the front one rail on side with brick wall and bil rails in front  PT Decreased caregiver support, Home environment access/layout, Incontinence, Lack of/limited family support baseline dementia  OT      SLP      SW Decreased caregiver support, Lack of/limited family support, Community education officer for SNF coverage     Team Discharge Planning: Destination: PT-Home ,OT- Home , SLP-Home Projected Follow-up: PT-Home  health PT, OT-  Home health OT, 24 hour supervision/assistance, SLP-Home Health SLP, 24 hour supervision/assistance Projected Equipment Needs: PT-To be determined, OT- To be determined, SLP-None recommended by SLP Equipment Details: PT- , OT-  Patient/family involved in discharge planning: PT- Patient unable/family or caregiver not available,  OT-Patient, SLP-Patient, Family member/caregiver  MD ELOS: 10-14d Medical Rehab Prognosis:  Good Assessment: The patient has been admitted for CIR therapies with the diagnosis of embolic stroke . The team will be addressing functional mobility, strength, stamina, balance, safety, adaptive techniques and equipment, self-care, bowel and bladder mgt, patient and caregiver education, adenocarcinoma of lung with hypercoag state. Goals have been set at Sup. Anticipated discharge destination is Home with husband .        See Team Conference Notes for weekly updates to the plan of care

## 2023-06-17 NOTE — Progress Notes (Signed)
 Physical Therapy Session Note  Patient Details  Name: Tara Martin MRN: [id number] Date of Birth: 1941/02/06  Today's Date: 06/17/2023 PT Individual Time: 1355-1505 PT Individual Time Calculation (min): 70 min   Short Term Goals: Week 1:  PT Short Term Goal 1 (Week 1): pt will perform stand pivot transfer with LRAD and CGA PT Short Term Goal 2 (Week 1): pt will ambulate 150 feet with LRAD and CGA PT Short Term Goal 3 (Week 1): pt will navigate 5 steps with B HR and CGA  Skilled Therapeutic Interventions/Progress Updates: Patient sitting in [facility] with husband present on entrance to room. Patient alert and agreeable to PT session.   Patient with no complaints of pain during session.  Pt required consistent redirecting of tasks performed, or recalling what pt was cued to do throughout session. Pt demonstrates poor spatial awareness, often bumping into objects, or attempting to walk through barriers such as chairs to get to specified destination. Session focused on NMRE in bodily repetition (weight shifting) as pt's memory recall is impacted by past Alzheimer's diagnosis  Gait Training:  Pt ambulated throughout session using no AD with CGA/light minA. Pt with decrease L weight shift leading to decreased step clearance on R LE.   Neuromuscular Re-ed: NMR facilitated during session with focus on weight shifting, proprioceptive feedback, spatial awareness. - NuStep level 3 with cues to maintain above 50 spm (avg 58 spm) for 13 min. Pt performed to increase resistance to R LE for proprioceptive feedback, and to recall cue of maintaining spm (min cuing for pt to recall when pt asked).  - Pt ambulated around day room/nsg loop with pt holding tidal tank on L LE in order to promote weight shift to L side (pt required use of toilet to void bladder and ambulated back to room with tidal tank with female briefly assisting in bathroom for modesty). Pt ambulated from room<day room, then around nsg loop with  tidal tank with noted improvement of weight shift to L with R LE catching floor roughly 50% of time (more times caught when starting to fatigue with seated rest break provided). - Pt ambulated around nsg/day room loop following tidal tank intervention with pt still requiring mod cuing to increase step clearance on R LE (not as much of R LE catching floor vs initially ambulating at beginning of session, but still present)   NMR performed for improvements in motor control and coordination, balance, sequencing, judgement, and self confidence/ efficacy in performing all aspects of mobility at highest level of independence.   Patient sitting  in WC at end of session with brakes locked, husband present, belt alarm set, and all needs within reach.      Therapy Documentation Precautions:  Precautions Precautions: Fall Recall of Precautions/Restrictions: Impaired Precaution/Restrictions Comments: Fall, R hemi, R inattention, R visual field neglect Restrictions Weight Bearing Restrictions Per Provider Order: No  Therapy/Group: Individual Therapy  Tnya Ades PTA 06/17/2023, 3:33 PM

## 2023-06-17 NOTE — Progress Notes (Signed)
 Patient has been calm and cooperative this shift. We called her husband around 2100 last night and she was able to talk to him on the phone. She was assisted to the bathroom twice to void. She did report she felt like she needed to have BM but did not have one when up to the commode. Telesitter in room. Pt did try to get out of bed once this shift and was easily redirected with verbal prompts. No concerns at this time.

## 2023-06-17 NOTE — Progress Notes (Signed)
 Speech Language Pathology Daily Session Note  Patient Details  Name: Tara Martin MRN: [id number] Date of Birth: 30-Sep-1940  Today's Date: 06/17/2023 SLP Individual Time: 0812-0912 SLP Individual Time Calculation (min): 60 min  Short Term Goals: Week 1: SLP Short Term Goal 1 (Week 1): Patient will utilize external memory aids to recall basic orientation information with 75% accuracy given max assist. SLP Short Term Goal 2 (Week 1): Patient will utilize external aids to state date in 4/5 opportunities given max assist. SLP Short Term Goal 3 (Week 1): Patient will utilize external aids/signs to implement basic safety awareness strategies in 4/5 opportunities given max assist. SLP Short Term Goal 4 (Week 1): Patient will demonstrate safety awareness during basic problem solving tasks with 80% accuracy given max assist.  Skilled Therapeutic Interventions: SLP conducted skilled therapy session targeting cognitive retraining goals. Patient pleasant and agreeable to all therapy tasks. SLP facilitated schedule management task and targeted basic recall of orientation information utilizing external visual aids. Patient demonstrates marginal improvements in the areas of right inattention but continues to benefit from total verbal and visual cues to locate items on the right side during functional tasks. Benefited from total cues to interpret daily schedule and identify date from calendar. When MD entered, patient recalled location and purpose of admission after ~1 minute distracted delay but unable to recall current date or date of stroke despite education from SLP prior to MD entrance. During sorting task, patient benefited from total verbal cues for working memory fading to light max assist towards end of task. At end of session, patient requested to use the bathroom thus transferred to bedside commode using rolling walker and mod verbal cues for right inattention. Continent of bowel and bladder. Patient  was left in room with call bell in reach and alarm set. SLP will continue to target goals per plan of care.       Pain Pain Assessment Pain Scale: 0-10 Pain Score: 0-No pain  Therapy/Group: Individual Therapy  Jeannie Done, M.A., CCC-SLP  Yetta Barre 06/17/2023, 9:26 AM

## 2023-06-18 DIAGNOSIS — K5901 Slow transit constipation: Secondary | ICD-10-CM | POA: Diagnosis not present

## 2023-06-18 DIAGNOSIS — D649 Anemia, unspecified: Secondary | ICD-10-CM | POA: Diagnosis not present

## 2023-06-18 DIAGNOSIS — I6340 Cerebral infarction due to embolism of unspecified cerebral artery: Secondary | ICD-10-CM | POA: Diagnosis not present

## 2023-06-18 MED ORDER — POLYETHYLENE GLYCOL 3350 17 G PO PACK
17.0000 g | PACK | Freq: Every day | ORAL | Status: DC
  Administered 2023-06-18 – 2023-06-24 (×7): 17 g via ORAL
  Filled 2023-06-18 (×7): qty 1

## 2023-06-18 MED ORDER — DOCUSATE SODIUM 100 MG PO CAPS
100.0000 mg | ORAL_CAPSULE | Freq: Two times a day (BID) | ORAL | Status: DC
  Administered 2023-06-18 – 2023-06-20 (×6): 100 mg via ORAL
  Filled 2023-06-18 (×6): qty 1

## 2023-06-18 NOTE — Progress Notes (Signed)
 Physical Therapy Session Note  Patient Details  Name: Tara Martin MRN: [id number] Date of Birth: April 03, 1940  Today's Date: 06/18/2023 PT Individual Time: 1052-1202 PT Individual Time Calculation (min): 70 min   Short Term Goals: Week 1:  PT Short Term Goal 1 (Week 1): pt will perform stand pivot transfer with LRAD and CGA PT Short Term Goal 2 (Week 1): pt will ambulate 150 feet with LRAD and CGA PT Short Term Goal 3 (Week 1): pt will navigate 5 steps with B HR and CGA  Skilled Therapeutic Interventions/Progress Updates: Patient sitting in WC on entrance to room. Patient alert and agreeable to PT session.   Patient reported no pain. Pt stated desire to talk to husband. PTA assisted pt calling husband on room phone with brief conversation. Pt required consistent recall throughout session of questions already answered.   Gait Training:  Pt ambulated throughout rehab floor with no AD and with CGA/light minA occasionally due to R lean and decreased weight shift to L. Pt with notable decrease in R foot dragging to floor (slight increase in weight shift to L).  Stair Navigation: - Ascended/descended 8 (6") steps with BHR and CGA throughout. Pt with reciprocal step pattern and able to determine depths of steps without LOB.   Neuromuscular Re-ed: NMR facilitated during session with focus on dynamic standing balance, coordination, weight shift, proprioceptive feedback, spatial awareness. - Tossing bean bags to board with affected UE (R). Pt provided cues to improve coordination and distance of bags tossed by extending R UE behind hips. Pt demonstrated decreased spatial awareness by attempting to pick up bags on R (on table), and requiring increased time to grasp (would grab around bag). 4 rounds performed with pt improving coordination each time. CGA throughout.  - Pt ambulated around main gym hallway with cues to locate discs randomly placed along walls. Pt required increased time/effort to  locate discs with mod cuing. Pt would locate items that were similar in shape (circles on walls) with cues to stay on task. Pt ambulated with CGA/light minA due to decreased spatial awareness,  R sided inattention, lean to R.  - Ambulating 200'+ with 6lb medicine ball in pillow case. Pt holding with L UE in order to increase weight shift to L for gait carry over. Pt with VC to also increase step length. Pt with notable increase to L weight shift. Seated rest break, then ambulated back to 4W around nsg/day room loop with case in hand. Seated rest break followed. Pt then ambulated from day room<room with CGA and with continued improvement in weight shift to L.  NMR performed for improvements in motor control and coordination, balance, sequencing, judgement, and self confidence/ efficacy in performing all aspects of mobility at highest level of independence.   Patient sitting in WC at end of session with brakes locked, belt alarm set, and all needs within reach.      Therapy Documentation Precautions:  Precautions Precautions: Fall Recall of Precautions/Restrictions: Impaired Precaution/Restrictions Comments: Fall, R hemi, R inattention, R visual field neglect Restrictions Weight Bearing Restrictions Per Provider Order: No  Therapy/Group: Individual Therapy  Alexsis Kathman PTA 06/18/2023, 12:17 PM

## 2023-06-18 NOTE — Progress Notes (Signed)
 Speech Language Pathology Daily Session Note  Patient Details  Name: Tara Martin MRN: [id number] Date of Birth: 03/26/1940  Today's Date: 06/18/2023 SLP Individual Time: 1300-1345 SLP Individual Time Calculation (min): 45 min  Short Term Goals: Week 1: SLP Short Term Goal 1 (Week 1): Patient will utilize external memory aids to recall basic orientation information with 75% accuracy given max assist. SLP Short Term Goal 2 (Week 1): Patient will utilize external aids to state date in 4/5 opportunities given max assist. SLP Short Term Goal 3 (Week 1): Patient will utilize external aids/signs to implement basic safety awareness strategies in 4/5 opportunities given max assist. SLP Short Term Goal 4 (Week 1): Patient will demonstrate safety awareness during basic problem solving tasks with 80% accuracy given max assist.  Skilled Therapeutic Interventions:   Pt greeted at bedside. She was sitting EOB and eager for SLP arrival. She was pleasant throughout tx tasks targeting cognition. She benefited from modA to utilize the calendar to provide orientation information. She required maxA for sustained attention and R visual inattention during card sorting task fo 3. During a verbal/visual problem solving task ID unsafe situations and potential solutions, she benefited from modA to ID the problem and maxA to provide potential solution. She did present w/ some word finding deficits throughout safety awareness task, but utilized  circumlocution to compensate majority of the time. At the end of tx tasks, she required gentle re orientation to remain in bed. Errorless learning also utilized to assist w/ remote navigation. Once settled, she was left with the bed alarm on and call light within reach. Recommend cont ST per POC.   Pain  No pain reported  Therapy/Group: Individual Therapy  Rozell Cornet 06/18/2023, 1:34 PM

## 2023-06-18 NOTE — Plan of Care (Signed)
  Problem: Consults Goal: RH STROKE PATIENT EDUCATION Description: See Patient Education module for education specifics  Outcome: Progressing   Problem: RH SAFETY Goal: RH STG ADHERE TO SAFETY PRECAUTIONS W/ASSISTANCE/DEVICE Description: STG Adhere to Safety Precautions With cues Assistance/Device. Outcome: Progressing   Problem: RH PAIN MANAGEMENT Goal: RH STG PAIN MANAGED AT OR BELOW PT'S PAIN GOAL Description: < 4 with prns Outcome: Progressing   Problem: RH KNOWLEDGE DEFICIT Goal: RH STG INCREASE KNOWLEDGE OF DIABETES Description: Patient and dtr will be able to manage prediabetes using educational resources for medications and dietary modification independently Outcome: Progressing   Problem: RH KNOWLEDGE DEFICIT Goal: RH STG INCREASE KNOWLEGDE OF HYPERLIPIDEMIA Description: Patient and dtr will be able to manage HLD using educational resources for medications and dietary modification independently Outcome: Progressing

## 2023-06-18 NOTE — Progress Notes (Signed)
 PROGRESS NOTE   Subjective/Complaints:  Pt doing ok, slept well, denies pain, unsure of LBM but looks like it might have been on 4/8? Urinating ok per pt. No other complaints or concerns.   ROS-negative chest pain, shortness of breath, abd pain, nausea, vomiting, diarrhea  Objective:   No results found. No results for input(s): "WBC", "HGB", "HCT", "PLT" in the last 72 hours.  No results for input(s): "NA", "K", "CL", "CO2", "GLUCOSE", "BUN", "CREATININE", "CALCIUM" in the last 72 hours.   Intake/Output Summary (Last 24 hours) at 06/18/2023 1201 Last data filed at 06/18/2023 0835 Gross per 24 hour  Intake 660 ml  Output --  Net 660 ml        Physical Exam: Vital Signs Blood pressure 114/65, pulse 67, temperature (!) 97.4 F (36.3 C), temperature source Oral, resp. rate 18, height 5\' 5"  (1.651 m), weight 59.4 kg, SpO2 98%.   General: No acute distress, sitting up in w/c Mood and affect appropriate today Heart: Regular rate and rhythm no rubs murmurs or extra sounds Lungs: Clear to auscultation, breathing unlabored, no rales or wheezes Abdomen: Positive bowel sounds, soft nontender to palpation, nondistended Extremities: No clubbing, cyanosis, or edema Skin: No evidence of breakdown, no evidence of rash over exposed surfaces  PRIOR EXAMS: Neurologic: Cranial nerves II through XII intact, motor strength is 5/5 in bilateral deltoid, bicep, tricep, grip, hip flexor, knee extensors, ankle dorsiflexor and plantar flexor Mild dysmetria RIght FNF and Right Heel --> shin Right homonymous hemianopsia-dense  Reduced sensation RIght foot LT Oriented to person and place but not time  Musculoskeletal: Full range of motion in all 4 extremities. No joint swelling   Assessment/Plan: 1. Functional deficits which require 3+ hours per day of interdisciplinary therapy in a comprehensive inpatient rehab setting. Physiatrist is  providing close team supervision and 24 hour management of active medical problems listed below. Physiatrist and rehab team continue to assess barriers to discharge/monitor patient progress toward functional and medical goals  Care Tool:  Bathing    Body parts bathed by patient: Right arm, Left arm, Chest, Abdomen, Right upper leg, Buttocks, Front perineal area, Left upper leg, Right lower leg, Left lower leg, Face         Bathing assist Assist Level: Contact Guard/Touching assist     Upper Body Dressing/Undressing Upper body dressing   What is the patient wearing?: Pull over shirt    Upper body assist Assist Level: Minimal Assistance - Patient > 75%    Lower Body Dressing/Undressing Lower body dressing      What is the patient wearing?: Underwear/pull up, Pants     Lower body assist Assist for lower body dressing: Minimal Assistance - Patient > 75%     Toileting Toileting    Toileting assist Assist for toileting: Contact Guard/Touching assist     Transfers Chair/bed transfer  Transfers assist     Chair/bed transfer assist level: Minimal Assistance - Patient > 75%     Locomotion Ambulation   Ambulation assist      Assist level: Minimal Assistance - Patient > 75% Assistive device: No Device Max distance: 300+   Walk 10 feet activity   Assist  Assist level: Minimal Assistance - Patient > 75% Assistive device: No Device, Hand held assist   Walk 50 feet activity   Assist    Assist level: Minimal Assistance - Patient > 75% Assistive device: No Device, Hand held assist    Walk 150 feet activity   Assist    Assist level: Minimal Assistance - Patient > 75% Assistive device: No Device, Hand held assist    Walk 10 feet on uneven surface  activity   Assist     Assist level: Minimal Assistance - Patient > 75% Assistive device: Hand held assist   Wheelchair     Assist Is the patient using a wheelchair?: Yes Type of Wheelchair:  Manual    Wheelchair assist level: Dependent - Patient 0%      Wheelchair 50 feet with 2 turns activity    Assist        Assist Level: Dependent - Patient 0%   Wheelchair 150 feet activity     Assist      Assist Level: Dependent - Patient 0%   Blood pressure 114/65, pulse 67, temperature (!) 97.4 F (36.3 C), temperature source Oral, resp. rate 18, height 5\' 5"  (1.651 m), weight 59.4 kg, SpO2 98%.  Medical Problem List and Plan: 1. 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  Right homonymous hemianopsia and sensory ataxia UE and LE              -patient may  shower-             -ELOS/Goals: 12-18 days supervision due to poor memory- could not set d/c date yet in team conf             CIR PT, OT, SLP  2.  Antithrombotics: -DVT/anticoagulation:  Pharmaceutical: Lovenox 60mg  BID             -antiplatelet therapy: ASA 81mg  daily 3. Pain Management: Tylenol prn for pain.  4. Mood/Behavior/Sleep: LCSW to follow for evaluation and support.              -antipsychotic agents: N/A  -melatonin PRN, xanax 0.5mg  daily PRN 5. Neuropsych/cognition: This patient is not fully capable of making decisions on of own behalf. 6. Skin/Wound Care: Routine pressure relief measures.  7. Fluids/Electrolytes/Nutrition: Monitor intake. Routine labs, continue supplements 8. PE/DVT: To start treatment dose of lovenox in am- R>L PE onset in January 2025 9. Moderate degree dementia: just dx'd Advancement past PE/hypoxia per 05/2023 visit.  --Now back on Aricept 10mg  QHS -Recent memory impaired orientation impaired, mild irritability but no agitation thus far. 10. Bronch/Biopsy of 4.4 x 2+ cm R middle lung nodule that's suspicious for cancer, cytology revealed adenocarcinoma, await pulmonary medicine recs  11. HLD- con't atorvastatin 20mg  daily 12. Constipation:  -06/18/23 no BM documented since 4/8, will start  on colace BID and miralax daily 13. GERD/GI ppx: protonix 40mg  daily 14. Anemia:   -06/18/23 Hgb stable this past week, 9.6 on 4/9. Monitor.   LOS: 4 days A FACE TO FACE EVALUATION WAS PERFORMED  [address] 06/18/2023, 12:01 PM

## 2023-06-18 NOTE — Progress Notes (Signed)
 Occupational Therapy Session Note  Patient Details  Name: Tara Martin MRN: [id number] Date of Birth: 05/07/1940  Today's Date: 06/18/2023 OT Individual Time: 4098-1191 OT Individual Time Calculation (min): 75 min    Short Term Goals: Week 1:  OT Short Term Goal 1 (Week 1): Pt will demonstrate improved motor control of RUE to squeeze soap from bottle onto wash cloth. OT Short Term Goal 2 (Week 1): Pt will don a shirt with supervision. OT Short Term Goal 3 (Week 1): Pt will be able to ambulate from bed to toilet with CGA and only 1 cue if needed to find the toilet to demonstrate improved R visual scanning. OT Short Term Goal 4 (Week 1): Pt will be able to self feed with min A needed less than 25 % of the time.  Skilled Therapeutic Interventions/Progress Updates:    Patient at bed LOF at the time of arrival. Patient was eating breakfast and in bed and agreed to placement at w/c LOF for better positioning. The pt was able to come from supine in bed to EOB with CGA.  The pt was able to come from sit to stand using the bed and bed rail with SBA, The pt's meal item were positioned with visual challenges in mind to improve upon her Ind. The pt went on to complete BADL related task in bathing and dressing in preparation for her day. The pt indicated that she slept well and that she had no pain to report. Patient was transported to the shower using the w/c, patient able to doff UB clothing items with MinA, inclusive of her bra and button up shirt. The pt required ModA with LB garments, brief and pants. The pt was able to remove her socks and shoes with SBA. The pt was able to enter the shower using the arm of the w/c and the grab bars for entering the shower. The pt was able to bathe her UB with s/u A and heavy cues. The pt was able to bathe her LB with MinA. The pt was able to donn her bra and over head shirt with MinA secondary to BUE IV lines  The pt was ModA for donn her brief and pants.  The pt was  able to donn her shoes with MinA. The pt was transported to the sink area and was able to brush her teeth and comb her hair with s/uA and vc's.  The pt remained at w/c LOF with her safety belt in place and all additional needs addressed prior to exiting the room.   Therapy Documentation Precautions:  Precautions Precautions: Fall Recall of Precautions/Restrictions: Impaired Precaution/Restrictions Comments: Fall, R hemi, R inattention, R visual field neglect Restrictions Weight Bearing Restrictions Per Provider Order: No  Therapy/Group: Individual Therapy  Moises Ang 06/18/2023, 4:12 PM

## 2023-06-19 DIAGNOSIS — D649 Anemia, unspecified: Secondary | ICD-10-CM | POA: Diagnosis not present

## 2023-06-19 DIAGNOSIS — I6340 Cerebral infarction due to embolism of unspecified cerebral artery: Secondary | ICD-10-CM | POA: Diagnosis not present

## 2023-06-19 DIAGNOSIS — K5901 Slow transit constipation: Secondary | ICD-10-CM | POA: Diagnosis not present

## 2023-06-19 NOTE — Progress Notes (Signed)
 PROGRESS NOTE   Subjective/Complaints:  Pt doing well today, slept well, denies pain, unsure of LBM but documented as having one yesterday. Urinating ok per pt. No other complaints or concerns.   ROS-negative chest pain, shortness of breath, abd pain, nausea, vomiting, diarrhea  Objective:   No results found. No results for input(s): "WBC", "HGB", "HCT", "PLT" in the last 72 hours.  No results for input(s): "NA", "K", "CL", "CO2", "GLUCOSE", "BUN", "CREATININE", "CALCIUM" in the last 72 hours.   Intake/Output Summary (Last 24 hours) at 06/19/2023 0956 Last data filed at 06/19/2023 0917 Gross per 24 hour  Intake 996 ml  Output --  Net 996 ml        Physical Exam: Vital Signs Blood pressure (!) 144/74, pulse 70, temperature (!) 97 F (36.1 C), temperature source Oral, resp. rate 18, height 5\' 5"  (1.651 m), weight 59.4 kg, SpO2 97%.   General: No acute distress, resting in bed Mood and affect appropriate today Heart: Regular rate and rhythm no rubs murmurs or extra sounds Lungs: Clear to auscultation, breathing unlabored, no rales or wheezes Abdomen: Positive bowel sounds, soft nontender to palpation, nondistended Extremities: No clubbing, cyanosis, or edema Skin: No evidence of breakdown, no evidence of rash over exposed surfaces  PRIOR EXAMS: Neurologic: Cranial nerves II through XII intact, motor strength is 5/5 in bilateral deltoid, bicep, tricep, grip, hip flexor, knee extensors, ankle dorsiflexor and plantar flexor Mild dysmetria RIght FNF and Right Heel --> shin Right homonymous hemianopsia-dense  Reduced sensation RIght foot LT Oriented to person and place but not time  Musculoskeletal: Full range of motion in all 4 extremities. No joint swelling   Assessment/Plan: 1. Functional deficits which require 3+ hours per day of interdisciplinary therapy in a comprehensive inpatient rehab setting. Physiatrist is  providing close team supervision and 24 hour management of active medical problems listed below. Physiatrist and rehab team continue to assess barriers to discharge/monitor patient progress toward functional and medical goals  Care Tool:  Bathing    Body parts bathed by patient: Right arm, Left arm, Chest, Abdomen, Right upper leg, Buttocks, Front perineal area, Left upper leg, Right lower leg, Left lower leg, Face         Bathing assist Assist Level: Contact Guard/Touching assist     Upper Body Dressing/Undressing Upper body dressing   What is the patient wearing?: Pull over shirt    Upper body assist Assist Level: Minimal Assistance - Patient > 75%    Lower Body Dressing/Undressing Lower body dressing      What is the patient wearing?: Underwear/pull up, Pants     Lower body assist Assist for lower body dressing: Minimal Assistance - Patient > 75%     Toileting Toileting    Toileting assist Assist for toileting: Contact Guard/Touching assist     Transfers Chair/bed transfer  Transfers assist     Chair/bed transfer assist level: Minimal Assistance - Patient > 75%     Locomotion Ambulation   Ambulation assist      Assist level: Minimal Assistance - Patient > 75% Assistive device: No Device Max distance: 300+   Walk 10 feet activity   Assist  Assist level: Minimal Assistance - Patient > 75% Assistive device: No Device, Hand held assist   Walk 50 feet activity   Assist    Assist level: Minimal Assistance - Patient > 75% Assistive device: No Device, Hand held assist    Walk 150 feet activity   Assist    Assist level: Minimal Assistance - Patient > 75% Assistive device: No Device, Hand held assist    Walk 10 feet on uneven surface  activity   Assist     Assist level: Minimal Assistance - Patient > 75% Assistive device: Hand held assist   Wheelchair     Assist Is the patient using a wheelchair?: Yes Type of Wheelchair:  Manual    Wheelchair assist level: Dependent - Patient 0%      Wheelchair 50 feet with 2 turns activity    Assist        Assist Level: Dependent - Patient 0%   Wheelchair 150 feet activity     Assist      Assist Level: Dependent - Patient 0%   Blood pressure (!) 144/74, pulse 70, temperature (!) 97 F (36.1 C), temperature source Oral, resp. rate 18, height 5\' 5"  (1.651 m), weight 59.4 kg, SpO2 97%.  Medical Problem List and Plan: 1. 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  Right homonymous hemianopsia and sensory ataxia UE and LE              -patient may  shower-  -ELOS/Goals: 12-18 days supervision due to poor memory- could not set d/c date yet in team conf             -CIR PT, OT, SLP  2.  Antithrombotics: -DVT/anticoagulation:  Pharmaceutical: Lovenox 60mg  BID             -antiplatelet therapy: ASA 81mg  daily 3. Pain Management: Tylenol prn for pain.  4. Mood/Behavior/Sleep: LCSW to follow for evaluation and support.              -antipsychotic agents: N/A  -melatonin PRN, xanax 0.5mg  daily PRN 5. Neuropsych/cognition: This patient is not fully capable of making decisions on of own behalf. 6. Skin/Wound Care: Routine pressure relief measures.  7. Fluids/Electrolytes/Nutrition: Monitor intake. Routine labs, continue supplements 8. PE/DVT: To start treatment dose of lovenox in am- R>L PE onset in January 2025 9. Moderate degree dementia: just dx'd Advancement past PE/hypoxia per 05/2023 visit.  --Now back on Aricept 10mg  QHS -Recent memory impaired orientation impaired, mild irritability but no agitation thus far. 10. Bronch/Biopsy of 4.4 x 2+ cm R middle lung nodule that's suspicious for cancer, cytology revealed adenocarcinoma, await pulmonary medicine recs  11. HLD- con't atorvastatin 20mg  daily 12. Constipation:  -06/18/23 no BM documented since 4/8, will start on  colace BID and miralax daily -06/19/23 medium BM yesterday, cont regimen for now 13. GERD/GI ppx: protonix 40mg  daily 14. Anemia:   -06/18/23 Hgb stable this past week, 9.6 on 4/9. Monitor.   LOS: 5 days A FACE TO FACE EVALUATION WAS PERFORMED  [address] 06/19/2023, 9:56 AM

## 2023-06-20 ENCOUNTER — Other Ambulatory Visit (HOSPITAL_COMMUNITY): Payer: Self-pay

## 2023-06-20 ENCOUNTER — Telehealth (HOSPITAL_COMMUNITY): Payer: Self-pay

## 2023-06-20 DIAGNOSIS — C3491 Malignant neoplasm of unspecified part of right bronchus or lung: Secondary | ICD-10-CM

## 2023-06-20 DIAGNOSIS — I6340 Cerebral infarction due to embolism of unspecified cerebral artery: Secondary | ICD-10-CM | POA: Diagnosis not present

## 2023-06-20 DIAGNOSIS — R278 Other lack of coordination: Secondary | ICD-10-CM | POA: Diagnosis not present

## 2023-06-20 DIAGNOSIS — G301 Alzheimer's disease with late onset: Secondary | ICD-10-CM | POA: Diagnosis not present

## 2023-06-20 DIAGNOSIS — H53461 Homonymous bilateral field defects, right side: Secondary | ICD-10-CM | POA: Diagnosis not present

## 2023-06-20 LAB — BASIC METABOLIC PANEL WITH GFR
Anion gap: 7 (ref 5–15)
BUN: 23 mg/dL (ref 8–23)
CO2: 22 mmol/L (ref 22–32)
Calcium: 9 mg/dL (ref 8.9–10.3)
Chloride: 108 mmol/L (ref 98–111)
Creatinine, Ser: 1.23 mg/dL — ABNORMAL HIGH (ref 0.44–1.00)
GFR, Estimated: 44 mL/min — ABNORMAL LOW (ref >60)
Glucose, Bld: 110 mg/dL — ABNORMAL HIGH (ref 70–99)
Potassium: 4.2 mmol/L (ref 3.5–5.1)
Sodium: 137 mmol/L (ref 135–145)

## 2023-06-20 LAB — CBC
HCT: 27.8 % — ABNORMAL LOW (ref 36.0–46.0)
Hemoglobin: 8.7 g/dL — ABNORMAL LOW (ref 12.0–15.0)
MCH: 25.7 pg — ABNORMAL LOW (ref 26.0–34.0)
MCHC: 31.3 g/dL (ref 30.0–36.0)
MCV: 82.2 fL (ref 80.0–100.0)
Platelets: 307 10*3/uL (ref 150–400)
RBC: 3.38 MIL/uL — ABNORMAL LOW (ref 3.87–5.11)
RDW: 16.8 % — ABNORMAL HIGH (ref 11.5–15.5)
WBC: 4.7 10*3/uL (ref 4.0–10.5)
nRBC: 0 % (ref 0.0–0.2)

## 2023-06-20 NOTE — Progress Notes (Signed)
 Physical Therapy Session Note  Patient Details  Name: Tara Martin MRN: [id number] Date of Birth: June 11, 1940  Today's Date: 06/20/2023 PT Individual Time: 9528-4132 PT Individual Time Calculation (min): 42 min   Short Term Goals: Week 1:  PT Short Term Goal 1 (Week 1): pt will perform stand pivot transfer with LRAD and CGA PT Short Term Goal 2 (Week 1): pt will ambulate 150 feet with LRAD and CGA PT Short Term Goal 3 (Week 1): pt will navigate 5 steps with B HR and CGA  Skilled Therapeutic Interventions/Progress Updates:      Pt seated in WC upon arrival. Pt agreeable to therapy. Pt denies any pain.   Pt husband present in room. Therapist verified pt home entry, pt husband reports pt typically uses back entrance which has 3 steps with L ascending handrail.   Pt navigated 8 6 inch steps with B UE support on L ascending/R descending handrail and CGA, verbal cues provided for UE positioning and technique, and R UE attention.   Pt ambulated loop around day room/nursing station with CGA/min A, with 2# ankle weight on L LE for improved weight shift, verbal cues provided for pathway finding and obstacle negotiation B.   Pt ambulated in day room to locate pink hearts (numbered 1-5), pt required CGA/min A, verbal cues provided for pathway finding and attention to R visual field.   Pt ambulated throughout rehab apartment with CGA with no AD and performed ambulatory transfer to couch, recliner and bed with CGA, verbal cues provided for technique for controlled descent.   Pt seated in WC at end of session with all needs within reach and seatbelt alarm on.     Therapy Documentation Precautions:  Precautions Precautions: Fall Recall of Precautions/Restrictions: Impaired Precaution/Restrictions Comments: Fall, R hemi, R inattention, R visual field neglect Restrictions Weight Bearing Restrictions Per Provider Order: No  Therapy/Group: Individual Therapy  [facility] Jenney Modest, [place], DPT  06/20/2023, 3:58 PM

## 2023-06-20 NOTE — Progress Notes (Signed)
 Speech Language Pathology Daily Session Note  Patient Details  Name: Tara Martin MRN: [id number] Date of Birth: Jun 07, 1940  Today's Date: 06/20/2023 SLP Individual Time: 0807-0902 SLP Individual Time Calculation (min): 55 min  Short Term Goals: Week 1: SLP Short Term Goal 1 (Week 1): Patient will utilize external memory aids to recall basic orientation information with 75% accuracy given max assist. SLP Short Term Goal 2 (Week 1): Patient will utilize external aids to state date in 4/5 opportunities given max assist. SLP Short Term Goal 3 (Week 1): Patient will utilize external aids/signs to implement basic safety awareness strategies in 4/5 opportunities given max assist. SLP Short Term Goal 4 (Week 1): Patient will demonstrate safety awareness during basic problem solving tasks with 80% accuracy given max assist.  Skilled Therapeutic Interventions: SLP conducted skilled therapy session targeting cognitive retraining goals. At beginning of session, patient presented with breakfast tray from nursing staff. SLP provided full supervision for patient. During meal, patient required setup assist and mod cues to locate items on the right side of the tray. Throughout meal, discussed orientation items with SLP providing education re: reason of admission, timeline, date, and purpose of rehabilitation. Patient benefited from total assist to recall information after 2 minute distracted delay despite multiple attempted trials. Suspect baseline dementia may be impacting ability for carryover more than family reported upon evaluation given limited improvement in memory thus far. When asked for today's date 3 times across session, patient provided date of birth each time. Patient requires max assist for problem solving throughout meal. Patient was left in room with call bell in reach and alarm set. SLP will continue to target goals per plan of care.        Pain  None endorsed  Therapy/Group: Individual  Therapy  Dorla Gartner, M.A., CCC-SLP  Jackie Littlejohn A Tommi Crepeau 06/20/2023, 9:04 AM

## 2023-06-20 NOTE — Consult Note (Signed)
 [facility] CONSULT NOTE  Patient Care Team: Kristian Covey, MD as PCP - General (Family Medicine) Pollyann Savoy, MD as Consulting Physician (Rheumatology) Freddy Finner, MD (Inactive) as Consulting Physician (Obstetrics and Gynecology) Marisue Brooklyn, DO as Referring Physician (Internal Medicine)  CHIEF COMPLAINTS/PURPOSE OF CONSULTATION:  Adenocarcinoma, lung  REFERRING PHYSICIAN: Dr. Wynn Banker  HISTORY OF PRESENTING ILLNESS:  Tara Martin 83 y.o. female who was admitted to the hospital on 06/14/2023 due to stroke and advancement of dementia.  Workup was done including CT of chest which showed medial right middle lobe mass compatible with primary lung cancer and mediastinal adenopathy.  Therefore oncology consult has been requested. Patient is seen today awake alert and oriented sitting in wheelchair at bedside.  Patient's spouse also at bedside who answers most of the questions for her.  Spouse states patient is not aware of oncologic diagnosis however he is aware.  Patient is very pleasant and conversant during examination. Medical history significant for lung mass as stated above, DVT and bilateral PE, and Alzheimer's. Surgical history includes gallbladder removal several years ago several years ago per patient's husband. Family history includes a maternal grandmother with skin cancer on her face.  Patient has a brother with skin cancer as well. Social history is noncontributory: Denies tobacco use, denies alcohol use, denies illicit or recreational drug use.  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     I have reviewed her chart and materials related to her cancer extensively and collaborated history with the patient. Summary of oncologic history is as follows: Oncology History   No history exists.    ASSESSMENT & PLAN:  Adenocarcinoma of right lung with metastatic lymphadenopathy - CT scan chest done 03/09/2023 showed 2.5 cm  somewhat spiculated mass within RML. - CT scan done this admission 06/09/2023 shows progression and bulky metastatic lymphadenopathy in the visible mediastinum, bilateral lower neck/thoracic inlet.  Right middle lobe mass enlarged 4.0 x 2.2 compared to 2.6 x 1.3 cm previously.  Most compatible with primary lung cancer.  Patient previous to admission was scheduled for outpatient PET scan on 06/21/2023.   - Status post bronchoscopy on 06/14/2023, pathology confirms adenocarcinoma. - Recommend palliative eval for goals of care discussion with patient and spouse. - Medical oncology/Dr. Cherly Hensen following  DVT History of bilateral PE - Diagnosed with bilateral PE on 03/09/2023 - Was on Eliquis and ASA.  Stroke/CVA - CT head done 06/08/2023 showed acute to subacute infarct in the left occipital lobe.  No acute hemorrhage.  No significant mass effect or midline shift. - MRI head done 06/08/2023 shows multifocal acute ischemia.  Petechial hemorrhage within posterior right hemisphere. - Continue supportive care  Anemia, normocytic - Likely due to malignancy and comorbidities - Hemoglobin 8.7 today - Recommend transfuse PRBC for Hgb <7.0.  No transfusional intervention required at this time. - Continue to monitor CBC with differential  AKI - Avoid nephrotoxic agents - Monitor renal function  Altered mental status Dementia - Patient is pleasant and conversant today.  However unable to answer medical history questions. - MRI head done 06/08/2023. -Continue supportive care   MEDICAL HISTORY:  Past Medical History:  Diagnosis Date   Anxiety state, unspecified - sees Dr. Jennette Kettle in gyn and benzo prescribed there 04/09/2013   Arthritis    Chicken pox    Hx of blood clots    jan 2025   Lupus    MCI (mild cognitive impairment) 05/20/2020   Migraines  Prediabetes     SURGICAL HISTORY: Past Surgical History:  Procedure Laterality Date   BACK SURGERY  2000   BRONCHIAL BRUSHINGS  06/14/2023   Procedure:  BRONCHOSCOPY, WITH BRUSH BIOPSY;  Surgeon: Leslye Peer, MD;  Location: [facility] ENDOSCOPY;  Service: Cardiopulmonary;;   BRONCHIAL NEEDLE ASPIRATION BIOPSY  06/14/2023   Procedure: BRONCHOSCOPY, WITH NEEDLE ASPIRATION BIOPSY;  Surgeon: Leslye Peer, MD;  Location: [facility] ENDOSCOPY;  Service: Cardiopulmonary;;   CHOLECYSTECTOMY     DILATION AND CURETTAGE OF UTERUS  1973   GALLBLADDER SURGERY  1974   VIDEO BRONCHOSCOPY WITH ENDOBRONCHIAL ULTRASOUND Bilateral 06/14/2023   Procedure: BRONCHOSCOPY, WITH EBUS;  Surgeon: Leslye Peer, MD;  Location: [facility] ENDOSCOPY;  Service: Cardiopulmonary;  Laterality: Bilateral;    SOCIAL HISTORY: Social History   Socioeconomic History   Marital status: Married    Spouse name: chuck   Number of children: 1   Years of education: Not on file   Highest education level: Not on file  Occupational History   Occupation: retired  Tobacco Use   Smoking status: Never   Smokeless tobacco: Never  Vaping Use   Vaping status: Never Used  Substance and Sexual Activity   Alcohol use: Not Currently    Alcohol/week: 2.0 standard drinks of alcohol    Types: 2 Glasses of wine per week   Drug use: No   Sexual activity: Not Currently  Other Topics Concern   Not on file  Social History Narrative   Lives with husband   Right Handed   Drinks 1 cup maybe every other day   Social Drivers of Health   Financial Resource Strain: Low Risk  (06/25/2022)   Overall Financial Resource Strain (CARDIA)    Difficulty of Paying Living Expenses: Not hard at all  Food Insecurity: No Food Insecurity (06/08/2023)   Hunger Vital Sign    Worried About Running Out of Food in the Last Year: Never true    Ran Out of Food in the Last Year: Never true  Transportation Needs: No Transportation Needs (06/08/2023)   PRAPARE - Administrator, Civil Service (Medical): No    Lack of Transportation (Non-Medical): No  Physical Activity: Insufficiently Active (06/25/2022)   Exercise Vital Sign     Days of Exercise per Week: 4 days    Minutes of Exercise per Session: 30 min  Stress: No Stress Concern Present (06/25/2022)   [employer] of Occupational Health - Occupational Stress Questionnaire    Feeling of Stress : Not at all  Social Connections: Unknown (06/08/2023)   Social Connection and Isolation Panel [NHANES]    Frequency of Communication with Friends and Family: Patient declined    Frequency of Social Gatherings with Friends and Family: Patient declined    Attends Religious Services: Patient declined    Database administrator or Organizations: Yes    Attends Banker Meetings: Patient declined    Marital Status: Married  Catering manager Violence: Not At Risk (06/08/2023)   Humiliation, Afraid, Rape, and Kick questionnaire    Fear of Current or Ex-Partner: No    Emotionally Abused: No    Physically Abused: No    Sexually Abused: No    FAMILY HISTORY: Family History  Problem Relation Age of Onset   Arthritis Mother    Diabetes Mother    Heart disease Mother    Heart attack Mother    Pneumonia Father    COPD Sister    Alzheimer's disease Sister  Heart disease Brother    Pulmonary embolism Brother    Alzheimer's disease Brother    Migraines Daughter    Alcoholism Other    Arthritis Other    Hypertension Other    Sudden death Other    Colon cancer Neg Hx    Esophageal cancer Neg Hx    Stomach cancer Neg Hx    Colon polyps Neg Hx      PHYSICAL EXAMINATION: ECOG PERFORMANCE STATUS: 3 - Symptomatic, >50% confined to bed  Vitals:   06/19/23 1917 06/20/23 0418  BP: (!) 129/59 (!) 143/65  Pulse: 67 63  Resp: 18 18  Temp: 97.6 F (36.4 C) 97.9 F (36.6 C)  SpO2: 95% 97%   Filed Weights   06/14/23 1818  Weight: 130 lb 15.3 oz (59.4 kg)    GENERAL: alert, no distress and comfortable SKIN: skin color, texture, turgor are normal, no rashes or significant lesions EYES: normal, conjunctiva are pink and non-injected, sclera  clear OROPHARYNX: no exudate, no erythema and lips, buccal mucosa, and tongue normal  NECK: supple, thyroid normal size, non-tender, without nodularity LYMPH: no palpable lymphadenopathy in the cervical, axillary or inguinal LUNGS: clear to auscultation and percussion with normal breathing effort HEART: regular rate & rhythm and no murmurs and no lower extremity edema ABDOMEN: abdomen soft, non-tender and normal bowel sounds MUSCULOSKELETAL: + Difficulty ambulating PSYCH: alert & oriented x 2 with fluent speech NEURO: no focal motor/sensory deficits   ALLERGIES:  is allergic to codeine.  MEDICATIONS:  Current Facility-Administered Medications  Medication Dose Route Frequency Provider Last Rate Last Admin   acetaminophen (TYLENOL) tablet 325-650 mg  325-650 mg Oral Q4H PRN Love, Pamela S, PA-C       ALPRAZolam Prudy Feeler) tablet 0.5 mg  0.5 mg Oral Daily PRN Jacquelynn Cree, PA-C   0.5 mg at 06/19/23 2242   alum & mag hydroxide-simeth (MAALOX/MYLANTA) 200-200-20 MG/5ML suspension 30 mL  30 mL Oral Q4H PRN Love, Pamela S, PA-C       aspirin EC tablet 81 mg  81 mg Oral Daily Jacquelynn Cree, PA-C   81 mg at 06/20/23 1610   atorvastatin (LIPITOR) tablet 20 mg  20 mg Oral Daily Jacquelynn Cree, PA-C   20 mg at 06/20/23 9604   bisacodyl (DULCOLAX) suppository 10 mg  10 mg Rectal Daily PRN Jacquelynn Cree, PA-C   10 mg at 06/17/23 0542   diphenhydrAMINE (BENADRYL) capsule 25 mg  25 mg Oral Q6H PRN Love, Pamela S, PA-C       docusate sodium (COLACE) capsule 100 mg  100 mg Oral BID Street, [place], [place]   100 mg at 06/20/23 5409   donepezil (ARICEPT) tablet 10 mg  10 mg Oral QHS Love, Pamela S, PA-C   10 mg at 06/19/23 2242   enoxaparin (LOVENOX) injection 60 mg  60 mg Subcutaneous Q12H Jacquelynn Cree, PA-C   60 mg at 06/20/23 8119   feeding supplement (ENSURE ENLIVE / ENSURE PLUS) liquid 237 mL  237 mL Oral BID BM Jacquelynn Cree, PA-C   237 mL at 06/16/23 1423   guaiFENesin-dextromethorphan (ROBITUSSIN  DM) 100-10 MG/5ML syrup 5-10 mL  5-10 mL Oral Q6H PRN Love, Pamela S, PA-C       melatonin tablet 5 mg  5 mg Oral QHS PRN Love, Pamela S, PA-C   5 mg at 06/17/23 2011   pantoprazole (PROTONIX) EC tablet 40 mg  40 mg Oral Daily Love, Evlyn Kanner, PA-C  40 mg at 06/20/23 0806   polyethylene glycol (MIRALAX / GLYCOLAX) packet 17 g  17 g Oral Daily [address], [place], [place]   17 g at 06/20/23 1610   prochlorperazine (COMPAZINE) tablet 5-10 mg  5-10 mg Oral Q6H PRN Delle Reining S, PA-C       Or   prochlorperazine (COMPAZINE) suppository 12.5 mg  12.5 mg Rectal Q6H PRN Love, Pamela S, PA-C       Or   prochlorperazine (COMPAZINE) injection 5-10 mg  5-10 mg Intravenous Q6H PRN Love, Pamela S, PA-C       sodium phosphate (FLEET) enema 1 enema  1 enema Rectal Once PRN Jacquelynn Cree, PA-C         LABORATORY DATA:  I have reviewed the data as listed Lab Results  Component Value Date   WBC 4.7 06/20/2023   HGB 8.7 (L) 06/20/2023   HCT 27.8 (L) 06/20/2023   MCV 82.2 06/20/2023   PLT 307 06/20/2023   Recent Labs    06/08/23 1305 06/08/23 1317 06/09/23 0422 06/10/23 0410 06/14/23 0431 06/15/23 0518 06/20/23 0538  NA 135   < > 135   < > 136 136 137  K 3.3*   < > 3.6   < > 4.3 4.4 4.2  CL 104   < > 106   < > 102 106 108  CO2 20*  --  19*   < > 23 20* 22  GLUCOSE 105*   < > 90   < > 110* 126* 110*  BUN 16   < > 11   < > 16 17 23   CREATININE 0.84   < > 0.79   < > 0.95 0.90 1.23*  CALCIUM 9.2  --  8.6*   < > 8.8* 9.1 9.0  GFRNONAA >60  --  >60   < > >60 >60 44*  PROT 7.8  --  6.2*  --   --  6.5  --   ALBUMIN 3.7  --  2.9*  --   --  2.9*  --   AST 22  --  19  --   --  24  --   ALT 11  --  10  --   --  15  --   ALKPHOS 75  --  65  --   --  61  --   BILITOT 0.6  --  0.3  --   --  0.5  --    < > = values in this interval not displayed.    RADIOGRAPHIC STUDIES: I have personally reviewed the radiological images as listed and agreed with the findings in the report. VAS US CAROTID Result Date:  06/11/2023 Carotid Arterial Duplex Study Patient Name:  Tara Martin Benedict  Date of Exam:   06/09/2023 Medical Rec #: [id number]         Accession #:    [id number] Date of Birth: [birth year]         Patient Gender: F Patient Age:   56 years Exam Location:  [facility] Procedure:      VAS US CAROTID Referring Phys: DAVID ORTIZ --------------------------------------------------------------------------------  Indications:       CVA, Speech disturbance, Numbness and Weakness. Risk Factors:      Hyperlipidemia, no history of smoking. Other Factors:     Late onset Alzheimer's, Lupus, right middle lobe lung mass,                    question  malignancy. Comparison Study:  No prior study Performing Technologist: Sherren Kerns RVS  Examination Guidelines: A complete evaluation includes B-mode imaging, spectral Doppler, color Doppler, and power Doppler as needed of all accessible portions of each vessel. Bilateral testing is considered an integral part of a complete examination. Limited examinations for reoccurring indications may be performed as noted.  Right Carotid Findings: +----------+--------+--------+--------+------------------+--------+           PSV cm/sEDV cm/sStenosisPlaque DescriptionComments +----------+--------+--------+--------+------------------+--------+ CCA Prox  98      19                                         +----------+--------+--------+--------+------------------+--------+ CCA Distal78      16                                         +----------+--------+--------+--------+------------------+--------+ ICA Prox  75      27              heterogenous               +----------+--------+--------+--------+------------------+--------+ ICA Mid   82      27                                         +----------+--------+--------+--------+------------------+--------+ ICA Distal130     33                                tortuous  +----------+--------+--------+--------+------------------+--------+ ECA       77      9                                          +----------+--------+--------+--------+------------------+--------+ +----------+--------+-------+--------+-------------------+           PSV cm/sEDV cmsDescribeArm Pressure (mmHG) +----------+--------+-------+--------+-------------------+ Subclavian101                                        +----------+--------+-------+--------+-------------------+ +---------+--------+--+--------+--+ VertebralPSV cm/s60EDV cm/s17 +---------+--------+--+--------+--+  Left Carotid Findings: +----------+--------+--------+--------+------------------+--------+           PSV cm/sEDV cm/sStenosisPlaque DescriptionComments +----------+--------+--------+--------+------------------+--------+ CCA Prox  84      16                                         +----------+--------+--------+--------+------------------+--------+ CCA Distal70      17                                         +----------+--------+--------+--------+------------------+--------+ ICA Prox  38      10              heterogenous               +----------+--------+--------+--------+------------------+--------+ ICA Mid   89      26                                         +----------+--------+--------+--------+------------------+--------+  ICA Distal79      25                                         +----------+--------+--------+--------+------------------+--------+ ECA       114     11                                         +----------+--------+--------+--------+------------------+--------+ +----------+--------+--------+--------+-------------------+           PSV cm/sEDV cm/sDescribeArm Pressure (mmHG) +----------+--------+--------+--------+-------------------+ Subclavian69                                           +----------+--------+--------+--------+-------------------+ +---------+--------+--+--------+--+ VertebralPSV cm/s41EDV cm/s15 +---------+--------+--+--------+--+   Summary: Right Carotid: The extracranial vessels were near-normal with only minimal wall                thickening or plaque. Left Carotid: The extracranial vessels were near-normal with only minimal wall               thickening or plaque. Vertebrals:  Bilateral vertebral arteries demonstrate antegrade flow. Subclavians: Normal flow hemodynamics were seen in bilateral subclavian              arteries. *See table(s) above for measurements and observations.  Electronically signed by Delia Heady MD on 06/11/2023 at 11:02:14 AM.    Final    CT CHEST WO CONTRAST Result Date: 06/09/2023 CLINICAL DATA:  Pleural mass EXAM: CT CHEST WITHOUT CONTRAST TECHNIQUE: Multidetector CT imaging of the chest was performed following the standard protocol without IV contrast. RADIATION DOSE REDUCTION: This exam was performed according to the departmental dose-optimization program which includes automated exposure control, adjustment of the mA and/or kV according to patient size and/or use of iterative reconstruction technique. COMPARISON:  03/09/2023 FINDINGS: Cardiovascular: Heart is normal size. Aorta is normal caliber. Small pericardial effusion. Scattered coronary artery and aortic calcifications. Mediastinum/Nodes: Mediastinal adenopathy is stable since prior study. Difficult to assess the hila right without IV contrast, but right hilar fullness is suspicious for adenopathy. No axillary adenopathy. Trachea and esophagus are unremarkable. Thyroid unremarkable. Lungs/Pleura: Medial right middle lobe mass again noted measuring 4.0 x 2.2 cm compared to 2.6 x 1.3 cm on prior study when measured at the same level and in the same planes. This is highly suspicious for primary lung cancer. Anterior right middle lobe nodule measures 8 mm compared to 7 mm previously.  Dependent atelectasis or scarring. No effusions. Upper Abdomen: No acute findings Musculoskeletal: Chest wall soft tissues are unremarkable. No acute bony abnormality. IMPRESSION: Medial right middle lobe mass has enlarged since prior study, now measuring approximately 4.0 x 2.2 cm compared to 2.6 x 1.3 cm previously. This is most compatible with primary lung cancer. This could be further evaluated with PET CT. Stable mediastinal adenopathy. Difficult to assess the hila without IV contrast, but right hilar fullness is suspicious for adenopathy. Bibasilar atelectasis or scarring. Small pericardial effusion. Aortic Atherosclerosis (ICD10-I70.0). Electronically Signed   By: Charlett Nose M.D.   On: 06/09/2023 19:34   ECHOCARDIOGRAM COMPLETE BUBBLE STUDY Result Date: 06/09/2023    ECHOCARDIOGRAM REPORT   Patient Name:   Tara Martin Aguilar Date of Exam: 06/09/2023 Medical Rec #:  [id number]        Height:       65.0 in Accession #:    [id number]       Weight:       135.0 lb Date of Birth:  Sep 19, 1940        BSA:          1.674 m Patient Age:    82 years         BP:           158/83 mmHg Patient Gender: F                HR:           63 bpm. Exam Location:  Inpatient Procedure: 2D Echo, Color Doppler, Cardiac Doppler and Saline Contrast Bubble            Study (Both Spectral and Color Flow Doppler were utilized during            procedure). Indications:    Stroke 434.91 / I63.9  History:        Patient has prior history of Echocardiogram examinations, most                 recent 03/10/2023.  Sonographer:    Hersey Lorenzo RDCS Referring Phys: [phone] DAVID MANUEL ORTIZ IMPRESSIONS  1. Left ventricular ejection fraction, by estimation, is 60 to 65%. Left ventricular ejection fraction by PLAX is 64 %. The left ventricle has normal function. The left ventricle has no regional wall motion abnormalities. Left ventricular diastolic parameters are consistent with Grade I diastolic dysfunction (impaired relaxation).  2. Right  ventricular systolic function is normal. The right ventricular size is normal.  3. There is no evidence of cardiac tamponade.  4. The mitral valve is abnormal. Mild mitral valve regurgitation.  5. The aortic valve is tricuspid. Aortic valve regurgitation is not visualized.  6. The inferior vena cava is normal in size with greater than 50% respiratory variability, suggesting right atrial pressure of 3 mmHg.  7. Doppler suspicious of left to right atrial level shunting by color flow Doppler. However, agitated saline contrast bubble study was negative, with no evidence of right to left interatrial shunt. There is a likely a patent foramen ovale with predominantly left to right shunting across the atrial septum. Comparison(s): Changes from prior study are noted. 03/10/2023: LVEF 60-65%. Conclusion(s)/Recommendation(s): Findings suggest possible PFO with left to right shunting, however, no right to left shunting seen by saline microbubble contrast - even with valsalva. An area of bubble "Washout" was noted at the atrial septum, which could be due to brisk left to right flow. Consider further evaluation with transesophageal echocardiogram and bubble study which would provide more definitive images. FINDINGS  Left Ventricle: Left ventricular ejection fraction, by estimation, is 60 to 65%. Left ventricular ejection fraction by PLAX is 64 %. The left ventricle has normal function. The left ventricle has no regional wall motion abnormalities. The left ventricular internal cavity size was normal in size. There is no left ventricular hypertrophy. Left ventricular diastolic parameters are consistent with Grade I diastolic dysfunction (impaired relaxation). Indeterminate filling pressures. Right Ventricle: The right ventricular size is normal. No increase in right ventricular wall thickness. Right ventricular systolic function is normal. Left Atrium: Left atrial size was normal in size. Right Atrium: Right atrial size was normal in  size. Prominent Chiari network. Pericardium: Trivial pericardial effusion is present. The pericardial effusion is circumferential. There is no evidence of cardiac tamponade. Mitral  Valve: The mitral valve is abnormal. Mild mitral valve regurgitation. Tricuspid Valve: The tricuspid valve is grossly normal. Tricuspid valve regurgitation is trivial. Aortic Valve: The aortic valve is tricuspid. Aortic valve regurgitation is not visualized. Pulmonic Valve: The pulmonic valve was normal in structure. Pulmonic valve regurgitation is not visualized. Aorta: The aortic root and ascending aorta are structurally normal, with no evidence of dilitation. Venous: The inferior vena cava is normal in size with greater than 50% respiratory variability, suggesting right atrial pressure of 3 mmHg. IAS/Shunts: The interatrial septum is aneurysmal. Evidence of atrial level shunting detected by color flow Doppler. Agitated saline contrast was given intravenously to evaluate for intracardiac shunting. Agitated saline contrast bubble study was negative, with no evidence of any interatrial shunt. A moderately sized patent foramen ovale is detected with predominantly left to right shunting across the atrial septum.  LEFT VENTRICLE PLAX 2D LV EF:         Left            Diastology                ventricular     LV e' medial:    5.98 cm/s                ejection        LV E/e' medial:  11.5                fraction by     LV e' lateral:   9.03 cm/s                PLAX is 64      LV E/e' lateral: 7.6                %. LVIDd:         4.10 cm LVIDs:         2.70 cm LV PW:         1.00 cm LV IVS:        1.00 cm LVOT diam:     2.00 cm LV SV:         65 LV SV Index:   39 LVOT Area:     3.14 cm  RIGHT VENTRICLE             IVC RV S prime:     19.90 cm/s  IVC diam: 1.40 cm TAPSE (M-mode): 1.7 cm LEFT ATRIUM           Index        RIGHT ATRIUM           Index LA diam:      3.60 cm 2.15 cm/m   RA Area:     10.10 cm LA Vol (A2C): 38.0 ml 22.70 ml/m  RA  Volume:   19.80 ml  11.83 ml/m LA Vol (A4C): 34.8 ml 20.79 ml/m  AORTIC VALVE LVOT Vmax:   86.20 cm/s LVOT Vmean:  59.300 cm/s LVOT VTI:    0.207 m  AORTA Ao Root diam: 3.10 cm Ao Asc diam:  3.20 cm MITRAL VALVE MV Area (PHT): 2.35 cm    SHUNTS MV Decel Time: 323 msec    Systemic VTI:  0.21 m MV E velocity: 68.70 cm/s  Systemic Diam: 2.00 cm MV A velocity: 87.50 cm/s MV E/A ratio:  0.79 Dinah Franco MD Electronically signed by Dinah Franco MD Signature Date/Time: 06/09/2023/4:47:04 PM    Final    CT ANGIO HEAD NECK W WO CM Result Date:  06/09/2023 CLINICAL DATA:  83 year old female neurologic deficit. Multifocal infarcts in the anterior and posterior circulation on MRI yesterday. EXAM: CT ANGIOGRAPHY HEAD AND NECK WITH AND WITHOUT CONTRAST TECHNIQUE: Multidetector CT imaging of the head and neck was performed using the standard protocol during bolus administration of intravenous contrast. Multiplanar CT image reconstructions and MIPs were obtained to evaluate the vascular anatomy. Carotid stenosis measurements (when applicable) are obtained utilizing NASCET criteria, using the distal internal carotid diameter as the denominator. RADIATION DOSE REDUCTION: This exam was performed according to the departmental dose-optimization program which includes automated exposure control, adjustment of the mA and/or kV according to patient size and/or use of iterative reconstruction technique. CONTRAST:  60mL OMNIPAQUE IOHEXOL 350 MG/ML SOLN COMPARISON:  Brain MRI and head CT yesterday. CTA chest 03/09/2023. FINDINGS: CT HEAD Brain: Hypodense cytotoxic edema most pronounced in the left PCA territory, stable there from the CT yesterday. Increased visualization of cytotoxic edema in the left thalamus since yesterday (series 5, image 14). Other small areas of abnormal diffusion on MRI remain occult by CT. No acute intracranial hemorrhage identified. No acute intracranial hemorrhage identified. Stable ventricle size and  configuration. Normal basilar cisterns. Calvarium and skull base: Intact, stable. Paranasal sinuses: Visualized paranasal sinuses and mastoids are stable and well aerated. Orbits: No acute orbit or scalp soft tissue finding. CTA NECK Skeleton: Cervical spine degeneration. No acute osseous abnormality identified. Upper chest: Paraseptal emphysema or scattered pneumatocele is. Abnormal bulky superior mediastinal lymphadenopathy, appears malignant (series 10, image 168) and progressed since January. Suspected primary right middle lobe lung mass at that time not included today. Other neck: Thoracic inlet metastatic lymphadenopathy, bilateral lower neck level 4 stations is progressed since January (series 10, image 141). No other metastatic disease identified in the neck. Aortic arch: 3 vessel arch.  No significant arch atherosclerosis. Right carotid system: Tortuosity. Patent with no significant plaque or stenosis through the right carotid bifurcation. 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 The saccular component is also roughly 8 mm. No associated stenosis. Left carotid system: Tortuous left CCA and patent left carotid bifurcation without stenosis. However, attenuated right ICA distal to the bulb, circumferentially narrow due to 2-3 mm diameter. This remains patent to the skull base, with a small area of fusiform aneurysmal dilatation measuring 5-6 mm just below the skull base series 17, image 30. Vertebral arteries: Proximal subclavian arteries, cervical vertebral arteries are patent with tortuosity, no significant plaque or stenosis to the skull base. CTA HEAD Posterior circulation: Right vertebral artery is mildly dominant throughout. Patent distal vertebral arteries and vertebrobasilar junction with tortuosity but no significant plaque or stenosis. Patent PICA  origins. Patent basilar artery without stenosis. Patent SCA and left PCA origins. Fetal type right PCA origin. Prominent left posterior communicating artery also, which in light of the below left ICA siphon findings is probably collateral supply to the left ICA terminus. Left PCA is severely irregular and attenuated beginning at the junction of the P1 and posterior communicating artery, series 15, image 20, and P3 branches are poorly enhancing. Contralateral right PCA branches appear more normal, mildly irregular. Anterior circulation:  Both ICA siphons are patent. But the left petrous ICA is highly irregular and stenotic as seen on series 12, image 126, with probable soft plaque (series 14, image 152). Left ICA remains patent, appears more  normal in the cavernous and supraclinoid segments. Contralateral right ICA siphon is ectatic throughout, patent without stenosis. Both posterior communicating artery origins are normal. Patent carotid termini, MCA and ACA origins. Left ACA A1 is dominant and mildly irregular. Anterior communicating artery is diminutive but patent. Bilateral ACA branches are patent with mild irregularity. Right MCA M1 segment and bifurcation are patent without stenosis. Left MCA M1 segment and bifurcation are patent without stenosis. Bilateral MCA branches are patent with mild irregularity. Venous sinuses: Patent. Anatomic variants: Dominant right vertebral artery, right ACA A1, fetal type right PCA origin. Review of the MIP images confirms the above findings IMPRESSION: 1. Progressed since January and bulky Metastatic Lymphadenopathy in the visible mediastinum, bilateral lower neck/thoracic inlet. 2. Positive for Left PCA severe stenosis, ultimately occluded in the P3 segments, concordant with acute ischemia demonstrated by MRI. 3. Positive also for highly abnormal bilateral Internal Carotid Arteries: - Attenuated, Stenotic cervical Left ICA and Left siphon, including High-grade stenosis in the  petrous segment. Evidence of collateral supply to the Left ICA terminus from the posterior communicating artery, posterior circulation. 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 4. No other hemodynamically significant arterial stenosis in the head or neck. 5. Expected CT appearance of ischemia demonstrated by MRI yesterday. No hemorrhagic transformation or intracranial mass effect. Electronically Signed   By: Marlise Simpers M.D.   On: 06/09/2023 09:21   MR BRAIN WO CONTRAST Result Date: 06/08/2023 CLINICAL DATA:  Acute neurologic deficit EXAM: MRI HEAD WITHOUT CONTRAST TECHNIQUE: Multiplanar, multiecho pulse sequences of the brain and surrounding structures were obtained without intravenous contrast. COMPARISON:  None Available. FINDINGS: Brain: Multifocal abnormal diffusion restriction, including the left occipital lobe, left temporal lobe, left thalamus and within the bilateral parietal subcortical white matter. Petechial hemorrhage within the posterior right hemisphere. There is multifocal hyperintense T2-weighted signal within the white matter. Generalized volume loss. The midline structures are normal. Vascular: Normal flow voids. Skull and upper cervical spine: Normal calvarium and skull base. Visualized upper cervical spine and soft tissues are normal. Sinuses/Orbits:No paranasal sinus fluid levels or advanced mucosal thickening. No mastoid or middle ear effusion. Normal orbits. IMPRESSION: 1. Multifocal acute ischemia, including the left occipital lobe, left temporal lobe, left thalamus and within the bilateral parietal subcortical white matter. No hemorrhage or mass effect. 2. Petechial hemorrhage within the posterior right hemisphere. Electronically Signed   By: Juanetta Nordmann M.D.   On: 06/08/2023 20:06   CT Head Wo Contrast Addendum Date: 06/08/2023 ADDENDUM REPORT: 06/08/2023 16:43  ADDENDUM: These results were called by telephone at the time of interpretation on 06/08/2023 at 4:37 pm to provider Dr. Reba Camper, who verbally acknowledged these results. Electronically Signed   By: Tyron Gallon M.D.   On: 06/08/2023 16:43   Result Date: 06/08/2023 CLINICAL DATA:  Mental status change EXAM: CT HEAD WITHOUT CONTRAST TECHNIQUE: Contiguous axial images were obtained from the base of the skull through the vertex without intravenous contrast. RADIATION DOSE REDUCTION: This exam was performed according to the departmental dose-optimization program which includes automated exposure control, adjustment of the mA and/or kV according to patient size and/or use of iterative reconstruction technique. COMPARISON:  MRI brain 02/24/2021.  Head CT 02/02/2020. FINDINGS: Brain: Focal area of hypodensity with loss of gray-white matter distinction is seen in the left occipital lobe, new from prior. No significant mass effect or midline shift, but there is associated sulcal  effacement. No acute intracranial hemorrhage or hydrocephalus. There is mild diffuse atrophy. There is a small old cortical infarct in the high right parietal region which is new from 2021. Vascular: No hyperdense vessel or unexpected calcification. Skull: Normal. Negative for fracture or focal lesion. Sinuses/Orbits: No acute finding. Other: None. IMPRESSION: 1. Acute to subacute infarct in the left occipital lobe, new from prior. No significant mass effect or midline shift. No acute hemorrhage. 2. Small old cortical infarct in the high right parietal region, new from 2021. Electronically Signed: By: Tyron Gallon M.D. On: 06/08/2023 16:32   CT Cervical Spine Wo Contrast Result Date: 06/08/2023 CLINICAL DATA:  Trauma EXAM: CT CERVICAL SPINE WITHOUT CONTRAST TECHNIQUE: Multidetector CT imaging of the cervical spine was performed without intravenous contrast. Multiplanar CT image reconstructions were also generated. RADIATION DOSE REDUCTION: This  exam was performed according to the departmental dose-optimization program which includes automated exposure control, adjustment of the mA and/or kV according to patient size and/or use of iterative reconstruction technique. COMPARISON:  Cervical spine CT 02/02/2020 FINDINGS: Alignment: There is 2 mm of anterolisthesis at C7-T1 which is favored is degenerative and unchanged. Alignment is otherwise anatomic. Skull base and vertebrae: No acute fracture. No primary bone lesion or focal pathologic process. Soft tissues and spinal canal: No prevertebral fluid or swelling. No visible canal hematoma. Disc levels: There is moderate severe disc space narrowing and endplate osteophyte formation at C4-C5, C5-C6 and C6-C7 compatible with degenerative change. There is diffuse bilateral facet arthropathy causing mild neural foraminal stenosis most significant at C3-C4, C4-C5 and C5-C6 on right. Posterior disc osteophyte complexes are seen at C4-C5, C5-C6 and C6-C7 causing mild central canal stenosis. Upper chest: Negative. Other: None. IMPRESSION: 1. No acute fracture or traumatic subluxation of the cervical spine. 2. Multilevel degenerative changes of the cervical spine. Electronically Signed   By: Tyron Gallon M.D.   On: 06/08/2023 16:36     The total time spent in the appointment was 55 minutes encounter with patients including review of chart and various tests results, discussions about plan of care and coordination of care plan   All questions were answered. The patient knows to call the clinic with any problems, questions or concerns. No barriers to learning was detected.  Jacqualin Mate, NP 4/14/202512:14 PM

## 2023-06-20 NOTE — Progress Notes (Signed)
 PROGRESS NOTE   Subjective/Complaints:  Per OT attending better to right side   ROS-negative chest pain, shortness of breath, abd pain, nausea, vomiting, diarrhea  Objective:   No results found. Recent Labs    06/20/23 0538  WBC 4.7  HGB 8.7*  HCT 27.8*  PLT 307    Recent Labs    06/20/23 0538  NA 137  K 4.2  CL 108  CO2 22  GLUCOSE 110*  BUN 23  CREATININE 1.23*  CALCIUM 9.0     Intake/Output Summary (Last 24 hours) at 06/20/2023 8469 Last data filed at 06/19/2023 1258 Gross per 24 hour  Intake 240 ml  Output --  Net 240 ml        Physical Exam: Vital Signs Blood pressure (!) 143/65, pulse 63, temperature 97.9 F (36.6 C), temperature source Oral, resp. rate 18, height 5\' 5"  (1.651 m), weight 59.4 kg, SpO2 97%.   General: No acute distress, resting in bed Mood and affect appropriate today Heart: Regular rate and rhythm no rubs murmurs or extra sounds Lungs: Clear to auscultation, breathing unlabored, no rales or wheezes Abdomen: Positive bowel sounds, soft nontender to palpation, nondistended Extremities: No clubbing, cyanosis, or edema Skin: No evidence of breakdown, no evidence of rash over exposed surfaces  PRIOR EXAMS: Neurologic: Cranial nerves II through XII intact, motor strength is 5/5 in bilateral deltoid, bicep, tricep, grip, hip flexor, knee extensors, ankle dorsiflexor and plantar flexor Mild dysmetria RIght FNF and Right Heel --> shin Right homonymous hemianopsia-but compensating better  Sensation intact to LT , temp and proprio on Right side  Oriented to person and place but not time  Musculoskeletal: Full range of motion in all 4 extremities. No joint swelling   Assessment/Plan: 1. Functional deficits which require 3+ hours per day of interdisciplinary therapy in a comprehensive inpatient rehab setting. Physiatrist is providing close team supervision and 24 hour management of  active medical problems listed below. Physiatrist and rehab team continue to assess barriers to discharge/monitor patient progress toward functional and medical goals  Care Tool:  Bathing    Body parts bathed by patient: Right arm, Left arm, Chest, Abdomen, Right upper leg, Buttocks, Front perineal area, Left upper leg, Right lower leg, Left lower leg, Face         Bathing assist Assist Level: Contact Guard/Touching assist     Upper Body Dressing/Undressing Upper body dressing   What is the patient wearing?: Pull over shirt    Upper body assist Assist Level: Minimal Assistance - Patient > 75%    Lower Body Dressing/Undressing Lower body dressing      What is the patient wearing?: Underwear/pull up, Pants     Lower body assist Assist for lower body dressing: Minimal Assistance - Patient > 75%     Toileting Toileting    Toileting assist Assist for toileting: Contact Guard/Touching assist     Transfers Chair/bed transfer  Transfers assist     Chair/bed transfer assist level: Minimal Assistance - Patient > 75%     Locomotion Ambulation   Ambulation assist      Assist level: Minimal Assistance - Patient > 75% Assistive device: No Device Max  distance: 300+   Walk 10 feet activity   Assist     Assist level: Minimal Assistance - Patient > 75% Assistive device: No Device, Hand held assist   Walk 50 feet activity   Assist    Assist level: Minimal Assistance - Patient > 75% Assistive device: No Device, Hand held assist    Walk 150 feet activity   Assist    Assist level: Minimal Assistance - Patient > 75% Assistive device: No Device, Hand held assist    Walk 10 feet on uneven surface  activity   Assist     Assist level: Minimal Assistance - Patient > 75% Assistive device: Hand held assist   Wheelchair     Assist Is the patient using a wheelchair?: Yes Type of Wheelchair: Manual    Wheelchair assist level: Dependent - Patient  0%      Wheelchair 50 feet with 2 turns activity    Assist        Assist Level: Dependent - Patient 0%   Wheelchair 150 feet activity     Assist      Assist Level: Dependent - Patient 0%   Blood pressure (!) 143/65, pulse 63, temperature 97.9 F (36.6 C), temperature source Oral, resp. rate 18, height 5\' 5"  (1.651 m), weight 59.4 kg, SpO2 97%.  Medical Problem List and Plan: 1. 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  Right homonymous hemianopsia and sensory ataxia UE and LE              -patient may  shower-  -ELOS/Goals: 12-18 days supervision due to poor memory and reduced attn to RIght side , husband is legally blind , may benefit from AL level of care - could not set d/c date yet in team conf             -CIR PT, OT, SLP  2.  Antithrombotics: -DVT/anticoagulation:  Pharmaceutical: Lovenox 60mg  BID             -antiplatelet therapy: ASA 81mg  daily 3. Pain Management: Tylenol prn for pain.  4. Mood/Behavior/Sleep: LCSW to follow for evaluation and support.              -antipsychotic agents: N/A  -melatonin PRN, xanax 0.5mg  daily PRN 5. Neuropsych/cognition: This patient is not fully capable of making decisions on of own behalf. 6. Skin/Wound Care: Routine pressure relief measures.  7. Fluids/Electrolytes/Nutrition: Monitor intake. Routine labs, continue supplements 8. PE/DVT: probable hyper coag state due to adenocarcinoma To start treatment dose of lovenox R>L PE onset in January 2025 9. Moderate degree dementia: just dx'd Advancement past PE/hypoxia per 05/2023 visit.  --Now back on Aricept 10mg  QHS -Recent memory impaired orientation impaired, mild irritability but no agitation thus far. 10. Bronch/Biopsy of 4.4 x 2+ cm R middle lung nodule that's suspicious for cancer, cytology revealed adenocarcinoma, pulmonary med rec Onc eval , consult place 4/11 , will f/u on this today    11. HLD- con't atorvastatin 20mg  daily 12. Constipation:  LBM 4/13- on colace  13. GERD/GI ppx: protonix 40mg  daily 14. Anemia:   -slightly down will check stool OB since pt on anticoag    Latest Ref Rng & Units 06/20/2023    5:38 AM 06/15/2023    5:18 AM 06/14/2023    4:31 AM  CBC  WBC 4.0 - 10.5 K/uL 4.7  5.3  3.9   Hemoglobin 12.0 - 15.0  g/dL 8.7  9.6  9.3   Hematocrit 36.0 - 46.0 % 27.8  30.5  29.6   Platelets 150 - 400 K/uL 307  262  272      LOS: 6 days A FACE TO FACE EVALUATION WAS PERFORMED  Genetta Kenning 06/20/2023, 9:22 AM

## 2023-06-20 NOTE — Progress Notes (Signed)
 Occupational Therapy Session Note  Patient Details  Name: Tara Martin MRN: [id number] Date of Birth: February 27, 1941  Today's Date: 06/20/2023 OT Individual Time: 0900-1000 OT Individual Time Calculation (min): 60 min    Short Term Goals: Week 1:  OT Short Term Goal 1 (Week 1): Pt will demonstrate improved motor control of RUE to squeeze soap from bottle onto wash cloth. OT Short Term Goal 2 (Week 1): Pt will don a shirt with supervision. OT Short Term Goal 3 (Week 1): Pt will be able to ambulate from bed to toilet with CGA and only 1 cue if needed to find the toilet to demonstrate improved R visual scanning. OT Short Term Goal 4 (Week 1): Pt will be able to self feed with min A needed less than 25 % of the time.  Skilled Therapeutic Interventions/Progress Updates:    Pt received in bed and agreeable to a shower. Pt unable to recall events of this am but did recall she was in a hospital and why. Pt repeatedly asking "how many days have I been in the hospital?" Overall pt able to complete her mobility with CGA/HHA to ambulated to toilet and shower. She did well doffing clothing and showering paying attention to right side of body. She only dropped the washcloth 1x from her R hand. She continues to struggle with motor planning and perception with dressing. She needs her clothing clearing oriented for her and cues to place R side in.  Unable to fasten bra.   At sink she opened toothpaste and squeezed it but unable to accurately put paste on brush as her depth perception/ spatial awareness of the tube of paste and brush head were affected.    Pt sat in recliner and combed her hair but almost poked herself in the eye.  Pt resting in recliner with all needs met.  Alarm belt on and telesitter on.   Therapy Documentation Precautions:  Precautions Precautions: Fall Recall of Precautions/Restrictions: Impaired Precaution/Restrictions Comments: Fall, R hemi, R inattention, R visual field  neglect Restrictions Weight Bearing Restrictions Per Provider Order: No   Pain:  No c/o pain  ADL: ADL Eating: Minimal assistance Grooming: Minimal assistance Where Assessed-Grooming: Sitting at sink Upper Body Bathing: Supervision/safety, Moderate cueing Where Assessed-Upper Body Bathing: Shower Lower Body Bathing: Contact guard, Moderate cueing Where Assessed-Lower Body Bathing: Shower Upper Body Dressing: Minimal assistance, Moderate cueing Lower Body Dressing: Minimal assistance, Moderate cueing Toileting: Minimal assistance, Moderate cueing Where Assessed-Toileting: Teacher, adult education: Curator Method: Event organiser: Insurance underwriter Method: Designer, industrial/product: Emergency planning/management officer, Grab bars  Therapy/Group: Individual Therapy  Denica Web 06/20/2023, 9:18 AM

## 2023-06-20 NOTE — Telephone Encounter (Signed)
 Pharmacy Patient Advocate Encounter  Insurance verification completed.    The patient is insured through [employer]. Patient has Medicare and is not eligible for a copay card, but may be able to apply for patient assistance or Medicare RX Payment Plan (Patient Must reach out to their plan, if eligible for payment plan), if available.    Ran test claim for Lovenox 60mg  injection and the current 30 day co-pay is $115.17.   This test claim was processed through [facility]- copay amounts may vary at other pharmacies due to pharmacy/plan contracts, or as the patient moves through the different stages of their insurance plan.

## 2023-06-20 NOTE — Progress Notes (Signed)
 Occupational Therapy Session Note  Patient Details  Name: Tara Martin MRN: [id number] Date of Birth: Mar 27, 1940  Today's Date: 06/20/2023 OT Individual Time: 4098-1191 OT Individual Time Calculation (min): 72 min    Short Term Goals: Week 1:  OT Short Term Goal 1 (Week 1): Pt will demonstrate improved motor control of RUE to squeeze soap from bottle onto wash cloth. OT Short Term Goal 2 (Week 1): Pt will don a shirt with supervision. OT Short Term Goal 3 (Week 1): Pt will be able to ambulate from bed to toilet with CGA and only 1 cue if needed to find the toilet to demonstrate improved R visual scanning. OT Short Term Goal 4 (Week 1): Pt will be able to self feed with min A needed less than 25 % of the time.  Skilled Therapeutic Interventions/Progress Updates:  Pt greeted asleep in supine but easily able to arouse and pt agreeable to OT intervention.      Transfers/bed mobility/functional mobility:  Pt completed ambulatory transfer to w/c with MIN  HHA, pt seemed to be more off balance in comparision to previous notes.   Therapeutic activities/simulated IADLS:  Focused on bimanual tasks with pt instructed to pour water into cups with a focus on crossing midline and R sided attention. Pt very unsteady in R hand when pouring water with pt needing step by step cues to sequence task and locate items on R side of counter.  Pt then instructed to Organize fruits and veggies with pt completing task with 70% accuracy, pt then instructed to wash items in sink with a focus on bilateral integration pt needed step by step cues to sequence task, I.e turning on water, washing items, drying items and placing items on R side of counter.   Pt then instructed to use RUE to place all used items in dishwasher with a focus on crossing midline and visual perceptual skills. Pt completed task with CGA for balance and MOD verbal/visual cues for sequencing and technique.  Pt completed seated bimanual task with  pt using cookie cutters to create shapes of out theraputty. Pt needed MAX multimodal cues to sequence task and MOD cues needed to incorporate RUE into task appropriately.   Pt completed seated Beading task with pt having great difficulty threading beads. Pt continues to state " I can't see it" however pt was able to meet the string with the bead however pt seemed to have difficulty problem solving how to thread the bead.   Pt completed functional ambulation task with pt a focus on R sided attention with pt instructed to walk while holding cone in R hand and balancing bean bag on top of cone. Pt dropped the bean bag several times but did have awareness to dropping it.    Education:  Educated pt on the importance of bimanual tasks and using BUEs in conjunction during ADLs.   Ended session with pt seated in w/c with all needs within reach and safety belt alarm activated.                    Therapy Documentation Precautions:  Precautions Precautions: Fall Recall of Precautions/Restrictions: Impaired Precaution/Restrictions Comments: Fall, R hemi, R inattention, R visual field neglect Restrictions Weight Bearing Restrictions Per Provider Order: No  Pain: No pain    Therapy/Group: Individual Therapy  Pollyann Glen [facility] 06/20/2023, 12:22 PM

## 2023-06-21 ENCOUNTER — Ambulatory Visit (HOSPITAL_COMMUNITY): Payer: Medicare HMO

## 2023-06-21 DIAGNOSIS — H53461 Homonymous bilateral field defects, right side: Secondary | ICD-10-CM | POA: Diagnosis not present

## 2023-06-21 DIAGNOSIS — I6340 Cerebral infarction due to embolism of unspecified cerebral artery: Secondary | ICD-10-CM | POA: Diagnosis not present

## 2023-06-21 DIAGNOSIS — R278 Other lack of coordination: Secondary | ICD-10-CM | POA: Diagnosis not present

## 2023-06-21 DIAGNOSIS — G301 Alzheimer's disease with late onset: Secondary | ICD-10-CM | POA: Diagnosis not present

## 2023-06-21 LAB — CREATININE, SERUM
Creatinine, Ser: 1.08 mg/dL — ABNORMAL HIGH (ref 0.44–1.00)
GFR, Estimated: 52 mL/min — ABNORMAL LOW (ref >60)

## 2023-06-21 MED ORDER — SENNOSIDES-DOCUSATE SODIUM 8.6-50 MG PO TABS
2.0000 | ORAL_TABLET | Freq: Two times a day (BID) | ORAL | Status: DC
  Administered 2023-06-21 – 2023-06-24 (×7): 2 via ORAL
  Filled 2023-06-21 (×7): qty 2

## 2023-06-21 NOTE — Progress Notes (Signed)
 Occupational Therapy Session Note  Patient Details  Name: Tara Martin MRN: [id number] Date of Birth: 09-05-1940  Today's Date: 06/21/2023 OT Individual Time: 0930-1015 OT Individual Time Calculation (min): 45 min    Short Term Goals: Week 1:  OT Short Term Goal 1 (Week 1): Pt will demonstrate improved motor control of RUE to squeeze soap from bottle onto wash cloth. OT Short Term Goal 2 (Week 1): Pt will don a shirt with supervision. OT Short Term Goal 3 (Week 1): Pt will be able to ambulate from bed to toilet with CGA and only 1 cue if needed to find the toilet to demonstrate improved R visual scanning. OT Short Term Goal 4 (Week 1): Pt will be able to self feed with min A needed less than 25 % of the time.  Skilled Therapeutic Interventions/Progress Updates:    Pt seen this session to address visual perceptual skills. Pt ambulated to gym with CGA and mod cues for pathfinding and L/R field awareness. Pt began working on bimanual activities of holding a board in B hands and balancing a cone on the board,  carrying a laundry basket and carrying a plastic tub and moving a ball around the tub.  Pt did not drop anything when B hands were holding an object.  With single hand carry of holding a plastic egg and a then a small bead, pt did not drop the item in her hand.  When duplicated the cone carry activity she did yesterday, she held onto the upside down cone but could not maintain the steady hold and the bean bag ontop of the cone kept sliding off.   Continuing to try to decipher where and what exactly her visual impairments are. When she ambulates in busy hallways she also demonstrates a left visual impairment.   Less repetitive questions today.  Pt very relaxed today. Discussed with pt she and her spouse will need more A at home long term.  Pt participated well. Ambulated back to room and returned to recliner.  Belt alarm and telesitter on. Call light in reach.   Therapy  Documentation Precautions:  Precautions Precautions: Fall Recall of Precautions/Restrictions: Impaired Precaution/Restrictions Comments: Fall, R hemi, R inattention, R visual field neglect Restrictions Weight Bearing Restrictions Per Provider Order: No   Pain: Pain Assessment Pain Score: 0-No pain ADL: ADL Eating: Supervision/safety Grooming: Minimal assistance Where Assessed-Grooming: Standing at sink Upper Body Bathing: Minimal cueing, Supervision/safety Where Assessed-Upper Body Bathing: Shower Lower Body Bathing: Supervision/safety, Minimal cueing Where Assessed-Lower Body Bathing: Shower Upper Body Dressing: Minimal assistance, Moderate cueing Lower Body Dressing: Minimal assistance, Moderate cueing Toileting: Minimal assistance, Moderate cueing Where Assessed-Toileting: Teacher, adult education: Curator Method: Event organiser: Insurance underwriter Method: Designer, industrial/product: Emergency planning/management officer, Grab bars   Therapy/Group: Individual Therapy  Harleen Fineberg 06/21/2023, 2:03 PM

## 2023-06-21 NOTE — Progress Notes (Signed)
 PROGRESS NOTE   Subjective/Complaints:  Appreciate Onc note Recognizes me as "dr."  ROS-negative chest pain, shortness of breath, abd pain, nausea, vomiting, diarrhea  Objective:   No results found. Recent Labs    06/20/23 0538  WBC 4.7  HGB 8.7*  HCT 27.8*  PLT 307    Recent Labs    06/20/23 0538 06/21/23 0541  NA 137  --   K 4.2  --   CL 108  --   CO2 22  --   GLUCOSE 110*  --   BUN 23  --   CREATININE 1.23* 1.08*  CALCIUM 9.0  --      Intake/Output Summary (Last 24 hours) at 06/21/2023 0810 Last data filed at 06/20/2023 1824 Gross per 24 hour  Intake 596 ml  Output --  Net 596 ml        Physical Exam: Vital Signs Blood pressure (!) 141/73, pulse (!) 59, temperature 98.2 F (36.8 C), temperature source Oral, resp. rate 16, height 5\' 5"  (1.651 m), weight 59.4 kg, SpO2 96%.   General: No acute distress, resting in bed Mood and affect appropriate today Heart: Regular rate and rhythm no rubs murmurs or extra sounds Lungs: Clear to auscultation, breathing unlabored, no rales or wheezes Abdomen: Positive bowel sounds, soft nontender to palpation, nondistended Extremities: No clubbing, cyanosis, or edema Skin: No evidence of breakdown, no evidence of rash over exposed surfaces Neurologic: Cranial nerves II through XII intact, motor strength is 5/5 in bilateral deltoid, bicep, tricep, grip, hip flexor, knee extensors, ankle dorsiflexor and plantar flexor Mild dysmetria RIght FNF and Right Heel --> shin Right homonymous hemianopsia-but compensating better   Oriented to person and place but not time  Musculoskeletal: Full range of motion in all 4 extremities. No joint swelling   Assessment/Plan: 1. Functional deficits which require 3+ hours per day of interdisciplinary therapy in a comprehensive inpatient rehab setting. Physiatrist is providing close team supervision and 24 hour management of active  medical problems listed below. Physiatrist and rehab team continue to assess barriers to discharge/monitor patient progress toward functional and medical goals  Care Tool:  Bathing    Body parts bathed by patient: Right arm, Left arm, Chest, Abdomen, Right upper leg, Buttocks, Front perineal area, Left upper leg, Right lower leg, Left lower leg, Face         Bathing assist Assist Level: Contact Guard/Touching assist     Upper Body Dressing/Undressing Upper body dressing   What is the patient wearing?: Pull over shirt    Upper body assist Assist Level: Minimal Assistance - Patient > 75%    Lower Body Dressing/Undressing Lower body dressing      What is the patient wearing?: Underwear/pull up, Pants     Lower body assist Assist for lower body dressing: Minimal Assistance - Patient > 75%     Toileting Toileting    Toileting assist Assist for toileting: Contact Guard/Touching assist     Transfers Chair/bed transfer  Transfers assist     Chair/bed transfer assist level: Minimal Assistance - Patient > 75%     Locomotion Ambulation   Ambulation assist      Assist level: Minimal  Assistance - Patient > 75% Assistive device: No Device Max distance: 300+   Walk 10 feet activity   Assist     Assist level: Minimal Assistance - Patient > 75% Assistive device: No Device, Hand held assist   Walk 50 feet activity   Assist    Assist level: Minimal Assistance - Patient > 75% Assistive device: No Device, Hand held assist    Walk 150 feet activity   Assist    Assist level: Minimal Assistance - Patient > 75% Assistive device: No Device, Hand held assist    Walk 10 feet on uneven surface  activity   Assist     Assist level: Minimal Assistance - Patient > 75% Assistive device: Hand held assist   Wheelchair     Assist Is the patient using a wheelchair?: Yes Type of Wheelchair: Manual    Wheelchair assist level: Dependent - Patient 0%       Wheelchair 50 feet with 2 turns activity    Assist        Assist Level: Dependent - Patient 0%   Wheelchair 150 feet activity     Assist      Assist Level: Dependent - Patient 0%   Blood pressure (!) 141/73, pulse (!) 59, temperature 98.2 F (36.8 C), temperature source Oral, resp. rate 16, height 5\' 5"  (1.651 m), weight 59.4 kg, SpO2 96%.  Medical Problem List and Plan: 1. 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  Right homonymous hemianopsia and sensory ataxia UE and LE              -patient may  shower-  -ELOS/Goals: 12-18 days supervision due to poor memory and reduced attn to RIght side , husband is legally blind , may benefit from AL level of care - team conf in am  Will qualify for hospice eval (probably not inpt yet), consult written              -CIR PT, OT, SLP  2.  Antithrombotics: -DVT/anticoagulation:  Pharmaceutical: Lovenox 60mg  BID             -antiplatelet therapy: ASA 81mg  daily 3. Pain Management: Tylenol prn for pain.  4. Mood/Behavior/Sleep: LCSW to follow for evaluation and support.              -antipsychotic agents: N/A  -melatonin PRN, xanax 0.5mg  daily PRN 5. Neuropsych/cognition: This patient is not fully capable of making decisions on of own behalf. 6. Skin/Wound Care: Routine pressure relief measures.  7. Fluids/Electrolytes/Nutrition: Monitor intake. Routine labs, continue supplements 8. PE/DVT: probable hyper coag state due to adenocarcinoma To start treatment dose of lovenox R>L PE onset in January 2025 9. Moderate degree dementia: just dx'd Advancement past PE/hypoxia per 05/2023 visit.  --Now back on Aricept 10mg  QHS -Recent memory impaired orientation impaired, mild irritability but no agitation thus far. 10. Bronch/Biopsy of 4.4 x 2+ cm R middle lung nodule that's suspicious for cancer, cytology revealed adenocarcinoma, pulmonary med rec Onc  eval , Onc feels pt not a good candidate for chemo given co morbidities, hospice recs, order written  11. HLD- con't atorvastatin 20mg  daily 12. Constipation:  LBM 4/13- on colace may need supp if no BM by tomorrow , change colace to senna S 13. GERD/GI ppx: protonix 40mg  daily 14. Anemia:   -slightly down will check stool OB since pt on anticoag    Latest Ref Rng &  Units 06/20/2023    5:38 AM 06/15/2023    5:18 AM 06/14/2023    4:31 AM  CBC  WBC 4.0 - 10.5 K/uL 4.7  5.3  3.9   Hemoglobin 12.0 - 15.0 g/dL 8.7  9.6  9.3   Hematocrit 36.0 - 46.0 % 27.8  30.5  29.6   Platelets 150 - 400 K/uL 307  262  272      LOS: 7 days A FACE TO FACE EVALUATION WAS PERFORMED  Cecilia Coe Dyrell Tuccillo 06/21/2023, 8:10 AM

## 2023-06-21 NOTE — Progress Notes (Signed)
 Speech Language Pathology Daily Session Note  Patient Details  Name: Tara Martin MRN: [id number] Date of Birth: 09/01/1940  Today's Date: 06/21/2023 SLP Individual Time: 0802-0900; 4098-1191 SLP Individual Time Calculation (min): 58 min; 45 minutes   Short Term Goals: Week 1: SLP Short Term Goal 1 (Week 1): Patient will utilize external memory aids to recall basic orientation information with 75% accuracy given max assist. SLP Short Term Goal 2 (Week 1): Patient will utilize external aids to state date in 4/5 opportunities given max assist. SLP Short Term Goal 3 (Week 1): Patient will utilize external aids/signs to implement basic safety awareness strategies in 4/5 opportunities given max assist. SLP Short Term Goal 4 (Week 1): Patient will demonstrate safety awareness during basic problem solving tasks with 80% accuracy given max assist.  Skilled Therapeutic Interventions: Session 1: Skilled therapy session focused on cognitive goals. Upon entrance, patient consuming breakfast. SLP provided mod-maxA for patient to locate breakfast items and make eyecontact with SLP sitting on the R side. Patient perseverative on "having nightmares" throughout the session and requesting to see husband. SLP aided patient in calling husband to reduce anxiety. Patient independently recalled families names, dogs names/breeds, and activities she enjoyed before admission. Patient required mod A to recall current location and date using external aids.. Patient required maxA to interpret therapy schedule and recall how to call the nurse in case of emergency. SLP re-enforced use of external aids as reminders. Patient left in bed with alarm set and call bell in reach. Continue POC.  Session 2: Skilled therapy session focused on cognitive goals. Upon entrance, patient unable to recall activities completed this AM with ST nor other therapies despite maxA. SLP targeted R attention goals through providing mod-maxA for patient  to sequence days, months and numbers placed across table span. Patient required up to 10 minutes to complete each task and required numerous verbal and visual cues to attend to the R. Patient left in bed with alarm set and call bell in reach. Husband present. Continue POC.  Pain Session 1: None reported  Session 2: None reported   Therapy/Group: Individual Therapy  Elfego Giammarino M.A., CCC-SLP 06/21/2023, 7:37 AM

## 2023-06-21 NOTE — Progress Notes (Signed)
 Physical Therapy Session Note  Patient Details  Name: Tara Martin MRN: [id number] Date of Birth: [birth year]  Today's Date: 06/21/2023 PT Individual Time: 1306-1400 PT Individual Time Calculation (min): 54 min   Short Term Goals: Week 1:  PT Short Term Goal 1 (Week 1): pt will perform stand pivot transfer with LRAD and CGA PT Short Term Goal 2 (Week 1): pt will ambulate 150 feet with LRAD and CGA PT Short Term Goal 3 (Week 1): pt will navigate 5 steps with B HR and CGA  Skilled Therapeutic Interventions/Progress Updates: Patient sitting in recliner on entrance to room. Patient alert and agreeable to PT session.   Patient with no complaints of pain during session.   Pt required void of bladder during session. Pt ambulated back to room and provided with closed door supervision (guided to toilet with pt doffing lowerbody clothing) for privacy. When done, pt required assistance locating flushing handle, and then attempted to wash hands in toilet. Pt guided to sink with assistance required to locate soap and paper towels.  Gait Training:  Pt ambulated throughout session (around 4W and to ortho gym) using no AD with CGA/light minA during moments of R foot catching floor with VC for pt to increase step clearance on R. Pt demonstrated improved weight shift to L this session with notable difference in ability to maintain R front of shoe from catching the floor. Pt with noted gait deviations to L and R with ability to maintain standing balance.   Neuromuscular Re-ed: NMR facilitated during session with focus on ambulatory balance, weight shifting, spatial awareness, coordination. - Ambulating around nsg/day room loop with cues to cone cones placed around unit. Pt also with 5lb ankle weight on L LE in order to promote weight shift to L. Pt required max cuing and hints to locate cones, often times looking directly over cone. Pt with CGA/light minA to maintain standing balance, and to safely  navigate hallway obstacles. Pt with seated rest break, then ambulated around 4W with CGA and instructions to locate items that are a specific color called out by therapist (required mod cuing to locate items - 5lb ankle weight still donned on L LE).  - BITS system with pt instructed to follow along lines. Buffer set to 2 with pt often times over shooting line (bottom portion of shape greater than lateral or top).   NMR performed for improvements in motor control and coordination, balance, sequencing, judgement, and self confidence/ efficacy in performing all aspects of mobility at highest level of independence.   Patient sitting EOB at end of session with brakes locked, bed alarm set, husband present and all needs within reach.      Therapy Documentation Precautions:  Precautions Precautions: Fall Recall of Precautions/Restrictions: Impaired Precaution/Restrictions Comments: Fall, R hemi, R inattention, R visual field neglect Restrictions Weight Bearing Restrictions Per Provider Order: No  Therapy/Group: Individual Therapy  Sayde Lish PTA 06/21/2023, 4:03 PM

## 2023-06-22 DIAGNOSIS — H53461 Homonymous bilateral field defects, right side: Secondary | ICD-10-CM | POA: Diagnosis not present

## 2023-06-22 DIAGNOSIS — R278 Other lack of coordination: Secondary | ICD-10-CM | POA: Diagnosis not present

## 2023-06-22 DIAGNOSIS — I6340 Cerebral infarction due to embolism of unspecified cerebral artery: Secondary | ICD-10-CM | POA: Diagnosis not present

## 2023-06-22 DIAGNOSIS — Z515 Encounter for palliative care: Secondary | ICD-10-CM

## 2023-06-22 DIAGNOSIS — Z7189 Other specified counseling: Secondary | ICD-10-CM

## 2023-06-22 DIAGNOSIS — G301 Alzheimer's disease with late onset: Secondary | ICD-10-CM | POA: Diagnosis not present

## 2023-06-22 DIAGNOSIS — I6349 Cerebral infarction due to embolism of other cerebral artery: Secondary | ICD-10-CM

## 2023-06-22 NOTE — Progress Notes (Signed)
 Occupational Therapy Session Note  Patient Details  Name: Tara Martin MRN: [id number] Date of Birth: 1940-05-15  Today's Date: 06/22/2023 OT Individual Time: 8295-6213 OT Individual Time Calculation (min): 45 min    Short Term Goals: Week 1:  OT Short Term Goal 1 (Week 1): Pt will demonstrate improved motor control of RUE to squeeze soap from bottle onto wash cloth. OT Short Term Goal 2 (Week 1): Pt will don a shirt with supervision. OT Short Term Goal 3 (Week 1): Pt will be able to ambulate from bed to toilet with CGA and only 1 cue if needed to find the toilet to demonstrate improved R visual scanning. OT Short Term Goal 4 (Week 1): Pt will be able to self feed with min A needed less than 25 % of the time.  Skilled Therapeutic Interventions/Progress Updates:      Pt seen for BADL retraining of toileting, bathing, and dressing with a focus on perceptual skills and awareness of environment and body awareness.  Pt received in bed and popped up to the side of the bed as I was entering. Pt stated "oh I have been WAITING to go to the bathroom"  Pt stood and ambulated with supervision and cues to toilet.  She doffed underwear but had a small amount of bowel leakage in underwear.  Pt continued to toilet.  Pt self cleansed with cues and then assist to cleanse her hand thoroughly after she wiped her bottom.  Cues to doff all clothing with supervision and min A to unfasten bra.    Pt stepped into shower and was able to bathe with close supervision but frequent cues to sit to wash feet.  She continues to need A to open shampoo bottle, squeeze into hands as pt is not able to motor plan that process or see where her palm is in relation to the bottle in the other hand.  Her balance is improving significantly and pt was able to stand with 1 hand support and lift 1 foot at a time.    Pt needed several cues to sit on toilet to don  clothing over feet vs standing. Pt needed mod A to orient underwear over  feet but was able to do her jeans and even fastened the large button.  Mod A to don bra and button up shirt.   Pt cued to stand at sink to comb hair needing min A.  Brushing teeth continues to need A with opening toothpaste and applying it to brush. Able to stand at sink for several minutes with no LOB.  Asked pt to look straight in the mirror and tell me how clear her image was to her. Pt states it is clear, but her eyes were not fully focused on her image.   It has been challenging deciphering what exactly are her visual impairments, regardless pt has been needing cues and A due to limited spatial awareness, depth perception.    Pt resting in w/c at end of session.  Belt alarm on and telesitter on.    Therapy Documentation Precautions:  Precautions Precautions: Fall Recall of Precautions/Restrictions: Impaired Precaution/Restrictions Comments: Fall, R hemi, R inattention, R visual field neglect Restrictions Weight Bearing Restrictions Per Provider Order: No   Pain: Pain Assessment Pain Score: 0-No pain ADL: ADL Eating: Supervision/safety Grooming: Minimal assistance Where Assessed-Grooming: Standing at sink Upper Body Bathing: Minimal cueing, Supervision/safety Where Assessed-Upper Body Bathing: Shower Lower Body Bathing: Supervision/safety, Minimal cueing Where Assessed-Lower Body Bathing: Shower Upper Body Dressing:  Minimal assistance, Moderate cueing Lower Body Dressing: Minimal assistance, Moderate cueing Toileting: Supervision/safety, Moderate assistance Where Assessed-Toileting: Teacher, adult education: Close supervision Statistician Method: Event organiser: Close supervision Film/video editor Method: Designer, industrial/product: Emergency planning/management officer, Grab bars    Therapy/Group: Individual Therapy  Juwuan Sedita 06/22/2023, 10:09 AM

## 2023-06-22 NOTE — Progress Notes (Signed)
 Patient ID: Tara Martin, female   DOB: 1940-10-08, 83 y.o.   MRN: [id number]  SW went by pt room to provide updates but husband on the phone. SW will follow-up.  Norval Been, MSW, LCSW Office: [phone] Cell: [phone] Fax: [phone]

## 2023-06-22 NOTE — Plan of Care (Signed)
  Problem: RH Dressing Goal: LTG Patient will perform upper body dressing (OT) Description: LTG Patient will perform upper body dressing with assist, with/without cues (OT). Flowsheets (Taken 06/22/2023 1252) LTG: Pt will perform upper body dressing with assistance level of: (LTG downgraded due to visual deficits and R hand coordination deficits.) Minimal Assistance - Patient > 75% Note: LTG downgraded due to visual deficits and R hand coordination deficits.  Goal: LTG Patient will perform lower body dressing w/assist (OT) Description: LTG: Patient will perform lower body dressing with assist, with/without cues in positioning using equipment (OT) Flowsheets (Taken 06/22/2023 1252) LTG: Pt will perform lower body dressing with assistance level of: (LTG downgraded due to visual deficits and R hand coordination deficits.) Minimal Assistance - Patient > 75% Note: LTG downgraded due to visual deficits and R hand coordination deficits.    Problem: RH Toileting Goal: LTG Patient will perform toileting task (3/3 steps) with assistance level (OT) Description: LTG: Patient will perform toileting task (3/3 steps) with assistance level (OT)  Flowsheets (Taken 06/22/2023 1252) LTG: Pt will perform toileting task (3/3 steps) with assistance level: (LTG downgraded due to visual deficits and R hand coordination deficits.) Minimal Assistance - Patient > 75% Note: LTG downgraded due to visual deficits and R hand coordination deficits.

## 2023-06-22 NOTE — Progress Notes (Signed)
 Physical Therapy Session Note  Patient Details  Name: Tara Martin MRN: [id number] Date of Birth: 1940/04/07  Today's Date: 06/22/2023 PT Individual Time: 1120-1200 PT Individual Time Calculation (min): 40 min   Short Term Goals: Week 1:  PT Short Term Goal 1 (Week 1): pt will perform stand pivot transfer with LRAD and CGA PT Short Term Goal 2 (Week 1): pt will ambulate 150 feet with LRAD and CGA PT Short Term Goal 3 (Week 1): pt will navigate 5 steps with B HR and CGA  Skilled Therapeutic Interventions/Progress Updates: Patient sitting in WC on entrance to room. Patient alert and agreeable to PT session.   Patient with no complaints of pain during session.  Gait Training:  Pt ambulated from room<>main gym using no AD with overall CGA and improved weight shift to L, leading to increase in step clearance on R LE vs previous sessions with this PTA. Pt required cuing throughout to increase environmental awareness to avoid bumping into objects.   Neuromuscular Re-ed: NMR facilitated during session with focus on spatial awareness . - navigating open spaces on floor ladder. Originally attempted lateral steps without touching barriers, but pt unable to stay on task without max cuing to maintain facing wall to promote lateral stepping vs hip flexor compensation, and to avoid touching barriers. Pt then cued to faced ladder directly and anteriorly step without touching barriers (step-to pattern vs reciprocal). Pt able to perform task with max cues to maintain sequence and barrier avoidance. CGA throughout.  - Navigating 4 (6") hurdles. Pt required minA on first round and knocked over every hurdle with poor spatial awareness (down and back) with VC to step close to ladder and to maintain standing balance prior to advance next LE. Pt with seated rest break and performed another round without knocking any over, and only lightly touched 2 hurdles (down and back) with improved standing balance with  overall CGA/light minA. Next round, pt performed with same level of assistance without knocking any over (starting with R LE). On way back down, pt cued to start with L LE and required minA to prevent LOB with pt demonstrating decrease in spatial awareness, often times attempting to step over when not near hurdle with L LE turning into external rotation when stepping over leading to narrow BOS.   NMR performed for improvements in motor control and coordination, balance, sequencing, judgement, and self confidence/ efficacy in performing all aspects of mobility at highest level of independence.   Patient sitting in WC at end of session with brakes locked, belt alarm set, and all needs within reach.      Therapy Documentation Precautions:  Precautions Precautions: Fall Recall of Precautions/Restrictions: Impaired Precaution/Restrictions Comments: Fall, R hemi, R inattention, R visual field neglect Restrictions Weight Bearing Restrictions Per Provider Order: No  Therapy/Group: Individual Therapy  Majorie Santee PTA 06/22/2023, 12:07 PM

## 2023-06-22 NOTE — Consult Note (Signed)
 Consultation Note Date: 06/22/2023   Patient Name: Tara Martin  DOB: [birth year]  MRN: [id number]  Age / Sex: 83 y.o., female  PCP: Kristian Covey, MD Referring Physician: Erick Colace, MD  Reason for Consultation: Establishing goals of care  HPI/Patient Profile: 83 y.o. female  with past medical history of anxiety d/o, migraines, lupus, Dx of RML lung mass, DVT and bilateral PE's 03/2023-on eliquis, late onset Alzheimer's with advancement (since Jan) admitted on 06/14/2023 with MS changes, difficulty speaking and weakness.   In the ED, CTA head/neck showed progression in bulky metastatic lymphadenopathy in mediastinum and bilateral lower neck/thoracic inlet, severe stenosis concordant with acute ischemia (multifocal) seen in MRI.   Patient with newly diagnosed adenocarcinoma of lung. Clinically at least stage III. Oncology met with spouse and recommended hospice referral.  PMT has been consulted to assist with goals of care conversation.  Clinical Assessment and Goals of Care:  I have reviewed medical records including EPIC notes, labs and imaging, assessed the patient and then had a phone conversation with patient's husband Tara Martin to discuss diagnosis prognosis, GOC, EOL wishes, disposition and options.  I introduced Palliative Medicine as specialized medical care for people living with serious illness. It focuses on providing relief from the symptoms and stress of a serious illness. The goal is to improve quality of life for both the patient and the family.  We discussed a brief life review of the patient and then focused on their current illness.  The natural disease trajectory and expectations at EOL were discussed.  I attempted to elicit values and goals of care important to the patient.    Medical History Review and Understanding:  Patient is not very sure about why she is here, what is being done for her.  Patient's husband has a good  understanding of the severity of her illness after discussion with oncology.  Social History: Patient is married.  Her husband is her primary caregiver and blind. She has 1 daughter who lives in [place].  She shares her goal for her grandchildren with family.  Palliative Symptoms: Confusion, fatigue  Code Status: Concepts specific to code status, artifical feeding and hydration, and rehospitalization were considered and discussed. Recommended consideration of DNR status, understanding evidenced-based poor outcomes in similar hospitalized patients, as the cause of the arrest is likely associated with chronic/terminal disease rather than a reversible acute cardio-pulmonary event.   Discussion: After discussion of the difference between palliative care and hospice, patient's husband shares he is interested in hospice philosophy and comfort focused care when she returns home.  His sole priority is managing her symptoms and not causing her any more pain and suffering.  We discussed the hospice resources available in different settings including home and in SNF.  He has applied for some caregiver assistance, though he states this is a 12-week wait.  He would like to take her home as soon as possible, as she has been saying how much she dislikes sleeping alone.  He feels it would be feasible to take care of her in her current condition and he will have to "make do" until more help is arranged.   The difference between aggressive medical intervention and comfort care was considered in light of the patient's goals of care. Hospice and Palliative Care services outpatient were explained and offered.   Discussed the importance of continued conversation with family and the medical providers regarding overall plan of care and treatment options, ensuring decisions are within the context of  the patient's values and GOCs.   Questions and concerns were addressed.  Hard Choices booklet left for review. The family  was encouraged to call with questions or concerns.  PMT will continue to support holistically.   SUMMARY OF RECOMMENDATIONS   - CODE STATUS changed to DNR-Limited after discussion with patient's husband.  Will sign form and placed in patient's chart.  Scanned copy into EMR - Goal is for return home with hospice as soon as possible.  TOC consulted for assistance with hospice referral and offering choices -Continue current care for now while arranging for hospice referral.  Patient is enjoying rehab but would not want to delay return home once arrangements have been finalized -Psychosocial and emotional support provided -PMT remains available as needed  Prognosis:  < 6 months  Discharge Planning: To Be Determined      Primary Diagnoses: Present on Admission:  Embolic stroke [facility])   Physical Exam Vitals and nursing note reviewed.  Constitutional:      General: She is not in acute distress. Cardiovascular:     Rate and Rhythm: Normal rate.  Pulmonary:     Effort: Pulmonary effort is normal.  Neurological:     Mental Status: She is alert. She is disoriented and confused.     Motor: Weakness present.  Psychiatric:        Mood and Affect: Mood normal.        Behavior: Behavior normal.        Cognition and Memory: Cognition is impaired.     Vital Signs: BP (!) 119/49 (BP Location: Right Arm)   Pulse 60   Temp 98.2 F (36.8 C) (Oral)   Resp 16   Ht 5\' 5"  (1.651 m)   Wt 59.4 kg   SpO2 96%   BMI 21.79 kg/m  Pain Scale: 0-10   Pain Score: 0-No pain   SpO2: SpO2: 96 % O2 Device:SpO2: 96 % O2 Flow Rate: .    MDM: High   Yandel Zeiner Alroy Jericho, PA-C  Palliative Medicine Team Team phone # [phone]  Thank you for allowing the Palliative Medicine Team to assist in the care of this patient. Please utilize secure chat with additional questions, if there is no response within 30 minutes please call the above phone number.  Palliative Medicine Team providers are  available by phone from 7am to 7pm daily and can be reached through the team cell phone.  Should this patient require assistance outside of these hours, please call the patient's attending physician.

## 2023-06-22 NOTE — Progress Notes (Signed)
 I cannot specify if anticoagulation has contributed or not to her hemorrhagic conversion, especially that her acute ischemic event happened while she is on anticoagulation

## 2023-06-22 NOTE — Patient Care Conference (Cosign Needed Addendum)
 Inpatient RehabilitationTeam Conference and Plan of Care Update Date: 06/22/2023   Time: 10:11 AM    Patient Name: Tara Martin      Medical Record Number: [id number]  Date of Birth: 25-Sep-1940 Sex: Female         Room/Bed: 4W17C/4W17C-01 Payor Info: Payor: Raina Bunting MEDICARE / Plan: Raina Bunting MEDICARE HMO/PPO / Product Type: *No Product type* /    Admit Date/Time:  06/14/2023  6:18 PM  Primary Diagnosis:  Embolic stroke [facility])  Hospital Problems: Principal Problem:   Embolic stroke [facility])    Expected Discharge Date: Expected Discharge Date: 06/28/23  Team Members Present: Physician leading conference: Dr. Janeece Mechanic Social Worker Present: Norval Been, LCSWA Nurse Present: Forrestine Ike, RN PT Present: Oma Bias, PT;Dominic Sandoval, PTA OT Present: Kenda Paula, OT SLP Present: Reggie Caper, SLP PPS Coordinator present : Jestine Moron, SLP     Current Status/Progress Goal Weekly Team Focus  Bowel/Bladder   Pt is continent of bowel/bladder   Pt to remain continent of bowel/bladder   Will assess qshift and PRN    Swallow/Nutrition/ Hydration               ADL's   mod A overall with dressing, CGA to min bathing, mod toileting, CGA with transfers, impaired field deficits, visual acuity, spatial relations   supervision goals, but will likely need to be downgraded to min A due to visual perceptual and cognitive deficits   pt /family education on long term care planning as pt will need hands on visual assist,  visual perceptual skills,    Mobility   Supervision overall with bed mobility, and CGA for transfers, and CGA/light minA ambulation (Weight shift to L has improved, leading to increased step clearance on R). CGA stair navigation. Poor spatial awareness  Supervision  Barriers - LOC provided at d/c? Focus - family/pt education, dynamic standing balance, spatial awareness    Communication                Safety/Cognition/ Behavioral Observations   mod-maxA   modA   R attention, orientation, awareness, basic memory    Pain   Pt denies pain   Pt will continue to deny pain   Will assess qshift and PRN    Skin   Pt's skin is intact   Pt's skin will remain intact  Will assess qshift and PRN      Discharge Planning:  Pt will have support from her husband. Dtr to assist first few days after discharge and then will return to MD. Pt has new diagnosis of cancer, and husband reports he would like to d/c to home with palliative care.  SW will confirm there are no barriers to discharge.   Team Discussion: Patient post embolic stroke with visual field cut and right inattention with perceptual deficits and spatial deficits. Patient on target to meet rehab goals: yes, currently needs moderate cues for activities/self care and toileting. Balance is better but needs min - mod assist for dressing and max assist for attention due to STMD, poor retention of information.  *See Care Plan and progress notes for long and short-term goals.   Revisions to Treatment Plan:  Palliative care consult Hospice Care consult Transition to another medication other than Lovenox for hyper-coagulable state for discharge; unable to self administer Lovenox and spouse is legally blind and unable to administer medication  Teaching Needs: Safety, cues for activities, medications, transfers, toileting, etc.   Current Barriers to Discharge: Decreased caregiver support and Home  enviroment access/layout  Possible Resolutions to Barriers: Family education     Medical Summary Current Status: Lung mass adenocarcinoma, not good candidate for chemo due to underlying comorbidities  Barriers to Discharge: Pending chemo/radiation   Possible Resolutions to [employer] Focus: No chemo or radiation planned unless improvement in cognitive status.  Will likely need oncology as well as hospice follow-up   Continued Need for Acute Rehabilitation Level of Care: The  patient requires daily medical management by a physician with specialized training in physical medicine and rehabilitation for the following reasons: Direction of a multidisciplinary physical rehabilitation program to maximize functional independence : Yes Medical management of patient stability for increased activity during participation in an intensive rehabilitation regime.: Yes Analysis of laboratory values and/or radiology reports with any subsequent need for medication adjustment and/or medical intervention. : Yes   I attest that I was present, lead the team conference, and concur with the assessment and plan of the team.   Forrestine Ike B 06/22/2023, 1:31 PM

## 2023-06-22 NOTE — Progress Notes (Signed)
 Occupational Therapy Weekly Progress Note  Patient Details  Name: LEXIANNA WEINRICH MRN: [id number] Date of Birth: 12-Feb-1941  Beginning of progress report period: June 15, 2023 End of progress report period: June 22, 2023   Patient has met 2 of 4 short term goals.  Pt is making some progress with compensating for R visual field loss by turning her head or scanning her eyes to the R but overall her visual perceptual deficits are continuing to cause her to need mod A with dressing and toileting skills.    Patient continues to demonstrate the following deficits: decreased visual perceptual skills, decreased visual motor skills, and hemianopsia, decreased attention to right and decreased motor planning, decreased awareness, decreased problem solving, and decreased memory, and decreased standing balance and hemiplegia and therefore will continue to benefit from skilled OT intervention to enhance overall performance with BADL.  Patient progressing toward long term goals..  Plan of care revisions: LTGs for dressing and toileting downgraded to min A.  Family training with her spouse will need to be started this week as spouse is also visually impaired.  Unsure if he can assist or cue her to the capacity she needs to be cued.    OT Short Term Goals Week 1:  OT Short Term Goal 1 (Week 1): Pt will demonstrate improved motor control of RUE to squeeze soap from bottle onto wash cloth. OT Short Term Goal 1 - Progress (Week 1): Progressing toward goal OT Short Term Goal 2 (Week 1): Pt will don a shirt with supervision. OT Short Term Goal 2 - Progress (Week 1): Progressing toward goal OT Short Term Goal 3 (Week 1): Pt will be able to ambulate from bed to toilet with CGA and only 1 cue if needed to find the toilet to demonstrate improved R visual scanning. OT Short Term Goal 3 - Progress (Week 1): Met OT Short Term Goal 4 (Week 1): Pt will be able to self feed with min A needed less than 25 % of the  time. OT Short Term Goal 4 - Progress (Week 1): Met Week 2:  OT Short Term Goal 1 (Week 2): STGs = LTGs   Therapy Documentation Precautions:  Precautions Precautions: Fall Recall of Precautions/Restrictions: Impaired Precaution/Restrictions Comments: Fall, R hemi, R inattention, R visual field neglect Restrictions Weight Bearing Restrictions Per Provider Order: No   Pain: Pain Assessment Pain Score: 0-No pain ADL: ADL Eating: Supervision/safety Grooming: Minimal assistance Where Assessed-Grooming: Standing at sink Upper Body Bathing: Minimal cueing, Supervision/safety Where Assessed-Upper Body Bathing: Shower Lower Body Bathing: Supervision/safety, Minimal cueing Where Assessed-Lower Body Bathing: Shower Upper Body Dressing: Minimal assistance, Moderate cueing Lower Body Dressing: Minimal assistance, Moderate cueing Toileting: Supervision/safety, Moderate assistance Where Assessed-Toileting: Teacher, adult education: Close supervision Statistician Method: Event organiser: Close supervision Film/video editor Method: Designer, industrial/product: Emergency planning/management officer, Grab bars  Sharvil Hoey 06/22/2023, 12:55 PM

## 2023-06-22 NOTE — Progress Notes (Signed)
 Speech Language Pathology Weekly Progress and Session Note  Patient Details  Name: Tara Martin MRN: [id number] Date of Birth: 09-12-1940  Beginning of progress report period: June 15, 2023 End of progress report period: June 22, 2023  Today's Date: 06/22/2023 SLP Individual Time: 1300-1400 SLP Individual Time Calculation (min): 60 min  Short Term Goals: Week 1: SLP Short Term Goal 1 (Week 1): Patient will utilize external memory aids to recall basic orientation information with 75% accuracy given max assist. SLP Short Term Goal 1 - Progress (Week 1): Met SLP Short Term Goal 2 (Week 1): Patient will utilize external aids to state date in 4/5 opportunities given max assist. SLP Short Term Goal 2 - Progress (Week 1): Met SLP Short Term Goal 3 (Week 1): Patient will utilize external aids/signs to implement basic safety awareness strategies in 4/5 opportunities given max assist. SLP Short Term Goal 3 - Progress (Week 1): Met SLP Short Term Goal 4 (Week 1): Patient will demonstrate safety awareness during basic problem solving tasks with 80% accuracy given max assist. SLP Short Term Goal 4 - Progress (Week 1): Met   New Short Term Goals: Week 2: STG = LTG due to ELOS  Weekly Progress Updates: Pt has made good gains and has met 4 of 4 STGs this reporting period due to improved cognitive skills. Currently, patient continues to require max A for orientation to date, location and age. Patient requires maxA to demonstrate safety awareness during basic problem solving tasks such as use of call bell. Patient with dx of dementia impacting performance.  Pt/family eduction ongoing. Pt would benefit from continued ST intervention to maximize cognition in order to maximize functional independence at d/c.    Intensity: Minumum of 1-2 x/day, 30 to 90 minutes Frequency: 3 to 5 out of 7 days Duration/Length of Stay: 4/22 Treatment/Interventions: Cognitive remediation/compensation;Environmental  controls;Cueing hierarchy;Functional tasks;Therapeutic Activities;Internal/external aids   Daily Session  Skilled Therapeutic Interventions:  Skilled therapy session focused on cognitive goals. SLP facilitated session by providing maxA for patient to utilize external aids to identify current date, location, and age. Patient continued to require maxA throughout the session to recall orientation after several minutes due to significant deficits in memory. SLP targeted safety awareness goals through verbalizing problem solving scenarios. Patient able to accurately identify solutions to basic problems with minA, however required maxA to complete action (ex. Finding and using the call bell). Patient left in chair with alarm set and call bell in reach. Continue POC.       Pain None reported   Therapy/Group: Individual Therapy  Ahmeer Tuman M.A., CCC-SLP 06/22/2023, 3:38 PM

## 2023-06-22 NOTE — Progress Notes (Signed)
 PROGRESS NOTE   Subjective/Complaints:  Pt unaware of surrounding "in my apt" but recognizes me as MD  ROS-negative chest pain, shortness of breath, abd pain, nausea, vomiting, diarrhea  Objective:   No results found. Recent Labs    06/20/23 0538  WBC 4.7  HGB 8.7*  HCT 27.8*  PLT 307    Recent Labs    06/20/23 0538 06/21/23 0541  NA 137  --   K 4.2  --   CL 108  --   CO2 22  --   GLUCOSE 110*  --   BUN 23  --   CREATININE 1.23* 1.08*  CALCIUM 9.0  --      Intake/Output Summary (Last 24 hours) at 06/22/2023 0913 Last data filed at 06/22/2023 0740 Gross per 24 hour  Intake 354 ml  Output --  Net 354 ml        Physical Exam: Vital Signs Blood pressure (!) 119/49, pulse 60, temperature 98.2 F (36.8 C), temperature source Oral, resp. rate 16, height 5\' 5"  (1.651 m), weight 59.4 kg, SpO2 96%.   General: No acute distress, resting in bed Mood and affect appropriate today Heart: Regular rate and rhythm no rubs murmurs or extra sounds Lungs: Clear to auscultation, breathing unlabored, no rales or wheezes Abdomen: Positive bowel sounds, soft nontender to palpation, nondistended Extremities: No clubbing, cyanosis, or edema Skin: No evidence of breakdown, no evidence of rash over exposed surfaces Neurologic: Cranial nerves II through XII intact, motor strength is 5/5 in bilateral deltoid, bicep, tricep, grip, hip flexor, knee extensors, ankle dorsiflexor and plantar flexor  Right homonymous hemianopsia-may be improving   Oriented to person and place but not time  Musculoskeletal: Full range of motion in all 4 extremities. No joint swelling   Assessment/Plan: 1. Functional deficits which require 3+ hours per day of interdisciplinary therapy in a comprehensive inpatient rehab setting. Physiatrist is providing close team supervision and 24 hour management of active medical problems listed  below. Physiatrist and rehab team continue to assess barriers to discharge/monitor patient progress toward functional and medical goals  Care Tool:  Bathing    Body parts bathed by patient: Right arm, Left arm, Chest, Abdomen, Right upper leg, Buttocks, Front perineal area, Left upper leg, Right lower leg, Left lower leg, Face         Bathing assist Assist Level: Contact Guard/Touching assist     Upper Body Dressing/Undressing Upper body dressing   What is the patient wearing?: Pull over shirt    Upper body assist Assist Level: Minimal Assistance - Patient > 75%    Lower Body Dressing/Undressing Lower body dressing      What is the patient wearing?: Underwear/pull up, Pants     Lower body assist Assist for lower body dressing: Minimal Assistance - Patient > 75%     Toileting Toileting    Toileting assist Assist for toileting: Contact Guard/Touching assist     Transfers Chair/bed transfer  Transfers assist     Chair/bed transfer assist level: Minimal Assistance - Patient > 75%     Locomotion Ambulation   Ambulation assist      Assist level: Minimal Assistance - Patient >  75% Assistive device: No Device Max distance: 300+   Walk 10 feet activity   Assist     Assist level: Minimal Assistance - Patient > 75% Assistive device: No Device, Hand held assist   Walk 50 feet activity   Assist    Assist level: Minimal Assistance - Patient > 75% Assistive device: No Device, Hand held assist    Walk 150 feet activity   Assist    Assist level: Minimal Assistance - Patient > 75% Assistive device: No Device, Hand held assist    Walk 10 feet on uneven surface  activity   Assist     Assist level: Minimal Assistance - Patient > 75% Assistive device: Hand held assist   Wheelchair     Assist Is the patient using a wheelchair?: Yes Type of Wheelchair: Manual    Wheelchair assist level: Dependent - Patient 0%      Wheelchair 50 feet  with 2 turns activity    Assist        Assist Level: Dependent - Patient 0%   Wheelchair 150 feet activity     Assist      Assist Level: Dependent - Patient 0%   Blood pressure (!) 119/49, pulse 60, temperature 98.2 F (36.8 C), temperature source Oral, resp. rate 16, height 5\' 5"  (1.651 m), weight 59.4 kg, SpO2 96%.  Medical Problem List and Plan: 1. 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  Right homonymous hemianopsia and sensory ataxia UE and LE              -patient may  shower-  -ELOS/Goals: 12-18 days supervision due to poor memory and reduced attn to RIght side , husband is legally blind , may benefit from AL level of care - team conf in am  Will qualify for hospice eval (probably not inpt yet), consult written              -CIR PT, OT, SLP  Team conference today please see physician documentation under team conference tab, met with team  to discuss problems,progress, and goals. Formulized individual treatment plan based on medical history, underlying problem and comorbidities.  2.  Antithrombotics: -DVT/anticoagulation:  Pharmaceutical: Lovenox 60mg  BID             -antiplatelet therapy: ASA 81mg  daily 3. Pain Management: Tylenol prn for pain.  4. Mood/Behavior/Sleep: LCSW to follow for evaluation and support.              -antipsychotic agents: N/A  -melatonin PRN, xanax 0.5mg  daily PRN 5. Neuropsych/cognition: This patient is not fully capable of making decisions on of own behalf. 6. Skin/Wound Care: Routine pressure relief measures.  7. Fluids/Electrolytes/Nutrition: Monitor intake. Routine labs, continue supplements 8. PE/DVT: probable hyper coag state due to adenocarcinoma To start treatment dose of lovenox R>L PE onset in January 2025 9. Moderate degree dementia: just dx'd Advancement past PE/hypoxia per 05/2023 visit.  --Now back on Aricept 10mg  QHS -Recent memory  impaired orientation impaired, mild irritability but no agitation thus far. 10. Bronch/Biopsy of 4.4 x 2+ cm R middle lung nodule that's suspicious for cancer, cytology revealed adenocarcinoma, pulmonary med rec Onc eval , Onc feels pt not a good candidate for chemo given co morbidities, hospice recs, order written  11. HLD- con't atorvastatin 20mg  daily 12. Constipation:  LBM 4/13- on colace may need supp if no BM by tomorrow , change colace to senna  S 13. GERD/GI ppx: protonix 40mg  daily 14. Anemia:   -slightly down will check stool OB since pt on anticoag    Latest Ref Rng & Units 06/20/2023    5:38 AM 06/15/2023    5:18 AM 06/14/2023    4:31 AM  CBC  WBC 4.0 - 10.5 K/uL 4.7  5.3  3.9   Hemoglobin 12.0 - 15.0 g/dL 8.7  9.6  9.3   Hematocrit 36.0 - 46.0 % 27.8  30.5  29.6   Platelets 150 - 400 K/uL 307  262  272      LOS: 8 days A FACE TO FACE EVALUATION WAS PERFORMED  Tara Martin 06/22/2023, 9:13 AM

## 2023-06-22 NOTE — Progress Notes (Signed)
 Occupational Therapy Session Note  Patient Details  Name: Tara Martin MRN: [id number] Date of Birth: 1940/03/19  Today's Date: 06/22/2023 OT Individual Time: 1401-1500 OT Individual Time Calculation (min): 59 min    Short Term Goals: Week 2:  OT Short Term Goal 1 (Week 2): STGs = LTGs  Skilled Therapeutic Interventions/Progress Updates:  Pt greeted seated in w/c, pt agreeable to OT intervention.    Pts husband present during session, did assist husband with phone call as pt unable to dial phone number d/t visual deficits.   Transfers/bed mobility/functional mobility: pt completed all functional ambulation with no AD and CGA. MAX multimodal cues needed for directions and attention to R side.   Therapeutic activity: pt completed various therapeutic activities with a focus on RUE motor planning, visual scanning, R sided attention and dynamic standing balance.   - pt instructed to match colored rings to colored squigz on mirror to challenge RUE motor planning and visual scanning. Pt needed MAX multimodal cues to complete task. Pt noted to reach for items on mirror that weren't actually there and had great difficulty only using RUE. Overall, pt completed task with 60% accuracy. Pt has no awareness to deficit stating, " that was easy."  -pt completed functional ambulation task with instructed to hold lid of box with ping pong ball placed in box and pt instructed to roll ball along perimeter of box. Pt completed this task in standing with no issues noted. Graded task up and had pt ambulate while holding tray with ball in it. Pt able to keep tray level enough to not allow the ball to fall out.  -attempted to have pt ambulate while balancing cone in tray with a focus on R sided attention. Pt dropped the cone multiple times but did have awareness to deficit.   ADLs:  Grooming: pt completed standing oral care with pt attempting to apply no rinse spray on toothbrush, pt needed MAX A to apply paste  and MAX cues to problem solve turing on water. Pt also completed hand hygiene at sink with pt getting her sleeves wet when reaching for items at sink with no awareness.     Transfers: ambulatory toilet transfer to bathroom with no AD and CGA, MIN verbal/visual cues for locating the bathroom.  Toileting: pt with continent b/b void completing 3/3 toileteing tasks with CGA.    Ended session with pt supine in bed with all needs within reach and bed alarm activated.                    Therapy Documentation Precautions:  Precautions Precautions: Fall Recall of Precautions/Restrictions: Impaired Precaution/Restrictions Comments: Fall, R hemi, R inattention, R visual field neglect Restrictions Weight Bearing Restrictions Per Provider Order: No  Pain: No pain    Therapy/Group: Individual Therapy  Mollie Anger [facility] 06/22/2023, 3:08 PM

## 2023-06-23 ENCOUNTER — Other Ambulatory Visit: Payer: Self-pay

## 2023-06-23 LAB — CBC
HCT: 28.2 % — ABNORMAL LOW (ref 36.0–46.0)
Hemoglobin: 8.7 g/dL — ABNORMAL LOW (ref 12.0–15.0)
MCH: 25.4 pg — ABNORMAL LOW (ref 26.0–34.0)
MCHC: 30.9 g/dL (ref 30.0–36.0)
MCV: 82.5 fL (ref 80.0–100.0)
Platelets: 292 10*3/uL (ref 150–400)
RBC: 3.42 MIL/uL — ABNORMAL LOW (ref 3.87–5.11)
RDW: 17.1 % — ABNORMAL HIGH (ref 11.5–15.5)
WBC: 5.5 10*3/uL (ref 4.0–10.5)
nRBC: 0 % (ref 0.0–0.2)

## 2023-06-23 LAB — BASIC METABOLIC PANEL WITH GFR
Anion gap: 7 (ref 5–15)
BUN: 26 mg/dL — ABNORMAL HIGH (ref 8–23)
CO2: 21 mmol/L — ABNORMAL LOW (ref 22–32)
Calcium: 9.1 mg/dL (ref 8.9–10.3)
Chloride: 110 mmol/L (ref 98–111)
Creatinine, Ser: 1.02 mg/dL — ABNORMAL HIGH (ref 0.44–1.00)
GFR, Estimated: 55 mL/min — ABNORMAL LOW (ref >60)
Glucose, Bld: 115 mg/dL — ABNORMAL HIGH (ref 70–99)
Potassium: 3.8 mmol/L (ref 3.5–5.1)
Sodium: 138 mmol/L (ref 135–145)

## 2023-06-23 MED ORDER — POLYETHYLENE GLYCOL 3350 17 GM/SCOOP PO POWD
17.0000 g | Freq: Every day | ORAL | 0 refills | Status: DC
  Filled 2023-06-23: qty 238, 14d supply, fill #0

## 2023-06-23 MED ORDER — PANTOPRAZOLE SODIUM 40 MG PO TBEC
40.0000 mg | DELAYED_RELEASE_TABLET | Freq: Every day | ORAL | 0 refills | Status: DC
  Filled 2023-06-23: qty 30, 30d supply, fill #0

## 2023-06-23 MED ORDER — SENNOSIDES-DOCUSATE SODIUM 8.6-50 MG PO TABS
2.0000 | ORAL_TABLET | Freq: Two times a day (BID) | ORAL | 0 refills | Status: DC
  Filled 2023-06-23: qty 120, 30d supply, fill #0

## 2023-06-23 MED ORDER — ASPIRIN 81 MG PO TBEC
81.0000 mg | DELAYED_RELEASE_TABLET | Freq: Every day | ORAL | 0 refills | Status: DC
  Filled 2023-06-23: qty 30, 30d supply, fill #0

## 2023-06-23 MED ORDER — ATORVASTATIN CALCIUM 20 MG PO TABS
20.0000 mg | ORAL_TABLET | Freq: Every day | ORAL | 0 refills | Status: DC
  Filled 2023-06-23: qty 30, 30d supply, fill #0

## 2023-06-23 MED ORDER — DICLOFENAC SODIUM 1 % TD GEL
2.0000 g | Freq: Three times a day (TID) | TRANSDERMAL | Status: AC | PRN
Start: 2023-06-23 — End: ?

## 2023-06-23 MED ORDER — MELATONIN 5 MG PO TABS
5.0000 mg | ORAL_TABLET | Freq: Every evening | ORAL | 0 refills | Status: DC | PRN
  Filled 2023-06-23: qty 30, 30d supply, fill #0

## 2023-06-23 NOTE — Progress Notes (Signed)
 Physical Therapy Discharge Summary  Patient Details  Name: Tara Martin MRN: [id number] Date of Birth: August 30, 1940  Date of Discharge from PT service:June 23, 2023  Patient has met 7 of 9 long term goals due to improved activity tolerance, improved balance, improved attention, improved awareness, and improved coordination.  Patient to discharge at an ambulatory level  CGA .   Patient's care partner is independent to provide the necessary physical assistance at discharge.  Reasons goals not met: Pt d/c earlier than set date. Husband aware that pt requires CGA at this time due to poor spatial awareness and mild imbalances, which can be provided by further assistance at home per husband report.   Recommendation:  No further PT recommended at this time. Pt will receive hospice care at home.  Equipment: No equipment provided  Reasons for discharge: treatment goals met  Patient/family agrees with progress made and goals achieved: Yes  PT Discharge Precautions/Restrictions Precautions Precautions: Fall Recall of Precautions/Restrictions: Impaired Precaution/Restrictions Comments: Fall, R hemi, R inattention, R visual field neglect (slightly improved since evaluation) and decreased spatial awareness Restrictions Weight Bearing Restrictions Per Provider Order: No Pain Pain Assessment Pain Scale: 0-10 Pain Score: 0-No pain Pain Interference Pain Interference Pain Effect on Sleep: 0. Does not apply - I have not had any pain or hurting in the past 5 days Pain Interference with Therapy Activities: 1. Rarely or not at all Pain Interference with Day-to-Day Activities: 1. Rarely or not at all Vision/Perception  Vision - History Ability to See in Adequate Light: 3 Highly impaired Vision - Assessment Ocular Range of Motion: Restricted on the right;Restricted on the left;Restricted looking up;Restricted looking down Alignment/Gaze Preference: Gaze left Tracking/Visual Pursuits: Requires  cues, head turns, or add eye shifts to track;Unable to hold eye position out of midline;Decreased smoothness of horizontal tracking;Decreased smoothness of vertical tracking Additional Comments: Pt appears to have visual impairments in B eyes Perception Perception: Impaired Preception Impairment Details: Inattention/Neglect;Spatial orientation Praxis Praxis: Impaired Praxis Impairment Details: Ideomotor;Motor planning;Organization;Limb apraxia  Cognition Overall Cognitive Status: Impaired/Different from baseline Arousal/Alertness: Awake/alert Orientation Level: Oriented to person;Oriented to place;Oriented to situation Year: 2025 (w/ mod A) Month: April (w/ modA) Day of Week: Correct (w/ modA) Sustained Attention: Impaired Sustained Attention Impairment: Verbal basic;Functional basic Selective Attention: Impaired Selective Attention Impairment: Verbal basic;Functional basic Memory: Impaired Memory Impairment: Decreased recall of new information;Decreased short term memory;Storage deficit;Decreased long term memory;Retrieval deficit Decreased Long Term Memory: Verbal basic;Functional basic Decreased Short Term Memory: Verbal basic;Functional basic Awareness: Impaired Awareness Impairment: Intellectual impairment Problem Solving: Impaired Problem Solving Impairment: Verbal basic;Functional basic Self Correcting: Impaired Safety/Judgment: Impaired Sensation Sensation Light Touch: Impaired by gross assessment Hot/Cold: Appears Intact Proprioception: Impaired by gross assessment Stereognosis: Impaired by gross assessment Coordination Gross Motor Movements are Fluid and Coordinated: No Fine Motor Movements are Fluid and Coordinated: No Coordination and Movement Description: Gross motor movements decreased coordination due to poor spatial awareness and proprioception Motor  Motor Motor: Hemiplegia;Motor impersistence Motor - Discharge Observations: R hemiparesis with decreased  weight shift to L that has improved since evaluation. Decreased coordination when stepping over objects that is not consistent  Mobility Bed Mobility Bed Mobility: Rolling Right;Rolling Left;Supine to Sit Rolling Right: Supervision/verbal cueing Rolling Left: Supervision/Verbal cueing Supine to Sit: Supervision/Verbal cueing Transfers Transfers: Sit to Stand;Stand to Sit;Stand Pivot Transfers Sit to Stand: Supervision/Verbal cueing Stand to Sit: Supervision/Verbal cueing Stand Pivot Transfers: Supervision/Verbal cueing Transfer (Assistive device): None Locomotion  Gait Ambulation: Yes Gait Assistance: Contact Guard/Touching assist Gait Distance (  Feet): 400 Feet Assistive device: None Gait Gait: Yes Gait Pattern: Impaired Gait Pattern: Step-through pattern;Poor foot clearance - right Gait velocity: decreased Stairs / Additional Locomotion Stairs: Yes Stairs Assistance: Contact Guard/Touching assist;Supervision/Verbal cueing Stair Management Technique: One rail Left Number of Stairs: 2 Height of Stairs: 6 Wheelchair Mobility Wheelchair Mobility: No  Trunk/Postural Assessment  Cervical Assessment Cervical Assessment: Within Functional Limits Thoracic Assessment Thoracic Assessment: Within Functional Limits Lumbar Assessment Lumbar Assessment: Within Functional Limits Postural Control Trunk Control: improved since evaluation (slight R lean with fatigue)  Balance Balance Balance Assessed: Yes Standardized Balance Assessment Standardized Balance Assessment: Berg Balance Test Berg Balance Test Sit to Stand: Able to stand  independently using hands Standing Unsupported: Able to stand 2 minutes with supervision Sitting with Back Unsupported but Feet Supported on Floor or Stool: Able to sit safely and securely 2 minutes Stand to Sit: Sits safely with minimal use of hands Transfers: Able to transfer with verbal cueing and /or supervision Standing Unsupported with Eyes Closed:  Able to stand 10 seconds with supervision Standing Ubsupported with Feet Together: Needs help to attain position but able to stand for 30 seconds with feet together From Standing, Reach Forward with Outstretched Arm: Reaches forward but needs supervision From Standing Position, Pick up Object from Floor: Able to pick up shoe, needs supervision From Standing Position, Turn to Look Behind Over each Shoulder: Looks behind one side only/other side shows less weight shift Turn 360 Degrees: Needs close supervision or verbal cueing Standing Unsupported, Alternately Place Feet on Step/Stool: Able to complete >2 steps/needs minimal assist Standing Unsupported, One Foot in Front: Needs help to step but can hold 15 seconds Standing on One Leg: Able to lift leg independently and hold equal to or more than 3 seconds Total Score: 32 Static Sitting Balance Static Sitting - Balance Support: Feet supported;No upper extremity supported Static Sitting - Level of Assistance: 7: Independent Dynamic Sitting Balance Dynamic Sitting - Balance Support: Feet supported;During functional activity Dynamic Sitting - Level of Assistance: 5: Stand by assistance Dynamic Sitting - Balance Activities: Reaching across midline Static Standing Balance Static Standing - Balance Support: No upper extremity supported Static Standing - Level of Assistance: 5: Stand by assistance Dynamic Standing Balance Dynamic Standing - Level of Assistance: 5: Stand by assistance Dynamic Standing - Balance Activities: Reaching across midline Extremity Assessment  RUE Assessment RUE Assessment: Within Functional Limits General Strength Comments: WFL- pt has functional strength but is not able to intergrate hand into function due to apraxia LUE Assessment LUE Assessment: Within Functional Limits RLE Assessment RLE Assessment: Exceptions to [facility] General Strength Comments: Grossly 3+/5 LLE Assessment LLE Assessment: Exceptions to [facility] General  Strength Comments: Grossly 3+/5   Dominic A Mikael Albright, PT, DPT  06/23/2023, 3:34 PM

## 2023-06-23 NOTE — Progress Notes (Signed)
 Occupational Therapy Session Note  Patient Details  Name: Tara Martin MRN: [id number] Date of Birth: 02-06-1941  Today's Date: 06/23/2023 OT Individual Time: 1104-1200 OT Individual Time Calculation (min): 56 min    Short Term Goals: Week 2:  OT Short Term Goal 1 (Week 2): STGs = LTGs  Skilled Therapeutic Interventions/Progress Updates:    Pt received in bed and agreeable to working on donning socks and shoes.  Pt sat to EOB and was able to don items but it took her considerable time and cues to orient socks, shoes, problem solve set up of clothing.   Pt engaged in taking medication with her nurse present.  Pt dropped one pill but was able to retrieve it from her lap.   Pt stood and ambulated to recliner.   Called her spouse to give him an idea about the type of cuing and assist she will need at home due to her visual perceptual deficits and R hemiplegia. Pt resting in recliner with all needs met. Alarm belt on.    Therapy Documentation Precautions:  Precautions Precautions: Fall Recall of Precautions/Restrictions: Impaired Precaution/Restrictions Comments: Fall, R hemi, R inattention, R visual field neglect (slightly improved since evaluation) and decreased spatial awareness Restrictions Weight Bearing Restrictions Per Provider Order: No      Pain:  No c/o pain  ADL: ADL Eating: Supervision/safety Grooming: Minimal assistance Where Assessed-Grooming: Standing at sink Upper Body Bathing: Minimal cueing, Supervision/safety Where Assessed-Upper Body Bathing: Shower Lower Body Bathing: Supervision/safety, Minimal cueing Where Assessed-Lower Body Bathing: Shower Upper Body Dressing: Minimal assistance, Moderate cueing Lower Body Dressing: Minimal assistance, Moderate cueing Toileting: Supervision/safety, Moderate assistance Where Assessed-Toileting: Teacher, adult education: Close supervision Statistician Method: Event organiser: Close  supervision Film/video editor Method: Designer, industrial/product: Emergency planning/management officer, Grab bars   Therapy/Group: Individual Therapy  Davon Folta 06/23/2023, 12:58 PM

## 2023-06-23 NOTE — Plan of Care (Signed)
  Problem: RH Ambulation Goal: LTG Patient will ambulate in controlled environment (PT) Description: LTG: Patient will ambulate in a controlled environment, # of feet with assistance (PT). Outcome: Adequate for Discharge Goal: LTG Patient will ambulate in home environment (PT) Description: LTG: Patient will ambulate in home environment, # of feet with assistance (PT). Outcome: Adequate for Discharge

## 2023-06-23 NOTE — Progress Notes (Signed)
 Physical Therapy Session Note  Patient Details  Name: Tara Martin MRN: [id number] Date of Birth: 1940-06-24  Today's Date: 06/23/2023 PT Individual Time: 1402-1510 PT Individual Time Calculation (min): 68 min   Short Term Goals: Week 1:  PT Short Term Goal 1 (Week 1): pt will perform stand pivot transfer with LRAD and CGA PT Short Term Goal 2 (Week 1): pt will ambulate 150 feet with LRAD and CGA PT Short Term Goal 3 (Week 1): pt will navigate 5 steps with B HR and CGA  Skilled Therapeutic Interventions/Progress Updates: Patient sitting in recliner with husband present on entrance to room. Patient alert and agreeable to PT session.   Patient reported no pain. Pt and pt husband updated that pt did not reach supervision goal for ambulation, and will need CGA for such, along with stairs due to poor spatial awareness and mild imbalances at times. Pt and husband understanding. Pt husband stated they will have support upon d/c with family and friends, along with hospice care taking over. Pt husband asked PTA to look at pt's leg length discrepancy. Pt noted to have close to half inch difference with R LE longer than L, which pt husband said would make sense due to history report of pt occasionally dragging R LE on floor previously.  Pt required VC throughout session to walk in cued direction, often times getting turned around and needing redirection.  Pt and husband with no further concerns or questions, and thankful for pt's progress and care provided during inpatient stay.   Therapeutic Activity: Transfers: Pt performed sit<>stand transfers throughout session with no AD and with supervision. Provided VC to ensure back of knees touching surface prior to sitting.  - Pt required use of toilet back in room to have BM. Pt ambulated from main gym<room where female NT assisted per pt request. Upon exiting bathroom, pt cued to wash hands with VC to where hand soap and paper towels are located.   Gait  Training:  Pt ambulated 400'+ throughout session, including on non-compliant surfaces outside of [facility] and ascending/descending sidewalk using no AD with CGA and VC to increase awareness to objects close by, or edge of sidewalk. Pt with improved weight shift to L, leading to increase R LE clearance (still occasionally catching floor).   Patient demonstrates increased fall risk as noted by score of 32/56 on Berg Balance Scale.  (<36= high risk for falls, close to 100%; 37-45 significant >80%; 46-51 moderate >50%; 52-55 lower >25%)  Stair Navigation: Ascended/descended 12 (6") steps with L HR and mostly close supervision and few moments of CGA for safety due to pt's poor spatial awareness. Pt with both step to and reciprocal step pattern.  Patient sitting in recliner at end of session with brakes locked, husband present, belt alarm set, and all needs within reach.      Therapy Documentation Precautions:  Precautions Precautions: Fall Recall of Precautions/Restrictions: Impaired Precaution/Restrictions Comments: Fall, R hemi, R inattention, R visual field neglect (slightly improved since evaluation) and decreased spatial awareness Restrictions Weight Bearing Restrictions Per Provider Order: No  Therapy/Group: Individual Therapy  Braylen Denunzio PTA 06/23/2023, 4:02 PM

## 2023-06-23 NOTE — Plan of Care (Signed)
  Problem: RH Balance Goal: LTG: Patient will maintain dynamic sitting balance (OT) Description: LTG:  Patient will maintain dynamic sitting balance with assistance during activities of daily living (OT) Outcome: Completed/Met Goal: LTG Patient will maintain dynamic standing with ADLs (OT) Description: LTG:  Patient will maintain dynamic standing balance with assist during activities of daily living (OT)  Outcome: Completed/Met   Problem: Sit to Stand Goal: LTG:  Patient will perform sit to stand in prep for activites of daily living with assistance level (OT) Description: LTG:  Patient will perform sit to stand in prep for activites of daily living with assistance level (OT) Outcome: Completed/Met Flowsheets (Taken 06/23/2023 1301) LTG: PT will perform sit to stand in prep for activites of daily living with assistance level: Supervision/Verbal cueing   Problem: RH Eating Goal: LTG Patient will perform eating w/assist, cues/equip (OT) Description: LTG: Patient will perform eating with assist, with/without cues using equipment (OT) Outcome: Completed/Met   Problem: RH Grooming Goal: LTG Patient will perform grooming w/assist,cues/equip (OT) Description: LTG: Patient will perform grooming with assist, with/without cues using equipment (OT) Outcome: Adequate for Discharge   Problem: RH Bathing Goal: LTG Patient will bathe all body parts with assist levels (OT) Description: LTG: Patient will bathe all body parts with assist levels (OT) Outcome: Completed/Met   Problem: RH Dressing Goal: LTG Patient will perform upper body dressing (OT) Description: LTG Patient will perform upper body dressing with assist, with/without cues (OT). Outcome: Completed/Met Goal: LTG Patient will perform lower body dressing w/assist (OT) Description: LTG: Patient will perform lower body dressing with assist, with/without cues in positioning using equipment (OT) Outcome: Completed/Met   Problem: RH  Toileting Goal: LTG Patient will perform toileting task (3/3 steps) with assistance level (OT) Description: LTG: Patient will perform toileting task (3/3 steps) with assistance level (OT)  Outcome: Completed/Met   Problem: RH Vision Goal: RH LTG Vision (Specify) Outcome: Completed/Met   Problem: RH Functional Use of Upper Extremity Goal: LTG Patient will use RT/LT upper extremity as a (OT) Description: LTG: Patient will use right/left upper extremity as a stabilizer/gross assist/diminished/nondominant/dominant level with assist, with/without cues during functional activity (OT) Outcome: Completed/Met   Problem: RH Toilet Transfers Goal: LTG Patient will perform toilet transfers w/assist (OT) Description: LTG: Patient will perform toilet transfers with assist, with/without cues using equipment (OT) Outcome: Completed/Met   Problem: RH Tub/Shower Transfers Goal: LTG Patient will perform tub/shower transfers w/assist (OT) Description: LTG: Patient will perform tub/shower transfers with assist, with/without cues using equipment (OT) Outcome: Adequate for Discharge

## 2023-06-23 NOTE — Progress Notes (Signed)
 Speech Language Pathology Discharge Summary  Patient Details  Name: Tara Martin MRN: [id number] Date of Birth: [birth year]  Date of Discharge from SLP service:June 23, 2023  Patient has met 1 of 3 long term goals.  Patient to discharge at overall Mod;Max level.  Reasons goals not met: cont to require maxA for memory and awareness   Clinical Impression/Discharge Summary:  Pt has made fair gains and has met 1 of 3 LTG's this admission due to improved orientation. Pt is currently an overall mod-maxA for cognitive tasks including short term memory, sustained attention and awareness. Patient did not meet memory or awareness goal this admission as patient is going home on hospice prior to planned d/c.  Pt/family education complete and pt will discharge home with 24 hour supervision from friends/family/etc. Pt would benefit from [facility] f/u ST services to maximize cognition in order to maximize functional independence.   Care Partner:  Caregiver Able to Provide Assistance: Yes  Type of Caregiver Assistance: Physical;Cognitive  Recommendation:  Home Health SLP;24 hour supervision/assistance  Rationale for SLP Follow Up: Maximize cognitive function and independence   Equipment: n/a   Reasons for discharge: Discharged from hospital   Patient/Family Agrees with Progress Made and Goals Achieved: Yes    Azuree Minish M.A., CCC-SLP 06/23/2023, 12:52 PM

## 2023-06-23 NOTE — Progress Notes (Signed)
 Occupational Therapy Discharge Summary  Patient Details  Name: Tara Martin MRN: [id number] Date of Birth: [birth year]  Date of Discharge from OT service:June 23, 2023   Patient has met 11 of 13 long term goals due to improved activity tolerance, improved balance, postural control, and improved coordination.  Patient to discharge at overall Supervision level with moderate cues to compensate for apraxia and visual perception deficits.  Patient's care partner is independent to provide the necessary cognitive assistance at discharge.  Spouse has hired caregivers to assist with physical needs of pt. Family education with spouse via a phone call.   Reasons goals not met: she continues to need min A with grooming and shower transfers vs supervision  Recommendation:  No further OT recommended at this time. Pt will be receiving hospice services at home.    Equipment: No equipment provided  Reasons for discharge: treatment goals met  Patient/family agrees with progress made and goals achieved: Yes  OT Discharge Precautions/Restrictions  Precautions Precautions: Fall Recall of Precautions/Restrictions: Impaired Precaution/Restrictions Comments: Fall, R hemi, R inattention, R visual field neglect (slightly improved since evaluation) and decreased spatial awareness Restrictions Weight Bearing Restrictions Per Provider Order: No   ADL ADL Eating: Supervision/safety Grooming: Minimal assistance Where Assessed-Grooming: Standing at sink Upper Body Bathing: Minimal cueing, Supervision/safety Where Assessed-Upper Body Bathing: Shower Lower Body Bathing: Supervision/safety, Minimal cueing Where Assessed-Lower Body Bathing: Shower Upper Body Dressing: Minimal assistance, Moderate cueing Lower Body Dressing: Minimal assistance, Moderate cueing Toileting: Supervision/safety, Moderate cueing Where Assessed-Toileting: Teacher, adult education: Close supervision Toilet Transfer Method:  Event organiser: Contact guard, Close supervision Film/video editor Method: Designer, industrial/product: Emergency planning/management officer, Grab bars Vision Baseline Vision/History: 0 No visual deficits Patient Visual Report: No change from baseline Vision Assessment?: Yes Ocular Range of Motion: Restricted on the right;Restricted on the left;Restricted looking up;Restricted looking down Alignment/Gaze Preference: Gaze left Tracking/Visual Pursuits: Requires cues, head turns, or add eye shifts to track;Unable to hold eye position out of midline;Decreased smoothness of horizontal tracking;Decreased smoothness of vertical tracking Visual Fields: Right homonymous hemianopsia;Right visual field deficit;Left visual field deficit Additional Comments: Pt appears to have visual impairments in B eyes Perception  Perception: Impaired Praxis Praxis: Impaired Praxis Impairment Details: Ideomotor;Motor planning;Organization;Limb apraxia Cognition Cognition Overall Cognitive Status: Impaired/Different from baseline Arousal/Alertness: Awake/alert Orientation Level: Person;Place;Situation Person: Oriented Place: Oriented ("I am in the hospital") Situation: Oriented ("I had a stroke") Memory: Impaired Memory Impairment: Decreased recall of new information;Decreased short term memory;Storage deficit;Decreased long term memory;Retrieval deficit Decreased Long Term Memory: Verbal basic;Functional basic Decreased Short Term Memory: Verbal basic;Functional basic Sustained Attention: Impaired Sustained Attention Impairment: Verbal basic;Functional basic Selective Attention: Impaired Selective Attention Impairment: Verbal basic;Functional basic Awareness: Impaired Awareness Impairment: Intellectual impairment Problem Solving: Impaired Problem Solving Impairment: Verbal basic;Functional basic Safety/Judgment: Impaired Brief Interview for Mental Status (BIMS) Repetition of Three Words  (First Attempt): 3 Temporal Orientation: Year: Missed by 1 year Temporal Orientation: Month: Missed by 6 days to 1 month Temporal Orientation: Day: Incorrect Recall: "Sock": No, could not recall Recall: "Blue": No, could not recall Recall: "Bed": No, could not recall BIMS Summary Score: 6 Sensation Sensation Light Touch: Impaired by gross assessment Hot/Cold: Appears Intact Proprioception: Impaired by gross assessment Stereognosis: Impaired by gross assessment Coordination Gross Motor Movements are Fluid and Coordinated: No Fine Motor Movements are Fluid and Coordinated: No Coordination and Movement Description: FMC fluctuated, she can open small containers with B hands but has difficulty with Bilateral integration Motor  Motor Motor:  Hemiplegia;Motor impersistence Motor - Discharge Observations: R hemiparesis with decreased weight shift to L that has improved since evaluation. Decreased coordination when stepping over objects that is not consistent Mobility  Bed Mobility Bed Mobility: Rolling Right;Rolling Left;Supine to Sit Rolling Right: Supervision/verbal cueing Rolling Left: Supervision/Verbal cueing Supine to Sit: Supervision/Verbal cueing Transfers Sit to Stand: Supervision/Verbal cueing Stand to Sit: Supervision/Verbal cueing  Balance Dynamic Sitting Balance Dynamic Sitting - Level of Assistance: 5: Stand by assistance Static Standing Balance Static Standing - Level of Assistance: 5: Stand by assistance Dynamic Standing Balance Dynamic Standing - Level of Assistance: 5: Stand by assistance Extremity/Trunk Assessment RUE Assessment RUE Assessment: Within Functional Limits General Strength Comments: WFL- pt has functional strength but is not able to intergrate hand into function due to apraxia LUE Assessment LUE Assessment: Within Functional Limits   Reign Bartnick 06/23/2023, 1:12 PM

## 2023-06-23 NOTE — Progress Notes (Signed)
 Inpatient Rehabilitation Discharge Medication Review by a Pharmacist  A complete drug regimen review was completed for this patient to identify any potential clinically significant medication issues.  High Risk Drug Classes Is patient taking? Indication by Medication  Antipsychotic No   Anticoagulant Yes apixaban  - PE treatment  Antibiotic No   Opioid No   Antiplatelet Yes Aspirin  - CVA treatment/ppx  Hypoglycemics/insulin No   Vasoactive Medication No   Chemotherapy No   Other Yes Donepezil - dementia Alprazolam - anxiety  Cyanocobalamin , MVI with minerals- supplement  Lipitor- HLD Pantoprazole - GI protection/reflux  Melatonin- sleep  Senokot-S, PEG- constipation      Type of Medication Issue Identified Description of Issue Recommendation(s)  Drug Interaction(s) (clinically significant)     Duplicate Therapy     Allergy     No Medication Administration End Date     Incorrect Dose     Additional Drug Therapy Needed     Significant med changes from prior encounter (inform family/care partners about these prior to discharge).    Other        Clinically significant medication issues were identified that warrant physician communication and completion of prescribed/recommended actions by midnight of the next day:  No  Name of provider notified for urgent issues identified: N/a  Provider Method of Notification: N/a  Time spent performing this drug regimen review (minutes):  20 minutes   Chrystie Crass, PharmD Clinical Pharmacist  06/23/2023 12:36 PM

## 2023-06-23 NOTE — Progress Notes (Signed)
 Speech Language Pathology Daily Session Note  Patient Details  Name: Tara Martin MRN: [id number] Date of Birth: 03-08-1941  Today's Date: 06/23/2023 SLP Individual Time: 4098-1191 SLP Individual Time Calculation (min): 44 min  Short Term Goals: Week 2: SLP Short Term Goal 1 (Week 2): STG = LTG due to ELOS  Skilled Therapeutic Interventions: Skilled therapy session focused on cognitive goals. Upon entrance, patient upset regarding diaherra episode this AM. SLP aided patient in calling husband to reduce anxiety. SLP targeted cognitive goals by providing mod-maxA in aiding patient to review therapy schedule and scan page from L to R. Patient oriented to name independently, though required modA to orient to location. Patient with significant anxiety throughout the session requiring consistent encouragement from SLP. Patient perseverative on wanting to go home. SLP educated patient on importance of participating in therapy and acknowledging need for targeting attention and memory. Patient in agreement. Patient left in bed with alarm set and call bell in reach. Continue POC.    Pain None reported   Therapy/Group: Individual Therapy  [name] M.A., CCC-SLP 06/23/2023, 7:28 AM

## 2023-06-23 NOTE — Progress Notes (Signed)
 PROGRESS NOTE   Subjective/Complaints:  Pt thought I was therapist, no c/o today , states she would like to go home when possible.  Appreciate palliative care consult Comfort care with DNR-limited selected by husband in consultation with palliative/hospice Reviewed med list which consists primarily of prn meds as well as Aricept, Lipitor and Protonix, , will not discharge on Lovenox  Husband prefers to go back to Eliquis given administration and monitoring difficulties with alternative anticoagulants  ROS-negative chest pain, shortness of breath, abd pain, nausea, vomiting, diarrhea  Objective:   No results found. Recent Labs    06/23/23 0523  WBC 5.5  HGB 8.7*  HCT 28.2*  PLT 292    Recent Labs    06/21/23 0541 06/23/23 0523  NA  --  138  K  --  3.8  CL  --  110  CO2  --  21*  GLUCOSE  --  115*  BUN  --  26*  CREATININE 1.08* 1.02*  CALCIUM  --  9.1     Intake/Output Summary (Last 24 hours) at 06/23/2023 0929 Last data filed at 06/22/2023 1800 Gross per 24 hour  Intake 300 ml  Output --  Net 300 ml        Physical Exam: Vital Signs Blood pressure (!) 146/60, pulse 64, temperature 98.4 F (36.9 C), temperature source Oral, resp. rate 18, height 5\' 5"  (1.651 m), weight 59.4 kg, SpO2 100%.   General: No acute distress, resting in bed Mood and affect appropriate today Heart: Regular rate and rhythm no rubs murmurs or extra sounds Lungs: Clear to auscultation, breathing unlabored, no rales or wheezes Abdomen: Positive bowel sounds, soft nontender to palpation, nondistended Extremities: No clubbing, cyanosis, or edema Skin: No evidence of breakdown, no evidence of rash over exposed surfaces Neurologic: Cranial nerves II through XII intact, motor strength is 5/5 in bilateral deltoid, bicep, tricep, grip, hip flexor, knee extensors, ankle dorsiflexor and plantar flexor  Right homonymous  hemianopsia-  Oriented to person and place but not time  Musculoskeletal: Full range of motion in all 4 extremities. No joint swelling   Assessment/Plan: 1. Functional deficits which require 3+ hours per day of interdisciplinary therapy in a comprehensive inpatient rehab setting. Physiatrist is providing close team supervision and 24 hour management of active medical problems listed below. Physiatrist and rehab team continue to assess barriers to discharge/monitor patient progress toward functional and medical goals  Care Tool:  Bathing    Body parts bathed by patient: Right arm, Left arm, Chest, Abdomen, Right upper leg, Buttocks, Front perineal area, Left upper leg, Right lower leg, Left lower leg, Face         Bathing assist Assist Level: Contact Guard/Touching assist     Upper Body Dressing/Undressing Upper body dressing   What is the patient wearing?: Pull over shirt    Upper body assist Assist Level: Minimal Assistance - Patient > 75%    Lower Body Dressing/Undressing Lower body dressing      What is the patient wearing?: Underwear/pull up, Pants     Lower body assist Assist for lower body dressing: Minimal Assistance - Patient > 75%     Toileting Toileting  Toileting assist Assist for toileting: Contact Guard/Touching assist     Transfers Chair/bed transfer  Transfers assist     Chair/bed transfer assist level: Minimal Assistance - Patient > 75%     Locomotion Ambulation   Ambulation assist      Assist level: Minimal Assistance - Patient > 75% Assistive device: No Device Max distance: 300+   Walk 10 feet activity   Assist     Assist level: Minimal Assistance - Patient > 75% Assistive device: No Device, Hand held assist   Walk 50 feet activity   Assist    Assist level: Minimal Assistance - Patient > 75% Assistive device: No Device, Hand held assist    Walk 150 feet activity   Assist    Assist level: Minimal Assistance -  Patient > 75% Assistive device: No Device, Hand held assist    Walk 10 feet on uneven surface  activity   Assist     Assist level: Minimal Assistance - Patient > 75% Assistive device: Hand held assist   Wheelchair     Assist Is the patient using a wheelchair?: Yes Type of Wheelchair: Manual    Wheelchair assist level: Dependent - Patient 0%      Wheelchair 50 feet with 2 turns activity    Assist        Assist Level: Dependent - Patient 0%   Wheelchair 150 feet activity     Assist      Assist Level: Dependent - Patient 0%   Blood pressure (!) 146/60, pulse 64, temperature 98.4 F (36.9 C), temperature source Oral, resp. rate 18, height 5\' 5"  (1.651 m), weight 59.4 kg, SpO2 100%.  Medical Problem List and Plan: 1. 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  Right homonymous hemianopsia and sensory ataxia UE and LE              -patient may  shower-  -ELOS/Goals: 12-18 days supervision due to poor memory and reduced attn to RIght side , husband is legally blind , may benefit from AL level of care - team conf in am  Will qualify for hospice eval (probably not inpt yet), consult written              -CIR PT, OT, SLP  Hopefully can set up Hospice for D/C tomorrow No PMR f/u needed  2.  Antithrombotics: -DVT/anticoagulation:  Pharmaceutical: Lovenox 60mg  BID- will d/c after tomorrow am and husband will restart Eliquis tomorrow             -antiplatelet therapy: ASA 81mg  daily 3. Pain Management: Tylenol prn for pain.  4. Mood/Behavior/Sleep: LCSW to follow for evaluation and support.              -antipsychotic agents: N/A  -melatonin PRN, xanax 0.5mg  daily PRN 5. Neuropsych/cognition: This patient is not fully capable of making decisions on of own behalf. 6. Skin/Wound Care: Routine pressure relief measures.  7. Fluids/Electrolytes/Nutrition: Monitor intake. Routine  labs, continue supplements 8. PE/DVT: probable hyper coag state due to adenocarcinoma To start treatment dose of lovenox R>L PE onset in January 2025 9. Moderate degree dementia: just dx'd Advancement past PE/hypoxia per 05/2023 visit.  --Now back on Aricept 10mg  QHS -Recent memory impaired orientation impaired, mild irritability but no agitation thus far. 10. Bronch/Biopsy of 4.4 x 2+ cm R middle lung nodule that's suspicious for cancer, cytology revealed adenocarcinoma,Hospice care recommended and husband  elects to pursue this  11. HLD- con't atorvastatin 20mg  daily 12. Constipation:  LBM 4/13- on colace may need supp if no BM by tomorrow , change colace to senna S 13. GERD/GI ppx: protonix 40mg  daily 14. Anemia:   -slightly down will check stool OB since pt on anticoag    Latest Ref Rng & Units 06/23/2023    5:23 AM 06/20/2023    5:38 AM 06/15/2023    5:18 AM  CBC  WBC 4.0 - 10.5 K/uL 5.5  4.7  5.3   Hemoglobin 12.0 - 15.0 g/dL 8.7  8.7  9.6   Hematocrit 36.0 - 46.0 % 28.2  27.8  30.5   Platelets 150 - 400 K/uL 292  307  262      LOS: 9 days A FACE TO FACE EVALUATION WAS PERFORMED  Tara Martin 06/23/2023, 9:29 AM

## 2023-06-23 NOTE — Progress Notes (Signed)
 [facility] [phone] [facility] Liaison Note  Received request from [facility] for hospice services at home after discharge. Spoke with patient's husband to initiate education related to hospice philosophy, services and team approach to care. Husband verbalized understanding of information given. Per discussion, the plan is for discharge home tomorrow.   DME needs discussed. Patient has the following equipment in the home: [facility], shower seat. Family requests the following equipment for delivery: none.  Please send signed and completed DNR home with patient/family. Please provide prescriptions at discharge as needed to ensure ongoing symptom management.  [facility] information given to husband. Please call with any concerns.  Thank you for the opportunity to participate in this patient's care.   Ardine Beckwith, LPN [facility] Liaison [phone]

## 2023-06-23 NOTE — Progress Notes (Addendum)
 Patient ID: Tara Martin, female   DOB: September 07, 1940, 83 y.o.   MRN: [id number]  SW spoke with pt husband confirming plan for hospice at home, and would like for his wife ot leave tomorrow. SW informed will make an effort, however, will have to coordinate the care and she has to be approved for hospice. No preferred hospice agency. He is aware SW will call his daughter to discuss care needs as well. He has been encouraged to contact his LTC policy to ensure no preferred HHA to use and process for using agency. He is aware the LTC has waived the normal waiting period.   1026- SW left message for his dtr Loetta Ringer requesting return phone call.   1028- SW spoke with Shawn/[facility] to discuss reviewing patient chart to determine if pt is appropriate for hospice. SW waiting on follow-up.   SW received updates from [facility] reporting pt meets criteria for hospice and will confirm tomorrow after meeting at 1pm. SW with hospice can provide a list of PCS agencies to help assist him with finding a homecare agencies.   1232- SW spoke with pt husband to discuss that pt meets criteria for hospice and will likely be accepted for services. Pt will have assessment tomorrow at 1pm. When discussing discharge plan, he reports he has a friend that wil bring him here to the hospital tomorrow at 10am and help them get into the home. He has a friend that was an EMT and has offered to stay as long as needed to help assist them.   SW updated medical team.   1246- SW made contact with pt dtr Kelli to give above details. She reports she will follow-up with her father, and her plan is to leave [place] ASAP as she told her father she would drive down as soon as she was notified of mother's discharge.   Norval Been, MSW, LCSW Office: [phone] Cell: [phone] Fax: [phone]

## 2023-06-23 NOTE — Plan of Care (Signed)
  Problem: RH Memory Goal: LTG Patient will use memory compensatory aids to (SLP) Description: LTG:  Patient will use memory compensatory aids to recall biographical/new, daily complex information with cues (SLP) Outcome: Not Met (d/c home before planned date)   Problem: RH Awareness Goal: LTG: Patient will demonstrate awareness during functional activites type of (SLP) Description: LTG: Patient will demonstrate awareness during functional activites type of (SLP) Outcome: Not Met (d/c home before planned date)   Problem: RH Cognition - SLP Goal: RH LTG Patient will demonstrate orientation with cues Description:  LTG:  Patient will demonstrate orientation to person/place/time/situation with cues (SLP)   Outcome: Completed/Met

## 2023-06-23 NOTE — Progress Notes (Signed)
 Occupational Therapy Session Note  Patient Details  Name: Tara Martin MRN: [id number] Date of Birth: 21-May-1940  Today's Date: 06/23/2023 OT Individual Time: 1104-1200 OT Individual Time Calculation (min): 56 min    Short Term Goals: Week 2:  OT Short Term Goal 1 (Week 2): STGs = LTGs  Skilled Therapeutic Interventions/Progress Updates:  Pt greeted seated in recliner, pt agreeable to OT intervention.      Transfers/bed mobility/functional mobility:  Pt completed all functional ambulation with no AD and MINA- CGA d/t visual deficits affecting balance.  Therapeutic activity:  Pt completed various therapeutic activities focused on R sided attention, bimanual coordination, visual scanning, and motor planning:  -pt completed seated arch task with pt instructed to transport plastic rings across arch from R<>L with RUE. Pt needed MAX cues to remember to use RUE only. No coordination deficits noted when manipulating the rings however pt did forget to let go of the rings at times.  -pt completed standing task with pt instructed to fold wash cloths with a focus on bimanual coordination, pt did great with this task as it was familiar to her. Pt then able to ambulate while holding wash cloths in both hands with good awareness to affected R side.  -pt then instructed to walk while holding laundry basket of washcloths, pt noted to let R side of basket sag needing max cues to keep basket level.  -pt completed seated ball tosses to this OTA with CGA for balance, improved bilateral coordination noted, however when pt was instructed to toss ball to her self, pt presents with impaired motor planning having a hard time figuring out when to catch and release ball.  -pt completed seated peg board task with pt instructed to stack same colored pegs on top of each other with RUE. Pt with great difficult with visual perception of task when giving a directional cues such as "stack them on top of each other."     ADLs:  Grooming: pt completed standing hand hygiene with CGA but MIN verbal cues for sequencing and problem solving steps related to task.   Transfers: ambulatory transfer into bathroom with no AD and CGA, min verbal cues for location of bathroom in room.  Toileting: pt completed 3/3 toileting tasks with CGA, pt was able to cleanse herself after BM with set-up of wash cloth.    Cognition:               Pt perseverating on location of her husband asking this therapist at least 10x when he was coming back and the temperature outside.   Ended session with pt seated in recliner with all needs within reach and safety belt alarm activated.                    Therapy Documentation Precautions:  Precautions Precautions: Fall Recall of Precautions/Restrictions: Impaired Precaution/Restrictions Comments: Fall, R hemi, R inattention, R visual field neglect Restrictions Weight Bearing Restrictions Per Provider Order: No  Pain: No pain reported during session    Therapy/Group: Individual Therapy  Mollie Anger [facility] 06/23/2023, 12:21 PM

## 2023-06-24 ENCOUNTER — Other Ambulatory Visit (HOSPITAL_COMMUNITY): Payer: Self-pay

## 2023-06-24 DIAGNOSIS — I63439 Cerebral infarction due to embolism of unspecified posterior cerebral artery: Secondary | ICD-10-CM

## 2023-06-24 DIAGNOSIS — F02B0 Dementia in other diseases classified elsewhere, moderate, without behavioral disturbance, psychotic disturbance, mood disturbance, and anxiety: Secondary | ICD-10-CM

## 2023-06-24 DIAGNOSIS — G309 Alzheimer's disease, unspecified: Secondary | ICD-10-CM

## 2023-06-24 NOTE — Progress Notes (Signed)
 Inpatient Rehabilitation Care Coordinator Discharge Note   Patient Details  Name: Tara Martin MRN: [id number] Date of Birth: 1940/07/14   Discharge location: D/c to home with hospice  Length of Stay: 9 days  Discharge activity level: Supervision to Min Asst  Home/community participation: Limited  Patient response WU:JWJXBJ Literacy - How often do you need to have someone help you when you read instructions, pamphlets, or other written material from your doctor or pharmacy?: Never  Patient response YN:WGNFAO Isolation - How often do you feel lonely or isolated from those around you?: Patient unable to respond  Services provided included: MD, RD, PT, SLP, RN, CM, TR, Pharmacy, Neuropsych, SW, OT  Financial Services:  Field seismologist Utilized: [employer]  Choices offered to/list presented to: patient husband  Follow-up services arranged:  Other (Comment) ([facility] ([place] location))           Patient response to transportation need: Is the patient able to respond to transportation needs?: Yes In the past 12 months, has lack of transportation kept you from medical appointments or from getting medications?: No In the past 12 months, has lack of transportation kept you from meetings, work, or from getting things needed for daily living?: No   Patient/Family verbalized understanding of follow-up arrangements:  Yes  Individual responsible for coordination of the follow-up plan: contatct pt husband  Confirmed correct DME delivered: Rennis Case 06/24/2023    Comments (or additional information):  Summary of Stay    Date/Time Discharge Planning CSW  06/22/23 0947 Pt will have support from her husband. Dtr to assist first few days after discharge and then will return to MD. Pt has new diagnosis of cancer, and husband reports he would like to d/c to home with palliative care.  SW will confirm there are no barriers to discharge. AAC  06/21/23  1115 Pt will have support from her husband and dtr to assist. SW will confirm there are no barriers to discharge. AAC  06/15/23 0959 TBA. Per EMR, pt will have support from her husband and dtr to assist. SW will confirm there are no barriers to discharge. AAC       [name] Brendolyn Callas

## 2023-06-24 NOTE — Progress Notes (Signed)
 PROGRESS NOTE   Subjective/Complaints:  Discussed with husband that we will d/c pt home on current meds but Hospice MD will evaluate meds  Reviewed labs   ROS-negative chest pain, shortness of breath, abd pain, nausea, vomiting, diarrhea  Objective:   No results found. Recent Labs    06/23/23 0523  WBC 5.5  HGB 8.7*  HCT 28.2*  PLT 292    Recent Labs    06/23/23 0523  NA 138  K 3.8  CL 110  CO2 21*  GLUCOSE 115*  BUN 26*  CREATININE 1.02*  CALCIUM  9.1     Intake/Output Summary (Last 24 hours) at 06/24/2023 0956 Last data filed at 06/24/2023 0825 Gross per 24 hour  Intake 120 ml  Output --  Net 120 ml        Physical Exam: Vital Signs Blood pressure (!) 107/46, pulse 64, temperature 98.5 F (36.9 C), resp. rate 18, height 5' 5 (1.651 m), weight 59.4 kg, SpO2 98%.   General: No acute distress, resting in bed Mood and affect appropriate today Heart: Regular rate and rhythm no rubs murmurs or extra sounds Lungs: Clear to auscultation, breathing unlabored, no rales or wheezes Abdomen: Positive bowel sounds, soft nontender to palpation, nondistended Extremities: No clubbing, cyanosis, or edema Skin: No evidence of breakdown, no evidence of rash over exposed surfaces Neurologic: Cranial nerves II through XII intact, motor strength is 5/5 in bilateral deltoid, bicep, tricep, grip, hip flexor, knee extensors, ankle dorsiflexor and plantar flexor  Right homonymous hemianopsia-     Assessment/Plan: 1. Functional deficits d/t Left MCA infarct Stable for D/C today F/u Hospice MD See D/C summary See D/C instructions   Care Tool:  Bathing    Body parts bathed by patient: Right arm, Left arm, Chest, Abdomen, Right upper leg, Buttocks, Front perineal area, Left upper leg, Right lower leg, Left lower leg, Face         Bathing assist Assist Level: Supervision/Verbal cueing     Upper Body  Dressing/Undressing Upper body dressing   What is the patient wearing?: Pull over shirt, Bra    Upper body assist Assist Level: Minimal Assistance - Patient > 75%    Lower Body Dressing/Undressing Lower body dressing      What is the patient wearing?: Underwear/pull up, Pants     Lower body assist Assist for lower body dressing: Minimal Assistance - Patient > 75%     Toileting Toileting    Toileting assist Assist for toileting: Supervision/Verbal cueing     Transfers Chair/bed transfer  Transfers assist     Chair/bed transfer assist level: Supervision/Verbal cueing     Locomotion Ambulation   Ambulation assist      Assist level: Contact Guard/Touching assist Assistive device: No Device Max distance: 400+   Walk 10 feet activity   Assist     Assist level: Contact Guard/Touching assist Assistive device: No Device   Walk 50 feet activity   Assist    Assist level: Contact Guard/Touching assist Assistive device: No Device    Walk 150 feet activity   Assist    Assist level: Contact Guard/Touching assist Assistive device: No Device    Walk 10  feet on uneven surface  activity   Assist     Assist level: Contact Guard/Touching assist Assistive device: Other (comment) (no device)   Wheelchair     Assist Is the patient using a wheelchair?: No Type of Wheelchair: Manual    Wheelchair assist level: Dependent - Patient 0%      Wheelchair 50 feet with 2 turns activity    Assist        Assist Level: Dependent - Patient 0%   Wheelchair 150 feet activity     Assist      Assist Level: Dependent - Patient 0%   Blood pressure (!) 107/46, pulse 64, temperature 98.5 F (36.9 C), resp. rate 18, height 5' 5 (1.651 m), weight 59.4 kg, SpO2 98%.  Medical Problem List and Plan: 1. 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  Right homonymous  hemianopsia and sensory ataxia UE and LE              D/c with Hospice care today  2.  Antithrombotics: -DVT/anticoagulation:  Pharmaceutical: Lovenox  60mg  BID- will d/c after tomorrow am and husband will restart Eliquis  tomorrow             -antiplatelet therapy: ASA 81mg  daily 3. Pain Management: Tylenol  prn for pain.  4. Mood/Behavior/Sleep: LCSW to follow for evaluation and support.              -antipsychotic agents: N/A  -melatonin PRN, xanax  0.5mg  daily PRN 5. Neuropsych/cognition: This patient is not fully capable of making decisions on of own behalf. 6. Skin/Wound Care: Routine pressure relief measures.  7. Fluids/Electrolytes/Nutrition: Monitor intake. Routine labs, continue supplements 8. PE/DVT: probable hyper coag state due to adenocarcinoma To start treatment dose of lovenox  R>L PE onset in January 2025 9. Moderate degree dementia: just dx'd Advancement past PE/hypoxia per 05/2023 visit.  --Now back on Aricept  10mg  QHS -Recent memory impaired orientation impaired, mild irritability but no agitation thus far. 10. Bronch/Biopsy of 4.4 x 2+ cm R middle lung nodule that's suspicious for cancer, cytology revealed adenocarcinoma,Hospice care recommended and husband elects to pursue this  11. HLD- con't atorvastatin  20mg  daily 12. Constipation:  LBM 4/13- on colace may need supp if no BM by tomorrow , change colace to senna S 13. GERD/GI ppx: protonix  40mg  daily 14. Anemia:   -slightly down will check stool OB since pt on anticoag    Latest Ref Rng & Units 06/23/2023    5:23 AM 06/20/2023    5:38 AM 06/15/2023    5:18 AM  CBC  WBC 4.0 - 10.5 K/uL 5.5  4.7  5.3   Hemoglobin 12.0 - 15.0 g/dL 8.7  8.7  9.6   Hematocrit 36.0 - 46.0 % 28.2  27.8  30.5   Platelets 150 - 400 K/uL 292  307  262      LOS: 10 days A FACE TO FACE EVALUATION WAS PERFORMED  Prentice FORBES Compton 06/24/2023, 9:56 AM

## 2023-06-24 NOTE — Discharge Summary (Signed)
 Physician Discharge Summary  Patient ID: Tara Martin MRN: [id number] DOB/AGE: 1940-11-27 83 y.o.  Admit date: 06/14/2023 Discharge date: 06/24/2023  Discharge Diagnoses:  Principal Problem:   Embolic stroke [facility]) Active Problems:   Prediabetes   Cognitive deficits   Pulmonary embolism (HCC)   Normocytic anemia   Lung cancer (HCC)   Constipation   Discharged Condition: stable  Significant Diagnostic Studies: N/A   Labs:  Basic Metabolic Panel: Recent Labs  Lab 06/20/23 0538 06/21/23 0541 06/23/23 0523  NA 137  --  138  K 4.2  --  3.8  CL 108  --  110  CO2 22  --  21*  GLUCOSE 110*  --  115*  BUN 23  --  26*  CREATININE 1.23* 1.08* 1.02*  CALCIUM  9.0  --  9.1    CBC:    Latest Ref Rng & Units 06/23/2023    5:23 AM 06/20/2023    5:38 AM 06/15/2023    5:18 AM  CBC  WBC 4.0 - 10.5 K/uL 5.5  4.7  5.3   Hemoglobin 12.0 - 15.0 g/dL 8.7  8.7  9.6   Hematocrit 36.0 - 46.0 % 28.2  27.8  30.5   Platelets 150 - 400 K/uL 292  307  262      CBG: No results for input(s): GLUCAP in the last 168 hours.  Brief HPI:   Tara Martin is a 83 y.o. female with history of anxiety disorder, migraines, lupus, recent diagnosis of RML lung mass, DVT and bilateral PEs January 2025-on Eliquis , late onset Alzheimer's with advancement who presented to the ED on 06/08/2023 with acute onset of mental status changes, difficulty speaking and weakness.  MRI brain revealed multifocal acute ischemia left occipital, left temporal, left thalamus lobes and bilateral parietal subcortical white matter as well as petechial hemorrhage within posterior right hemisphere.  CTA head/neck showed progression of bulky metastatic lymphadenopathy in mediastinum and bilateral lower neck and thoracic and, severe stenosis left-PCA with occlusion in P3 segment concordant with acute ischemia seen on MRI.  Dr. Jerri felt the stroke concerning for cardioembolic versus hypercoagulopathic state from advanced malignancy.    She underwent bronchoscopy with reportedly positive RML mass in airway and 4 nodes in contralateral side.  Final path pending.  Therapy has been working with patient was limited by significant visual deficits with right neglect, fluent aphasia, poor awareness of deficits, right-sided weakness and was requiring min assist with ADLs and mobility.  She was fully independent prior to January admission.  CIR was recommended due to functional decline.   Hospital Course: KARRY BARRILLEAUX was admitted to rehab 06/14/2023 for inpatient therapies to consist of PT, ST and OT at least three hours five days a week. Past admission physiatrist, therapy team and rehab RN have worked together to provide customized collaborative inpatient rehab. She was maintained on Lovenox  during her stay and has tolerated this without SE. Her blood pressures were monitored on TID basis and has been stable. Constipation has been managed with use of colace. Follow up CBC showed mild drop in H/H and neutropenia has resolved. Follow up check of BMET showed improvement in pre renal azotemia. Oncology was consulted due to new diagnosis of adenocarcinoma. Dr. Tina recommended palliative care as patient not candidate for additional cancer work up and treatment likely would result in more harm. Hospice was consulted to discuss GOC and family elected on DNR as well as home with hospice as soon as possible. Husband requested discharge  to home as soon as possible as well as d/c of Lovenox  and resumption of eliquis  for ease of care.  He indicated that family and friends would be able  to assist as needed. She was discharged to home on 06/23/80    Rehab course: During patient's stay in rehab weekly team conferences were held to monitor patient's progress, set goals and discuss barriers to discharge. At admission, patient required min assist with basic ADL tasks and with mobility. She exhibited severe deficits affecting memory, recall, sustained  attention and problem solving. She was oriented to self only and difficult to redirect with MMSE score 10/30.  She  has had improvement in activity tolerance, balance, postural control as well as ability to compensate for deficits.  For ADL tasks, she requires supervision with moderate cues to compensate for apraxia and visual deficits. She require supervision with verbal cues for transfers and CGA to ambulate 400'.  She requires mod to max assist for cognitive tasks. Family education has been completed.      Discharge disposition: 06-Home-Health Care Svc  Diet: Regular  Special Instructions: Family to assist with medications.  Discharge Instructions     Ambulatory referral to Neurology   Complete by: As directed    An appointment is requested in approximately: 6-8 weeks      Allergies as of 06/24/2023       Reactions   Codeine Other (See Comments)   Per patient, this made her flip out        Medication List     STOP taking these medications    enoxaparin  60 MG/0.6ML injection Commonly known as: LOVENOX    ondansetron  4 MG tablet Commonly known as: ZOFRAN        TAKE these medications    ALPRAZolam  0.5 MG tablet Commonly known as: XANAX  Take 0.5 mg by mouth daily as needed for anxiety.   aspirin  EC 81 MG tablet Take 1 tablet (81 mg total) by mouth daily. Swallow whole.   atorvastatin  20 MG tablet Commonly known as: LIPITOR Take 1 tablet (20 mg total) by mouth daily.   cyanocobalamin  1000 MCG tablet Commonly known as: VITAMIN B12 Take 1 tablet (1,000 mcg total) by mouth daily.   diclofenac  sodium 1 % Gel Commonly known as: VOLTAREN  Apply 2 g topically 3 (three) times daily as needed (for pain- large joints). What changed:  how much to take how to take this when to take this reasons to take this additional instructions   donepezil  10 MG tablet Commonly known as: Aricept  Take 1 tablet (10 mg total) by mouth at bedtime.   Eliquis  5 MG Tabs  tablet Generic drug: apixaban  Take 5 mg by mouth 2 (two) times daily.   feeding supplement Liqd Take 237 mLs by mouth 2 (two) times daily between meals.   melatonin 5 MG Tabs Take 1 tablet (5 mg total) by mouth at bedtime as needed.   Multivitamin Women 50+ Tabs Take 1 tablet by mouth daily with breakfast.   pantoprazole  40 MG tablet Commonly known as: PROTONIX  Take 1 tablet (40 mg total) by mouth daily.   polyethylene glycol powder 17 GM/SCOOP powder Commonly known as: GLYCOLAX /MIRALAX  Take 1 capful (17 g) by mouth daily.   Refresh Optive Advanced PF 0.5-1-0.5 % Soln Generic drug: Carboxymeth-Glyc-Polysorb PF Place 1 drop into both eyes 3 (three) times daily as needed (for irritation).   senna-docusate 8.6-50 MG tablet Commonly known as: Senokot-S Take 2 tablets by mouth 2 (two) times daily.  Follow-up Information     Burchette, Wolm ORN, MD Follow up.   Specialty: Family Medicine Why: Call in 1-2 days for post hospital follow up Contact information: [address] [place] [address] [phone]         Carilyn Prentice BRAVO, MD. Call.   Specialty: Physical Medicine and Rehabilitation Why: As needed Contact information: [address] [place] [address] [phone]         Shelah Lamar RAMAN, MD Follow up.   Specialty: Pulmonary Disease Why: Call in 1-2 days for post hospital follow up Contact information: [address] [place] [address] [phone]         Dohmeier, Dedra, MD Follow up.   Specialty: Neurology Why: Call in 1-2 days for post hospital follow up Contact information: [address] [place] [address] [phone]         Tina Pauletta BROCKS, MD. Call.   Specialty: Oncology Why: As needed Contact information: [address] [place] [address] [phone]         [facility] Follow up.   Why: Will call to follow up with you at  home. Contact information: [address] [place] [address] [phone]                 Signed: Sharlet RAMAN Schmitz 06/28/2023, 11:42 PM

## 2023-06-27 NOTE — Progress Notes (Signed)
 The proposed treatment discussed in conference is for discussion purpose only and is not a binding recommendation.  The patients have not been physically examined, or presented with their treatment options.  Therefore, final treatment plans cannot be decided.

## 2023-06-28 ENCOUNTER — Ambulatory Visit: Payer: Medicare HMO | Admitting: Acute Care

## 2023-06-28 DIAGNOSIS — K5900 Constipation, unspecified: Secondary | ICD-10-CM | POA: Insufficient documentation

## 2023-06-28 DIAGNOSIS — C3490 Malignant neoplasm of unspecified part of unspecified bronchus or lung: Secondary | ICD-10-CM | POA: Insufficient documentation

## 2023-06-29 ENCOUNTER — Telehealth: Payer: Self-pay | Admitting: *Deleted

## 2023-06-29 NOTE — Telephone Encounter (Signed)
 PT's husband states PT is under hospice care. Please let [name] know, Kia.  PCC's- Please cancel and reminder calls for upcoming testing in the activities tab so the PT 's family does not get reminders. . TY.

## 2023-07-14 ENCOUNTER — Telehealth: Payer: Self-pay

## 2023-07-14 NOTE — Telephone Encounter (Signed)
 Called Tara Martin and husband answered. She is in [facility] now with an aggressive form of cancer, he said. I told him we needed to make appt w/an AP to complete long term care ppwk. He asked if we were going to send the AP to hospice. Please advise how to move fwd.

## 2023-07-14 NOTE — Telephone Encounter (Signed)
 Received long term care claim paperwork. Per RB pt needs an OV with APP to fill forms out. Routing to front staff to schedule pt appt to review paperwork needed for [employer] term care insurance company. Please advise

## 2023-07-18 NOTE — Telephone Encounter (Signed)
 I will route to Isa Manuel to advise as she is the only NP who has seen this patient.  Dr. Baldwin Levee has never seen this patient.  Isa Manuel how would you like to proceed regarding this paperwork?

## 2023-09-06 DEATH — deceased

## 2023-11-08 ENCOUNTER — Other Ambulatory Visit (HOSPITAL_COMMUNITY): Payer: Self-pay

## 2023-11-08 ENCOUNTER — Other Ambulatory Visit: Payer: Self-pay
